# Patient Record
Sex: Female | Born: 1955 | Race: White | Hispanic: No | Smoking: Former smoker
Health system: Southern US, Community
[De-identification: ages and names within clinical notes are randomized; demographics above are authoritative.]

## PROBLEM LIST (undated history)

## (undated) DIAGNOSIS — I4891 Unspecified atrial fibrillation: Secondary | ICD-10-CM

## (undated) DIAGNOSIS — D649 Anemia, unspecified: Secondary | ICD-10-CM

## (undated) DIAGNOSIS — I499 Cardiac arrhythmia, unspecified: Secondary | ICD-10-CM

## (undated) DIAGNOSIS — J189 Pneumonia, unspecified organism: Secondary | ICD-10-CM

## (undated) DIAGNOSIS — I639 Cerebral infarction, unspecified: Secondary | ICD-10-CM

## (undated) DIAGNOSIS — K2980 Duodenitis without bleeding: Secondary | ICD-10-CM

## (undated) DIAGNOSIS — D509 Iron deficiency anemia, unspecified: Secondary | ICD-10-CM

## (undated) DIAGNOSIS — I2510 Atherosclerotic heart disease of native coronary artery without angina pectoris: Secondary | ICD-10-CM

## (undated) DIAGNOSIS — K2970 Gastritis, unspecified, without bleeding: Secondary | ICD-10-CM

## (undated) DIAGNOSIS — G4730 Sleep apnea, unspecified: Secondary | ICD-10-CM

## (undated) DIAGNOSIS — R221 Localized swelling, mass and lump, neck: Secondary | ICD-10-CM

## (undated) DIAGNOSIS — I700 Atherosclerosis of aorta: Secondary | ICD-10-CM

## (undated) DIAGNOSIS — I447 Left bundle-branch block, unspecified: Secondary | ICD-10-CM

## (undated) DIAGNOSIS — M1990 Unspecified osteoarthritis, unspecified site: Secondary | ICD-10-CM

## (undated) DIAGNOSIS — E785 Hyperlipidemia, unspecified: Secondary | ICD-10-CM

## (undated) DIAGNOSIS — I10 Essential (primary) hypertension: Secondary | ICD-10-CM

## (undated) HISTORY — DX: Duodenitis without bleeding: K29.80

## (undated) HISTORY — DX: Gastritis, unspecified, without bleeding: K29.70

## (undated) HISTORY — DX: Essential (primary) hypertension: I10

## (undated) HISTORY — PX: ABDOMINAL HYSTERECTOMY: SHX81

## (undated) HISTORY — PX: OTHER SURGICAL HISTORY: SHX169

## (undated) HISTORY — PX: CORONARY ANGIOPLASTY: SHX604

## (undated) HISTORY — PX: TOTAL ABDOMINAL HYSTERECTOMY W/ BILATERAL SALPINGOOPHORECTOMY: SHX83

## (undated) HISTORY — DX: Unspecified atrial fibrillation: I48.91

## (undated) HISTORY — DX: Cerebral infarction, unspecified: I63.9

## (undated) MED FILL — Iron Sucrose Inj 20 MG/ML (Fe Equiv): INTRAVENOUS | Qty: 10 | Status: AC

---

## 2006-09-27 HISTORY — PX: CARDIAC ELECTROPHYSIOLOGY STUDY AND ABLATION: SHX1294

## 2010-09-27 DIAGNOSIS — I639 Cerebral infarction, unspecified: Secondary | ICD-10-CM

## 2010-09-27 HISTORY — DX: Cerebral infarction, unspecified: I63.9

## 2012-11-20 DIAGNOSIS — E039 Hypothyroidism, unspecified: Secondary | ICD-10-CM | POA: Diagnosis not present

## 2013-05-23 DIAGNOSIS — R7989 Other specified abnormal findings of blood chemistry: Secondary | ICD-10-CM | POA: Diagnosis not present

## 2013-05-23 DIAGNOSIS — Z79899 Other long term (current) drug therapy: Secondary | ICD-10-CM | POA: Diagnosis not present

## 2013-05-23 DIAGNOSIS — I10 Essential (primary) hypertension: Secondary | ICD-10-CM | POA: Diagnosis not present

## 2013-05-23 DIAGNOSIS — E7800 Pure hypercholesterolemia, unspecified: Secondary | ICD-10-CM | POA: Diagnosis not present

## 2013-05-23 DIAGNOSIS — I4891 Unspecified atrial fibrillation: Secondary | ICD-10-CM | POA: Diagnosis not present

## 2013-06-05 DIAGNOSIS — M545 Low back pain, unspecified: Secondary | ICD-10-CM | POA: Diagnosis not present

## 2013-06-05 DIAGNOSIS — IMO0002 Reserved for concepts with insufficient information to code with codable children: Secondary | ICD-10-CM | POA: Diagnosis not present

## 2013-06-05 DIAGNOSIS — F17200 Nicotine dependence, unspecified, uncomplicated: Secondary | ICD-10-CM | POA: Diagnosis not present

## 2013-06-12 DIAGNOSIS — N390 Urinary tract infection, site not specified: Secondary | ICD-10-CM | POA: Diagnosis not present

## 2013-06-12 DIAGNOSIS — R350 Frequency of micturition: Secondary | ICD-10-CM | POA: Diagnosis not present

## 2013-07-03 DIAGNOSIS — M545 Low back pain, unspecified: Secondary | ICD-10-CM | POA: Diagnosis not present

## 2013-10-02 DIAGNOSIS — I10 Essential (primary) hypertension: Secondary | ICD-10-CM | POA: Diagnosis not present

## 2013-10-02 DIAGNOSIS — E785 Hyperlipidemia, unspecified: Secondary | ICD-10-CM | POA: Diagnosis not present

## 2013-10-02 DIAGNOSIS — I495 Sick sinus syndrome: Secondary | ICD-10-CM | POA: Diagnosis not present

## 2013-12-26 DIAGNOSIS — E785 Hyperlipidemia, unspecified: Secondary | ICD-10-CM | POA: Diagnosis not present

## 2014-03-28 DIAGNOSIS — I6350 Cerebral infarction due to unspecified occlusion or stenosis of unspecified cerebral artery: Secondary | ICD-10-CM | POA: Diagnosis not present

## 2014-03-28 DIAGNOSIS — I4891 Unspecified atrial fibrillation: Secondary | ICD-10-CM | POA: Diagnosis not present

## 2014-05-11 ENCOUNTER — Emergency Department: Payer: Self-pay | Admitting: Emergency Medicine

## 2014-05-11 DIAGNOSIS — R21 Rash and other nonspecific skin eruption: Secondary | ICD-10-CM | POA: Diagnosis not present

## 2014-05-11 DIAGNOSIS — B009 Herpesviral infection, unspecified: Secondary | ICD-10-CM | POA: Diagnosis not present

## 2014-05-11 DIAGNOSIS — B029 Zoster without complications: Secondary | ICD-10-CM | POA: Diagnosis not present

## 2014-05-11 DIAGNOSIS — I10 Essential (primary) hypertension: Secondary | ICD-10-CM | POA: Diagnosis not present

## 2014-07-11 DIAGNOSIS — I10 Essential (primary) hypertension: Secondary | ICD-10-CM | POA: Diagnosis not present

## 2014-07-11 DIAGNOSIS — I480 Paroxysmal atrial fibrillation: Secondary | ICD-10-CM | POA: Diagnosis not present

## 2014-07-11 DIAGNOSIS — I6930 Unspecified sequelae of cerebral infarction: Secondary | ICD-10-CM | POA: Insufficient documentation

## 2014-07-11 DIAGNOSIS — E785 Hyperlipidemia, unspecified: Secondary | ICD-10-CM | POA: Insufficient documentation

## 2014-07-11 DIAGNOSIS — I639 Cerebral infarction, unspecified: Secondary | ICD-10-CM | POA: Diagnosis not present

## 2014-07-11 DIAGNOSIS — I482 Chronic atrial fibrillation: Secondary | ICD-10-CM | POA: Diagnosis not present

## 2014-07-24 DIAGNOSIS — N390 Urinary tract infection, site not specified: Secondary | ICD-10-CM | POA: Diagnosis not present

## 2014-09-27 DIAGNOSIS — J189 Pneumonia, unspecified organism: Secondary | ICD-10-CM

## 2014-09-27 HISTORY — DX: Pneumonia, unspecified organism: J18.9

## 2014-11-29 DIAGNOSIS — R05 Cough: Secondary | ICD-10-CM | POA: Diagnosis not present

## 2014-11-29 DIAGNOSIS — J00 Acute nasopharyngitis [common cold]: Secondary | ICD-10-CM | POA: Diagnosis not present

## 2014-12-05 ENCOUNTER — Inpatient Hospital Stay: Payer: Self-pay | Admitting: Internal Medicine

## 2014-12-05 DIAGNOSIS — R0602 Shortness of breath: Secondary | ICD-10-CM | POA: Diagnosis not present

## 2014-12-05 DIAGNOSIS — B999 Unspecified infectious disease: Secondary | ICD-10-CM | POA: Diagnosis not present

## 2014-12-05 DIAGNOSIS — R509 Fever, unspecified: Secondary | ICD-10-CM | POA: Diagnosis not present

## 2014-12-05 DIAGNOSIS — Z8249 Family history of ischemic heart disease and other diseases of the circulatory system: Secondary | ICD-10-CM | POA: Diagnosis not present

## 2014-12-05 DIAGNOSIS — J441 Chronic obstructive pulmonary disease with (acute) exacerbation: Secondary | ICD-10-CM | POA: Diagnosis not present

## 2014-12-05 DIAGNOSIS — Z8673 Personal history of transient ischemic attack (TIA), and cerebral infarction without residual deficits: Secondary | ICD-10-CM | POA: Diagnosis not present

## 2014-12-05 DIAGNOSIS — J181 Lobar pneumonia, unspecified organism: Secondary | ICD-10-CM | POA: Diagnosis present

## 2014-12-05 DIAGNOSIS — E876 Hypokalemia: Secondary | ICD-10-CM | POA: Diagnosis not present

## 2014-12-05 DIAGNOSIS — J1008 Influenza due to other identified influenza virus with other specified pneumonia: Secondary | ICD-10-CM | POA: Diagnosis present

## 2014-12-05 DIAGNOSIS — I482 Chronic atrial fibrillation: Secondary | ICD-10-CM | POA: Diagnosis not present

## 2014-12-05 DIAGNOSIS — I4891 Unspecified atrial fibrillation: Secondary | ICD-10-CM | POA: Diagnosis not present

## 2014-12-05 DIAGNOSIS — J111 Influenza due to unidentified influenza virus with other respiratory manifestations: Secondary | ICD-10-CM | POA: Diagnosis not present

## 2014-12-05 DIAGNOSIS — J9601 Acute respiratory failure with hypoxia: Secondary | ICD-10-CM | POA: Diagnosis present

## 2014-12-05 DIAGNOSIS — E860 Dehydration: Secondary | ICD-10-CM | POA: Diagnosis present

## 2014-12-05 DIAGNOSIS — Z823 Family history of stroke: Secondary | ICD-10-CM | POA: Diagnosis not present

## 2014-12-05 DIAGNOSIS — E871 Hypo-osmolality and hyponatremia: Secondary | ICD-10-CM | POA: Diagnosis present

## 2014-12-05 DIAGNOSIS — R6510 Systemic inflammatory response syndrome (SIRS) of non-infectious origin without acute organ dysfunction: Secondary | ICD-10-CM | POA: Diagnosis not present

## 2014-12-05 DIAGNOSIS — Z7982 Long term (current) use of aspirin: Secondary | ICD-10-CM | POA: Diagnosis not present

## 2014-12-05 DIAGNOSIS — J189 Pneumonia, unspecified organism: Secondary | ICD-10-CM | POA: Diagnosis not present

## 2014-12-05 DIAGNOSIS — R0902 Hypoxemia: Secondary | ICD-10-CM | POA: Diagnosis not present

## 2014-12-05 DIAGNOSIS — I10 Essential (primary) hypertension: Secondary | ICD-10-CM | POA: Diagnosis present

## 2014-12-05 DIAGNOSIS — R05 Cough: Secondary | ICD-10-CM | POA: Diagnosis not present

## 2014-12-25 DIAGNOSIS — D696 Thrombocytopenia, unspecified: Secondary | ICD-10-CM | POA: Diagnosis not present

## 2014-12-25 DIAGNOSIS — J189 Pneumonia, unspecified organism: Secondary | ICD-10-CM | POA: Diagnosis not present

## 2014-12-25 DIAGNOSIS — Z8673 Personal history of transient ischemic attack (TIA), and cerebral infarction without residual deficits: Secondary | ICD-10-CM | POA: Diagnosis not present

## 2014-12-25 DIAGNOSIS — I482 Chronic atrial fibrillation: Secondary | ICD-10-CM | POA: Diagnosis not present

## 2014-12-25 DIAGNOSIS — I10 Essential (primary) hypertension: Secondary | ICD-10-CM | POA: Diagnosis not present

## 2014-12-26 DIAGNOSIS — J1100 Influenza due to unidentified influenza virus with unspecified type of pneumonia: Secondary | ICD-10-CM | POA: Diagnosis not present

## 2014-12-26 DIAGNOSIS — J189 Pneumonia, unspecified organism: Secondary | ICD-10-CM | POA: Diagnosis not present

## 2015-01-26 NOTE — H&P (Signed)
PATIENT NAME:  Kristina Henson, Kristina Henson MR#:  [id number] DATE OF BIRTH:  04/10/1956  DATE OF ADMISSION:  12/05/2014  PRIMARY CARE PHYSICIAN:  None.  PRIMARY CARDIOLOGIST: Javier Docker. Ubaldo Glassing, MD   The patient just moved into the area from [facility]. She is in the process of getting a primary care physician.   CHIEF COMPLAINT: Cough, fever, and not feeling well for the past 1 week. Kristina Henson is a 59 year old, Caucasian female with past medical history of atrial fibrillation, who has been off anticoagulation. She used to be on Xarelto. According to her, her cardiologist recommended her to be now on baby aspirin.   Along with history of TIA, shingles, comes to the Emergency Room with complaints of ongoing shortness of breath and cough with fever for the past 1 week. She was seen at urgent care this past Friday. Received Levaquin and prednisone taper. She reports completing the treatment, continued to have fever, came in with a fever of 101. Initially was tachycardic with heart rate in the 110s. She is being admitted with left-sided pneumonia. White count is 10.6. The patient received p.o. Levaquin 750 mg in the Emergency Room.   PAST MEDICAL HISTORY:  1. History of atrial fibrillation, now off Xarelto, on amiodarone and aspirin.  2. Transient ischemic attack.  3. Shingles.  4. Total hysterectomy.   ALLERGIES: No known drug allergies.   MEDICATIONS:  1. Just completed prednisone taper.  2. Lisinopril 20 mg daily.  3. Levaquin 750 mg p.o. daily. The patient has a couple of days left.  4. Hydralazine 50 mg b.i.d.  5. Tussionex 5 mL b.i.d. This was started last week.  6. Aspirin 81 mg daily.  7. Amiodarone 200 mg p.o. daily.   REVIEW OF SYSTEMS: CONSTITUTIONAL: Positive for fever, fatigue, weakness.  EYES: No blurred or double vision, glaucoma, or cataracts.  ENT: No tinnitus, ear pain, hearing loss.  RESPIRATORY: Positive for cough, shortness of breath. No hemoptysis or COPD.  CARDIOVASCULAR: No  chest pain, orthopnea or edema. Positive for atrial fibrillation.  GASTROINTESTINAL: No nausea, vomiting, diarrhea, abdominal pain. No GERD.  GENITOURINARY: No dysuria, hematuria, frequency.  ENDOCRINE: No polyuria, nocturia or thyroid problems.  HEMATOLOGY: No anemia or easy bruising.  SKIN: No acne, rash.  MUSCULOSKELETAL: No swelling, arthritis, cramps, or gout.  NEUROLOGIC: No CVA, TIA, dysarthria or dementia.  PSYCHIATRIC: No anxiety or depression.   All other systems reviewed and negative.   FAMILY HISTORY: Positive for hypertension and stroke.   SOCIAL HISTORY: He recently moved from [facility], lives with a friend. Nonsmoker, nonalcoholic. Denies any illicit drug use.   PHYSICAL EXAMINATION:  GENERAL: The patient is awake, alert, oriented x3, not in acute distress.  VITAL SIGNS: Temperature 101.7, pulse initially was 110, currently is 77. Blood pressure is 110/49, saturations are 96% on 2 liters.  HEENT: Atraumatic, normocephalic. PERLA. EOM intact. Oral mucosa is moist.  NECK: Supple. No JVD. No carotid bruit.  RESPIRATORY: Coarse breath sounds. No respiratory distress or labored breathing.  HEART: Both the heart sounds are normal. Rate, rhythm irregularly irregular. No murmur heard. PMI not lateralized.  ABDOMEN: Soft, benign, nontender. No organomegaly. Positive bowel sounds.  NEUROLOGIC: Grossly intact cranial nerves II through XII. No motor or sensory deficit.  PSYCHIATRIC: The patient is awake, alert, oriented x3.  SKIN: Warm and dry.   Chest x-ray shows patchy, reticular, nodular infiltrate in the left lung. Pneumonitis or pneumonia cannot be excluded.   White count is 10.6, hemoglobin and hematocrit 10.8  and 34.5. Troponin is 0.03. Sodium is 131, potassium is 3.3, chloride is 194, bicarbonate is 27, calcium is 8.5.   ASSESSMENT AND PLAN: Kristina Henson, 59 year old, with history of chronic atrial fibrillation and hypertension comes in with:  1.  Systemic inflammatory  response syndrome, secondary to pneumonia, left side. The patient was treated outpatient with Levaquin and p.o. steroid taper along with cough medicine; appears she failed outpatient treatment. She will be admitted to the medical floor. We will give her IV Rocephin and Zithromax. No indication for steroids at this time. She is not wheezing. We will give her scheduled Tussionex and p.r.n. Robitussin. DuoNebs as needed. We will send blood cultures and sputum cultures.  2.  Chronic atrial fibrillation. Rate appears controlled at this time. We will continue amiodarone and aspirin.  3.  History of hypertension; however, the patient currently has relative hypotension. Hold off on lisinopril and hydralazine.  4.  Hypokalemia, hyponatremia. Appears due to poor p.o. intake and mild dehydration. We will replete with IV fluids.  5.  Deep vein thrombosis prophylaxis. Subcutaneous heparin t.i.d.   The above was discussed with the patient. She is a Full Code. No family members present. Further workup according to the patient's clinical course.   TIME SPENT: 40 minutes    ____________________________ Gus Height A. Posey Pronto, MD sap:je D: 12/05/2014 13:47:41 ET T: 12/05/2014 14:35:13 ET JOB#: [id number]  cc: Isador Castille A. Posey Pronto, MD, <Dictator> Ilda Basset MD ELECTRONICALLY SIGNED 12/07/2014 14:31

## 2015-01-26 NOTE — Discharge Summary (Signed)
PATIENT NAME:  Kristina Henson, SCHOLLE MR#:  [id number] DATE OF BIRTH:  October 05, 1955  DATE OF ADMISSION:  12/05/2014 DATE OF DISCHARGE:  12/11/2014  PRESENTING COMPLAINT: Shortness of breath, fever.   DISCHARGE DIAGNOSES:  1. Hypoxia secondary to bilateral pneumonia.  2. Influenza.  3. History of chronic atrial fibrillation.  4. Saturations 81% on room air. 92 on 3 liters.  5. Home oxygen 3 liters nasal cannula has been set up.   CODE STATUS: Full code.   MEDICATIONS:  1. Amiodarone 200 mg p.o. daily.  2. Hydralazine 50 mg b.i.d.  3. Aspirin 81 mg daily.  4. Tussionex 5 mL b.i.d.  5. Lisinopril 20 mg p.o. daily.  6. Levofloxacin 500 mg 1 tablet p.o. daily.  7. Combivent inhaler 1 puff 4 times a day as needed.   FOLLOWUP:   1.  Follow up with Dr. Raul Del for hospital followup for pneumonia.  2.  Establish primary care with [facility].   DIAGNOSTIC STUDIES:   1.  Chest x-ray on discharge, radiographic regression of bilateral pneumonia and no new cardiopulmonary abnormality identified.  2.  Platelet count is 125,000.  3.  Glucose is 99, BUN is 18, creatinine is 0.71, sodium is 133.  4.  Sputum culture appears with many white blood cells.  5.  Influenza A plus B positive.  6.  Chest x-ray on admission shows bilateral pneumonia.  7.  White count on admission was 10.6.   HOSPITAL COURSE:  [name] is a 59 year old Caucasian female with chronic atrial fibrillation, hypertension, who presented to the hospital with shortness of breath, cough, and noted to be in acute respiratory failure. She was admitted with:   1.  Acute respiratory failure with hypoxia, likely due to flu complicated with superimposed bilateral pneumonia. The patient completed a course of Tamiflu while in the hospital. She was initially started on Rocephin and Zithromax, however her chest x-ray worsened with bilateral pneumonia and changed to Zosyn vancomycin. She will complete a course of Levaquin as an  outpatient for a total of 10 days.  2.  Antitussives were given. The patient received initially Solu-Medrol, however discontinued since there is no history of COPD or asthma.  3.  Incentive spirometer.   4.  Oxygen down to 3 liters per minute. Will need home oxygen.  5.  Flu.  Positive influenza A, completed Tamiflu.  6.  Pneumonia. Treatment as above.  7.  History of atrial fibrillation. Continued aspirin and amiodarone. 8.  Hypertension. Resume hydralazine and lisinopril.    Hospital stay otherwise remained stable. The patient remained a full code.   TIME SPENT: 40 minutes.    ____________________________ Hart Rochester Posey Pronto, MD sap:bu D: 12/12/2014 18:59:22 ET T: 12/12/2014 19:32:48 ET JOB#: [id number]  cc: Gifford Ballon A. Posey Pronto, MD, <Dictator> [facility] E. Raul Del, MD Ilda Basset MD ELECTRONICALLY SIGNED 12/30/2014 12:17

## 2015-01-27 DIAGNOSIS — S61200A Unspecified open wound of right index finger without damage to nail, initial encounter: Secondary | ICD-10-CM | POA: Diagnosis not present

## 2015-01-27 DIAGNOSIS — Z113 Encounter for screening for infections with a predominantly sexual mode of transmission: Secondary | ICD-10-CM | POA: Diagnosis not present

## 2015-01-27 DIAGNOSIS — Z23 Encounter for immunization: Secondary | ICD-10-CM | POA: Diagnosis not present

## 2015-01-27 DIAGNOSIS — E663 Overweight: Secondary | ICD-10-CM | POA: Diagnosis not present

## 2015-01-27 DIAGNOSIS — R350 Frequency of micturition: Secondary | ICD-10-CM | POA: Diagnosis not present

## 2015-01-27 DIAGNOSIS — Z0000 Encounter for general adult medical examination without abnormal findings: Secondary | ICD-10-CM | POA: Diagnosis not present

## 2015-01-27 DIAGNOSIS — I10 Essential (primary) hypertension: Secondary | ICD-10-CM | POA: Diagnosis not present

## 2015-01-29 ENCOUNTER — Other Ambulatory Visit: Payer: Self-pay | Admitting: Family Medicine

## 2015-01-29 DIAGNOSIS — N951 Menopausal and female climacteric states: Secondary | ICD-10-CM

## 2015-01-31 DIAGNOSIS — Z113 Encounter for screening for infections with a predominantly sexual mode of transmission: Secondary | ICD-10-CM | POA: Diagnosis not present

## 2015-01-31 DIAGNOSIS — R350 Frequency of micturition: Secondary | ICD-10-CM | POA: Diagnosis not present

## 2015-01-31 DIAGNOSIS — E663 Overweight: Secondary | ICD-10-CM | POA: Diagnosis not present

## 2015-01-31 DIAGNOSIS — Z0000 Encounter for general adult medical examination without abnormal findings: Secondary | ICD-10-CM | POA: Diagnosis not present

## 2015-01-31 DIAGNOSIS — I10 Essential (primary) hypertension: Secondary | ICD-10-CM | POA: Diagnosis not present

## 2015-01-31 LAB — LIPID PANEL
CHOLESTEROL: 211 mg/dL — AB (ref 0–200)
HDL: 65 mg/dL (ref 35–70)
LDL Cholesterol: 124 mg/dL
Triglycerides: 108 mg/dL (ref 40–160)

## 2015-01-31 LAB — HEPATIC FUNCTION PANEL
ALT: 18 U/L (ref 7–35)
AST: 19 U/L (ref 13–35)
Alkaline Phosphatase: 87 U/L (ref 25–125)
Bilirubin, Total: 0.4 mg/dL

## 2015-01-31 LAB — CBC AND DIFFERENTIAL
HEMATOCRIT: 33 % — AB (ref 36–46)
HEMOGLOBIN: 10.9 g/dL — AB (ref 12.0–16.0)
Platelets: 201 10*3/uL (ref 150–399)
WBC: 6.1 10*3/mL

## 2015-01-31 LAB — BASIC METABOLIC PANEL
BUN: 24 mg/dL — AB (ref 4–21)
Creatinine: 0.9 mg/dL (ref 0.5–1.1)
GLUCOSE: 95 mg/dL
POTASSIUM: 3.8 mmol/L (ref 3.4–5.3)
SODIUM: 140 mmol/L (ref 137–147)

## 2015-01-31 LAB — TSH: TSH: 1.39 u[IU]/mL (ref 0.41–5.90)

## 2015-02-03 DIAGNOSIS — Z79899 Other long term (current) drug therapy: Secondary | ICD-10-CM | POA: Diagnosis not present

## 2015-02-03 DIAGNOSIS — R938 Abnormal findings on diagnostic imaging of other specified body structures: Secondary | ICD-10-CM | POA: Diagnosis not present

## 2015-02-03 DIAGNOSIS — J189 Pneumonia, unspecified organism: Secondary | ICD-10-CM | POA: Diagnosis not present

## 2015-02-07 DIAGNOSIS — Z79899 Other long term (current) drug therapy: Secondary | ICD-10-CM | POA: Diagnosis not present

## 2015-02-07 DIAGNOSIS — D649 Anemia, unspecified: Secondary | ICD-10-CM | POA: Diagnosis not present

## 2015-02-07 DIAGNOSIS — R5383 Other fatigue: Secondary | ICD-10-CM | POA: Diagnosis not present

## 2015-02-18 DIAGNOSIS — I10 Essential (primary) hypertension: Secondary | ICD-10-CM | POA: Diagnosis not present

## 2015-02-18 DIAGNOSIS — I4891 Unspecified atrial fibrillation: Secondary | ICD-10-CM | POA: Diagnosis not present

## 2015-02-18 DIAGNOSIS — E782 Mixed hyperlipidemia: Secondary | ICD-10-CM | POA: Diagnosis not present

## 2015-02-21 LAB — FECAL OCCULT BLOOD, GUAIAC: Fecal Occult Blood: NEGATIVE

## 2015-04-18 ENCOUNTER — Telehealth: Payer: Self-pay | Admitting: Family Medicine

## 2015-04-18 NOTE — Telephone Encounter (Signed)
Notified patient that Dr.Johnson is out town. Told her that if she gets worse to go to Urgent Care. She wanted advice on what to take for these symptoms, I told her to speak with her pharmacist, that they would know what will work with her prescription medications.

## 2015-04-18 NOTE — Telephone Encounter (Signed)
Pt called stated she has developed a cold and does not want it to turn into pneumonia. Wants to know if MJ can call something in for her. Pt informed an appt would most likely be needed. Pharm is Paediatric nurse on [address] Thanks

## 2015-05-07 DIAGNOSIS — J129 Viral pneumonia, unspecified: Secondary | ICD-10-CM | POA: Diagnosis not present

## 2015-05-07 DIAGNOSIS — R05 Cough: Secondary | ICD-10-CM | POA: Diagnosis not present

## 2015-06-25 ENCOUNTER — Ambulatory Visit (INDEPENDENT_AMBULATORY_CARE_PROVIDER_SITE_OTHER): Payer: Medicare Other | Admitting: Family Medicine

## 2015-06-25 ENCOUNTER — Ambulatory Visit: Payer: Medicare Other

## 2015-06-25 ENCOUNTER — Ambulatory Visit
Admission: RE | Admit: 2015-06-25 | Discharge: 2015-06-25 | Disposition: A | Discharge status: Home / Self Care | Payer: Medicare Other | Source: Ambulatory Visit | Attending: Family Medicine | Admitting: Family Medicine

## 2015-06-25 ENCOUNTER — Other Ambulatory Visit: Payer: Self-pay | Admitting: Family Medicine

## 2015-06-25 ENCOUNTER — Encounter: Payer: Self-pay | Admitting: Family Medicine

## 2015-06-25 VITALS — BP 168/100 | HR 54 | Temp 97.5°F | Ht 61.25 in | Wt 167.4 lb

## 2015-06-25 DIAGNOSIS — I4891 Unspecified atrial fibrillation: Secondary | ICD-10-CM | POA: Insufficient documentation

## 2015-06-25 DIAGNOSIS — M25552 Pain in left hip: Secondary | ICD-10-CM

## 2015-06-25 DIAGNOSIS — N3000 Acute cystitis without hematuria: Secondary | ICD-10-CM

## 2015-06-25 DIAGNOSIS — Z23 Encounter for immunization: Secondary | ICD-10-CM | POA: Diagnosis not present

## 2015-06-25 DIAGNOSIS — I129 Hypertensive chronic kidney disease with stage 1 through stage 4 chronic kidney disease, or unspecified chronic kidney disease: Secondary | ICD-10-CM | POA: Insufficient documentation

## 2015-06-25 DIAGNOSIS — I10 Essential (primary) hypertension: Secondary | ICD-10-CM | POA: Diagnosis not present

## 2015-06-25 DIAGNOSIS — Z1211 Encounter for screening for malignant neoplasm of colon: Secondary | ICD-10-CM | POA: Diagnosis not present

## 2015-06-25 DIAGNOSIS — Z1231 Encounter for screening mammogram for malignant neoplasm of breast: Secondary | ICD-10-CM

## 2015-06-25 DIAGNOSIS — R300 Dysuria: Secondary | ICD-10-CM | POA: Insufficient documentation

## 2015-06-25 LAB — MICROSCOPIC EXAMINATION: Renal Epithel, UA: NONE SEEN /hpf

## 2015-06-25 MED ORDER — CYCLOBENZAPRINE HCL 10 MG PO TABS: 10.0000 mg | ORAL_TABLET | Freq: Every day | ORAL | Status: DC

## 2015-06-25 MED ORDER — NITROFURANTOIN MONOHYD MACRO 100 MG PO CAPS: 100.0000 mg | ORAL_CAPSULE | Freq: Two times a day (BID) | ORAL | Status: DC

## 2015-06-25 MED ORDER — MELOXICAM 15 MG PO TABS: 15.0000 mg | ORAL_TABLET | Freq: Every day | ORAL | Status: DC

## 2015-06-25 NOTE — Assessment & Plan Note (Signed)
BP elevated again today. Only took 1 of her lisinopril today. Advised her to take her medicine as prescribed and work on [employer]. Continue to monitor. Recheck in 1 month.

## 2015-06-25 NOTE — Progress Notes (Signed)
BP 168/100 mmHg  Pulse 54  Temp(Src) 97.5 F (36.4 C)  Ht 5' 1.25" (1.556 m)  Wt 167 lb 6.4 oz (75.932 kg)  BMI 31.36 kg/m2  SpO2 96%  LMP  (Exact Date)   Subjective:    Patient ID: Kristina Henson, female    DOB: [birth year], 59 y.o.   MRN: [id number]  HPI: Kristina Henson is a 59 y.o. female  Chief Complaint  Patient presents with  . Leg Pain    Pt slipped in the floor at [employer] and landed on her left/hip. And pt thinks she has a possible bladder infection.   HYPERTENSION- saw cardiology, they did not change her blood pressure medicine, took her medicine today, seeing them again in November  Hypertension status: uncontrolled  Satisfied with current treatment? yes Duration of hypertension: chronic BP monitoring frequency:  not checking BP medication side effects:  no Medication compliance: good compliance Aspirin: no Recurrent headaches: no Visual changes: no Palpitations: no Dyspnea: no Chest pain: no Lower extremity edema: no Dizzy/lightheaded: no  URINARY SYMPTOMS Duration: several weeks Dysuria: no Urinary frequency: yes Urgency: yes Small volume voids: no Symptom severity: mild Urinary incontinence: no Foul odor: yes Hematuria: no Abdominal pain: no Back pain: no Suprapubic pain/pressure: no Flank pain: no Fever:  no Vomiting: no Treatments attempted: none   HIP PAIN- Sunday fell on water at [facility] and fell on her bottom. Got a couple of minutes to get up. Did not hurt right away. Now still hurting.  Duration: days Involved hip: left  Mechanism of injury: trauma Location: anterior Onset: sudden  Severity: 6/10  Quality: sharp Frequency: intermittent- with walking Radiation: no Aggravating factors: weight bearing, walking, running, stairs and movement   Alleviating factors: hasn't tried anything  Status: stable Treatments attempted: ibuprofen  2 pills 1x Relief with NSAIDs?: no Weakness with weight bearing: no Weakness with  walking: no Paresthesias / decreased sensation: no Swelling: no Redness:no Fevers: no  Relevant past medical, surgical, family and social history reviewed and updated as indicated. Interim medical history since our last visit reviewed. Allergies and medications reviewed and updated.  Review of Systems  Constitutional: Negative.   Respiratory: Negative.   Cardiovascular: Negative.   Musculoskeletal: Positive for myalgias, back pain, arthralgias and gait problem. Negative for joint swelling, neck pain and neck stiffness.  Psychiatric/Behavioral: Negative.    Per HPI unless specifically indicated above     Objective:    BP 168/100 mmHg  Pulse 54  Temp(Src) 97.5 F (36.4 C)  Ht 5' 1.25" (1.556 m)  Wt 167 lb 6.4 oz (75.932 kg)  BMI 31.36 kg/m2  SpO2 96%  LMP  (Exact Date)  Wt Readings from Last 3 Encounters:  06/25/15 167 lb 6.4 oz (75.932 kg)  01/27/15 156 lb (70.761 kg)    Physical Exam  Constitutional: She is oriented to person, place, and time. She appears well-developed and well-nourished. No distress.  HENT:  Head: Normocephalic and atraumatic.  Right Ear: Hearing normal.  Left Ear: Hearing normal.  Nose: Nose normal.  Eyes: Conjunctivae and lids are normal. Right eye exhibits no discharge. Left eye exhibits no discharge. No scleral icterus.  Cardiovascular: Normal rate, regular rhythm, normal heart sounds and intact distal pulses.  Exam reveals no gallop and no friction rub.   No murmur heard. Pulmonary/Chest: Effort normal and breath sounds normal. No respiratory distress. She has no wheezes. She has no rales. She exhibits no tenderness.  Musculoskeletal: She exhibits no edema.  Neurological: She is  alert and oriented to person, place, and time.  Skin: Skin is warm, dry and intact. No rash noted. No erythema. No pallor.  Psychiatric: She has a normal mood and affect. Her speech is normal and behavior is normal. Judgment and thought content normal. Cognition and  memory are normal.  Nursing note and vitals reviewed. Hip Exam: Left    Tenderness to palpation:      Greater trochanter: no     Anterior superior iliac spine: yes     Anterior hip: yes     Iliac crest: yes     Iliac tubercle: no     Pubic tubercle: no     SI joint: no     Range of Motion: Full ROM    Muscle Strength:  5/5 bilaterally    Special Tests:    Ober test: positive    FABER test:positive  Results for orders placed or performed in visit on 06/25/15  Microscopic Examination  Result Value Ref Range   WBC, UA 0-5 0 -  5 /hpf   RBC, UA 0-2 0 -  2 /hpf   Epithelial Cells (non renal) 0-10 0 - 10 /hpf   Renal Epithel, UA None seen None seen /hpf   Casts Present (A) None seen /lpf   Cast Type Hyaline casts N/A   Mucus, UA Present Not Estab.   Bacteria, UA Few None seen/Few  UA/M w/rflx Culture, Routine  Result Value Ref Range   Specific Gravity, UA 1.020 1.005 - 1.030   pH, UA 5.5 5.0 - 7.5   Color, UA Yellow Yellow   Appearance Ur Cloudy (A) Clear   Leukocytes, UA 1+ (A) Negative   Protein, UA Trace Negative/Trace   Glucose, UA Negative Negative   Ketones, UA Negative Negative   RBC, UA Negative Negative   Bilirubin, UA Negative Negative   Urobilinogen, Ur 0.2 0.2 - 1.0 mg/dL   Nitrite, UA Negative Negative   Microscopic Examination See below:    Urinalysis Reflex Comment       Assessment & Plan:   Problem List Items Addressed This Visit      Cardiovascular and Mediastinum   Hypertension    BP elevated again today. Only took 1 of her lisinopril today. Advised her to take her medicine as prescribed and work on [employer]. Continue to monitor. Recheck in 1 month.       Relevant Medications   aspirin EC 81 MG tablet     Other   Dysuria   Relevant Orders   UA/M w/rflx Culture, Routine (Completed)    Other Visit Diagnoses    Hip pain, left    -  Primary    S/p trauma with significant tenderness. Will check x-ray today. Tx with meloxicam and flexeril for  pain and inflammation. Monitor and recheck in max 1 month    Relevant Orders    DG HIP UNILAT WITH PELVIS 2-3 VIEWS LEFT    Need for influenza vaccination        Flu given today.     Relevant Orders    Flu Vaccine QUAD 36+ mos PF IM (Fluarix & Fluzone Quad PF) (Completed)    Screening for colon cancer        Ordered today.     Relevant Orders    Ambulatory referral to General Surgery    Acute cystitis without hematuria        Will treat with nitrofurantoin as she is on amiodarone. She will let us know  if not getting better or getting worse. Continue to monitor.         Follow up plan: Return in about 4 weeks (around 07/23/2015).

## 2015-06-25 NOTE — Patient Instructions (Signed)
Hip Pain Your hip is the joint between your upper legs and your lower pelvis. The bones, cartilage, tendons, and muscles of your hip joint perform a lot of work each day supporting your body weight and allowing you to move around. Hip pain can range from a minor ache to severe pain in one or both of your hips. Pain may be felt on the inside of the hip joint near the groin, or the outside near the buttocks and upper thigh. You may have swelling or stiffness as well.  HOME CARE INSTRUCTIONS   Take medicines only as directed by your health care provider.  Apply ice to the injured area:  Put ice in a plastic bag.  Place a towel between your skin and the bag.  Leave the ice on for 15-20 minutes at a time, 3-4 times a day.  Keep your leg raised (elevated) when possible to lessen swelling.  Avoid activities that cause pain.  Follow specific exercises as directed by your health care provider.  Sleep with a pillow between your legs on your most comfortable side.  Record how often you have hip pain, the location of the pain, and what it feels like. SEEK MEDICAL CARE IF:   You are unable to put weight on your leg.  Your hip is red or swollen or very tender to touch.  Your pain or swelling continues or worsens after 1 week.  You have increasing difficulty walking.  You have a fever. SEEK IMMEDIATE MEDICAL CARE IF:   You have fallen.  You have a sudden increase in pain and swelling in your hip. MAKE SURE YOU:   Understand these instructions.  Will watch your condition.  Will get help right away if you are not doing well or get worse. Document Released: 03/03/2010 Document Revised: 01/28/2014 Document Reviewed: 05/10/2013 [facility] Patient Information 2015 [place], [place]. This information is not intended to replace advice given to you by your health care provider. Make sure you discuss any questions you have with your health care provider. DASH Eating Plan DASH stands for "Dietary  Approaches to Stop Hypertension." The DASH eating plan is a healthy eating plan that has been shown to reduce high blood pressure (hypertension). Additional health benefits may include reducing the risk of type 2 diabetes mellitus, heart disease, and stroke. The DASH eating plan may also help with weight loss. WHAT DO I NEED TO KNOW ABOUT THE DASH EATING PLAN? For the DASH eating plan, you will follow these general guidelines:  Choose foods with a percent daily value for sodium of less than 5% (as listed on the food label).  Use salt-free seasonings or herbs instead of table salt or sea salt.  Check with your health care provider or pharmacist before using salt substitutes.  Eat lower-sodium products, often labeled as "lower sodium" or "no salt added."  Eat fresh foods.  Eat more vegetables, fruits, and low-fat dairy products.  Choose whole grains. Look for the word "whole" as the first word in the ingredient list.  Choose fish and skinless chicken or [place] more often than red meat. Limit fish, poultry, and meat to 6 oz (170 g) each day.  Limit sweets, desserts, sugars, and sugary drinks.  Choose heart-healthy fats.  Limit cheese to 1 oz (28 g) per day.  Eat more home-cooked food and less restaurant, buffet, and fast food.  Limit fried foods.  Cook foods using methods other than frying.  Limit canned vegetables. If you do use them, rinse them  well to decrease the sodium.  When eating at a restaurant, ask that your food be prepared with less salt, or no salt if possible. WHAT FOODS CAN I EAT? Seek help from a dietitian for individual calorie needs. Grains Whole grain or whole wheat bread. Brown rice. Whole grain or whole wheat pasta. Quinoa, bulgur, and whole grain cereals. Low-sodium cereals. Corn or whole wheat flour tortillas. Whole grain cornbread. Whole grain crackers. Low-sodium crackers. Vegetables Fresh or frozen vegetables (raw, steamed, roasted, or grilled).  Low-sodium or reduced-sodium tomato and vegetable juices. Low-sodium or reduced-sodium tomato sauce and paste. Low-sodium or reduced-sodium canned vegetables.  Fruits All fresh, canned (in natural juice), or frozen fruits. Meat and Other Protein Products Ground beef (85% or leaner), grass-fed beef, or beef trimmed of fat. Skinless chicken or [place]. Ground chicken or [place]. Pork trimmed of fat. All fish and seafood. Eggs. Dried beans, peas, or lentils. Unsalted nuts and seeds. Unsalted canned beans. Dairy Low-fat dairy products, such as skim or 1% milk, 2% or reduced-fat cheeses, low-fat ricotta or cottage cheese, or plain low-fat yogurt. Low-sodium or reduced-sodium cheeses. Fats and Oils Tub margarines without trans fats. Light or reduced-fat mayonnaise and salad dressings (reduced sodium). Avocado. Safflower, olive, or canola oils. Natural peanut or almond butter. Other Unsalted popcorn and pretzels. The items listed above may not be a complete list of recommended foods or beverages. Contact your dietitian for more options. WHAT FOODS ARE NOT RECOMMENDED? Grains White bread. White pasta. White rice. Refined cornbread. Bagels and croissants. Crackers that contain trans fat. Vegetables Creamed or fried vegetables. Vegetables in a cheese sauce. Regular canned vegetables. Regular canned tomato sauce and paste. Regular tomato and vegetable juices. Fruits Dried fruits. Canned fruit in light or heavy syrup. Fruit juice. Meat and Other Protein Products Fatty cuts of meat. Ribs, chicken wings, bacon, sausage, bologna, salami, chitterlings, fatback, hot dogs, bratwurst, and packaged luncheon meats. Salted nuts and seeds. Canned beans with salt. Dairy Whole or 2% milk, cream, half-and-half, and cream cheese. Whole-fat or sweetened yogurt. Full-fat cheeses or blue cheese. Nondairy creamers and whipped toppings. Processed cheese, cheese spreads, or cheese curds. Condiments Onion and garlic salt,  seasoned salt, table salt, and sea salt. Canned and packaged gravies. Worcestershire sauce. Tartar sauce. Barbecue sauce. Teriyaki sauce. Soy sauce, including reduced sodium. Steak sauce. Fish sauce. Oyster sauce. Cocktail sauce. Horseradish. Ketchup and mustard. Meat flavorings and tenderizers. Bouillon cubes. Hot sauce. Tabasco sauce. Marinades. Taco seasonings. Relishes. Fats and Oils Butter, stick margarine, lard, shortening, ghee, and bacon fat. Coconut, palm kernel, or palm oils. Regular salad dressings. Other Pickles and olives. Salted popcorn and pretzels. The items listed above may not be a complete list of foods and beverages to avoid. Contact your dietitian for more information. WHERE CAN I FIND MORE INFORMATION? National Heart, Lung, and Blood Institute: travelstabloid.com Document Released: 09/02/2011 Document Revised: 01/28/2014 Document Reviewed: 07/18/2013 [facility] Patient Information 2015 [place], [place]. This information is not intended to replace advice given to you by your health care provider. Make sure you discuss any questions you have with your health care provider.

## 2015-06-26 ENCOUNTER — Telehealth: Payer: Self-pay | Admitting: Family Medicine

## 2015-06-26 NOTE — Telephone Encounter (Signed)
Called patient and gave her results of normal x-ray. She is concerned about her BP, but notes that she has been eating salt. Will stop salt and we will recheck at follow up.

## 2015-06-27 LAB — UA/M W/RFLX CULTURE, ROUTINE: ORGANISM ID, BACTERIA: NO GROWTH

## 2015-07-02 ENCOUNTER — Ambulatory Visit
Admission: RE | Admit: 2015-07-02 | Discharge: 2015-07-02 | Disposition: A | Discharge status: Home / Self Care | Payer: Medicare Other | Source: Ambulatory Visit | Attending: Family Medicine | Admitting: Family Medicine

## 2015-07-02 ENCOUNTER — Other Ambulatory Visit: Payer: Self-pay | Admitting: Family Medicine

## 2015-07-02 ENCOUNTER — Encounter: Payer: Self-pay | Admitting: Family Medicine

## 2015-07-02 DIAGNOSIS — Z90710 Acquired absence of both cervix and uterus: Secondary | ICD-10-CM | POA: Diagnosis not present

## 2015-07-02 DIAGNOSIS — N951 Menopausal and female climacteric states: Secondary | ICD-10-CM | POA: Insufficient documentation

## 2015-07-02 DIAGNOSIS — R922 Inconclusive mammogram: Secondary | ICD-10-CM | POA: Diagnosis not present

## 2015-07-02 DIAGNOSIS — Z780 Asymptomatic menopausal state: Secondary | ICD-10-CM | POA: Insufficient documentation

## 2015-07-02 DIAGNOSIS — Z1231 Encounter for screening mammogram for malignant neoplasm of breast: Secondary | ICD-10-CM

## 2015-07-02 DIAGNOSIS — Z1213 Encounter for screening for malignant neoplasm of small intestine: Secondary | ICD-10-CM | POA: Diagnosis present

## 2015-07-03 ENCOUNTER — Telehealth: Payer: Self-pay

## 2015-07-03 ENCOUNTER — Other Ambulatory Visit: Payer: Self-pay

## 2015-07-03 NOTE — Telephone Encounter (Signed)
Gastroenterology Pre-Procedure Review  Request Date:  Requesting Physician: Park Liter, DO  PATIENT REVIEW QUESTIONS: The patient responded to the following health history questions as indicated:    1. Are you having any GI issues? no 2. Do you have a personal history of Polyps? no 3. Do you have a family history of Colon Cancer or Polyps? no 4. Diabetes Mellitus? no 5. Joint replacements in the past 12 months?no 6. Major health problems in the past 3 months?no 7. Any artificial heart valves, MVP, or defibrillator?no    MEDICATIONS & ALLERGIES:    Patient reports the following regarding taking any anticoagulation/antiplatelet therapy:   Plavix, Coumadin, Eliquis, Xarelto, Lovenox, Pradaxa, Brilinta, or Effient? no Aspirin? yes (ASA 81mg )  Patient confirms/reports the following medications:  Current Outpatient Prescriptions  Medication Sig Dispense Refill  . amiodarone (PACERONE) 200 MG tablet Take 200 mg by mouth daily.    Marland Kitchen aspirin EC 81 MG tablet Take by mouth.    . cyclobenzaprine (FLEXERIL) 10 MG tablet Take 1 tablet (10 mg total) by mouth at bedtime. 30 tablet 0  . ferrous sulfate 325 (65 FE) MG EC tablet Take by mouth.    . hydrALAZINE (APRESOLINE) 50 MG tablet Take 50 mg by mouth 2 (two) times daily.    Marland Kitchen lisinopril (PRINIVIL,ZESTRIL) 20 MG tablet Take 20 mg by mouth 2 (two) times daily.    . meloxicam (MOBIC) 15 MG tablet Take 1 tablet (15 mg total) by mouth daily. 30 tablet 0  . nitrofurantoin, macrocrystal-monohydrate, (MACROBID) 100 MG capsule Take 1 capsule (100 mg total) by mouth 2 (two) times daily. 14 capsule 0   No current facility-administered medications for this visit.    Patient confirms/reports the following allergies:  No Known Allergies  No orders of the defined types were placed in this encounter.    AUTHORIZATION INFORMATION Primary Insurance: 1D#: Group #:  Secondary Insurance: 1D#: Group #:  SCHEDULE INFORMATION: Date:  10/07/15 Time: Location: [place]

## 2015-07-04 ENCOUNTER — Other Ambulatory Visit: Payer: Self-pay | Admitting: Family Medicine

## 2015-07-04 DIAGNOSIS — R928 Other abnormal and inconclusive findings on diagnostic imaging of breast: Secondary | ICD-10-CM

## 2015-07-21 ENCOUNTER — Ambulatory Visit
Admission: RE | Admit: 2015-07-21 | Discharge: 2015-07-21 | Disposition: A | Discharge status: Home / Self Care | Payer: Medicare Other | Source: Ambulatory Visit | Attending: Family Medicine | Admitting: Family Medicine

## 2015-07-21 ENCOUNTER — Telehealth: Payer: Self-pay | Admitting: Family Medicine

## 2015-07-21 DIAGNOSIS — R928 Other abnormal and inconclusive findings on diagnostic imaging of breast: Secondary | ICD-10-CM

## 2015-07-21 DIAGNOSIS — R922 Inconclusive mammogram: Secondary | ICD-10-CM | POA: Insufficient documentation

## 2015-07-21 NOTE — Telephone Encounter (Signed)
Called patient to let her know that her mammogram came back normal. She was already aware.

## 2015-07-29 ENCOUNTER — Ambulatory Visit (INDEPENDENT_AMBULATORY_CARE_PROVIDER_SITE_OTHER): Payer: Medicare Other | Admitting: Family Medicine

## 2015-07-29 ENCOUNTER — Encounter: Payer: Self-pay | Admitting: Family Medicine

## 2015-07-29 VITALS — BP 156/80 | HR 72 | Temp 98.4°F | Ht 62.0 in | Wt 173.0 lb

## 2015-07-29 DIAGNOSIS — Z79899 Other long term (current) drug therapy: Secondary | ICD-10-CM

## 2015-07-29 DIAGNOSIS — I10 Essential (primary) hypertension: Secondary | ICD-10-CM

## 2015-07-29 DIAGNOSIS — Z23 Encounter for immunization: Secondary | ICD-10-CM | POA: Diagnosis not present

## 2015-07-29 MED ORDER — HYDROCHLOROTHIAZIDE 25 MG PO TABS: 25.0000 mg | ORAL_TABLET | Freq: Every day | ORAL | Status: DC

## 2015-07-29 NOTE — Assessment & Plan Note (Signed)
Worse on recheck. Possibly due to meloxicam. Will start HCTZ and recheck BP in 1 month. Continue to monitor. To see cardiology on 11/30.

## 2015-07-29 NOTE — Progress Notes (Signed)
BP 156/80 mmHg  Pulse 72  Temp(Src) 98.4 F (36.9 C)  Ht 5\' 2"  (1.575 m)  Wt 173 lb (78.472 kg)  BMI 31.63 kg/m2  SpO2 99%  LMP  (Exact Date)   Subjective:    Patient ID: Kristina Henson, female    DOB: 08/11/1956, 59 y.o.   MRN: [id number]  HPI: Kristina Henson is a 59 y.o. female  Chief Complaint  Patient presents with  . Hypertension    1 month fuv   HYPERTENSION- has been taking 2 of her lisinopril  Hypertension status: better  Satisfied with current treatment? yes Duration of hypertension: chronic BP monitoring frequency:  not checking BP medication side effects:  no Medication compliance: excellent compliance Aspirin: no Recurrent headaches: no Visual changes: no Palpitations: no Dyspnea: no Chest pain: no Lower extremity edema: no Dizzy/lightheaded: no  Relevant past medical, surgical, family and social history reviewed and updated as indicated. Interim medical history since our last visit reviewed. Allergies and medications reviewed and updated.  Review of Systems  Constitutional: Negative.   Respiratory: Negative.   Cardiovascular: Negative.   Psychiatric/Behavioral: Negative.     Per HPI unless specifically indicated above     Objective:    BP 156/80 mmHg  Pulse 72  Temp(Src) 98.4 F (36.9 C)  Ht 5\' 2"  (1.575 m)  Wt 173 lb (78.472 kg)  BMI 31.63 kg/m2  SpO2 99%  LMP  (Exact Date)  Wt Readings from Last 3 Encounters:  07/29/15 173 lb (78.472 kg)  06/25/15 167 lb 6.4 oz (75.932 kg)  01/27/15 156 lb (70.761 kg)    Physical Exam  Constitutional: She is oriented to person, place, and time. She appears well-developed and well-nourished. No distress.  HENT:  Head: Normocephalic and atraumatic.  Right Ear: Hearing normal.  Left Ear: Hearing normal.  Nose: Nose normal.  Eyes: Conjunctivae and lids are normal. Right eye exhibits no discharge. Left eye exhibits no discharge. No scleral icterus.  Cardiovascular: Normal rate, regular rhythm,  normal heart sounds and intact distal pulses.  Exam reveals no gallop and no friction rub.   No murmur heard. Pulmonary/Chest: Effort normal and breath sounds normal. No respiratory distress. She has no wheezes. She has no rales. She exhibits no tenderness.  Musculoskeletal: Normal range of motion.  Neurological: She is alert and oriented to person, place, and time.  Skin: Skin is warm, dry and intact. No rash noted. No erythema. No pallor.  Psychiatric: She has a normal mood and affect. Her speech is normal and behavior is normal. Judgment and thought content normal. Cognition and memory are normal.  Nursing note and vitals reviewed.   Results for orders placed or performed in visit on 06/25/15  Microscopic Examination  Result Value Ref Range   WBC, UA 0-5 0 -  5 /hpf   RBC, UA 0-2 0 -  2 /hpf   Epithelial Cells (non renal) 0-10 0 - 10 /hpf   Renal Epithel, UA None seen None seen /hpf   Casts Present (A) None seen /lpf   Cast Type Hyaline casts N/A   Mucus, UA Present Not Estab.   Bacteria, UA Few None seen/Few  UA/M w/rflx Culture, Routine  Result Value Ref Range   Urine Culture, Routine Final report    Urine Culture result 1 No growth       Assessment & Plan:   Problem List Items Addressed This Visit      Cardiovascular and Mediastinum   Hypertension - Primary  Worse on recheck. Possibly due to meloxicam. Will start HCTZ and recheck BP in 1 month. Continue to monitor. To see cardiology on 11/30.       Relevant Medications   hydrochlorothiazide (HYDRODIURIL) 25 MG tablet    Other Visit Diagnoses    Immunization due        Relevant Orders    Pneumococcal polysaccharide vaccine 23-valent greater than or equal to 2yo subcutaneous/IM        Follow up plan: No Follow-up on file.

## 2015-07-29 NOTE — Patient Instructions (Signed)
Exercising to Lose Weight Exercising can help you to lose weight. In order to lose weight through exercise, you need to do vigorous-intensity exercise. You can tell that you are exercising with vigorous intensity if you are breathing very hard and fast and cannot hold a conversation while exercising. Moderate-intensity exercise helps to maintain your current weight. You can tell that you are exercising at a moderate level if you have a higher heart rate and faster breathing, but you are still able to hold a conversation. HOW OFTEN SHOULD I EXERCISE? Choose an activity that you enjoy and set realistic goals. Your health care provider can help you to make an activity plan that works for you. Exercise regularly as directed by your health care provider. This may include:  Doing resistance training twice each week, such as:  Push-ups.  Sit-ups.  Lifting weights.  Using resistance bands.  Doing a given intensity of exercise for a given amount of time. Choose from these options:  150 minutes of moderate-intensity exercise every week.  75 minutes of vigorous-intensity exercise every week.  A mix of moderate-intensity and vigorous-intensity exercise every week. Children, pregnant women, people who are out of shape, people who are overweight, and older adults may need to consult a health care provider for individual recommendations. If you have any sort of medical condition, be sure to consult your health care provider before starting a new exercise program. WHAT ARE SOME ACTIVITIES THAT CAN HELP ME TO LOSE WEIGHT?   Walking at a rate of at least 4.5 miles an hour.  Jogging or running at a rate of 5 miles per hour.  Biking at a rate of at least 10 miles per hour.  Lap swimming.  Roller-skating or in-line skating.  Cross-country skiing.  Vigorous competitive sports, such as football, basketball, and soccer.  Jumping rope.  Aerobic dancing. HOW CAN I BE MORE ACTIVE IN MY DAY-TO-DAY  ACTIVITIES?  Use the stairs instead of the elevator.  Take a walk during your lunch break.  If you drive, park your car farther away from work or school.  If you take public transportation, get off one stop early and walk the rest of the way.  Make all of your phone calls while standing up and walking around.  Get up, stretch, and walk around every 30 minutes throughout the day. WHAT GUIDELINES SHOULD I FOLLOW WHILE EXERCISING?  Do not exercise so much that you hurt yourself, feel dizzy, or get very short of breath.  Consult your health care provider prior to starting a new exercise program.  Wear comfortable clothes and shoes with good support.  Drink plenty of water while you exercise to prevent dehydration or heat stroke. Body water is lost during exercise and must be replaced.  Work out until you breathe faster and your heart beats faster.   This information is not intended to replace advice given to you by your health care provider. Make sure you discuss any questions you have with your health care provider.   Document Released: 10/16/2010 Document Revised: 10/04/2014 Document Reviewed: 02/14/2014 Elsevier Interactive Patient Education 2016 Elsevier Inc. Hydrochlorothiazide, HCTZ capsules or tablets What is this medicine? HYDROCHLOROTHIAZIDE (hye droe klor oh THYE a zide) is a diuretic. It increases the amount of urine passed, which causes the body to lose salt and water. This medicine is used to treat high blood pressure. It is also reduces the swelling and water retention caused by various medical conditions, such as heart, liver, or kidney disease. This  medicine may be used for other purposes; ask your health care provider or pharmacist if you have questions. What should I tell my health care provider before I take this medicine? They need to know if you have any of these conditions: -diabetes -gout -immune system problems, like lupus -kidney disease or kidney  stones -liver disease -pancreatitis -small amount of urine or difficulty passing urine -an unusual or allergic reaction to hydrochlorothiazide, sulfa drugs, other medicines, foods, dyes, or preservatives -pregnant or trying to get pregnant -breast-feeding How should I use this medicine? Take this medicine by mouth with a glass of water. Follow the directions on the prescription label. Take your medicine at regular intervals. Remember that you will need to pass urine frequently after taking this medicine. Do not take your doses at a time of day that will cause you problems. Do not stop taking your medicine unless your doctor tells you to. Talk to your pediatrician regarding the use of this medicine in children. Special care may be needed. Overdosage: If you think you have taken too much of this medicine contact a poison control center or emergency room at once. NOTE: This medicine is only for you. Do not share this medicine with others. What if I miss a dose? If you miss a dose, take it as soon as you can. If it is almost time for your next dose, take only that dose. Do not take double or extra doses. What may interact with this medicine? -cholestyramine -colestipol -digoxin -dofetilide -lithium -medicines for blood pressure -medicines for diabetes -medicines that relax muscles for surgery -other diuretics -steroid medicines like prednisone or cortisone This list may not describe all possible interactions. Give your health care provider a list of all the medicines, herbs, non-prescription drugs, or dietary supplements you use. Also tell them if you smoke, drink alcohol, or use illegal drugs. Some items may interact with your medicine. What should I watch for while using this medicine? Visit your doctor or health care professional for regular checks on your progress. Check your blood pressure as directed. Ask your doctor or health care professional what your blood pressure should be and when  you should contact him or her. You may need to be on a special diet while taking this medicine. Ask your doctor. Check with your doctor or health care professional if you get an attack of severe diarrhea, nausea and vomiting, or if you sweat a lot. The loss of too much body fluid can make it dangerous for you to take this medicine. You may get drowsy or dizzy. Do not drive, use machinery, or do anything that needs mental alertness until you know how this medicine affects you. Do not stand or sit up quickly, especially if you are an older patient. This reduces the risk of dizzy or fainting spells. Alcohol may interfere with the effect of this medicine. Avoid alcoholic drinks. This medicine may affect your blood sugar level. If you have diabetes, check with your doctor or health care professional before changing the dose of your diabetic medicine. This medicine can make you more sensitive to the sun. Keep out of the sun. If you cannot avoid being in the sun, wear protective clothing and use sunscreen. Do not use sun lamps or tanning beds/booths. What side effects may I notice from receiving this medicine? Side effects that you should report to your doctor or health care professional as soon as possible: -allergic reactions such as skin rash or itching, hives, swelling of the lips,  mouth, tongue, or throat -changes in vision -chest pain -eye pain -fast or irregular heartbeat -feeling faint or lightheaded, falls -gout attack -muscle pain or cramps -pain or difficulty when passing urine -pain, tingling, numbness in the hands or feet -redness, blistering, peeling or loosening of the skin, including inside the mouth -unusually weak or tired Side effects that usually do not require medical attention (report to your doctor or health care professional if they continue or are bothersome): -change in sex drive or performance -dry mouth -headache -stomach upset This list may not describe all possible  side effects. Call your doctor for medical advice about side effects. You may report side effects to FDA at 1-800-FDA-1088. Where should I keep my medicine? Keep out of the reach of children. Store at room temperature between 15 and 30 degrees C (59 and 86 degrees F). Do not freeze. Protect from light and moisture. Keep container closed tightly. Throw away any unused medicine after the expiration date. NOTE: This sheet is a summary. It may not cover all possible information. If you have questions about this medicine, talk to your doctor, pharmacist, or health care provider.    2016, Elsevier/Gold Standard. (2010-05-08 12:57:37)

## 2015-09-02 ENCOUNTER — Encounter: Payer: Self-pay | Admitting: Family Medicine

## 2015-09-02 ENCOUNTER — Ambulatory Visit (INDEPENDENT_AMBULATORY_CARE_PROVIDER_SITE_OTHER): Payer: Medicare Other | Admitting: Family Medicine

## 2015-09-02 VITALS — BP 142/68 | HR 60 | Temp 98.2°F | Wt 172.0 lb

## 2015-09-02 DIAGNOSIS — I10 Essential (primary) hypertension: Secondary | ICD-10-CM | POA: Diagnosis not present

## 2015-09-02 DIAGNOSIS — Z79899 Other long term (current) drug therapy: Secondary | ICD-10-CM | POA: Diagnosis not present

## 2015-09-02 MED ORDER — LISINOPRIL-HYDROCHLOROTHIAZIDE 20-25 MG PO TABS: 2.0000 | ORAL_TABLET | Freq: Every day | ORAL | Status: DC

## 2015-09-02 NOTE — Progress Notes (Signed)
BP 142/68 mmHg  Pulse 60  Temp(Src) 98.2 F (36.8 C)  Wt 172 lb (78.019 kg)  SpO2 98%  LMP  (Exact Date)   Subjective:    Patient ID: Kristina Henson, female    DOB: 1955/10/16, 59 y.o.   MRN: [id number]  HPI: Kristina Henson is a 59 y.o. female  Chief Complaint  Patient presents with  . Hypertension    Patient is here to follow up on her BP, she states that she has been exercising and trying to eat better.   HYPERTENSION- hasn't seen Dr. Ubaldo Glassing yet. Going to see him in January Hypertension status: uncontrolled  Satisfied with current treatment? no Duration of hypertension: chronic BP monitoring frequency:  not checking BP medication side effects:  no Medication compliance: good compliance Aspirin: yes Recurrent headaches: no Visual changes: no Palpitations: no Dyspnea: no Chest pain: no Lower extremity edema: no Dizzy/lightheaded: no  Relevant past medical, surgical, family and social history reviewed and updated as indicated. Interim medical history since our last visit reviewed. Allergies and medications reviewed and updated.  Review of Systems  Constitutional: Negative.   Respiratory: Negative.   Cardiovascular: Negative.   Psychiatric/Behavioral: Negative.    Per HPI unless specifically indicated above     Objective:    BP 142/68 mmHg  Pulse 60  Temp(Src) 98.2 F (36.8 C)  Wt 172 lb (78.019 kg)  SpO2 98%  LMP  (Exact Date)  Wt Readings from Last 3 Encounters:  09/02/15 172 lb (78.019 kg)  07/29/15 173 lb (78.472 kg)  06/25/15 167 lb 6.4 oz (75.932 kg)    Physical Exam  Constitutional: She is oriented to person, place, and time. She appears well-developed and well-nourished. No distress.  HENT:  Head: Normocephalic and atraumatic.  Right Ear: Hearing normal.  Left Ear: Hearing normal.  Nose: Nose normal.  Eyes: Conjunctivae and lids are normal. Right eye exhibits no discharge. Left eye exhibits no discharge. No scleral icterus.  Cardiovascular:  Normal rate and intact distal pulses.  Exam reveals no gallop and no friction rub.   No murmur heard. Pulmonary/Chest: Effort normal. No respiratory distress. She has no wheezes. She has no rales. She exhibits no tenderness.  Musculoskeletal: Normal range of motion.  Neurological: She is alert and oriented to person, place, and time.  Skin: Skin is warm, dry and intact. No rash noted. No erythema. No pallor.  Psychiatric: She has a normal mood and affect. Her speech is normal and behavior is normal. Judgment and thought content normal. Cognition and memory are normal.  Nursing note and vitals reviewed.   Results for orders placed or performed in visit on 07/29/15  Lipid panel  Result Value Ref Range   Triglycerides 108 40 - 160 mg/dL   Cholesterol 211 (A) 0 - 200 mg/dL   HDL 65 35 - 70 mg/dL   LDL Cholesterol 124 mg/dL  Fecal Occult Blood, Guaiac  Result Value Ref Range   Fecal Occult Blood Negative   CBC and differential  Result Value Ref Range   Hemoglobin 10.9 (A) 12.0 - 16.0 g/dL   HCT 33 (A) 36 - 46 %   Platelets 201 150 - 399 K/L   WBC 6.1 123456  Basic metabolic panel  Result Value Ref Range   Glucose 95 mg/dL   BUN 24 (A) 4 - 21 mg/dL   Creatinine 0.9 0.5 - 1.1 mg/dL   Potassium 3.8 3.4 - 5.3 mmol/L   Sodium 140 137 - 147 mmol/L  Hepatic function panel  Result Value Ref Range   Alkaline Phosphatase 87 25 - 125 U/L   ALT 18 7 - 35 U/L   AST 19 13 - 35 U/L   Bilirubin, Total 0.4 mg/dL  TSH  Result Value Ref Range   TSH 1.39 0.41 - 5.90 uIU/mL      Assessment & Plan:   Problem List Items Addressed This Visit      Cardiovascular and Mediastinum   Hypertension - Primary    Better on recheck. We will increase her HCTZ and will check back when she gets back from out of town. BMP and EKG at that time due to amiodarone. Call with any concerns. Continue to monitor.       Relevant Medications   lisinopril-hydrochlorothiazide (PRINZIDE,ZESTORETIC) 20-25 MG tablet    Other Relevant Orders   Basic metabolic panel   EKG XX123456    Other Visit Diagnoses    Long-term use of high-risk medication        Relevant Orders    Basic metabolic panel    EKG [id number]        Follow up plan: Return 1st Henson in Jan, for BP follow up.

## 2015-09-02 NOTE — Assessment & Plan Note (Signed)
Better on recheck. We will increase her HCTZ and will check back when she gets back from out of town. BMP and EKG at that time due to amiodarone. Call with any concerns. Continue to monitor.

## 2015-09-02 NOTE — Patient Instructions (Signed)
Take the water pill with the lisinopril 2x a day until you finish those bottles.   Then take the new pill (combination of water pill and lisinopril) 2x a day (new rx at pharmacy)  Come back when you get back from [facility]. We'll check your blood then

## 2015-10-07 ENCOUNTER — Telehealth: Payer: Self-pay | Admitting: Gastroenterology

## 2015-10-07 ENCOUNTER — Encounter: Admission: RE | Payer: Self-pay | Source: Ambulatory Visit

## 2015-10-07 ENCOUNTER — Other Ambulatory Visit: Payer: Self-pay

## 2015-10-07 ENCOUNTER — Ambulatory Visit: Payer: Medicare Other | Admitting: Family Medicine

## 2015-10-07 ENCOUNTER — Ambulatory Visit: Admission: RE | Admit: 2015-10-07 | Payer: Medicare Other | Source: Ambulatory Visit | Admitting: Gastroenterology

## 2015-10-07 SURGERY — COLONOSCOPY WITH PROPOFOL
Anesthesia: General

## 2015-10-07 NOTE — Telephone Encounter (Signed)
Orders sent to [facility] for 11/18/15.

## 2015-10-07 NOTE — Telephone Encounter (Signed)
Patient called this morning to say she wasn't coming for her colonoscopy due to the weather. She did not take her prep.  She would like to reschedule. I didn't see her on the schedule so I didn't call the hospital.

## 2015-10-07 NOTE — Telephone Encounter (Signed)
Patient called and wants to R/S to 2/21 [facility] ,Orders need to be put in

## 2015-10-14 DIAGNOSIS — E782 Mixed hyperlipidemia: Secondary | ICD-10-CM | POA: Diagnosis not present

## 2015-10-14 DIAGNOSIS — I639 Cerebral infarction, unspecified: Secondary | ICD-10-CM | POA: Diagnosis not present

## 2015-10-14 DIAGNOSIS — I4891 Unspecified atrial fibrillation: Secondary | ICD-10-CM | POA: Diagnosis not present

## 2015-10-14 DIAGNOSIS — I10 Essential (primary) hypertension: Secondary | ICD-10-CM | POA: Diagnosis not present

## 2015-10-14 DIAGNOSIS — Z79899 Other long term (current) drug therapy: Secondary | ICD-10-CM | POA: Diagnosis not present

## 2015-10-21 ENCOUNTER — Encounter: Payer: Self-pay | Admitting: Family Medicine

## 2015-10-21 ENCOUNTER — Ambulatory Visit (INDEPENDENT_AMBULATORY_CARE_PROVIDER_SITE_OTHER): Payer: Medicare Other | Admitting: Family Medicine

## 2015-10-21 DIAGNOSIS — I10 Essential (primary) hypertension: Secondary | ICD-10-CM

## 2015-10-21 DIAGNOSIS — Z79899 Other long term (current) drug therapy: Secondary | ICD-10-CM | POA: Diagnosis not present

## 2015-10-21 NOTE — Assessment & Plan Note (Signed)
Still elevated. Will work on [employer] and check back in 2 months. If BP still elevated, will increase her medication.

## 2015-10-21 NOTE — Progress Notes (Signed)
BP 166/80 mmHg  Pulse 53  Temp(Src) 97.4 F (36.3 C)  Ht 5' 1.6" (1.565 m)  Wt 175 lb (79.379 kg)  BMI 32.41 kg/m2  SpO2 99%  LMP  (Exact Date)   Subjective:    Patient ID: Kristina Henson, female    DOB: [birth year], 58 y.o.   MRN: [id number]  HPI: Kristina Henson is a 60 y.o. female  Chief Complaint  Patient presents with  . Hypertension   Has been following with Dr. Ubaldo Glassing for her heart. S/P SVT ablation with A fib. Was in sinus rhythm on amiodarone. Anticoagulated with aspirin as she couldn't afford novel agents and was not easily controllable with warfarin. BP elevated at cardiologists, working on [employer]. Following up with him in 6 months.   HYPERTENSION Hypertension status: better  Satisfied with current treatment? yes Duration of hypertension: chronic BP monitoring frequency:  not checking BP medication side effects:  no Medication compliance: excellent compliance Aspirin: yes Recurrent headaches: no Visual changes: no Palpitations: no Dyspnea: no Chest pain: no Lower extremity edema: no Dizzy/lightheaded: no   Relevant past medical, surgical, family and social history reviewed and updated as indicated. Interim medical history since our last visit reviewed. Allergies and medications reviewed and updated.  Review of Systems  Constitutional: Negative.   Respiratory: Negative.   Cardiovascular: Negative.   Psychiatric/Behavioral: Negative.     Per HPI unless specifically indicated above     Objective:    BP 166/80 mmHg  Pulse 53  Temp(Src) 97.4 F (36.3 C)  Ht 5' 1.6" (1.565 m)  Wt 175 lb (79.379 kg)  BMI 32.41 kg/m2  SpO2 99%  LMP  (Exact Date)  Wt Readings from Last 3 Encounters:  10/21/15 175 lb (79.379 kg)  09/02/15 172 lb (78.019 kg)  07/29/15 173 lb (78.472 kg)    Physical Exam  Results for orders placed or performed in visit on 07/29/15  Lipid panel  Result Value Ref Range   Triglycerides 108 40 - 160 mg/dL   Cholesterol 211 (A) 0 -  200 mg/dL   HDL 65 35 - 70 mg/dL   LDL Cholesterol 124 mg/dL  Fecal Occult Blood, Guaiac  Result Value Ref Range   Fecal Occult Blood Negative   CBC and differential  Result Value Ref Range   Hemoglobin 10.9 (A) 12.0 - 16.0 g/dL   HCT 33 (A) 36 - 46 %   Platelets 201 150 - 399 K/L   WBC 6.1 123456  Basic metabolic panel  Result Value Ref Range   Glucose 95 mg/dL   BUN 24 (A) 4 - 21 mg/dL   Creatinine 0.9 0.5 - 1.1 mg/dL   Potassium 3.8 3.4 - 5.3 mmol/L   Sodium 140 137 - 147 mmol/L  Hepatic function panel  Result Value Ref Range   Alkaline Phosphatase 87 25 - 125 U/L   ALT 18 7 - 35 U/L   AST 19 13 - 35 U/L   Bilirubin, Total 0.4 mg/dL  TSH  Result Value Ref Range   TSH 1.39 0.41 - 5.90 uIU/mL      Assessment & Plan:   Problem List Items Addressed This Visit      Cardiovascular and Mediastinum   Hypertension    Still elevated. Will work on [employer] and check back in 2 months. If BP still elevated, will increase her medication.       Relevant Medications   lisinopril-hydrochlorothiazide (PRINZIDE,ZESTORETIC) 20-25 MG tablet    Other Visit Diagnoses  Encounter for long-term (current) use of medications        CMP checked today. EKG shows no changed from previous at cardiology.    Long-term use of high-risk medication        CMP checked today. EKG shows no changed from previous at cardiology.        Follow up plan: Return in about 2 months (around 12/19/2015).

## 2015-10-21 NOTE — Patient Instructions (Signed)
DASH Eating Plan  DASH stands for "Dietary Approaches to Stop Hypertension." The DASH eating plan is a healthy eating plan that has been shown to reduce high blood pressure (hypertension). Additional health benefits may include reducing the risk of type 2 diabetes mellitus, heart disease, and stroke. The DASH eating plan may also help with weight loss.  WHAT DO I NEED TO KNOW ABOUT THE DASH EATING PLAN?  For the DASH eating plan, you will follow these general guidelines:  · Choose foods with a percent daily value for sodium of less than 5% (as listed on the food label).  · Use salt-free seasonings or herbs instead of table salt or sea salt.  · Check with your health care provider or pharmacist before using salt substitutes.  · Eat lower-sodium products, often labeled as "lower sodium" or "no salt added."  · Eat fresh foods.  · Eat more vegetables, fruits, and low-fat dairy products.  · Choose whole grains. Look for the word "whole" as the first word in the ingredient list.  · Choose fish and skinless chicken or turkey more often than red meat. Limit fish, poultry, and meat to 6 oz (170 g) each day.  · Limit sweets, desserts, sugars, and sugary drinks.  · Choose heart-healthy fats.  · Limit cheese to 1 oz (28 g) per day.  · Eat more home-cooked food and less restaurant, buffet, and fast food.  · Limit fried foods.  · Cook foods using methods other than frying.  · Limit canned vegetables. If you do use them, rinse them well to decrease the sodium.  · When eating at a restaurant, ask that your food be prepared with less salt, or no salt if possible.  WHAT FOODS CAN I EAT?  Seek help from a dietitian for individual calorie needs.  Grains  Whole grain or whole wheat bread. Brown rice. Whole grain or whole wheat pasta. Quinoa, bulgur, and whole grain cereals. Low-sodium cereals. Corn or whole wheat flour tortillas. Whole grain cornbread. Whole grain crackers. Low-sodium crackers.  Vegetables  Fresh or frozen vegetables  (raw, steamed, roasted, or grilled). Low-sodium or reduced-sodium tomato and vegetable juices. Low-sodium or reduced-sodium tomato sauce and paste. Low-sodium or reduced-sodium canned vegetables.   Fruits  All fresh, canned (in natural juice), or frozen fruits.  Meat and Other Protein Products  Ground beef (85% or leaner), grass-fed beef, or beef trimmed of fat. Skinless chicken or turkey. Ground chicken or turkey. Pork trimmed of fat. All fish and seafood. Eggs. Dried beans, peas, or lentils. Unsalted nuts and seeds. Unsalted canned beans.  Dairy  Low-fat dairy products, such as skim or 1% milk, 2% or reduced-fat cheeses, low-fat ricotta or cottage cheese, or plain low-fat yogurt. Low-sodium or reduced-sodium cheeses.  Fats and Oils  Tub margarines without trans fats. Light or reduced-fat mayonnaise and salad dressings (reduced sodium). Avocado. Safflower, olive, or canola oils. Natural peanut or almond butter.  Other  Unsalted popcorn and pretzels.  The items listed above may not be a complete list of recommended foods or beverages. Contact your dietitian for more options.  WHAT FOODS ARE NOT RECOMMENDED?  Grains  White bread. White pasta. White rice. Refined cornbread. Bagels and croissants. Crackers that contain trans fat.  Vegetables  Creamed or fried vegetables. Vegetables in a cheese sauce. Regular canned vegetables. Regular canned tomato sauce and paste. Regular tomato and vegetable juices.  Fruits  Dried fruits. Canned fruit in light or heavy syrup. Fruit juice.  Meat and Other Protein   Products  Fatty cuts of meat. Ribs, chicken wings, bacon, sausage, bologna, salami, chitterlings, fatback, hot dogs, bratwurst, and packaged luncheon meats. Salted nuts and seeds. Canned beans with salt.  Dairy  Whole or 2% milk, cream, half-and-half, and cream cheese. Whole-fat or sweetened yogurt. Full-fat cheeses or blue cheese. Nondairy creamers and whipped toppings. Processed cheese, cheese spreads, or cheese  curds.  Condiments  Onion and garlic salt, seasoned salt, table salt, and sea salt. Canned and packaged gravies. Worcestershire sauce. Tartar sauce. Barbecue sauce. Teriyaki sauce. Soy sauce, including reduced sodium. Steak sauce. Fish sauce. Oyster sauce. Cocktail sauce. Horseradish. Ketchup and mustard. Meat flavorings and tenderizers. Bouillon cubes. Hot sauce. Tabasco sauce. Marinades. Taco seasonings. Relishes.  Fats and Oils  Butter, stick margarine, lard, shortening, ghee, and bacon fat. Coconut, palm kernel, or palm oils. Regular salad dressings.  Other  Pickles and olives. Salted popcorn and pretzels.  The items listed above may not be a complete list of foods and beverages to avoid. Contact your dietitian for more information.  WHERE CAN I FIND MORE INFORMATION?  National Heart, Lung, and Blood Institute: www.nhlbi.nih.gov/health/health-topics/topics/dash/     This information is not intended to replace advice given to you by your health care provider. Make sure you discuss any questions you have with your health care provider.     Document Released: 09/02/2011 Document Revised: 10/04/2014 Document Reviewed: 07/18/2013  Elsevier Interactive Patient Education ©2016 [employer]

## 2015-10-22 ENCOUNTER — Encounter: Payer: Self-pay | Admitting: Family Medicine

## 2015-10-22 LAB — COMPREHENSIVE METABOLIC PANEL
A/G RATIO: 1.5 (ref 1.1–2.5)
ALK PHOS: 86 IU/L (ref 39–117)
ALT: 14 IU/L (ref 0–32)
AST: 15 IU/L (ref 0–40)
Albumin: 4 g/dL (ref 3.5–5.5)
BILIRUBIN TOTAL: 0.3 mg/dL (ref 0.0–1.2)
BUN/Creatinine Ratio: 31 — ABNORMAL HIGH (ref 9–23)
BUN: 23 mg/dL (ref 6–24)
CHLORIDE: 101 mmol/L (ref 96–106)
CO2: 25 mmol/L (ref 18–29)
Calcium: 9.5 mg/dL (ref 8.7–10.2)
Creatinine, Ser: 0.75 mg/dL (ref 0.57–1.00)
GFR calc Af Amer: 102 mL/min/{1.73_m2} (ref >59)
GFR calc non Af Amer: 89 mL/min/{1.73_m2} (ref >59)
Globulin, Total: 2.6 g/dL (ref 1.5–4.5)
Glucose: 96 mg/dL (ref 65–99)
POTASSIUM: 3.8 mmol/L (ref 3.5–5.2)
SODIUM: 141 mmol/L (ref 134–144)
Total Protein: 6.6 g/dL (ref 6.0–8.5)

## 2015-11-18 ENCOUNTER — Encounter
Admission: RE | Disposition: A | Discharge status: Home / Self Care | Payer: Self-pay | Source: Ambulatory Visit | Attending: Gastroenterology

## 2015-11-18 ENCOUNTER — Ambulatory Visit: Payer: Medicare Other | Admitting: Anesthesiology

## 2015-11-18 ENCOUNTER — Encounter: Payer: Self-pay | Admitting: *Deleted

## 2015-11-18 ENCOUNTER — Ambulatory Visit
Admission: RE | Admit: 2015-11-18 | Discharge: 2015-11-18 | Disposition: A | Discharge status: Home / Self Care | Payer: Medicare Other | Source: Ambulatory Visit | Attending: Gastroenterology | Admitting: Gastroenterology

## 2015-11-18 DIAGNOSIS — K641 Second degree hemorrhoids: Secondary | ICD-10-CM | POA: Insufficient documentation

## 2015-11-18 DIAGNOSIS — I10 Essential (primary) hypertension: Secondary | ICD-10-CM | POA: Diagnosis not present

## 2015-11-18 DIAGNOSIS — D125 Benign neoplasm of sigmoid colon: Secondary | ICD-10-CM | POA: Insufficient documentation

## 2015-11-18 DIAGNOSIS — Z79899 Other long term (current) drug therapy: Secondary | ICD-10-CM | POA: Diagnosis not present

## 2015-11-18 DIAGNOSIS — Z8673 Personal history of transient ischemic attack (TIA), and cerebral infarction without residual deficits: Secondary | ICD-10-CM | POA: Insufficient documentation

## 2015-11-18 DIAGNOSIS — Z1211 Encounter for screening for malignant neoplasm of colon: Secondary | ICD-10-CM | POA: Diagnosis not present

## 2015-11-18 DIAGNOSIS — I4891 Unspecified atrial fibrillation: Secondary | ICD-10-CM | POA: Diagnosis not present

## 2015-11-18 DIAGNOSIS — Z7982 Long term (current) use of aspirin: Secondary | ICD-10-CM | POA: Diagnosis not present

## 2015-11-18 DIAGNOSIS — K635 Polyp of colon: Secondary | ICD-10-CM | POA: Diagnosis not present

## 2015-11-18 HISTORY — PX: COLONOSCOPY WITH PROPOFOL: SHX5780

## 2015-11-18 HISTORY — DX: Cardiac arrhythmia, unspecified: I49.9

## 2015-11-18 SURGERY — COLONOSCOPY WITH PROPOFOL
Anesthesia: General

## 2015-11-18 MED ORDER — SODIUM CHLORIDE 0.9 % IV SOLN
INTRAVENOUS | Status: DC
Start: 2015-11-18 — End: 2015-11-18
  Administered 2015-11-18: 09:00:00 via INTRAVENOUS

## 2015-11-18 MED ORDER — SODIUM CHLORIDE 0.9 % IV SOLN: INTRAVENOUS | Status: DC

## 2015-11-18 MED ORDER — PROPOFOL 500 MG/50ML IV EMUL
INTRAVENOUS | Status: DC | PRN
  Administered 2015-11-18: 100 ug/kg/min via INTRAVENOUS

## 2015-11-18 MED ORDER — SODIUM CHLORIDE 0.9 % IV SOLN
INTRAVENOUS | Status: DC
Start: 2015-11-18 — End: 2015-11-18

## 2015-11-18 NOTE — Transfer of Care (Signed)
Immediate Anesthesia Transfer of Care Note  Patient: Kristina Henson  Procedure(s) Performed: Procedure(s): COLONOSCOPY WITH PROPOFOL (N/A)  Patient Location: PACU  Anesthesia Type:General  Level of Consciousness: awake, alert  and oriented  Airway & Oxygen Therapy: Patient Spontanous Breathing and Patient connected to nasal cannula oxygen  Post-op Assessment: Report given to RN and Post -op Vital signs reviewed and stable  Post vital signs: Reviewed and stable  Last Vitals:  Filed Vitals:   11/18/15 1003 11/18/15 1004  BP:  101/42  Pulse:  47  Temp: 35.7 C   Resp:  20    Complications: No apparent anesthesia complications

## 2015-11-18 NOTE — H&P (Signed)
  [facility]  [address] [place], [place] [address] Phone: [phone] Fax : [phone]  Primary Care Physician:  Park Liter, DO Primary Gastroenterologist:  Dr. Allen Norris  Pre-Procedure History & Physical: HPI:  Kristina Henson is a 60 y.o. female is here for a screening colonoscopy.   Past Medical History  Diagnosis Date  . Stroke ([place])   . Atrial fibrillation ([place])   . Hypertension   . Dysrhythmia     Past Surgical History  Procedure Laterality Date  . Abdominal hysterectomy      Total  . Cardiac catherization      Prior to Admission medications   Medication Sig Start Date End Date Taking? Authorizing Provider  amiodarone (PACERONE) 200 MG tablet Take 200 mg by mouth daily.   Yes Historical Provider, MD  aspirin EC 81 MG tablet Take by mouth.   Yes Historical Provider, MD  ferrous sulfate 325 (65 FE) MG EC tablet Take by mouth.   Yes Historical Provider, MD  hydrALAZINE (APRESOLINE) 50 MG tablet Take 50 mg by mouth 2 (two) times daily.   Yes Historical Provider, MD  lisinopril-hydrochlorothiazide (PRINZIDE,ZESTORETIC) 20-25 MG tablet Take 20-25 tablets by mouth daily. 10/14/15  Yes Historical Provider, MD    Allergies as of 10/07/2015  . (No Known Allergies)    Family History  Problem Relation Age of Onset  . Arthritis Mother   . Cancer Mother   . Hypertension Sister   . Asthma Brother   . Breast cancer Neg Hx     Social History   Social History  . Marital Status: Divorced    Spouse Name: N/A  . Number of Children: N/A  . Years of Education: N/A   Occupational History  . Not on file.   Social History Main Topics  . Smoking status: Never Smoker   . Smokeless tobacco: Never Used  . Alcohol Use: No  . Drug Use: No  . Sexual Activity: Yes   Other Topics Concern  . Not on file   Social History Narrative    Review of Systems: See HPI, otherwise negative ROS  Physical Exam: BP 170/75 mmHg  Pulse 64  Temp(Src) 97.5 F  (36.4 C) (Tympanic)  Resp 17  Ht 5\' 2"  (1.575 m)  Wt 160 lb (72.576 kg)  BMI 29.26 kg/m2  SpO2 98%  LMP  (Exact Date) General:   Alert,  pleasant and cooperative in NAD Head:  Normocephalic and atraumatic. Neck:  Supple; no masses or thyromegaly. Lungs:  Clear throughout to auscultation.    Heart:  Regular rate and rhythm. Abdomen:  Soft, nontender and nondistended. Normal bowel sounds, without guarding, and without rebound.   Neurologic:  Alert and  oriented x4;  grossly normal neurologically.  Impression/Plan: Kristina Henson is now here to undergo a screening colonoscopy.  Risks, benefits, and alternatives regarding colonoscopy have been reviewed with the patient.  Questions have been answered.  All parties agreeable.

## 2015-11-18 NOTE — Anesthesia Postprocedure Evaluation (Signed)
Anesthesia Post Note  Patient: Kristina Henson  Procedure(s) Performed: Procedure(s) (LRB): COLONOSCOPY WITH PROPOFOL (N/A)  Patient location during evaluation: Endoscopy Anesthesia Type: General Level of consciousness: awake and alert Pain management: pain level controlled Vital Signs Assessment: post-procedure vital signs reviewed and stable Respiratory status: spontaneous breathing, nonlabored ventilation, respiratory function stable and patient connected to nasal cannula oxygen Cardiovascular status: blood pressure returned to baseline and stable Postop Assessment: no signs of nausea or vomiting Anesthetic complications: no    Last Vitals:  Filed Vitals:   11/18/15 1004 11/18/15 1025  BP: 101/42 147/65  Pulse: 47 45  Temp:    Resp: 20 12    Last Pain: There were no vitals filed for this visit.               [name]

## 2015-11-18 NOTE — Op Note (Signed)
[facility] Patient Name: Kristina Henson Procedure Date: 11/18/2015 9:26 AM MRN: [id number] Account #: [id number] Date of Birth: [birth year] Admit Type: Outpatient Age: 60 Room: [facility] ENDO ROOM 4 Gender: Female Note Status: Finalized Procedure:            Colonoscopy Indications:          Screening for colorectal malignant neoplasm Providers:            Lucilla Lame, MD Referring MD:         Guadalupe Maple, MD (Referring MD) Medicines:            Propofol per Anesthesia Complications:        No immediate complications. Procedure:            Pre-Anesthesia Assessment:                       - Prior to the procedure, a History and Physical was                        performed, and patient medications and allergies were                        reviewed. The patient's tolerance of previous                        anesthesia was also reviewed. The risks and benefits of                        the procedure and the sedation options and risks were                        discussed with the patient. All questions were                        answered, and informed consent was obtained. Prior                        Anticoagulants: The patient has taken no previous                        anticoagulant or antiplatelet agents. ASA Grade                        Assessment: II - A patient with mild systemic disease.                        After reviewing the risks and benefits, the patient was                        deemed in satisfactory condition to undergo the                        procedure.                       After obtaining informed consent, the colonoscope was                        passed under direct vision. Throughout the procedure,  the patient's blood pressure, pulse, and oxygen                        saturations were monitored continuously. The                        Colonoscope was introduced through the anus and         advanced to the the cecum, identified by appendiceal                        orifice and ileocecal valve. The colonoscopy was                        performed without difficulty. The patient tolerated the                        procedure well. The quality of the bowel preparation                        was excellent. Findings:      The perianal and digital rectal examinations were normal.      A 8 mm polyp was found in the sigmoid colon. The polyp was pedunculated.       The polyp was removed with a hot snare. Resection and retrieval were       complete.      Non-bleeding internal hemorrhoids were found during retroflexion. The       hemorrhoids were Grade II (internal hemorrhoids that prolapse but reduce       spontaneously). Impression:           - One 8 mm polyp in the sigmoid colon, removed with a                        hot snare. Resected and retrieved.                       - Non-bleeding internal hemorrhoids. Recommendation:       - Await pathology results.                       - Repeat colonoscopy in 5 years if polyp adenoma and 10                        years if hyperplastic Procedure Code(s):    --- Professional ---                       [phone], Colonoscopy, flexible; with removal of tumor(s),                        polyp(s), or other lesion(s) by snare technique Diagnosis Code(s):    --- Professional ---                       Z12.11, Encounter for screening for malignant neoplasm                        of colon                       D12.5, Benign neoplasm of sigmoid colon CPT copyright 2016 American Medical  Association. All rights reserved. The codes documented in this report are preliminary and upon coder review may  be revised to meet current compliance requirements. Lucilla Lame, MD 11/18/2015 10:02:17 AM This report has been signed electronically. Number of Addenda: 0 Note Initiated On: 11/18/2015 9:26 AM Scope Withdrawal Time: 0 hours 9 minutes 51 seconds  Total  Procedure Duration: 0 hours 14 minutes 14 seconds       [facility]

## 2015-11-18 NOTE — Anesthesia Preprocedure Evaluation (Signed)
Anesthesia Evaluation  Patient identified by MRN, date of birth, ID band Patient awake    Reviewed: Allergy & Precautions, H&P , NPO status , Patient's Chart, lab work & pertinent test results  History of Anesthesia Complications Negative for: history of anesthetic complications  Airway Mallampati: III  TM Distance: >3 FB Neck ROM: limited    Dental  (+) Poor Dentition, Chipped   Pulmonary neg pulmonary ROS, neg shortness of breath,    Pulmonary exam normal breath sounds clear to auscultation       Cardiovascular Exercise Tolerance: Good hypertension, (-) angina(-) Past MI and (-) DOE Normal cardiovascular exam+ dysrhythmias Atrial Fibrillation  Rhythm:regular Rate:Normal     Neuro/Psych CVA, Residual Symptoms negative psych ROS   GI/Hepatic negative GI ROS, Neg liver ROS,   Endo/Other  negative endocrine ROS  Renal/GU negative Renal ROS  negative genitourinary   Musculoskeletal   Abdominal   Peds  Hematology negative hematology ROS (+)   Anesthesia Other Findings Past Medical History:   Stroke ([place])                                                 Atrial fibrillation ([place])                                    Hypertension                                                 Dysrhythmia                                                 Past Surgical History:   ABDOMINAL HYSTERECTOMY                                          Comment:Total   cardiac catherization                                        BMI    Body Mass Index   29.25 kg/m 2    Signs and symptoms suggestive of sleep apnea    Reproductive/Obstetrics negative OB ROS                             Anesthesia Physical Anesthesia Plan  ASA: III  Anesthesia Plan: General   Post-op Pain Management:    Induction:   Airway Management Planned:   Additional Equipment:   Intra-op Plan:   Post-operative Plan:   Informed  Consent: I have reviewed the patients History and Physical, chart, labs and discussed the procedure including the risks, benefits and alternatives for the proposed anesthesia with the patient or authorized representative who has indicated his/her understanding and acceptance.   Dental Advisory Given  Plan Discussed with: Anesthesiologist, CRNA and  Surgeon  Anesthesia Plan Comments:         Anesthesia Quick Evaluation

## 2015-11-19 ENCOUNTER — Encounter: Payer: Self-pay | Admitting: Gastroenterology

## 2015-11-19 LAB — SURGICAL PATHOLOGY

## 2015-11-20 ENCOUNTER — Encounter: Payer: Self-pay | Admitting: Gastroenterology

## 2015-11-25 NOTE — Telephone Encounter (Signed)
This encounter was created in error - please disregard.

## 2015-12-23 ENCOUNTER — Encounter: Payer: Self-pay | Admitting: Family Medicine

## 2015-12-23 ENCOUNTER — Ambulatory Visit (INDEPENDENT_AMBULATORY_CARE_PROVIDER_SITE_OTHER): Payer: Medicare Other | Admitting: Family Medicine

## 2015-12-23 VITALS — BP 164/84 | HR 57 | Temp 98.7°F | Ht 61.2 in | Wt 177.0 lb

## 2015-12-23 DIAGNOSIS — R300 Dysuria: Secondary | ICD-10-CM | POA: Diagnosis not present

## 2015-12-23 DIAGNOSIS — N3001 Acute cystitis with hematuria: Secondary | ICD-10-CM | POA: Diagnosis not present

## 2015-12-23 DIAGNOSIS — I10 Essential (primary) hypertension: Secondary | ICD-10-CM | POA: Diagnosis not present

## 2015-12-23 MED ORDER — LISINOPRIL-HYDROCHLOROTHIAZIDE 20-25 MG PO TABS: 2.0000 | ORAL_TABLET | Freq: Every day | ORAL | Status: DC

## 2015-12-23 MED ORDER — NITROFURANTOIN MONOHYD MACRO 100 MG PO CAPS: 100.0000 mg | ORAL_CAPSULE | Freq: Two times a day (BID) | ORAL | Status: DC

## 2015-12-23 MED ORDER — CIPROFLOXACIN HCL 500 MG PO TABS: 500.0000 mg | ORAL_TABLET | Freq: Two times a day (BID) | ORAL | Status: DC

## 2015-12-23 NOTE — Assessment & Plan Note (Signed)
UA

## 2015-12-23 NOTE — Progress Notes (Signed)
BP 164/84 mmHg  Pulse 57  Temp(Src) 98.7 F (37.1 C)  Ht 5' 1.2" (1.554 m)  Wt 177 lb (80.287 kg)  BMI 33.25 kg/m2  SpO2 99%  LMP  (Exact Date)   Subjective:    Patient ID: Kristina Henson, female    DOB: 1956/09/27, 60 y.o.   MRN: [id number]  HPI: [name] is a 60 y.o. female  Chief Complaint  Patient presents with  . Hypertension  . Urinary Tract Infection   HYPERTENSION Hypertension status: uncontrolled  Satisfied with current treatment? no Duration of hypertension: chronic BP monitoring frequency:  not checking BP medication side effects:  no Medication compliance: excellent compliance Aspirin: no Recurrent headaches: no Visual changes: no Palpitations: no Dyspnea: no Chest pain: no Lower extremity edema: no Dizzy/lightheaded: no  URINARY SYMPTOMS Duration: 2 weeks ago Dysuria: no Urinary frequency: yes Urgency: yes Small volume voids: yes Symptom severity: moderate Urinary incontinence: no Foul odor: yes Hematuria: no Abdominal pain: no Back pain: no Suprapubic pain/pressure: no Flank pain: no Fever:  no Vomiting: no  Relevant past medical, surgical, family and social history reviewed and updated as indicated. Interim medical history since our last visit reviewed. Allergies and medications reviewed and updated.  Review of Systems  Constitutional: Negative.   Respiratory: Negative.   Cardiovascular: Negative.   Gastrointestinal: Negative.   Genitourinary: Positive for urgency and frequency. Negative for dysuria, hematuria, flank pain, decreased urine volume, vaginal bleeding, vaginal discharge, enuresis, difficulty urinating, genital sores, vaginal pain, menstrual problem, pelvic pain and dyspareunia.  Psychiatric/Behavioral: Negative.     Per HPI unless specifically indicated above     Objective:    BP 164/84 mmHg  Pulse 57  Temp(Src) 98.7 F (37.1 C)  Ht 5' 1.2" (1.554 m)  Wt 177 lb (80.287 kg)  BMI 33.25 kg/m2  SpO2 99%  LMP   (Exact Date)  Wt Readings from Last 3 Encounters:  12/23/15 177 lb (80.287 kg)  11/18/15 160 lb (72.576 kg)  10/21/15 175 lb (79.379 kg)    Physical Exam  Constitutional: She is oriented to person, place, and time. She appears well-developed and well-nourished. No distress.  HENT:  Head: Normocephalic and atraumatic.  Right Ear: Hearing normal.  Left Ear: Hearing normal.  Nose: Nose normal.  Eyes: Conjunctivae and lids are normal. Right eye exhibits no discharge. Left eye exhibits no discharge. No scleral icterus.  Cardiovascular: Normal rate, regular rhythm, normal heart sounds and intact distal pulses.  Exam reveals no gallop and no friction rub.   No murmur heard. Pulmonary/Chest: Effort normal and breath sounds normal. No respiratory distress. She has no wheezes. She has no rales. She exhibits no tenderness.  Abdominal: Soft. Bowel sounds are normal. She exhibits no distension and no mass. There is no tenderness. There is no rebound, no guarding and no CVA tenderness.  Musculoskeletal: Normal range of motion.  Neurological: She is alert and oriented to person, place, and time.  Skin: Skin is warm, dry and intact. No rash noted. No erythema. No pallor.  Psychiatric: She has a normal mood and affect. Her speech is normal and behavior is normal. Judgment and thought content normal. Cognition and memory are normal.  Nursing note and vitals reviewed.      Assessment & Plan:   Problem List Items Addressed This Visit      Cardiovascular and Mediastinum   Hypertension - Primary    Not under good control. Will increase lisinopril-hctz to 20/25 and check back in 1 month.  Continue to monitor.       Relevant Medications   lisinopril-hydrochlorothiazide (PRINZIDE,ZESTORETIC) 20-25 MG tablet     Other   Dysuria    + UA      Relevant Orders   UA/M w/rflx Culture, Routine    Other Visit Diagnoses    Acute cystitis with hematuria        Will treat with nitrofurantoin for +UA. Call  with any concerns.         Follow up plan: Return in about 4 weeks (around 01/20/2016) for BP check.

## 2015-12-23 NOTE — Assessment & Plan Note (Signed)
Not under good control. Will increase lisinopril-hctz to 20/25 and check back in 1 month. Continue to monitor.

## 2015-12-28 LAB — URINE CULTURE, REFLEX

## 2015-12-28 LAB — UA/M W/RFLX CULTURE, ROUTINE
Bilirubin, UA: NEGATIVE
Glucose, UA: NEGATIVE
Ketones, UA: NEGATIVE
NITRITE UA: NEGATIVE
PH UA: 5.5 (ref 5.0–7.5)
Protein, UA: NEGATIVE
Specific Gravity, UA: <1.005 — ABNORMAL LOW (ref 1.005–1.030)
Urobilinogen, Ur: 0.2 mg/dL (ref 0.2–1.0)

## 2015-12-28 LAB — MICROSCOPIC EXAMINATION

## 2016-01-20 ENCOUNTER — Encounter: Payer: Self-pay | Admitting: Family Medicine

## 2016-01-20 ENCOUNTER — Ambulatory Visit (INDEPENDENT_AMBULATORY_CARE_PROVIDER_SITE_OTHER): Payer: Medicare Other | Admitting: Family Medicine

## 2016-01-20 VITALS — BP 121/72 | HR 58 | Temp 98.4°F | Ht 61.2 in | Wt 172.0 lb

## 2016-01-20 DIAGNOSIS — L821 Other seborrheic keratosis: Secondary | ICD-10-CM | POA: Diagnosis not present

## 2016-01-20 DIAGNOSIS — H6121 Impacted cerumen, right ear: Secondary | ICD-10-CM | POA: Diagnosis not present

## 2016-01-20 DIAGNOSIS — I10 Essential (primary) hypertension: Secondary | ICD-10-CM | POA: Diagnosis not present

## 2016-01-20 MED ORDER — LISINOPRIL-HYDROCHLOROTHIAZIDE 20-25 MG PO TABS: 2.0000 | ORAL_TABLET | Freq: Every day | ORAL | Status: DC

## 2016-01-20 MED ORDER — CARBAMIDE PEROXIDE 6.5 % OT SOLN: 5.0000 [drp] | Freq: Two times a day (BID) | OTIC | Status: DC

## 2016-01-20 NOTE — Assessment & Plan Note (Signed)
Under good control. Continue current regimen. Continue to monitor.  

## 2016-01-20 NOTE — Progress Notes (Signed)
BP 121/72 mmHg  Pulse 58  Temp(Src) 98.4 F (36.9 C)  Ht 5' 1.2" (1.554 m)  Wt 172 lb (78.019 kg)  BMI 32.31 kg/m2  SpO2 98%  LMP  (Exact Date)   Subjective:    Patient ID: Kristina Henson, female    DOB: 27-Oct-1955, 60 y.o.   MRN: [id number]  HPI: Kristina Henson is a 60 y.o. female  Chief Complaint  Patient presents with  . Hypertension  . Ear Pain    right ear pain   HYPERTENSION Hypertension status: controlled  Satisfied with current treatment? yes Duration of hypertension: chronic BP monitoring frequency:  not checking BP medication side effects:  no Medication compliance: excellent compliance Aspirin: yes Recurrent headaches: no Visual changes: no Palpitations: no Dyspnea: no Chest pain: no Lower extremity edema: no Dizzy/lightheaded: no   EAR PAIN- went to the beach for easter Duration: 2 weeks Involved ear(s): right Severity:  severe  Quality:  aching Fever: no Otorrhea: no Upper respiratory infection symptoms: no Pruritus: no Hearing loss: no Water immersion no Using Q-tips: no Recurrent otitis media: no Status: stable Treatments attempted: none  SKIN LESION- is very concerned about it, as is her daughter, would like to see dermatology Duration: chronic Location: L cheek Painful: no Itching: no Onset: gradual Context: not changing Associated signs and symptoms: none History of skin cancer: no History of precancerous skin lesions: no Family history of skin cancer: no  Relevant past medical, surgical, family and social history reviewed and updated as indicated. Interim medical history since our last visit reviewed. Allergies and medications reviewed and updated.  Review of Systems  Constitutional: Negative.   HENT: Positive for ear pain. Negative for congestion, dental problem, drooling, ear discharge, facial swelling, hearing loss, mouth sores, nosebleeds, postnasal drip, rhinorrhea, sinus pressure, sneezing, sore throat, tinnitus, trouble  swallowing and voice change.   Respiratory: Negative.   Cardiovascular: Negative.   Psychiatric/Behavioral: Negative.     Per HPI unless specifically indicated above     Objective:    BP 121/72 mmHg  Pulse 58  Temp(Src) 98.4 F (36.9 C)  Ht 5' 1.2" (1.554 m)  Wt 172 lb (78.019 kg)  BMI 32.31 kg/m2  SpO2 98%  LMP  (Exact Date)  Wt Readings from Last 3 Encounters:  01/20/16 172 lb (78.019 kg)  12/23/15 177 lb (80.287 kg)  11/18/15 160 lb (72.576 kg)    Physical Exam  Constitutional: She is oriented to person, place, and time. She appears well-developed and well-nourished. No distress.  HENT:  Head: Normocephalic and atraumatic.  Right Ear: Hearing normal.  Left Ear: Hearing and external ear normal.  Nose: Nose normal.  Mouth/Throat: Oropharynx is clear and moist. No oropharyngeal exudate.  Dry impacted cerumen R EAC  Eyes: Conjunctivae, EOM and lids are normal. Pupils are equal, round, and reactive to light. Right eye exhibits no discharge. Left eye exhibits no discharge. No scleral icterus.  Neck: Normal range of motion. Neck supple. No JVD present. No tracheal deviation present. No thyromegaly present.  Cardiovascular: Normal rate, regular rhythm, normal heart sounds and intact distal pulses.  Exam reveals no gallop and no friction rub.   No murmur heard. Pulmonary/Chest: Effort normal and breath sounds normal. No stridor. No respiratory distress. She has no wheezes. She has no rales. She exhibits no tenderness.  Musculoskeletal: Normal range of motion.  Lymphadenopathy:    She has no cervical adenopathy.  Neurological: She is alert and oriented to person, place, and time.  Skin:  Skin is warm, dry and intact. No rash noted. She is not diaphoretic. No erythema. No pallor.  Stuck on hyperpigmented lesion on L cheek    Psychiatric: She has a normal mood and affect. Her speech is normal and behavior is normal. Judgment and thought content normal. Cognition and memory are  normal.  Nursing note and vitals reviewed.   Results for orders placed or performed in visit on 12/23/15  Microscopic Examination  Result Value Ref Range   WBC, UA 11-30 (A) 0 -  5 /hpf   RBC, UA 0-2 0 -  2 /hpf   Epithelial Cells (non renal) 0-10 0 - 10 /hpf   Bacteria, UA Few None seen/Few  UA/M w/rflx Culture, Routine  Result Value Ref Range   Specific Gravity, UA <1.005 (L) 1.005 - 1.030   pH, UA 5.5 5.0 - 7.5   Color, UA Yellow Yellow   Appearance Ur Cloudy (A) Clear   Leukocytes, UA 2+ (A) Negative   Protein, UA Negative Negative/Trace   Glucose, UA Negative Negative   Ketones, UA Negative Negative   RBC, UA Trace (A) Negative   Bilirubin, UA Negative Negative   Urobilinogen, Ur 0.2 0.2 - 1.0 mg/dL   Nitrite, UA Negative Negative   Microscopic Examination See below:    Urinalysis Reflex Comment   Urine Culture, Routine  Result Value Ref Range   Urine Culture, Routine Final report (A)    Urine Culture result 1 Escherichia coli (A)    ANTIMICROBIAL SUSCEPTIBILITY Comment       Assessment & Plan:   Problem List Items Addressed This Visit      Cardiovascular and Mediastinum   Hypertension    Under good control. Continue current regimen. Continue to monitor.       Relevant Medications   lisinopril-hydrochlorothiazide (PRINZIDE,ZESTORETIC) 20-25 MG tablet    Other Visit Diagnoses    Seborrheic keratoses    -  Primary    Would like to see derm. Referral made today.    Relevant Orders    Ambulatory referral to Dermatology    Cerumen impaction, right        Will treat with debrox for 3 days then will return to have it flushed.        Follow up plan: Return 3 days ear flushing.

## 2016-01-20 NOTE — Patient Instructions (Signed)
Seborrheic Keratosis Seborrheic keratosis is a common, noncancerous (benign) skin growth. This condition causes waxy, rough, tan, brown, or black spots to appear on the skin. These skin growths can be flat or raised. CAUSES The cause of this condition is not known. RISK FACTORS This condition is more likely to develop in:  People who have a family history of seborrheic keratosis.  People who are 50 or older.  People who are pregnant.  People who have had estrogen replacement therapy. SYMPTOMS This condition often occurs on the face, chest, shoulders, back, or other areas. These growths:  Are usually painless, but may become irritated and itchy.  Can be yellow, brown, black, or other colors.  Are slightly raised or have a flat surface.  Are sometimes rough or wart-like in texture.  Are often waxy on the surface.  Are round or oval-shaped.  Sometimes look like they are "stuck on."  Often occur in groups, but may occur as a single growth. DIAGNOSIS This condition is diagnosed with a medical history and physical exam. A sample of the growth may be tested (skin biopsy). You may need to see a skin specialist (dermatologist). TREATMENT Treatment is not usually needed for this condition, unless the growths are irritated or are often bleeding. You may also choose to have the growths removed if you do not like their appearance. Most commonly, these growths are treated with a procedure in which liquid nitrogen is applied to "freeze" off the growth (cryosurgery). They may also be burned off with electricity or cut off. HOME CARE INSTRUCTIONS  Watch your growth for any changes.  Keep all follow-up visits as told by your health care provider. This is important.  Do not scratch or pick at the growth or growths. This can cause them to become irritated or infected. SEEK MEDICAL CARE IF:  You suddenly have many new growths.  Your growth bleeds, itches, or hurts.  Your growth suddenly  becomes larger or changes color.   This information is not intended to replace advice given to you by your health care provider. Make sure you discuss any questions you have with your health care provider.   Document Released: 10/16/2010 Document Revised: 06/04/2015 Document Reviewed: 01/29/2015 Elsevier Interactive Patient Education 2016 [employer]  

## 2016-01-22 ENCOUNTER — Ambulatory Visit (INDEPENDENT_AMBULATORY_CARE_PROVIDER_SITE_OTHER): Payer: Medicare Other | Admitting: Family Medicine

## 2016-01-22 ENCOUNTER — Encounter: Payer: Self-pay | Admitting: Family Medicine

## 2016-01-22 VITALS — BP 140/68 | HR 53 | Temp 98.8°F | Ht 61.2 in | Wt 174.0 lb

## 2016-01-22 DIAGNOSIS — H6121 Impacted cerumen, right ear: Secondary | ICD-10-CM | POA: Diagnosis not present

## 2016-01-22 MED ORDER — AMOXICILLIN 500 MG PO CAPS: 500.0000 mg | ORAL_CAPSULE | Freq: Two times a day (BID) | ORAL | Status: DC

## 2016-01-22 NOTE — Progress Notes (Signed)
BP 140/68 mmHg  Pulse 53  Temp(Src) 98.8 F (37.1 C)  Ht 5' 1.2" (1.554 m)  Wt 174 lb (78.926 kg)  BMI 32.68 kg/m2  SpO2 96%  LMP  (Exact Date)   Subjective:    Patient ID: Kristina Henson, female    DOB: 06-13-1956, 60 y.o.   MRN: [id number]  HPI: Kristina Henson is a 60 y.o. female  Chief Complaint  Patient presents with  . Ear Fullness   EAR PAIN EAR PAIN- went to the beach for easter Duration: 2 weeks Involved ear(s): right Severity: severe  Quality: aching Fever: no Otorrhea: no Upper respiratory infection symptoms: no Pruritus: no Hearing loss: no Water immersion no Using Q-tips: no Recurrent otitis media: no Status: stable Treatments attempted: debrox  Relevant past medical, surgical, family and social history reviewed and updated as indicated. Interim medical history since our last visit reviewed. Allergies and medications reviewed and updated.  Review of Systems  Constitutional: Negative.   HENT: Negative.   Respiratory: Negative.   Cardiovascular: Negative.   Psychiatric/Behavioral: Negative.     Per HPI unless specifically indicated above     Objective:    BP 140/68 mmHg  Pulse 53  Temp(Src) 98.8 F (37.1 C)  Ht 5' 1.2" (1.554 m)  Wt 174 lb (78.926 kg)  BMI 32.68 kg/m2  SpO2 96%  LMP  (Exact Date)  Wt Readings from Last 3 Encounters:  01/22/16 174 lb (78.926 kg)  01/20/16 172 lb (78.019 kg)  12/23/15 177 lb (80.287 kg)    Physical Exam  Constitutional: She is oriented to person, place, and time. She appears well-developed and well-nourished. No distress.  HENT:  Head: Normocephalic and atraumatic.  Right Ear: Hearing normal.  Left Ear: Hearing and external ear normal.  Nose: Nose normal.  Mouth/Throat: Oropharynx is clear and moist. No oropharyngeal exudate.  Dry cerumen impaction in R ear  Eyes: Conjunctivae, EOM and lids are normal. Pupils are equal, round, and reactive to light. Right eye exhibits no discharge. Left eye  exhibits no discharge. No scleral icterus.  Pulmonary/Chest: Effort normal. No respiratory distress.  Musculoskeletal: Normal range of motion.  Neurological: She is alert and oriented to person, place, and time.  Skin: Skin is warm, dry and intact. No rash noted. She is not diaphoretic. No erythema. No pallor.  Psychiatric: She has a normal mood and affect. Her speech is normal and behavior is normal. Judgment and thought content normal. Cognition and memory are normal.    Results for orders placed or performed in visit on 12/23/15  Microscopic Examination  Result Value Ref Range   WBC, UA 11-30 (A) 0 -  5 /hpf   RBC, UA 0-2 0 -  2 /hpf   Epithelial Cells (non renal) 0-10 0 - 10 /hpf   Bacteria, UA Few None seen/Few  UA/M w/rflx Culture, Routine  Result Value Ref Range   Specific Gravity, UA <1.005 (L) 1.005 - 1.030   pH, UA 5.5 5.0 - 7.5   Color, UA Yellow Yellow   Appearance Ur Cloudy (A) Clear   Leukocytes, UA 2+ (A) Negative   Protein, UA Negative Negative/Trace   Glucose, UA Negative Negative   Ketones, UA Negative Negative   RBC, UA Trace (A) Negative   Bilirubin, UA Negative Negative   Urobilinogen, Ur 0.2 0.2 - 1.0 mg/dL   Nitrite, UA Negative Negative   Microscopic Examination See below:    Urinalysis Reflex Comment   Urine Culture, Routine  Result Value  Ref Range   Urine Culture, Routine Final report (A)    Urine Culture result 1 Escherichia coli (A)    ANTIMICROBIAL SUSCEPTIBILITY Comment       Assessment & Plan:   Problem List Items Addressed This Visit    None    Visit Diagnoses    Cerumen impaction, right    -  Primary    Unable to get any out at this time due to pain. Will continue debrox for another week and will try again. Will treat with amoxicillin in case AOM behind wax.         Follow up plan: Return Next week, for Ear flush.

## 2016-01-30 ENCOUNTER — Ambulatory Visit (INDEPENDENT_AMBULATORY_CARE_PROVIDER_SITE_OTHER): Payer: Medicare Other | Admitting: Family Medicine

## 2016-01-30 ENCOUNTER — Encounter: Payer: Self-pay | Admitting: Family Medicine

## 2016-01-30 VITALS — BP 127/74 | HR 61 | Temp 98.2°F | Wt 174.0 lb

## 2016-01-30 DIAGNOSIS — H6121 Impacted cerumen, right ear: Secondary | ICD-10-CM

## 2016-01-30 NOTE — Progress Notes (Signed)
BP 127/74 mmHg  Pulse 61  Temp(Src) 98.2 F (36.8 C)  Wt 174 lb (78.926 kg)  SpO2 96%  LMP  (Exact Date)   Subjective:    Patient ID: Kristina Henson, female    DOB: 08-13-1956, 60 y.o.   MRN: [id number]  HPI: Kristina Henson is a 60 y.o. female  Chief Complaint  Patient presents with  . Cerumen Impaction   EAG CLOGGED Duration: 1.5 weeks Involved ear(s):  "right Sensation of feeling clogged/plugged: yes Decreased/muffled hearing:yes Ear pain: yes - better than it was, no more severe pain Fever: no Otorrhea: no Hearing loss: no Upper respiratory infection symptoms: no Using Q-Tips: no Status: stable History of cerumenosis: no Treatments attempted: debrox, irrigation  Relevant past medical, surgical, family and social history reviewed and updated as indicated. Interim medical history since our last visit reviewed. Allergies and medications reviewed and updated.  Review of Systems  Constitutional: Negative.   HENT: Positive for ear pain. Negative for dental problem, drooling, ear discharge, facial swelling, hearing loss, mouth sores, nosebleeds, postnasal drip, rhinorrhea, sinus pressure, sneezing, sore throat, tinnitus, trouble swallowing and voice change.   Psychiatric/Behavioral: Negative.     Per HPI unless specifically indicated above     Objective:    BP 127/74 mmHg  Pulse 61  Temp(Src) 98.2 F (36.8 C)  Wt 174 lb (78.926 kg)  SpO2 96%  LMP  (Exact Date)  Wt Readings from Last 3 Encounters:  01/30/16 174 lb (78.926 kg)  01/22/16 174 lb (78.926 kg)  01/20/16 172 lb (78.019 kg)    Physical Exam  Constitutional: She is oriented to person, place, and time. She appears well-developed and well-nourished. No distress.  HENT:  Head: Normocephalic and atraumatic.  Right Ear: Hearing and external ear normal.  Left Ear: Hearing, tympanic membrane, external ear and ear canal normal.  Nose: Nose normal.  Mouth/Throat: Oropharynx is clear and moist. No  oropharyngeal exudate.  Cerumen impaction with hard cerumen. Patient unable to tolerate disimpaction today due to discomfort.  Eyes: Conjunctivae, EOM and lids are normal. Pupils are equal, round, and reactive to light. Right eye exhibits no discharge. Left eye exhibits no discharge. No scleral icterus.  Neck: Normal range of motion. Neck supple. No JVD present. No tracheal deviation present. No thyromegaly present.  Pulmonary/Chest: Effort normal. No stridor. No respiratory distress.  Musculoskeletal: Normal range of motion.  Lymphadenopathy:    She has no cervical adenopathy.  Neurological: She is alert and oriented to person, place, and time.  Skin: Skin is intact. No rash noted.  Psychiatric: She has a normal mood and affect. Her speech is normal and behavior is normal. Judgment and thought content normal. Cognition and memory are normal.  Nursing note and vitals reviewed.   Results for orders placed or performed in visit on 12/23/15  Microscopic Examination  Result Value Ref Range   WBC, UA 11-30 (A) 0 -  5 /hpf   RBC, UA 0-2 0 -  2 /hpf   Epithelial Cells (non renal) 0-10 0 - 10 /hpf   Bacteria, UA Few None seen/Few  UA/M w/rflx Culture, Routine  Result Value Ref Range   Specific Gravity, UA <1.005 (L) 1.005 - 1.030   pH, UA 5.5 5.0 - 7.5   Color, UA Yellow Yellow   Appearance Ur Cloudy (A) Clear   Leukocytes, UA 2+ (A) Negative   Protein, UA Negative Negative/Trace   Glucose, UA Negative Negative   Ketones, UA Negative Negative   RBC,  UA Trace (A) Negative   Bilirubin, UA Negative Negative   Urobilinogen, Ur 0.2 0.2 - 1.0 mg/dL   Nitrite, UA Negative Negative   Microscopic Examination See below:    Urinalysis Reflex Comment   Urine Culture, Routine  Result Value Ref Range   Urine Culture, Routine Final report (A)    Urine Culture result 1 Escherichia coli (A)    ANTIMICROBIAL SUSCEPTIBILITY Comment       Assessment & Plan:   Problem List Items Addressed This Visit     None    Visit Diagnoses    Cerumen impaction, right    -  Primary    Patient unable to tolerate cerumen removal in the office. Referral to ENT made. Call with concerns.     Relevant Orders    Ambulatory referral to ENT        Follow up plan: Return in about 6 months (around 08/01/2016) for Physical.

## 2016-02-13 ENCOUNTER — Other Ambulatory Visit: Payer: Self-pay | Admitting: Otolaryngology

## 2016-02-13 DIAGNOSIS — R221 Localized swelling, mass and lump, neck: Secondary | ICD-10-CM | POA: Diagnosis not present

## 2016-02-13 DIAGNOSIS — H6123 Impacted cerumen, bilateral: Secondary | ICD-10-CM | POA: Diagnosis not present

## 2016-02-17 ENCOUNTER — Ambulatory Visit
Admission: RE | Admit: 2016-02-17 | Discharge: 2016-02-17 | Disposition: A | Discharge status: Home / Self Care | Payer: Medicare Other | Source: Ambulatory Visit | Attending: Otolaryngology | Admitting: Otolaryngology

## 2016-02-17 DIAGNOSIS — R221 Localized swelling, mass and lump, neck: Secondary | ICD-10-CM

## 2016-02-17 DIAGNOSIS — E041 Nontoxic single thyroid nodule: Secondary | ICD-10-CM | POA: Diagnosis not present

## 2016-02-18 ENCOUNTER — Other Ambulatory Visit: Payer: Self-pay | Admitting: Otolaryngology

## 2016-02-18 DIAGNOSIS — R221 Localized swelling, mass and lump, neck: Secondary | ICD-10-CM

## 2016-02-26 ENCOUNTER — Ambulatory Visit
Admission: RE | Admit: 2016-02-26 | Discharge: 2016-02-26 | Disposition: A | Discharge status: Home / Self Care | Payer: Medicare Other | Source: Ambulatory Visit | Attending: Otolaryngology | Admitting: Otolaryngology

## 2016-02-26 DIAGNOSIS — R221 Localized swelling, mass and lump, neck: Secondary | ICD-10-CM | POA: Diagnosis not present

## 2016-02-26 MED ORDER — IOPAMIDOL (ISOVUE-300) INJECTION 61%
75.0000 mL | Freq: Once | INTRAVENOUS | Status: AC | PRN
  Administered 2016-02-26: 75 mL via INTRAVENOUS

## 2016-02-27 DIAGNOSIS — L821 Other seborrheic keratosis: Secondary | ICD-10-CM | POA: Diagnosis not present

## 2016-02-27 DIAGNOSIS — D1801 Hemangioma of skin and subcutaneous tissue: Secondary | ICD-10-CM | POA: Diagnosis not present

## 2016-03-10 ENCOUNTER — Encounter: Payer: Self-pay | Admitting: Family Medicine

## 2016-03-10 ENCOUNTER — Ambulatory Visit (INDEPENDENT_AMBULATORY_CARE_PROVIDER_SITE_OTHER): Payer: Medicare Other | Admitting: Family Medicine

## 2016-03-10 VITALS — BP 138/85 | HR 91 | Temp 98.5°F | Wt 174.0 lb

## 2016-03-10 DIAGNOSIS — M2550 Pain in unspecified joint: Secondary | ICD-10-CM

## 2016-03-10 MED ORDER — MELOXICAM 15 MG PO TABS: 15.0000 mg | ORAL_TABLET | Freq: Every day | ORAL | Status: DC

## 2016-03-10 NOTE — Progress Notes (Signed)
BP 138/85 mmHg  Pulse 91  Temp(Src) 98.5 F (36.9 C)  Wt 174 lb (78.926 kg)  SpO2 98%  LMP  (Exact Date)   Subjective:    Patient ID: Kristina Henson, female    DOB: Mar 20, 1956, 60 y.o.   MRN: [id number]  HPI: Kristina Henson is a 60 y.o. female  Chief Complaint  Patient presents with  . Joint Pain    Patient states that it started in the middle of May, it is in her wrists and elbows   ARTHRALGIAS / JOINT ACHES- elbows, wrists, migratory Duration: couple of weeks Pain: yes Symmetric: no  10/10 Quality: aching Frequency: intermittent- happens at night mainly, occasionally at night Context:  worse Decreased function/range of motion: yes, grip worse, trouble getting out of bed because of elbow Erythema: no Swelling:  no Heat or warmth: no Morning stiffness:  yes Aggravating factors: none   Alleviating factors: muscle cream Relief with NSAIDs?: no Treatments attempted:  muscle cream, rest, heat and ibuprofen  Involved Joints:     Hands: yes bilateral    Wrists: yes bilateral     Elbows: yes bilateral    Shoulders: no     Back: yes     Hips: no     Knees: no     Ankles: no     Feet: no    Relevant past medical, surgical, family and social history reviewed and updated as indicated. Interim medical history since our last visit reviewed. Allergies and medications reviewed and updated.  Review of Systems  Constitutional: Negative.   Respiratory: Negative.   Cardiovascular: Negative.   Musculoskeletal: Positive for myalgias and arthralgias. Negative for back pain, joint swelling, gait problem, neck pain and neck stiffness.  Psychiatric/Behavioral: Negative.     Per HPI unless specifically indicated above     Objective:    BP 138/85 mmHg  Pulse 91  Temp(Src) 98.5 F (36.9 C)  Wt 174 lb (78.926 kg)  SpO2 98%  LMP  (Exact Date)  Wt Readings from Last 3 Encounters:  03/10/16 174 lb (78.926 kg)  01/30/16 174 lb (78.926 kg)  01/22/16 174 lb (78.926 kg)     Physical Exam  Constitutional: She is oriented to person, place, and time. She appears well-developed and well-nourished. No distress.  HENT:  Head: Normocephalic and atraumatic.  Right Ear: Hearing normal.  Left Ear: Hearing normal.  Nose: Nose normal.  Eyes: Conjunctivae and lids are normal. Right eye exhibits no discharge. Left eye exhibits no discharge. No scleral icterus.  Pulmonary/Chest: Effort normal. No respiratory distress.  Musculoskeletal: Normal range of motion.  No swelling, no redness, tenderness to light palpation of elbows and L shoulder. No deformity  Neurological: She is alert and oriented to person, place, and time.  Skin: Skin is warm, dry and intact. No rash noted. No erythema. No pallor.  Psychiatric: She has a normal mood and affect. Her speech is normal and behavior is normal. Judgment and thought content normal. Cognition and memory are normal.  Nursing note and vitals reviewed.       Assessment & Plan:   Problem List Items Addressed This Visit    None    Visit Diagnoses    Joint pain    -  Primary    Likely OA. Will check for tick diseases and PMR. Await results. Meloxicam for comfort. Follow up in 3 weeks. Call with concerns.     Relevant Orders    Lyme Ab/Western Blot Reflex  Rocky mtn spotted fvr abs pnl(IgG+IgM)    Ehrlichia Antibody Panel    Babesia microti Antibody Panel    Sed Rate (ESR)    Antinuclear Antib (ANA)    Rheumatoid Factor    Comprehensive metabolic panel        Follow up plan: Return in about 3 weeks (around 03/31/2016) for follow up joint pain.

## 2016-03-12 ENCOUNTER — Telehealth: Payer: Self-pay | Admitting: Family Medicine

## 2016-03-12 DIAGNOSIS — D37032 Neoplasm of uncertain behavior of the submandibular salivary glands: Secondary | ICD-10-CM | POA: Diagnosis not present

## 2016-03-12 LAB — COMPREHENSIVE METABOLIC PANEL
A/G RATIO: 1.2 (ref 1.2–2.2)
ALK PHOS: 77 IU/L (ref 39–117)
ALT: 21 IU/L (ref 0–32)
AST: 23 IU/L (ref 0–40)
Albumin: 3.7 g/dL (ref 3.5–5.5)
BUN/Creatinine Ratio: 25 — ABNORMAL HIGH (ref 9–23)
BUN: 22 mg/dL (ref 6–24)
Bilirubin Total: 0.3 mg/dL (ref 0.0–1.2)
CO2: 23 mmol/L (ref 18–29)
Calcium: 9.3 mg/dL (ref 8.7–10.2)
Chloride: 98 mmol/L (ref 96–106)
Creatinine, Ser: 0.89 mg/dL (ref 0.57–1.00)
GFR calc Af Amer: 83 mL/min/{1.73_m2} (ref >59)
GFR calc non Af Amer: 72 mL/min/{1.73_m2} (ref >59)
GLOBULIN, TOTAL: 3.1 g/dL (ref 1.5–4.5)
Glucose: 106 mg/dL — ABNORMAL HIGH (ref 65–99)
POTASSIUM: 3.8 mmol/L (ref 3.5–5.2)
SODIUM: 139 mmol/L (ref 134–144)
Total Protein: 6.8 g/dL (ref 6.0–8.5)

## 2016-03-12 LAB — ANA: ANA: NEGATIVE

## 2016-03-12 LAB — RHEUMATOID FACTOR

## 2016-03-12 LAB — EHRLICHIA ANTIBODY PANEL
E. Chaffeensis (HME) IgM Titer: NEGATIVE
E.Chaffeensis (HME) IgG: NEGATIVE
HGE IGM TITER: NEGATIVE
HGE IgG Titer: NEGATIVE

## 2016-03-12 LAB — LYME AB/WESTERN BLOT REFLEX

## 2016-03-12 LAB — BABESIA MICROTI ANTIBODY PANEL: Babesia microti IgG: <1:10 {titer}

## 2016-03-12 LAB — ROCKY MTN SPOTTED FVR ABS PNL(IGG+IGM)
RMSF IGM: 1.46 {index} — AB (ref 0.00–0.89)
RMSF IgG: NEGATIVE

## 2016-03-12 LAB — SEDIMENTATION RATE: SED RATE: 51 mm/h — AB (ref 0–40)

## 2016-03-12 MED ORDER — DOXYCYCLINE HYCLATE 100 MG PO TABS: 100.0000 mg | ORAL_TABLET | Freq: Two times a day (BID) | ORAL | Status: DC

## 2016-03-12 NOTE — Telephone Encounter (Signed)
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 I will send her through doxycycline.

## 2016-03-12 NOTE — Telephone Encounter (Signed)
Patient notified

## 2016-04-01 ENCOUNTER — Encounter: Payer: Self-pay | Admitting: Family Medicine

## 2016-04-01 ENCOUNTER — Ambulatory Visit (INDEPENDENT_AMBULATORY_CARE_PROVIDER_SITE_OTHER): Payer: Medicare Other | Admitting: Family Medicine

## 2016-04-01 VITALS — BP 112/65 | HR 69 | Temp 97.5°F | Wt 171.0 lb

## 2016-04-01 DIAGNOSIS — Z01818 Encounter for other preprocedural examination: Secondary | ICD-10-CM | POA: Diagnosis not present

## 2016-04-01 DIAGNOSIS — A770 Spotted fever due to Rickettsia rickettsii: Secondary | ICD-10-CM

## 2016-04-01 NOTE — Progress Notes (Signed)
BP 112/65 mmHg  Pulse 69  Temp(Src) 97.5 F (36.4 C)  Wt 171 lb (77.565 kg)  SpO2 99%  LMP  (Exact Date)   Subjective:    Patient ID: Kristina Henson, female    DOB: 1956-06-02, 60 y.o.   MRN: [id number]  HPI: Kristina Henson is a 60 y.o. female  Chief Complaint  Patient presents with  . 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 She notes that she is feeling much better. Still having some pain in her wrist. Doing better with it.   Duration: weeks Pain: yes Symmetric: no Severity:  moderate Quality: dull and aching Frequency: constant Context:  better Decreased function/range of motion: No Erythema: No Swelling: No Heat or warmth: No Morning stiffness: No Aggravating factors: Nothing Alleviating factors: medicine Relief with NSAIDs?: moderate Involved Joints:     Hands: yes right    Wrists: yes right     Elbows: no     Shoulders: no     Back: no     Hips: no     Knees: no     Ankles: no     Feet: yes   To have a submandibular mass removed at the end of July. She has never had problems with surgery before. Does get very anxious about surgery and is afraid she is not going to wake up again. She has generally been feeling well with no chest pains or SOB. Generally feeling well.   Relevant past medical, surgical, family and social history reviewed and updated as indicated. Interim medical history since our last visit reviewed. Allergies and medications reviewed and updated.  Review of Systems  Constitutional: Negative.   Respiratory: Negative.   Cardiovascular: Negative.   Musculoskeletal: Positive for arthralgias. Negative for myalgias, back pain, joint swelling, gait problem, neck pain and neck stiffness.  Psychiatric/Behavioral: Negative.    Per HPI unless specifically indicated above     Objective:    BP 112/65 mmHg  Pulse 69  Temp(Src) 97.5 F (36.4 C)  Wt 171 lb (77.565 kg)  SpO2 99%   LMP  (Exact Date)  Wt Readings from Last 3 Encounters:  04/01/16 171 lb (77.565 kg)  03/10/16 174 lb (78.926 kg)  01/30/16 174 lb (78.926 kg)    Physical Exam  Constitutional: She is oriented to person, place, and time. She appears well-developed and well-nourished. No distress.  HENT:  Head: Normocephalic and atraumatic.  Right Ear: Hearing normal.  Left Ear: Hearing normal.  Nose: Nose normal.  Eyes: Conjunctivae and lids are normal. Right eye exhibits no discharge. Left eye exhibits no discharge. No scleral icterus.  Cardiovascular: Normal rate, regular rhythm, normal heart sounds and intact distal pulses.  Exam reveals no gallop and no friction rub.   No murmur heard. Pulmonary/Chest: Effort normal and breath sounds normal. No respiratory distress. She has no wheezes. She has no rales. She exhibits no tenderness.  Musculoskeletal: Normal range of motion.  Neurological: She is alert and oriented to person, place, and time.  Skin: Skin is warm, dry and intact. No rash noted. She is not diaphoretic. No erythema. No pallor.  Psychiatric: She has a normal mood and affect. Her speech is normal and behavior is normal. Judgment and thought content normal. Cognition and memory are normal.  Nursing note and vitals reviewed.   Results for orders placed or performed in visit on 03/10/16  Lyme Ab/Western Blot Reflex  Result  Value Ref Range   Lyme IgG/IgM Ab <0.91 0.00 - 0.90 ISR   LYME DISEASE AB, QUANT, IGM <0.80 0.00 - 0.79 index  Rocky mtn spotted fvr abs pnl(IgG+IgM)  Result Value Ref Range   RMSF IgG Negative Negative   RMSF IgM 1.46 (H) 0.00 - 3.84 index  Ehrlichia Antibody Panel  Result Value Ref Range   E.Chaffeensis (HME) IgG Negative Neg:<1:64   E. Chaffeensis (HME) IgM Titer Negative Neg:<1:20   HGE IgG Titer Negative Neg:<1:64   HGE IgM Titer Negative Neg:<1:20  Babesia microti Antibody Panel  Result Value Ref Range   Babesia microti IgM <1:10 Neg:<1:10   Babesia microti  IgG <1:10 Neg:<1:10  Sed Rate (ESR)  Result Value Ref Range   Sed Rate 51 (H) 0 - 40 mm/hr  Antinuclear Antib (ANA)  Result Value Ref Range   Anit Nuclear Antibody(ANA) Negative Negative  Rheumatoid Factor  Result Value Ref Range   Rhuematoid fact SerPl-aCnc <10.0 0.0 - 13.9 IU/mL  Comprehensive metabolic panel  Result Value Ref Range   Glucose 106 (H) 65 - 99 mg/dL   BUN 22 6 - 24 mg/dL   Creatinine, Ser 0.89 0.57 - 1.00 mg/dL   GFR calc non Af Amer 72 >59 mL/min/1.73   GFR calc Af Amer 83 >59 mL/min/1.73   BUN/Creatinine Ratio 25 (H) 9 - 23   Sodium 139 134 - 144 mmol/L   Potassium 3.8 3.5 - 5.2 mmol/L   Chloride 98 96 - 106 mmol/L   CO2 23 18 - 29 mmol/L   Calcium 9.3 8.7 - 10.2 mg/dL   Total Protein 6.8 6.0 - 8.5 g/dL   Albumin 3.7 3.5 - 5.5 g/dL   Globulin, Total 3.1 1.5 - 4.5 g/dL   Albumin/Globulin Ratio 1.2 1.2 - 2.2   Bilirubin Total 0.3 0.0 - 1.2 mg/dL   Alkaline Phosphatase 77 39 - 117 IU/L   AST 23 0 - 40 IU/L   ALT 21 0 - 32 IU/L      Assessment & Plan:   Problem List Items Addressed This Visit    None    Visit Diagnoses    RMSF [facility] spotted fever)    -  Primary    Improving, still on her medication. Continue meloxicam. Call with any concerns.     Preop examination        Will need to see cardiology for cardiac clearance, but pending CBC, should be cleared from a non-cardiac standpoint for surgery.    Relevant Orders    CBC with Differential/Platelet        Follow up plan: Return End of September, for Wellness exam.

## 2016-04-02 ENCOUNTER — Telehealth: Payer: Self-pay | Admitting: Family Medicine

## 2016-04-02 DIAGNOSIS — D649 Anemia, unspecified: Secondary | ICD-10-CM

## 2016-04-02 LAB — CBC WITH DIFFERENTIAL/PLATELET
Basophils Absolute: 0 10*3/uL (ref 0.0–0.2)
Basos: 0 %
EOS (ABSOLUTE): 0 10*3/uL (ref 0.0–0.4)
EOS: 0 %
Hematocrit: 32.1 % — ABNORMAL LOW (ref 34.0–46.6)
Hemoglobin: 9.8 g/dL — ABNORMAL LOW (ref 11.1–15.9)
IMMATURE GRANS (ABS): 0 10*3/uL (ref 0.0–0.1)
IMMATURE GRANULOCYTES: 0 %
LYMPHS: 20 %
Lymphocytes Absolute: 0.9 10*3/uL (ref 0.7–3.1)
MCH: 25.3 pg — ABNORMAL LOW (ref 26.6–33.0)
MCHC: 30.5 g/dL — ABNORMAL LOW (ref 31.5–35.7)
MCV: 83 fL (ref 79–97)
MONOS ABS: 0.3 10*3/uL (ref 0.1–0.9)
Monocytes: 6 %
NEUTROS PCT: 74 %
Neutrophils Absolute: 3.4 10*3/uL (ref 1.4–7.0)
PLATELETS: 179 10*3/uL (ref 150–379)
RBC: 3.87 x10E6/uL (ref 3.77–5.28)
RDW: 16.4 % — AB (ref 12.3–15.4)
WBC: 4.7 10*3/uL (ref 3.4–10.8)

## 2016-04-02 MED ORDER — FERROUS SULFATE 325 (65 FE) MG PO TBEC: 325.0000 mg | DELAYED_RELEASE_TABLET | Freq: Three times a day (TID) | ORAL | Status: DC

## 2016-04-02 NOTE — Telephone Encounter (Signed)
Please let Kristina Henson know that she is pretty anemic! I think it's probably from low iron, but I can't clear her for surgery until her numbers come up a little bit. I'd like her to come in for some iron tests (orders in) and start an iron pill (Rx sent to her pharmacy) and we'll see if it's come up in the next week or so so she should be OK to have the surgery at the end of July. Thank you!

## 2016-04-02 NOTE — Telephone Encounter (Signed)
Patient notified

## 2016-04-07 ENCOUNTER — Other Ambulatory Visit: Payer: Medicare Other

## 2016-04-07 ENCOUNTER — Telehealth: Payer: Self-pay | Admitting: Family Medicine

## 2016-04-07 DIAGNOSIS — I10 Essential (primary) hypertension: Secondary | ICD-10-CM | POA: Diagnosis not present

## 2016-04-07 DIAGNOSIS — Z79899 Other long term (current) drug therapy: Secondary | ICD-10-CM

## 2016-04-07 DIAGNOSIS — D649 Anemia, unspecified: Secondary | ICD-10-CM

## 2016-04-07 DIAGNOSIS — R350 Frequency of micturition: Secondary | ICD-10-CM

## 2016-04-07 MED ORDER — SULFAMETHOXAZOLE-TRIMETHOPRIM 800-160 MG PO TABS: 1.0000 | ORAL_TABLET | Freq: Two times a day (BID) | ORAL | Status: DC

## 2016-04-07 NOTE — Telephone Encounter (Signed)
Spoke with patient.

## 2016-04-07 NOTE — Telephone Encounter (Signed)
Please let her know that she has a UTI and I sent her through some medicine to her pharmacy.

## 2016-04-08 ENCOUNTER — Other Ambulatory Visit: Payer: Self-pay | Admitting: Family Medicine

## 2016-04-08 ENCOUNTER — Encounter: Payer: Self-pay | Admitting: Family Medicine

## 2016-04-08 DIAGNOSIS — D509 Iron deficiency anemia, unspecified: Secondary | ICD-10-CM

## 2016-04-08 LAB — BASIC METABOLIC PANEL
BUN / CREAT RATIO: 34 — AB (ref 9–23)
BUN: 27 mg/dL — AB (ref 6–24)
CALCIUM: 9 mg/dL (ref 8.7–10.2)
CHLORIDE: 99 mmol/L (ref 96–106)
CO2: 24 mmol/L (ref 18–29)
CREATININE: 0.79 mg/dL (ref 0.57–1.00)
GFR, EST AFRICAN AMERICAN: 95 mL/min/{1.73_m2} (ref >59)
GFR, EST NON AFRICAN AMERICAN: 83 mL/min/{1.73_m2} (ref >59)
Glucose: 95 mg/dL (ref 65–99)
Potassium: 3.8 mmol/L (ref 3.5–5.2)
Sodium: 139 mmol/L (ref 134–144)

## 2016-04-08 LAB — IRON AND TIBC
IRON SATURATION: 11 % — AB (ref 15–55)
IRON: 22 ug/dL — AB (ref 27–159)
Total Iron Binding Capacity: 204 ug/dL — ABNORMAL LOW (ref 250–450)
UIBC: 182 ug/dL (ref 131–425)

## 2016-04-08 LAB — FERRITIN: FERRITIN: 161 ng/mL — AB (ref 15–150)

## 2016-04-10 LAB — UA/M W/RFLX CULTURE, ROUTINE
Bilirubin, UA: NEGATIVE
Glucose, UA: NEGATIVE
Ketones, UA: NEGATIVE
NITRITE UA: POSITIVE — AB
PH UA: 7 (ref 5.0–7.5)
PROTEIN UA: NEGATIVE
RBC, UA: NEGATIVE
SPEC GRAV UA: 1.01 (ref 1.005–1.030)
Urobilinogen, Ur: 0.2 mg/dL (ref 0.2–1.0)

## 2016-04-10 LAB — MICROSCOPIC EXAMINATION

## 2016-04-10 LAB — URINE CULTURE, REFLEX

## 2016-04-13 DIAGNOSIS — I10 Essential (primary) hypertension: Secondary | ICD-10-CM | POA: Diagnosis not present

## 2016-04-13 DIAGNOSIS — E782 Mixed hyperlipidemia: Secondary | ICD-10-CM | POA: Diagnosis not present

## 2016-04-13 DIAGNOSIS — R0789 Other chest pain: Secondary | ICD-10-CM | POA: Diagnosis not present

## 2016-04-13 DIAGNOSIS — I480 Paroxysmal atrial fibrillation: Secondary | ICD-10-CM | POA: Diagnosis not present

## 2016-04-14 ENCOUNTER — Other Ambulatory Visit: Payer: Medicare Other

## 2016-04-14 ENCOUNTER — Encounter: Payer: Self-pay | Admitting: *Deleted

## 2016-04-14 NOTE — Patient Instructions (Signed)
  Your procedure is scheduled on: 04-22-16  Report to Same Day Surgery 2nd floor medical mall To find out your arrival time please call [phone] between 1PM - 3PM on 04-21-16  Remember: Instructions that are not followed completely may result in serious medical risk, up to and including death, or upon the discretion of your surgeon and anesthesiologist your surgery may need to be rescheduled.    _x___ 1. Do not eat food or drink liquids after midnight. No gum chewing or hard candies.     __x__ 2. No Alcohol for 24 hours before or after surgery.   __x__3. No Smoking for 24 prior to surgery.   ____  4. Bring all medications with you on the day of surgery if instructed.    __x__ 5. Notify your doctor if there is any change in your medical condition     (cold, fever, infections).     Do not wear jewelry, make-up, hairpins, clips or nail polish.  Do not wear lotions, powders, or perfumes. You may wear deodorant.  Do not shave 48 hours prior to surgery. Men may shave face and neck.  Do not bring valuables to the hospital.    [facility] is not responsible for any belongings or valuables.               Contacts, dentures or bridgework may not be worn into surgery.  Leave your suitcase in the car. After surgery it may be brought to your room.  For patients admitted to the hospital, discharge time is determined by your treatment team.   Patients discharged the day of surgery will not be allowed to drive home.    Please read over the following fact sheets that you were given:   [facility] Preparing for Surgery and or MRSA Information   _x___ Take these medicines the morning of surgery with A SIP OF WATER:    1. AMIODARONE  2. HYDRALAZINE  3.  4.  5.  6.  ____ Fleet Enema (as directed)   ____ Use CHG Soap or sage wipes as directed on instruction sheet   ____ Use inhalers on the day of surgery and bring to hospital day of surgery  ____ Stop metformin 2 days prior to  surgery    ____ Take 1/2 of usual insulin dose the night before surgery and none on the morning of surgery.   _X___ Stop aspirin or coumadin, or plavix-PT STATES DR FATH TOLD HER IT WAS OK TO STOP ASA-PT STOPPED ON 04-11-16  _x__ Stop Anti-inflammatories such as Advil, Aleve, Ibuprofen, Motrin, Naproxen,          Naprosyn, Goodies powders or aspirin products. Ok to take Tylenol.   ____ Stop supplements until after surgery.    ____ Bring C-Pap to the hospital.

## 2016-04-16 ENCOUNTER — Other Ambulatory Visit: Payer: Medicare Other

## 2016-04-16 DIAGNOSIS — D509 Iron deficiency anemia, unspecified: Secondary | ICD-10-CM

## 2016-04-17 LAB — CBC WITH DIFFERENTIAL/PLATELET
BASOS ABS: 0 10*3/uL (ref 0.0–0.2)
Basos: 0 %
EOS (ABSOLUTE): 0 10*3/uL (ref 0.0–0.4)
Eos: 0 %
HEMOGLOBIN: 9.4 g/dL — AB (ref 11.1–15.9)
Hematocrit: 29.7 % — ABNORMAL LOW (ref 34.0–46.6)
Immature Grans (Abs): 0 10*3/uL (ref 0.0–0.1)
Immature Granulocytes: 1 %
LYMPHS ABS: 0.8 10*3/uL (ref 0.7–3.1)
Lymphs: 18 %
MCH: 26.4 pg — AB (ref 26.6–33.0)
MCHC: 31.6 g/dL (ref 31.5–35.7)
MCV: 83 fL (ref 79–97)
MONOCYTES: 3 %
Monocytes Absolute: 0.1 10*3/uL (ref 0.1–0.9)
NEUTROS ABS: 3.4 10*3/uL (ref 1.4–7.0)
Neutrophils: 78 %
Platelets: 182 10*3/uL (ref 150–379)
RBC: 3.56 x10E6/uL — ABNORMAL LOW (ref 3.77–5.28)
RDW: 16.9 % — ABNORMAL HIGH (ref 12.3–15.4)
WBC: 4.4 10*3/uL (ref 3.4–10.8)

## 2016-04-19 ENCOUNTER — Telehealth: Payer: Self-pay | Admitting: Family Medicine

## 2016-04-19 DIAGNOSIS — R0789 Other chest pain: Secondary | ICD-10-CM | POA: Diagnosis not present

## 2016-04-19 DIAGNOSIS — D649 Anemia, unspecified: Secondary | ICD-10-CM | POA: Insufficient documentation

## 2016-04-19 NOTE — Telephone Encounter (Signed)
Please let Kristina Henson know that her iron got worse rather than better. She will need to see the blood doctor to get iron, but we also will need to see the stomach doctor to see where she is bleeding from. We will have to hold on her surgery unless her surgeon can transfuse her until her Hgb is above 10. Any questions, let me know. Thanks!

## 2016-04-20 ENCOUNTER — Telehealth: Payer: Self-pay

## 2016-04-20 NOTE — Telephone Encounter (Signed)
Forward to Keri  

## 2016-04-20 NOTE — Telephone Encounter (Signed)
Kerrie from [facility] called with an urgent referral. Patient has history of Gastric Cancer and has anemia at this time. Hgb was 9.8 - 2 weeks ago and is currently 9.4.  Please call Christie Beckers with an appointment for patient. Her number is [phone].

## 2016-04-20 NOTE — Telephone Encounter (Signed)
Dr. Wynetta Emery, could you enter in an urgent referral. I'll direct it to them and then call. This will get her in faster. And see if theres an ICD10 for transfusion.

## 2016-04-20 NOTE — Telephone Encounter (Signed)
Pt scheduled for an urgent appt with Dr. Allen Norris on 04/21/16 at 8:30am per Dr. Allen Norris.

## 2016-04-20 NOTE — Telephone Encounter (Signed)
I think from the cancer center

## 2016-04-20 NOTE — Telephone Encounter (Signed)
Patient notified.   Keri: Please call patient and get her scheduled for a transfusion Patient is scheduled for surgery on Thursday, so she would like to try and have this done as soon as possible.

## 2016-04-20 NOTE — Telephone Encounter (Signed)
She does not likely qualify for a transfusion. I do not want her to see them for a transfusion. She may need an iron infusion, but that would be due to iron deficiency anemia unresponsive to PO iron. Urgent referrals put in.

## 2016-04-20 NOTE — Telephone Encounter (Signed)
After speaking with the [place] and Dr. Donalda Ewings reviewing her notes he will see her August 4th.  They explained that they don't classify this as urgent unless their Hgb is under 7.  This is the soonest they could get her in.

## 2016-04-20 NOTE — Telephone Encounter (Signed)
Will she get this transfusion from the cancer center or gastro?

## 2016-04-20 NOTE — Telephone Encounter (Signed)
Unsure if she will need a transfusion. She will likely need an iron infusion. That will be through the cancer center at their discretion.

## 2016-04-21 ENCOUNTER — Ambulatory Visit (INDEPENDENT_AMBULATORY_CARE_PROVIDER_SITE_OTHER): Payer: Medicare Other | Admitting: Gastroenterology

## 2016-04-21 ENCOUNTER — Encounter: Payer: Self-pay | Admitting: Gastroenterology

## 2016-04-21 ENCOUNTER — Encounter: Payer: Self-pay | Admitting: *Deleted

## 2016-04-21 ENCOUNTER — Other Ambulatory Visit: Payer: Self-pay | Admitting: Otolaryngology

## 2016-04-21 ENCOUNTER — Other Ambulatory Visit: Payer: Self-pay

## 2016-04-21 VITALS — BP 150/67 | HR 56 | Temp 97.7°F | Ht 62.0 in | Wt 170.5 lb

## 2016-04-21 DIAGNOSIS — R109 Unspecified abdominal pain: Secondary | ICD-10-CM | POA: Diagnosis not present

## 2016-04-21 DIAGNOSIS — D509 Iron deficiency anemia, unspecified: Secondary | ICD-10-CM | POA: Diagnosis not present

## 2016-04-21 NOTE — Progress Notes (Signed)
Primary Care Physician: Park Liter, DO  Primary Gastroenterologist:  Dr. Lucilla Lame  Chief Complaint  Patient presents with  . Anemia    HPI: Kristina Henson is a 60 y.o. female here with a report of having anemia. The patient has been anemic since last year with a hemoglobin of 10.9 but has come down recently. The patient states she has been on iron without response. The patient's iron saturation is low as well as her iron level. The patient denies any sign of any bleeding. She also had a colonoscopy in February with a polyp removed at that time. His report that she had been bitten by a tic last month and was ill from that but otherwise had been doing well. She has had intermittent left-sided abdominal pain.  Current Outpatient Prescriptions  Medication Sig Dispense Refill  . amiodarone (PACERONE) 200 MG tablet Take 200 mg by mouth every morning.     Marland Kitchen aspirin EC 81 MG tablet Take 81 mg by mouth daily.     Marland Kitchen CRANBERRY PO Take 1 tablet by mouth daily.    . ferrous sulfate 325 (65 FE) MG EC tablet Take 1 tablet (325 mg total) by mouth 3 (three) times daily with meals. 90 tablet 1  . hydrALAZINE (APRESOLINE) 50 MG tablet Take 50 mg by mouth 2 (two) times daily.    Marland Kitchen lisinopril-hydrochlorothiazide (PRINZIDE,ZESTORETIC) 20-25 MG tablet Take 2 tablets by mouth daily. (Patient taking differently: Take 2 tablets by mouth every morning. ) 180 tablet 1  . Multiple Vitamin (MULTIVITAMIN) tablet Take 1 tablet by mouth daily. Reported on 04/01/2016     No current facility-administered medications for this visit.     Allergies as of 04/21/2016 - Review Complete 04/21/2016  Allergen Reaction Noted  . Amoxicillin Other (See Comments) 01/30/2016    ROS:  General: Negative for anorexia, weight loss, fever, chills, fatigue, weakness. ENT: Negative for hoarseness, difficulty swallowing , nasal congestion. CV: Negative for chest pain, angina, palpitations, dyspnea on exertion, peripheral edema.    Respiratory: Negative for dyspnea at rest, dyspnea on exertion, cough, sputum, wheezing.  GI: See history of present illness. GU:  Negative for dysuria, hematuria, urinary incontinence, urinary frequency, nocturnal urination.  Endo: Negative for unusual weight change.    Physical Examination:   BP (!) 150/67   Pulse (!) 56   Temp 97.7 F (36.5 C) (Oral)   Ht 5\' 2"  (1.575 m)   Wt 170 lb 8 oz (77.3 kg)   LMP  (Exact Date)   BMI 31.18 kg/m   General: Well-nourished, well-developed in no acute distress.  Eyes: No icterus. Conjunctivae pink. Mouth: Oropharyngeal mucosa moist and pink , no lesions erythema or exudate. Lungs: Clear to auscultation bilaterally. Non-labored. Heart: Regular rate and rhythm, no murmurs rubs or gallops.  Abdomen: Bowel sounds are normal, nontender, nondistended, no hepatosplenomegaly or masses, no abdominal bruits or hernia , no rebound or guarding.   Extremities: No lower extremity edema. No clubbing or deformities. Neuro: Alert and oriented x 3.  Grossly intact. Skin: Warm and dry, no jaundice.   Psych: Alert and cooperative, normal mood and affect.  Labs:    Imaging Studies: No results found.  Assessment and Plan:   Kristina Henson is a 60 y.o. y/o female who comes in today with a history of anemia. The patient's iron studies were also low. The patient had a colonoscopy with a polyp removed. The patient will be set up for a upper endoscopy. She will  also have her blood sent off for a celiac sprue panel. The patient has been explained the plan and agrees with it.   Note: This dictation was prepared with Dragon dictation along with smaller phrase technology. Any transcriptional errors that result from this process are unintentional.

## 2016-04-22 ENCOUNTER — Encounter: Admission: RE | Payer: Self-pay | Source: Ambulatory Visit

## 2016-04-22 ENCOUNTER — Ambulatory Visit: Admission: RE | Admit: 2016-04-22 | Payer: Medicare Other | Source: Ambulatory Visit | Admitting: Otolaryngology

## 2016-04-22 HISTORY — DX: Anemia, unspecified: D64.9

## 2016-04-22 HISTORY — DX: Pneumonia, unspecified organism: J18.9

## 2016-04-22 HISTORY — DX: Unspecified osteoarthritis, unspecified site: M19.90

## 2016-04-22 SURGERY — EXCISION, SUBMANDIBULAR GLAND
Anesthesia: General | Laterality: Left

## 2016-04-22 NOTE — Discharge Instructions (Signed)

## 2016-04-23 ENCOUNTER — Other Ambulatory Visit: Payer: Self-pay | Admitting: Otolaryngology

## 2016-04-23 ENCOUNTER — Ambulatory Visit: Payer: Medicare Other | Admitting: Anesthesiology

## 2016-04-23 ENCOUNTER — Encounter
Admission: RE | Disposition: A | Discharge status: Home / Self Care | Payer: Self-pay | Source: Ambulatory Visit | Attending: Gastroenterology

## 2016-04-23 ENCOUNTER — Ambulatory Visit
Admission: RE | Admit: 2016-04-23 | Discharge: 2016-04-23 | Disposition: A | Discharge status: Home / Self Care | Payer: Medicare Other | Source: Ambulatory Visit | Attending: Gastroenterology | Admitting: Gastroenterology

## 2016-04-23 DIAGNOSIS — I4891 Unspecified atrial fibrillation: Secondary | ICD-10-CM | POA: Insufficient documentation

## 2016-04-23 DIAGNOSIS — Z8673 Personal history of transient ischemic attack (TIA), and cerebral infarction without residual deficits: Secondary | ICD-10-CM | POA: Insufficient documentation

## 2016-04-23 DIAGNOSIS — K2970 Gastritis, unspecified, without bleeding: Secondary | ICD-10-CM

## 2016-04-23 DIAGNOSIS — Z90710 Acquired absence of both cervix and uterus: Secondary | ICD-10-CM | POA: Insufficient documentation

## 2016-04-23 DIAGNOSIS — D649 Anemia, unspecified: Secondary | ICD-10-CM | POA: Insufficient documentation

## 2016-04-23 DIAGNOSIS — I517 Cardiomegaly: Secondary | ICD-10-CM | POA: Insufficient documentation

## 2016-04-23 DIAGNOSIS — K2930 Chronic superficial gastritis without bleeding: Secondary | ICD-10-CM | POA: Diagnosis not present

## 2016-04-23 DIAGNOSIS — D509 Iron deficiency anemia, unspecified: Secondary | ICD-10-CM

## 2016-04-23 DIAGNOSIS — I10 Essential (primary) hypertension: Secondary | ICD-10-CM | POA: Insufficient documentation

## 2016-04-23 DIAGNOSIS — Z8261 Family history of arthritis: Secondary | ICD-10-CM | POA: Insufficient documentation

## 2016-04-23 DIAGNOSIS — Z8249 Family history of ischemic heart disease and other diseases of the circulatory system: Secondary | ICD-10-CM | POA: Diagnosis not present

## 2016-04-23 DIAGNOSIS — Z881 Allergy status to other antibiotic agents status: Secondary | ICD-10-CM | POA: Insufficient documentation

## 2016-04-23 DIAGNOSIS — Z809 Family history of malignant neoplasm, unspecified: Secondary | ICD-10-CM | POA: Diagnosis not present

## 2016-04-23 DIAGNOSIS — K2980 Duodenitis without bleeding: Secondary | ICD-10-CM | POA: Diagnosis not present

## 2016-04-23 DIAGNOSIS — M1990 Unspecified osteoarthritis, unspecified site: Secondary | ICD-10-CM | POA: Insufficient documentation

## 2016-04-23 DIAGNOSIS — R220 Localized swelling, mass and lump, head: Secondary | ICD-10-CM

## 2016-04-23 DIAGNOSIS — Z7982 Long term (current) use of aspirin: Secondary | ICD-10-CM | POA: Insufficient documentation

## 2016-04-23 DIAGNOSIS — D500 Iron deficiency anemia secondary to blood loss (chronic): Secondary | ICD-10-CM

## 2016-04-23 DIAGNOSIS — Z825 Family history of asthma and other chronic lower respiratory diseases: Secondary | ICD-10-CM | POA: Diagnosis not present

## 2016-04-23 HISTORY — PX: ESOPHAGOGASTRODUODENOSCOPY (EGD) WITH PROPOFOL: SHX5813

## 2016-04-23 SURGERY — ESOPHAGOGASTRODUODENOSCOPY (EGD) WITH PROPOFOL
Anesthesia: Monitor Anesthesia Care | Site: Throat | Wound class: Clean Contaminated

## 2016-04-23 MED ORDER — OXYCODONE HCL 5 MG/5ML PO SOLN: 5.0000 mg | Freq: Once | ORAL | Status: DC | PRN

## 2016-04-23 MED ORDER — PROPOFOL 10 MG/ML IV BOLUS
INTRAVENOUS | Status: DC | PRN
  Administered 2016-04-23 (×2): 20 mg via INTRAVENOUS
  Administered 2016-04-23: 50 mg via INTRAVENOUS
  Administered 2016-04-23 (×2): 20 mg via INTRAVENOUS
  Administered 2016-04-23: 50 mg via INTRAVENOUS
  Administered 2016-04-23: 20 mg via INTRAVENOUS

## 2016-04-23 MED ORDER — OXYCODONE HCL 5 MG PO TABS: 5.0000 mg | ORAL_TABLET | Freq: Once | ORAL | Status: DC | PRN

## 2016-04-23 MED ORDER — GLYCOPYRROLATE 0.2 MG/ML IJ SOLN
INTRAMUSCULAR | Status: DC | PRN
  Administered 2016-04-23: 0.2 mg via INTRAVENOUS

## 2016-04-23 MED ORDER — LACTATED RINGERS IV SOLN
INTRAVENOUS | Status: DC
  Administered 2016-04-23: 07:00:00 via INTRAVENOUS

## 2016-04-23 MED ORDER — LIDOCAINE HCL (CARDIAC) 20 MG/ML IV SOLN
INTRAVENOUS | Status: DC | PRN
  Administered 2016-04-23: 30 mg via INTRAVENOUS

## 2016-04-23 SURGICAL SUPPLY — 32 items
BALLN DILATOR 10-12 8 (BALLOONS)
BALLN DILATOR 12-15 8 (BALLOONS)
BALLN DILATOR 15-18 8 (BALLOONS)
BALLN DILATOR CRE 0-12 8 (BALLOONS)
BALLN DILATOR ESOPH 8 10 CRE (MISCELLANEOUS) IMPLANT
BALLOON DILATOR 12-15 8 (BALLOONS) IMPLANT
BALLOON DILATOR 15-18 8 (BALLOONS) IMPLANT
BALLOON DILATOR CRE 0-12 8 (BALLOONS) IMPLANT
BLOCK BITE 60FR ADLT L/F GRN (MISCELLANEOUS) ×2 IMPLANT
CANISTER SUCT 1200ML W/VALVE (MISCELLANEOUS) ×2 IMPLANT
CLIP HMST 235XBRD CATH ROT (MISCELLANEOUS) IMPLANT
CLIP RESOLUTION 360 11X235 (MISCELLANEOUS)
FCP ESCP3.2XJMB 240X2.8X (MISCELLANEOUS) ×1
FORCEPS BIOP RAD 4 LRG CAP 4 (CUTTING FORCEPS) IMPLANT
FORCEPS BIOP RJ4 240 W/NDL (MISCELLANEOUS) ×1
FORCEPS ESCP3.2XJMB 240X2.8X (MISCELLANEOUS) ×1 IMPLANT
GOWN CVR UNV OPN BCK APRN NK (MISCELLANEOUS) ×2 IMPLANT
GOWN ISOL THUMB LOOP REG UNIV (MISCELLANEOUS) ×2
INJECTOR VARIJECT VIN23 (MISCELLANEOUS) IMPLANT
KIT DEFENDO VALVE AND CONN (KITS) IMPLANT
KIT ENDO PROCEDURE OLY (KITS) ×2 IMPLANT
MARKER SPOT ENDO TATTOO 5ML (MISCELLANEOUS) IMPLANT
PAD GROUND ADULT SPLIT (MISCELLANEOUS) IMPLANT
RETRIEVER NET PLAT FOOD (MISCELLANEOUS) IMPLANT
SNARE SHORT THROW 13M SML OVAL (MISCELLANEOUS) IMPLANT
SNARE SHORT THROW 30M LRG OVAL (MISCELLANEOUS) IMPLANT
SPOT EX ENDOSCOPIC TATTOO (MISCELLANEOUS)
SYR INFLATION 60ML (SYRINGE) IMPLANT
TRAP ETRAP POLY (MISCELLANEOUS) IMPLANT
VARIJECT INJECTOR VIN23 (MISCELLANEOUS)
WATER STERILE IRR 250ML POUR (IV SOLUTION) ×2 IMPLANT
WIRE CRE 18-20MM 8CM F G (MISCELLANEOUS) IMPLANT

## 2016-04-23 NOTE — Anesthesia Preprocedure Evaluation (Addendum)
Anesthesia Evaluation  Patient identified by MRN, date of birth, ID band Patient awake    Reviewed: Allergy & Precautions, H&P , NPO status , Patient's Chart, lab work & pertinent test results  History of Anesthesia Complications Negative for: history of anesthetic complications  Airway Mallampati: II  TM Distance: >3 FB Neck ROM: full    Dental  (+) Poor Dentition, Chipped,    Pulmonary neg pulmonary ROS, neg shortness of breath,    Pulmonary exam normal breath sounds clear to auscultation       Cardiovascular Exercise Tolerance: Good hypertension, (-) angina(-) DOE Normal cardiovascular exam+ dysrhythmias (S/P ablation of atrial fibrillation ) Atrial Fibrillation  Rhythm:regular Rate:Normal     Neuro/Psych CVA (2012), No Residual Symptoms negative psych ROS   GI/Hepatic negative GI ROS, Neg liver ROS,   Endo/Other  negative endocrine ROS  Renal/GU negative Renal ROS  negative genitourinary   Musculoskeletal  (+) Arthritis ,   Abdominal   Peds  Hematology  (+) anemia ,   Anesthesia Other Findings ekg: 7/18: Sinus rhythm with marked sinus arrhythmia Left axis deviation Left ventricular hypertrophy with QRS widening;  stress: 03/2016: Negative ETT.  LV function normal.  No reversible ischemia.;  cards cleared: 7/18: dr. Ubaldo Glassing;  Reproductive/Obstetrics negative OB ROS                           Anesthesia Physical Anesthesia Plan  ASA: II  Anesthesia Plan: MAC   Post-op Pain Management:    Induction:   Airway Management Planned:   Additional Equipment:   Intra-op Plan:   Post-operative Plan:   Informed Consent:   Plan Discussed with:   Anesthesia Plan Comments:         Anesthesia Quick Evaluation

## 2016-04-23 NOTE — Op Note (Signed)
[facility] Gastroenterology Patient Name: Kristina Henson Procedure Date: 04/23/2016 8:08 AM MRN: [id number] Account #: [id number] Date of Birth: 10-May-1956 Admit Type: Outpatient Age: 60 Room: [facility] OR ROOM 01 Gender: Female Note Status: Finalized Procedure:            Upper GI endoscopy Indications:          Iron deficiency anemia Providers:            Lucilla Lame MD, MD Referring MD:         Valerie Roys (Referring MD) Medicines:            Propofol per Anesthesia Complications:        No immediate complications. Procedure:            Pre-Anesthesia Assessment:                       - Prior to the procedure, a History and Physical was                        performed, and patient medications and allergies were                        reviewed. The patient's tolerance of previous                        anesthesia was also reviewed. The risks and benefits of                        the procedure and the sedation options and risks were                        discussed with the patient. All questions were                        answered, and informed consent was obtained. Prior                        Anticoagulants: The patient has taken no previous                        anticoagulant or antiplatelet agents. ASA Grade                        Assessment: II - A patient with mild systemic disease.                        After reviewing the risks and benefits, the patient was                        deemed in satisfactory condition to undergo the                        procedure.                       After obtaining informed consent, the endoscope was                        passed under direct vision. Throughout the procedure,  the patient's blood pressure, pulse, and oxygen                        saturations were monitored continuously. The Olympus                        GIF H180J endoscope (S#: [id number]) was introduced   through the mouth, and advanced to the second part of                        duodenum. The upper GI endoscopy was accomplished                        without difficulty. The patient tolerated the procedure                        well. Findings:      The examined esophagus was normal.      Localized mild inflammation characterized by erythema was found in the       gastric antrum. Biopsies were taken with a cold forceps for histology.      Localized mild inflammation characterized by erythema was found in the       duodenal bulb. Biopsies were taken with a cold forceps for histology. Impression:           - Normal esophagus.                       - Gastritis. Biopsied.                       - Duodenitis. Biopsied. Recommendation:       - Await pathology results. Procedure Code(s):    --- Professional ---                       [phone], Esophagogastroduodenoscopy, flexible, transoral;                        with biopsy, single or multiple Diagnosis Code(s):    --- Professional ---                       D50.9, Iron deficiency anemia, unspecified                       K29.70, Gastritis, unspecified, without bleeding                       K29.80, Duodenitis without bleeding CPT copyright 2016 American Medical Association. All rights reserved. The codes documented in this report are preliminary and upon coder review may  be revised to meet current compliance requirements. Lucilla Lame MD, MD 04/23/2016 8:17:15 AM This report has been signed electronically. Number of Addenda: 0 Note Initiated On: 04/23/2016 8:08 AM      [facility]

## 2016-04-23 NOTE — Anesthesia Postprocedure Evaluation (Signed)
Anesthesia Post Note  Patient: Kristina Henson  Procedure(s) Performed: Procedure(s) (LRB): ESOPHAGOGASTRODUODENOSCOPY (EGD) WITH PROPOFOL (N/A)  Patient location during evaluation: PACU Anesthesia Type: MAC Level of consciousness: awake and alert Pain management: pain level controlled Vital Signs Assessment: post-procedure vital signs reviewed and stable Respiratory status: spontaneous breathing, nonlabored ventilation, respiratory function stable and patient connected to nasal cannula oxygen Cardiovascular status: stable and blood pressure returned to baseline Anesthetic complications: no    Aniya Jolicoeur

## 2016-04-23 NOTE — Transfer of Care (Signed)
Immediate Anesthesia Transfer of Care Note  Patient: Kristina Henson  Procedure(s) Performed: Procedure(s) with comments: ESOPHAGOGASTRODUODENOSCOPY (EGD) WITH PROPOFOL (N/A) - small bowel bx gastric antrum bx  Patient Location: PACU  Anesthesia Type: MAC  Level of Consciousness: awake, alert  and patient cooperative  Airway and Oxygen Therapy: Patient Spontanous Breathing and Patient connected to supplemental oxygen  Post-op Assessment: Post-op Vital signs reviewed, Patient's Cardiovascular Status Stable, Respiratory Function Stable, Patent Airway and No signs of Nausea or vomiting  Post-op Vital Signs: Reviewed and stable  Complications: No apparent anesthesia complications

## 2016-04-23 NOTE — H&P (Signed)
Lucilla Lame, MD [facility] [address] [place], [place] [address] Phone: [phone] Fax : [phone]  Primary Care Physician:  Park Liter, DO Primary Gastroenterologist:  Dr. Allen Norris  Pre-Procedure History & Physical: HPI:  Kristina Henson is a 60 y.o. female is here for an endoscopy.   Past Medical History:  Diagnosis Date  . Anemia   . Arthritis    BACK-LUMBAR  . Atrial fibrillation ([place])   . Dysrhythmia   . Hypertension   . Pneumonia 2016  . Stroke [facility]) 2012    Past Surgical History:  Procedure Laterality Date  . ABDOMINAL HYSTERECTOMY     Total  . cardiac catherization    . COLONOSCOPY WITH PROPOFOL N/A 11/18/2015   Procedure: COLONOSCOPY WITH PROPOFOL;  Surgeon: Lucilla Lame, MD;  Location: [facility] ENDOSCOPY;  Service: Endoscopy;  Laterality: N/A;  . CORONARY ANGIOPLASTY      Prior to Admission medications   Medication Sig Start Date End Date Taking? Authorizing Provider  amiodarone (PACERONE) 200 MG tablet Take 200 mg by mouth every morning.    Yes Historical Provider, MD  aspirin EC 81 MG tablet Take 81 mg by mouth daily.    Yes Historical Provider, MD  CRANBERRY PO Take 1 tablet by mouth daily.   Yes Historical Provider, MD  ferrous sulfate 325 (65 FE) MG EC tablet Take 1 tablet (325 mg total) by mouth 3 (three) times daily with meals. 04/02/16  Yes Megan P Johnson, DO  hydrALAZINE (APRESOLINE) 50 MG tablet Take 50 mg by mouth 2 (two) times daily.   Yes Historical Provider, MD  lisinopril-hydrochlorothiazide (PRINZIDE,ZESTORETIC) 20-25 MG tablet Take 2 tablets by mouth daily. Patient taking differently: Take 2 tablets by mouth every morning.  01/20/16  Yes Megan P Johnson, DO  Multiple Vitamin (MULTIVITAMIN) tablet Take 1 tablet by mouth daily. Reported on 04/01/2016   Yes Historical Provider, MD    Allergies as of 04/21/2016 - Review Complete 04/21/2016  Allergen Reaction Noted  . Amoxicillin Other (See Comments) 01/30/2016    Family History  Problem  Relation Age of Onset  . Arthritis Mother   . Cancer Mother   . Hypertension Sister   . Asthma Brother   . Breast cancer Neg Hx     Social History   Social History  . Marital status: Divorced    Spouse name: N/A  . Number of children: N/A  . Years of education: N/A   Occupational History  . Not on file.   Social History Main Topics  . Smoking status: Never Smoker  . Smokeless tobacco: Never Used  . Alcohol use No  . Drug use: No  . Sexual activity: Yes   Other Topics Concern  . Not on file   Social History Narrative  . No narrative on file    Review of Systems: See HPI, otherwise negative ROS  Physical Exam: BP (!) 163/79   Pulse 64   Temp 98.4 F (36.9 C) (Temporal)   Ht 5\' 2"  (1.575 m)   Wt 171 lb (77.6 kg)   LMP  (Exact Date)   SpO2 99%   BMI 31.28 kg/m  General:   Alert,  pleasant and cooperative in NAD Head:  Normocephalic and atraumatic. Neck:  Supple; no masses or thyromegaly. Lungs:  Clear throughout to auscultation.    Heart:  Regular rate and rhythm. Abdomen:  Soft, nontender and nondistended. Normal bowel sounds, without guarding, and without rebound.   Neurologic:  Alert and  oriented x4;  grossly normal neurologically.  Impression/Plan: Kristina Henson is here for an endoscopy to be performed for anemia  Risks, benefits, limitations, and alternatives regarding  endoscopy have been reviewed with the patient.  Questions have been answered.  All parties agreeable.   Lucilla Lame, MD  04/23/2016, 7:34 AM

## 2016-04-23 NOTE — Anesthesia Procedure Notes (Signed)
Procedure Name: MAC Performed by: Zora Glendenning Pre-anesthesia Checklist: Patient identified, Emergency Drugs available, Suction available, Timeout performed and Patient being monitored Patient Re-evaluated:Patient Re-evaluated prior to inductionOxygen Delivery Method: Nasal cannula Placement Confirmation: positive ETCO2     

## 2016-04-25 LAB — GLIA (IGA/G) + TTG IGA
ANTIGLIADIN ABS, IGA: 2 U (ref 0–19)
Gliadin IgG: 2 units (ref 0–19)
Transglutaminase IgA: <2 U/mL (ref 0–3)

## 2016-04-26 ENCOUNTER — Encounter: Payer: Self-pay | Admitting: Gastroenterology

## 2016-04-27 ENCOUNTER — Other Ambulatory Visit: Payer: Self-pay | Admitting: General Surgery

## 2016-04-27 ENCOUNTER — Encounter: Payer: Self-pay | Admitting: Gastroenterology

## 2016-04-28 ENCOUNTER — Other Ambulatory Visit: Payer: Self-pay | Admitting: Radiology

## 2016-04-28 ENCOUNTER — Encounter: Payer: Self-pay | Admitting: Gastroenterology

## 2016-04-29 ENCOUNTER — Other Ambulatory Visit: Payer: Self-pay | Admitting: Diagnostic Radiology

## 2016-04-29 ENCOUNTER — Ambulatory Visit
Admission: RE | Admit: 2016-04-29 | Discharge: 2016-04-29 | Disposition: A | Discharge status: Home / Self Care | Payer: Medicare Other | Source: Ambulatory Visit | Attending: Otolaryngology | Admitting: Otolaryngology

## 2016-04-29 DIAGNOSIS — Z880 Allergy status to penicillin: Secondary | ICD-10-CM | POA: Insufficient documentation

## 2016-04-29 DIAGNOSIS — D37032 Neoplasm of uncertain behavior of the submandibular salivary glands: Secondary | ICD-10-CM | POA: Diagnosis present

## 2016-04-29 DIAGNOSIS — K118 Other diseases of salivary glands: Secondary | ICD-10-CM | POA: Diagnosis not present

## 2016-04-29 DIAGNOSIS — D649 Anemia, unspecified: Secondary | ICD-10-CM | POA: Insufficient documentation

## 2016-04-29 DIAGNOSIS — Z8673 Personal history of transient ischemic attack (TIA), and cerebral infarction without residual deficits: Secondary | ICD-10-CM | POA: Diagnosis not present

## 2016-04-29 DIAGNOSIS — Z79899 Other long term (current) drug therapy: Secondary | ICD-10-CM | POA: Diagnosis not present

## 2016-04-29 DIAGNOSIS — R220 Localized swelling, mass and lump, head: Secondary | ICD-10-CM

## 2016-04-29 DIAGNOSIS — I4891 Unspecified atrial fibrillation: Secondary | ICD-10-CM | POA: Insufficient documentation

## 2016-04-29 DIAGNOSIS — Z7982 Long term (current) use of aspirin: Secondary | ICD-10-CM | POA: Insufficient documentation

## 2016-04-29 DIAGNOSIS — K2980 Duodenitis without bleeding: Secondary | ICD-10-CM | POA: Insufficient documentation

## 2016-04-29 DIAGNOSIS — Z9889 Other specified postprocedural states: Secondary | ICD-10-CM | POA: Insufficient documentation

## 2016-04-29 DIAGNOSIS — D117 Benign neoplasm of other major salivary glands: Secondary | ICD-10-CM | POA: Diagnosis not present

## 2016-04-29 DIAGNOSIS — M1990 Unspecified osteoarthritis, unspecified site: Secondary | ICD-10-CM | POA: Diagnosis not present

## 2016-04-29 DIAGNOSIS — I10 Essential (primary) hypertension: Secondary | ICD-10-CM | POA: Diagnosis not present

## 2016-04-29 HISTORY — DX: Localized swelling, mass and lump, head: R22.0

## 2016-04-29 HISTORY — PX: BIOPSY SALIVARY GLAND: PRO30

## 2016-04-29 HISTORY — DX: Localized swelling, mass and lump, neck: R22.1

## 2016-04-29 MED ORDER — MIDAZOLAM HCL 5 MG/5ML IJ SOLN
INTRAMUSCULAR | Status: AC
  Filled 2016-04-29: qty 5

## 2016-04-29 MED ORDER — FENTANYL CITRATE (PF) 100 MCG/2ML IJ SOLN
INTRAMUSCULAR | Status: AC
  Filled 2016-04-29: qty 2

## 2016-04-29 MED ORDER — SODIUM CHLORIDE 0.9 % IV SOLN
INTRAVENOUS | Status: DC
  Administered 2016-04-29: 11:00:00 via INTRAVENOUS

## 2016-04-29 MED ORDER — FENTANYL CITRATE (PF) 100 MCG/2ML IJ SOLN
INTRAMUSCULAR | Status: AC | PRN
  Administered 2016-04-29: 25 ug via INTRAVENOUS

## 2016-04-29 NOTE — Procedures (Signed)
US guided FNA of left submandibular gland lesion.  4 FNAs obtained.  No immediate complication.

## 2016-04-29 NOTE — Consult Note (Signed)
Chief Complaint: Patient was seen in consultation today for left submandibular lesion biopsy at the request of [place]  Referring Physician(s): Vaught,Creighton  Patient Status: Outpatient  History of Present Illness: Kristina Henson is a 60 y.o. female with a palpable lesion in left submandibular region.  [place] and CT have confirmed a lesion in the left submandibular gland and patient needs a tissue diagnosis.  Patient has no complaints today.  History of stroke and atrial fibrillation.   Recent endoscopy for anemia demonstrated gastritis and duodenitis.   Past Medical History:  Diagnosis Date  . Anemia   . Arthritis    BACK-LUMBAR  . Atrial fibrillation ([place])   . Dysrhythmia   . Hypertension   . Mass in neck    lt side  . Pneumonia 2016  . Stroke [facility]) 2012    Past Surgical History:  Procedure Laterality Date  . ABDOMINAL HYSTERECTOMY     Total  . cardiac catherization    . COLONOSCOPY WITH PROPOFOL N/A 11/18/2015   Procedure: COLONOSCOPY WITH PROPOFOL;  Surgeon: Lucilla Lame, MD;  Location: [facility] ENDOSCOPY;  Service: Endoscopy;  Laterality: N/A;  . CORONARY ANGIOPLASTY    . ESOPHAGOGASTRODUODENOSCOPY (EGD) WITH PROPOFOL N/A 04/23/2016   Procedure: ESOPHAGOGASTRODUODENOSCOPY (EGD) WITH PROPOFOL;  Surgeon: Lucilla Lame, MD;  Location: [place];  Service: Endoscopy;  Laterality: N/A;  small bowel bx gastric antrum bx    Allergies: Amoxicillin  Medications: Prior to Admission medications   Medication Sig Start Date End Date Taking? Authorizing Provider  amiodarone (PACERONE) 200 MG tablet Take 200 mg by mouth every morning.    Yes Historical Provider, MD  aspirin EC 81 MG tablet Take 81 mg by mouth daily.    Yes Historical Provider, MD  CRANBERRY PO Take 1 tablet by mouth daily.   Yes Historical Provider, MD  ferrous sulfate 325 (65 FE) MG EC tablet Take 1 tablet (325 mg total) by mouth 3 (three) times daily with meals. 04/02/16  Yes Megan P Johnson,  DO  hydrALAZINE (APRESOLINE) 50 MG tablet Take 50 mg by mouth 2 (two) times daily.   Yes Historical Provider, MD  lisinopril-hydrochlorothiazide (PRINZIDE,ZESTORETIC) 20-25 MG tablet Take 2 tablets by mouth daily. Patient taking differently: Take 2 tablets by mouth every morning.  01/20/16  Yes Megan P Johnson, DO  Multiple Vitamin (MULTIVITAMIN) tablet Take 1 tablet by mouth daily. Reported on 04/01/2016   Yes Historical Provider, MD     Family History  Problem Relation Age of Onset  . Arthritis Mother   . Cancer Mother   . Hypertension Sister   . Asthma Brother   . Breast cancer Neg Hx     Social History   Social History  . Marital status: Divorced    Spouse name: N/A  . Number of children: N/A  . Years of education: N/A   Social History Main Topics  . Smoking status: Never Smoker  . Smokeless tobacco: Never Used  . Alcohol use No  . Drug use: No  . Sexual activity: Yes   Other Topics Concern  . None   Social History Narrative  . None     Review of Systems  Constitutional: Negative for chills and fatigue.  Respiratory: Negative.   Cardiovascular: Negative.   Gastrointestinal: Negative.   Genitourinary: Negative.     Vital Signs: BP (!) 149/59   Pulse (!) 56   Temp 98.3 F (36.8 C)   Resp 18   Ht 5\' 2"  (1.575 m)  Wt 170 lb (77.1 kg)   LMP  (Exact Date)   SpO2 100%   BMI 31.09 kg/m   Physical Exam  Cardiovascular: Normal rate, regular rhythm and normal heart sounds.   Pulmonary/Chest: Effort normal and breath sounds normal.  Abdominal: Soft. Bowel sounds are normal. She exhibits no distension. There is no tenderness.  Visible and palpable lesion in left submandibular region.  Mallampati Score: 2     Imaging: No results found.  Labs:  CBC:  Recent Labs  04/01/16 1107 04/16/16 1050  WBC 4.7 4.4  HCT 32.1* 29.7*  PLT 179 182    COAGS: No results for input(s): INR, APTT in the last 8760 hours.  BMP:  Recent Labs  10/21/15 1056  03/10/16 0932 04/07/16 1103  NA 141 139 139  K 3.8 3.8 3.8  CL 101 98 99  CO2 25 23 24   GLUCOSE 96 106* 95  BUN 23 22 27*  CALCIUM 9.5 9.3 9.0  CREATININE 0.75 0.89 0.79  GFRNONAA 89 72 83  GFRAA 102 83 95    LIVER FUNCTION TESTS:  Recent Labs  10/21/15 1056 03/10/16 0932  BILITOT 0.3 0.3  AST 15 23  ALT 14 21  ALKPHOS 86 77  PROT 6.6 6.8  ALBUMIN 4.0 3.7    TUMOR MARKERS: No results for input(s): AFPTM, CEA, CA199, CHROMGRNA in the last 8760 hours.  Assessment and Plan:  60 yo with a left submandibular lesion and needs biopsy.  Discussed US guided biopsy with patient and informed consent was obtained.  Will use moderate sedation if necessary.    Thank you for this interesting consult.  I greatly enjoyed meeting TRIMEKA GANDER and look forward to participating in their care.  A copy of this report was sent to the requesting provider on this date.  Electronically Signed: Carylon Perches 04/29/2016, 11:06 AM   I spent a total of  15 Minutes   in face to face in clinical consultation, greater than 50% of which was counseling/coordinating care for left neck biopsy.

## 2016-04-30 ENCOUNTER — Encounter: Payer: Self-pay | Admitting: Internal Medicine

## 2016-04-30 ENCOUNTER — Inpatient Hospital Stay: Payer: Medicare Other

## 2016-04-30 ENCOUNTER — Inpatient Hospital Stay: Payer: Medicare Other | Attending: Oncology | Admitting: Internal Medicine

## 2016-04-30 VITALS — BP 116/72 | HR 56 | Temp 99.8°F | Resp 18 | Wt 170.1 lb

## 2016-04-30 DIAGNOSIS — Z8701 Personal history of pneumonia (recurrent): Secondary | ICD-10-CM | POA: Diagnosis not present

## 2016-04-30 DIAGNOSIS — D638 Anemia in other chronic diseases classified elsewhere: Secondary | ICD-10-CM | POA: Insufficient documentation

## 2016-04-30 DIAGNOSIS — R0781 Pleurodynia: Secondary | ICD-10-CM | POA: Diagnosis not present

## 2016-04-30 DIAGNOSIS — Z7982 Long term (current) use of aspirin: Secondary | ICD-10-CM | POA: Insufficient documentation

## 2016-04-30 DIAGNOSIS — I4891 Unspecified atrial fibrillation: Secondary | ICD-10-CM

## 2016-04-30 DIAGNOSIS — D649 Anemia, unspecified: Secondary | ICD-10-CM | POA: Diagnosis not present

## 2016-04-30 DIAGNOSIS — R079 Chest pain, unspecified: Secondary | ICD-10-CM | POA: Insufficient documentation

## 2016-04-30 DIAGNOSIS — Z8673 Personal history of transient ischemic attack (TIA), and cerebral infarction without residual deficits: Secondary | ICD-10-CM

## 2016-04-30 DIAGNOSIS — R0602 Shortness of breath: Secondary | ICD-10-CM | POA: Insufficient documentation

## 2016-04-30 DIAGNOSIS — M79601 Pain in right arm: Secondary | ICD-10-CM | POA: Diagnosis not present

## 2016-04-30 DIAGNOSIS — I10 Essential (primary) hypertension: Secondary | ICD-10-CM

## 2016-04-30 DIAGNOSIS — R109 Unspecified abdominal pain: Secondary | ICD-10-CM | POA: Insufficient documentation

## 2016-04-30 DIAGNOSIS — K111 Hypertrophy of salivary gland: Secondary | ICD-10-CM | POA: Insufficient documentation

## 2016-04-30 DIAGNOSIS — Z79899 Other long term (current) drug therapy: Secondary | ICD-10-CM | POA: Insufficient documentation

## 2016-04-30 DIAGNOSIS — N200 Calculus of kidney: Secondary | ICD-10-CM | POA: Insufficient documentation

## 2016-04-30 DIAGNOSIS — R5383 Other fatigue: Secondary | ICD-10-CM | POA: Diagnosis not present

## 2016-04-30 DIAGNOSIS — M129 Arthropathy, unspecified: Secondary | ICD-10-CM | POA: Insufficient documentation

## 2016-04-30 LAB — COMPREHENSIVE METABOLIC PANEL
ALBUMIN: 3.4 g/dL — AB (ref 3.5–5.0)
ALK PHOS: 56 U/L (ref 38–126)
ALT: 16 U/L (ref 14–54)
ANION GAP: 8 (ref 5–15)
AST: 22 U/L (ref 15–41)
BUN: 36 mg/dL — ABNORMAL HIGH (ref 6–20)
CALCIUM: 9.3 mg/dL (ref 8.9–10.3)
CHLORIDE: 102 mmol/L (ref 101–111)
CO2: 27 mmol/L (ref 22–32)
Creatinine, Ser: 1.12 mg/dL — ABNORMAL HIGH (ref 0.44–1.00)
GFR calc Af Amer: >60 mL/min (ref >60)
GFR calc non Af Amer: 53 mL/min — ABNORMAL LOW (ref >60)
GLUCOSE: 107 mg/dL — AB (ref 65–99)
Potassium: 3.7 mmol/L (ref 3.5–5.1)
SODIUM: 137 mmol/L (ref 135–145)
Total Bilirubin: 0.8 mg/dL (ref 0.3–1.2)
Total Protein: 7.2 g/dL (ref 6.5–8.1)

## 2016-04-30 LAB — CBC WITH DIFFERENTIAL/PLATELET
Basophils Absolute: 0 10*3/uL (ref 0–0.1)
Basophils Relative: 1 %
Eosinophils Absolute: 0 10*3/uL (ref 0–0.7)
Eosinophils Relative: 0 %
HCT: 29.4 % — ABNORMAL LOW (ref 35.0–47.0)
Hemoglobin: 9.8 g/dL — ABNORMAL LOW (ref 12.0–16.0)
Lymphocytes Relative: 19 %
Lymphs Abs: 0.6 10*3/uL — ABNORMAL LOW (ref 1.0–3.6)
MCH: 27.2 pg (ref 26.0–34.0)
MCHC: 33.5 g/dL (ref 32.0–36.0)
MCV: 81.3 fL (ref 80.0–100.0)
Monocytes Absolute: 0.1 10*3/uL — ABNORMAL LOW (ref 0.2–0.9)
Monocytes Relative: 4 %
Neutro Abs: 2.2 10*3/uL (ref 1.4–6.5)
Neutrophils Relative %: 76 %
Platelets: 166 10*3/uL (ref 150–440)
RBC: 3.62 MIL/uL — ABNORMAL LOW (ref 3.80–5.20)
RDW: 17.2 % — ABNORMAL HIGH (ref 11.5–14.5)
WBC: 2.9 10*3/uL — ABNORMAL LOW (ref 3.6–11.0)

## 2016-04-30 LAB — RETICULOCYTES
RBC.: 3.62 MIL/uL — ABNORMAL LOW (ref 3.80–5.20)
Retic Count, Absolute: 57.9 10*3/uL (ref 19.0–183.0)
Retic Ct Pct: 1.6 % (ref 0.4–3.1)

## 2016-04-30 LAB — VITAMIN B12: Vitamin B-12: 342 pg/mL (ref 180–914)

## 2016-04-30 LAB — IRON AND TIBC
Iron: 14 ug/dL — ABNORMAL LOW (ref 28–170)
Saturation Ratios: 6 % — ABNORMAL LOW (ref 10.4–31.8)
TIBC: 256 ug/dL (ref 250–450)
UIBC: 242 ug/dL

## 2016-04-30 LAB — FERRITIN: Ferritin: 127 ng/mL (ref 11–307)

## 2016-04-30 LAB — LACTATE DEHYDROGENASE: LDH: 219 U/L — ABNORMAL HIGH (ref 98–192)

## 2016-04-30 NOTE — Progress Notes (Signed)
Patient here today as new evaluation regarding anemia.  (Hgb. 9.4)  Referred by Dr. Park Liter.  Patient had a stroke in 2012.  Also has A-Fib.  Complains today of pain in her right flank area 10/10.  Hurts more when lying down.

## 2016-04-30 NOTE — Progress Notes (Signed)
[facility] NOTE  Patient Care Team: Valerie Roys, DO as PCP - General (Family Medicine) Teodoro Spray, MD as Consulting Physician (Cardiology) Carloyn Manner, MD as Referring Physician (Otolaryngology)  CHIEF COMPLAINTS/PURPOSE OF CONSULTATION:   # Left neck submandibular mass [~2cm]- biopsy pending  # Anemia- EGD/ [place] [Dr.Wohl; 2017]  No history exists.     HISTORY OF PRESENTING ILLNESS:  Kristina Henson 60 y.o.  female noted to have lump in the left neck for the last few months which was not improving. A CT scan showed approximately 2 cm salivary gland enlargement She has been evaluated by Dr. Pryor Ochoa- and had a biopsy yesterday- results are pending at this time.  Patient also noted to have anemia- hemoglobin around 9; has been referred by PCP for further recommendations.  Patient complains of fatigue. Shortness of breath on exertion. She also complains of right-sided posterior chest pain. Denies any headaches. Denies any blood in stools or black stools. Patient had EGD and colonoscopy February 2017.   ROS: A complete 10 point review of system is done which is negative except mentioned above in history of present illness  MEDICAL HISTORY:  Past Medical History:  Diagnosis Date  . Anemia   . Arthritis    BACK-LUMBAR  . Atrial fibrillation ([place])   . Dysrhythmia   . Hypertension   . Mass in neck    lt side  . Pneumonia 2016  . Stroke ([place]) 2012  . Stroke [facility]) 2012    SURGICAL HISTORY: Past Surgical History:  Procedure Laterality Date  . ABDOMINAL HYSTERECTOMY     Total  . cardiac catherization    . COLONOSCOPY WITH PROPOFOL N/A 11/18/2015   Procedure: COLONOSCOPY WITH PROPOFOL;  Surgeon: Lucilla Lame, MD;  Location: [facility] ENDOSCOPY;  Service: Endoscopy;  Laterality: N/A;  . CORONARY ANGIOPLASTY    . ESOPHAGOGASTRODUODENOSCOPY (EGD) WITH PROPOFOL N/A 04/23/2016   Procedure: ESOPHAGOGASTRODUODENOSCOPY (EGD) WITH PROPOFOL;  Surgeon: Lucilla Lame, MD;  Location: [place];  Service: Endoscopy;  Laterality: N/A;  small bowel bx gastric antrum bx    SOCIAL HISTORY: Social History   Social History  . Marital status: Divorced    Spouse name: N/A  . Number of children: N/A  . Years of education: N/A   Occupational History  . Not on file.   Social History Main Topics  . Smoking status: Never Smoker  . Smokeless tobacco: Never Used  . Alcohol use No  . Drug use: No  . Sexual activity: Yes   Other Topics Concern  . Not on file   Social History Narrative  . No narrative on file    FAMILY HISTORY: Family History  Problem Relation Age of Onset  . Arthritis Mother   . Cancer Mother   . Hypertension Sister   . Asthma Brother   . Breast cancer Neg Hx     ALLERGIES:  is allergic to amoxicillin.  MEDICATIONS:  Current Outpatient Prescriptions  Medication Sig Dispense Refill  . amiodarone (PACERONE) 200 MG tablet Take 200 mg by mouth every morning.     Marland Kitchen aspirin EC 81 MG tablet Take 81 mg by mouth daily.     Marland Kitchen CRANBERRY PO Take 1 tablet by mouth daily.    . ferrous sulfate 325 (65 FE) MG EC tablet Take 1 tablet (325 mg total) by mouth 3 (three) times daily with meals. 90 tablet 1  . hydrALAZINE (APRESOLINE) 50 MG tablet Take 50 mg by mouth 2 (  two) times daily.    Marland Kitchen lisinopril-hydrochlorothiazide (PRINZIDE,ZESTORETIC) 20-25 MG tablet Take 2 tablets by mouth daily. (Patient taking differently: Take 2 tablets by mouth every morning. ) 180 tablet 1  . Multiple Vitamin (MULTIVITAMIN) tablet Take 1 tablet by mouth daily. Reported on 04/01/2016     No current facility-administered medications for this visit.       Marland Kitchen  PHYSICAL EXAMINATION: ECOG PERFORMANCE STATUS: 1 - Symptomatic but completely ambulatory  Vitals:   04/30/16 1555  BP: 116/72  Pulse: (!) 56  Resp: 18  Temp: 99.8 F (37.7 C)   Filed Weights   04/30/16 1555  Weight: 170 lb 2 oz (77.2 kg)    GENERAL: Well-nourished well-developed;  Alert, no distress and comfortable.   Accompanied by his sister. EYES: no pallor or icterus OROPHARYNX: no thrush or ulceration; good dentition  NECK: supple.  LYMPH: Approximate 2 cm submandibular mass felt  no palpable lymphadenopathy in the axillary or inguinal regions LUNGS: Decreased breath sounds at the right lower base.  No wheeze or crackles HEART/CVS: regular rate & rhythm and no murmurs; No lower extremity edema ABDOMEN: abdomen soft, non-tender and normal bowel sounds Musculoskeletal:no cyanosis of digits and no clubbing  PSYCH: alert & oriented x 3 with fluent speech NEURO: no focal motor/sensory deficits SKIN:  no rashes or significant lesions  LABORATORY DATA:  I have reviewed the data as listed Lab Results  Component Value Date   WBC 2.9 (L) 04/30/2016   HGB 9.8 (L) 04/30/2016   HCT 29.4 (L) 04/30/2016   MCV 81.3 04/30/2016   PLT 166 04/30/2016    Recent Labs  10/21/15 1056 03/10/16 0932 04/07/16 1103 04/30/16 1644  NA 141 139 139 137  K 3.8 3.8 3.8 3.7  CL 101 98 99 102  CO2 25 23 24 27   GLUCOSE 96 106* 95 107*  BUN 23 22 27* 36*  CREATININE 0.75 0.89 0.79 1.12*  CALCIUM 9.5 9.3 9.0 9.3  GFRNONAA 89 72 83 53*  GFRAA 102 83 95 >60  PROT 6.6 6.8  --  7.2  ALBUMIN 4.0 3.7  --  3.4*  AST 15 23  --  22  ALT 14 21  --  16  ALKPHOS 86 77  --  56  BILITOT 0.3 0.3  --  0.8    RADIOGRAPHIC STUDIES: I have personally reviewed the radiological images as listed and agreed with the findings in the report. US Biopsy  Result Date: 04/29/2016 INDICATION: 60 year old with a left submandibular lesion. Tissue diagnosis is needed. EXAM: ULTRASOUND GUIDED FINE-NEEDLE ASPIRATION OF SALIVARY GLAND LESION MEDICATIONS: None. ANESTHESIA/SEDATION: Fentanyl 25 mcg The patient was continuously monitored during the procedure by the interventional radiology nurse under my direct supervision. FLUOROSCOPY TIME:  None COMPLICATIONS: None immediate. PROCEDURE: The procedure was  explained to the patient. The risks and benefits of the procedure were discussed and the patient's questions were addressed. Informed consent was obtained from the patient. Patient was placed supine on the ultrasound table. The left side of the neck was evaluated with ultrasound. Heterogeneous hypoechoic nodule within the left submandibular gland was identified. Skin was prepped with chlorhexidine. A sterile field was created. Skin was anesthetized with 1% lidocaine. Using ultrasound guidance, fine-needle aspirations were obtained of this lesion with 25 gauge needles. Total of 4 fine-needle aspirations were performed. Bandage placed over the puncture site. FINDINGS: 2.0 x 1.5 x 1.5 cm heterogeneous lesion in the salivary gland. Evidence for small adjacent nodules or lymph nodes. Needle position confirmed  within the lesion. IMPRESSION: Successful ultrasound-guided fine-needle aspiration of the left submandibular lesion. Electronically Signed   By: Markus Daft M.D.   On: 04/29/2016 17:58    ASSESSMENT & PLAN:   Anemia, unspecified Anemia unclear etiology- repeat CBC CMP and LDH on studies. EGD colonoscopy February 2017 negative.  # Right-sided pleuritic chest pain- unclear etiology. Check CT of the chest with contrast. Clinically less likely PE-not tachycardic.  # Left-sided neck mass status post biopsy- awaiting pathology  # Patient will follow-up with me in approximately 7-10 days to review the results of the above workup; or sooner based upon the results.   Thank you Dr. Wynetta Emery for allowing me to participate in the care of your pleasant patient. Please do not hesitate to contact me with questions or concerns in the interim.    All questions were answered. The patient knows to call the clinic with any problems, questions or concerns.     Cammie Sickle, MD 05/02/2016 12:29 PM

## 2016-05-01 LAB — HAPTOGLOBIN: HAPTOGLOBIN: 257 mg/dL — AB (ref 34–200)

## 2016-05-02 ENCOUNTER — Emergency Department: Payer: Medicare Other

## 2016-05-02 ENCOUNTER — Encounter: Payer: Self-pay | Admitting: Emergency Medicine

## 2016-05-02 ENCOUNTER — Emergency Department
Admission: EM | Admit: 2016-05-02 | Discharge: 2016-05-02 | Disposition: A | Discharge status: Home / Self Care | Payer: Medicare Other | Attending: Emergency Medicine | Admitting: Emergency Medicine

## 2016-05-02 DIAGNOSIS — M546 Pain in thoracic spine: Secondary | ICD-10-CM | POA: Insufficient documentation

## 2016-05-02 DIAGNOSIS — R918 Other nonspecific abnormal finding of lung field: Secondary | ICD-10-CM | POA: Diagnosis not present

## 2016-05-02 DIAGNOSIS — Z7982 Long term (current) use of aspirin: Secondary | ICD-10-CM | POA: Diagnosis not present

## 2016-05-02 DIAGNOSIS — M545 Low back pain: Secondary | ICD-10-CM | POA: Diagnosis not present

## 2016-05-02 DIAGNOSIS — M549 Dorsalgia, unspecified: Secondary | ICD-10-CM | POA: Diagnosis not present

## 2016-05-02 DIAGNOSIS — R109 Unspecified abdominal pain: Secondary | ICD-10-CM

## 2016-05-02 DIAGNOSIS — R079 Chest pain, unspecified: Secondary | ICD-10-CM | POA: Diagnosis not present

## 2016-05-02 DIAGNOSIS — Z79899 Other long term (current) drug therapy: Secondary | ICD-10-CM | POA: Insufficient documentation

## 2016-05-02 DIAGNOSIS — R1011 Right upper quadrant pain: Secondary | ICD-10-CM | POA: Diagnosis not present

## 2016-05-02 DIAGNOSIS — I10 Essential (primary) hypertension: Secondary | ICD-10-CM | POA: Insufficient documentation

## 2016-05-02 DIAGNOSIS — I4891 Unspecified atrial fibrillation: Secondary | ICD-10-CM | POA: Insufficient documentation

## 2016-05-02 LAB — URINALYSIS COMPLETE WITH MICROSCOPIC (ARMC ONLY)
Bilirubin Urine: NEGATIVE
Glucose, UA: NEGATIVE mg/dL
HGB URINE DIPSTICK: NEGATIVE
Ketones, ur: NEGATIVE mg/dL
NITRITE: NEGATIVE
PROTEIN: NEGATIVE mg/dL
SPECIFIC GRAVITY, URINE: 1.013 (ref 1.005–1.030)
pH: 7 (ref 5.0–8.0)

## 2016-05-02 LAB — CBC
HEMATOCRIT: 30.4 % — AB (ref 35.0–47.0)
HEMOGLOBIN: 10.3 g/dL — AB (ref 12.0–16.0)
MCH: 27.2 pg (ref 26.0–34.0)
MCHC: 33.9 g/dL (ref 32.0–36.0)
MCV: 80.1 fL (ref 80.0–100.0)
Platelets: 182 10*3/uL (ref 150–440)
RBC: 3.79 MIL/uL — AB (ref 3.80–5.20)
RDW: 16.9 % — ABNORMAL HIGH (ref 11.5–14.5)
WBC: 4.4 10*3/uL (ref 3.6–11.0)

## 2016-05-02 LAB — COMPREHENSIVE METABOLIC PANEL
ALBUMIN: 3.4 g/dL — AB (ref 3.5–5.0)
ALT: 19 U/L (ref 14–54)
ANION GAP: 10 (ref 5–15)
AST: 22 U/L (ref 15–41)
Alkaline Phosphatase: 60 U/L (ref 38–126)
BILIRUBIN TOTAL: 0.5 mg/dL (ref 0.3–1.2)
BUN: 26 mg/dL — ABNORMAL HIGH (ref 6–20)
CO2: 25 mmol/L (ref 22–32)
Calcium: 9.8 mg/dL (ref 8.9–10.3)
Chloride: 101 mmol/L (ref 101–111)
Creatinine, Ser: 0.88 mg/dL (ref 0.44–1.00)
GFR calc Af Amer: >60 mL/min (ref >60)
GLUCOSE: 120 mg/dL — AB (ref 65–99)
POTASSIUM: 4.1 mmol/L (ref 3.5–5.1)
Sodium: 136 mmol/L (ref 135–145)
TOTAL PROTEIN: 7.1 g/dL (ref 6.5–8.1)

## 2016-05-02 LAB — LIPASE, BLOOD: Lipase: 19 U/L (ref 11–51)

## 2016-05-02 MED ORDER — ACETAMINOPHEN 325 MG PO TABS
650.0000 mg | ORAL_TABLET | Freq: Once | ORAL | Status: AC
  Administered 2016-05-02: 650 mg via ORAL

## 2016-05-02 MED ORDER — ONDANSETRON HCL 4 MG/2ML IJ SOLN
4.0000 mg | Freq: Once | INTRAMUSCULAR | Status: AC
  Administered 2016-05-02: 4 mg via INTRAVENOUS
  Filled 2016-05-02: qty 2

## 2016-05-02 MED ORDER — ACETAMINOPHEN 325 MG PO TABS
ORAL_TABLET | ORAL | Status: AC
  Administered 2016-05-02: 650 mg via ORAL
  Filled 2016-05-02: qty 2

## 2016-05-02 MED ORDER — MORPHINE SULFATE (PF) 4 MG/ML IV SOLN
4.0000 mg | Freq: Once | INTRAVENOUS | Status: AC
Start: 2016-05-02 — End: 2016-05-02
  Administered 2016-05-02: 4 mg via INTRAVENOUS
  Filled 2016-05-02: qty 1

## 2016-05-02 MED ORDER — KETOROLAC TROMETHAMINE 30 MG/ML IJ SOLN
30.0000 mg | Freq: Once | INTRAMUSCULAR | Status: AC
  Administered 2016-05-02: 30 mg via INTRAVENOUS
  Filled 2016-05-02: qty 1

## 2016-05-02 MED ORDER — SODIUM CHLORIDE 0.9 % IV BOLUS (SEPSIS)
1000.0000 mL | Freq: Once | INTRAVENOUS | Status: AC
  Administered 2016-05-02: 1000 mL via INTRAVENOUS

## 2016-05-02 MED ORDER — IOPAMIDOL (ISOVUE-300) INJECTION 61%
75.0000 mL | Freq: Once | INTRAVENOUS | Status: AC | PRN
  Administered 2016-05-02: 75 mL via INTRAVENOUS
  Filled 2016-05-02: qty 75

## 2016-05-02 NOTE — ED Provider Notes (Signed)
[facility] Emergency Department Provider Note ____________________________________________  Time seen: I have reviewed the triage vital signs and the triage nursing note.  HISTORY  Chief Complaint No chief complaint on file.   Historian Patient  HPI CHOSEN WADA is a 60 y.o. female who is here with complaint of back pain which is severe and states it is currently 10 out of 10. Location is mid thoracic to the upper lumbar area. She states that occasionally she has pain wrapping around to her right abdomen, although she denies having abdominal pain, vomiting, black or bloody stools. She states that she is recently out of the cancer center for being found to be anemic. She states that she is to have a chest CT this coming week. Upon review of the note from Dr. Rogue Bussing, the patient is scheduled for a CT of the chest with contrast.  For me she is not reporting any shortness of breath or chest pain. She occasionally has the pain that wraps around to the right upper quadrant or possibly low right chest. Skin rashes. No fevers.  She had a biopsy of a nodule on her left neck recently, results not back per the patient.    Past Medical History:  Diagnosis Date  . Anemia   . Arthritis    BACK-LUMBAR  . Atrial fibrillation ([place])   . Dysrhythmia   . Hypertension   . Mass in neck    lt side  . Pneumonia 2016  . Stroke ([place]) 2012  . Stroke [facility]) 2012    Patient Active Problem List   Diagnosis Date Noted  . Chest pain 04/30/2016  . Chest pain, pleuritic 04/30/2016  . Anemia, unspecified 04/30/2016  . Iron deficiency anemia, unspecified   . Gastritis   . Duodenitis   . Anemia 04/19/2016  . Special screening for malignant neoplasms, colon   . Benign neoplasm of sigmoid colon   . Dysuria 06/25/2015  . Hypertension   . Atrial fibrillation ([place])   . HLD (hyperlipidemia) 07/11/2014  . Cerebral vascular accident [facility]) 07/11/2014    Past Surgical History:   Procedure Laterality Date  . ABDOMINAL HYSTERECTOMY     Total  . cardiac catherization    . COLONOSCOPY WITH PROPOFOL N/A 11/18/2015   Procedure: COLONOSCOPY WITH PROPOFOL;  Surgeon: Lucilla Lame, MD;  Location: [facility] ENDOSCOPY;  Service: Endoscopy;  Laterality: N/A;  . CORONARY ANGIOPLASTY    . ESOPHAGOGASTRODUODENOSCOPY (EGD) WITH PROPOFOL N/A 04/23/2016   Procedure: ESOPHAGOGASTRODUODENOSCOPY (EGD) WITH PROPOFOL;  Surgeon: Lucilla Lame, MD;  Location: [place];  Service: Endoscopy;  Laterality: N/A;  small bowel bx gastric antrum bx    Prior to Admission medications   Medication Sig Start Date End Date Taking? Authorizing Provider  amiodarone (PACERONE) 200 MG tablet Take 200 mg by mouth every morning.     Historical Provider, MD  aspirin EC 81 MG tablet Take 81 mg by mouth daily.     Historical Provider, MD  CRANBERRY PO Take 1 tablet by mouth daily.    Historical Provider, MD  ferrous sulfate 325 (65 FE) MG EC tablet Take 1 tablet (325 mg total) by mouth 3 (three) times daily with meals. 04/02/16   Megan P Johnson, DO  hydrALAZINE (APRESOLINE) 50 MG tablet Take 50 mg by mouth 2 (two) times daily.    Historical Provider, MD  lisinopril-hydrochlorothiazide (PRINZIDE,ZESTORETIC) 20-25 MG tablet Take 2 tablets by mouth daily. Patient taking differently: Take 2 tablets by mouth every morning.  01/20/16  Megan Annia Friendly, DO  Multiple Vitamin (MULTIVITAMIN) tablet Take 1 tablet by mouth daily. Reported on 04/01/2016    Historical Provider, MD    Allergies  Allergen Reactions  . Amoxicillin Other (See Comments)    Joint pains    Family History  Problem Relation Age of Onset  . Arthritis Mother   . Cancer Mother   . Hypertension Sister   . Asthma Brother   . Breast cancer Neg Hx     Social History Social History  Substance Use Topics  . Smoking status: Never Smoker  . Smokeless tobacco: Never Used  . Alcohol use No    Review of Systems  Constitutional: Negative for  fever. Eyes: Negative for visual changes. ENT: Negative for sore throat. Cardiovascular: At times some pleuritic back pain Respiratory: Negative for shortness of breath. Gastrointestinal: Negative for  vomiting and diarrhea. Genitourinary: Negative for dysuria. Musculoskeletal: Negative for back pain. Skin: Negative for rash. Neurological: Negative for headache. 10 point Review of Systems otherwise negative ____________________________________________   PHYSICAL EXAM:  VITAL SIGNS: ED Triage Vitals [05/02/16 1523]  Enc Vitals Group     BP      Pulse      Resp      Temp      Temp src      SpO2      Weight      Height      Head Circumference      Peak Flow      Pain Score 10     Pain Loc      Pain Edu?      Excl. in [place]?      Constitutional: Alert and oriented. Well appearing and in no distress. HEENT   Head: Normocephalic and atraumatic.      Eyes: Conjunctivae are normal. PERRL. Normal extraocular movements.      Ears:         Nose: No congestion/rhinnorhea.   Mouth/Throat: Mucous membranes are moist.   Neck: No stridor. Cardiovascular/Chest: Normal rate, regular rhythm.  No murmurs, rubs, or gallops.  Nontender chest wall to AP or lateral compression. Respiratory: Normal respiratory effort without tachypnea nor retractions. Breath sounds are clear and equal bilaterally. No wheezes/rales/rhonchi. Gastrointestinal: Soft. No distention, no guarding, no rebound. Very mild discomfort in the right upper quadrant.  Genitourinary/rectal:Deferred Musculoskeletal: Some back pain of the midline T-spine and upper L-spine without step-off. No skin rashes. Nontender with normal range of motion in all extremities. No joint effusions.  No lower extremity tenderness.  No edema. Neurologic:  Normal speech and language. No gross or focal neurologic deficits are appreciated. Skin:  Skin is warm, dry and intact. No rash noted. Psychiatric: Mood and affect are normal. Speech and  behavior are normal. Patient exhibits appropriate insight and judgment.  ____________________________________________   EKG I, Lisa Roca, MD, the attending physician have personally viewed and interpreted all ECGs.  None ____________________________________________  LABS (pertinent positives/negatives)  Labs Reviewed  COMPREHENSIVE METABOLIC PANEL - Abnormal; Notable for the following:       Result Value   Glucose, Bld 120 (*)    BUN 26 (*)    Albumin 3.4 (*)    All other components within normal limits  CBC - Abnormal; Notable for the following:    RBC 3.79 (*)    Hemoglobin 10.3 (*)    HCT 30.4 (*)    RDW 16.9 (*)    All other components within normal limits  URINALYSIS COMPLETEWITH MICROSCOPIC [facility]  ONLY) - Abnormal; Notable for the following:    Color, Urine YELLOW (*)    APPearance HAZY (*)    Leukocytes, UA 2+ (*)    Bacteria, UA RARE (*)    Squamous Epithelial / LPF 6-30 (*)    All other components within normal limits  URINE CULTURE  LIPASE, BLOOD    ____________________________________________  RADIOLOGY All Xrays were viewed by me. Imaging interpreted by Radiologist.  Lumbar and thoracic spine x-rays: Negative/no acute abnormalities  Chest CT with contrast:IMPRESSION: Patchy infiltrates in the bases bilaterally.  Left thyroid nodule stable from previous exam.  Hypodensity within the liver of uncertain etiology. Further workup is recommended. This can be done on a nonemergent basis.  Ultrasound right upper quadrant:IMPRESSION: Lesion in the right hepatic lobe is consistent with a hemangioma. The liver is otherwise unremarkable.  Very small amount of gallbladder sludge. Negative for cholecystitis.  0.8 cm nonobstructing stone right kidney. __________________________________________  PROCEDURES  Procedure(s) performed: None  Critical Care performed: None  ____________________________________________   ED COURSE / ASSESSMENT AND  PLAN  Pertinent labs & imaging results that were available during my care of the patient were reviewed by me and considered in my medical decision making (see chart for details).   This patient is reporting 10 out of 10 pain in her back, uncertain etiology based simply on the history.  I'm going to start with x-rays of the back.  Reviewing her note from the cancer center, she is in for a CT scan of the chest to the patient's description of pleuritic chest pain by that note. When I ask her she's not really having chest pain or shortness of breath, but she does report some pleuritic discomfort on the right posterior back when she takes a deep breath.    The x-rays are negative for fractures.  Ultimately, I did discuss with the patient obtaining the CT scan right now given her continued/persistent symptoms. This CT scan of the chest did not show any acute abnormalities of the chest, but there is an undetermined finding in the liver. Given her pain is somewhat along the chest abdomen border in the right flank or right back, I did order a right upper quadrant ultrasound for further evaluation.  On the ultrasound the abnormality would've shown to be consistent with a hemangioma, and no other acute abnormalities or source for her pain.   Laboratory studies are reassuring, I do not have a suspicion for acute medical/surgical intra-abdominal emergency. Her urinalysis looks to me like it's likely contamination, and I will send a culture.  At this point I suspect that her pain is musculoskeletal in nature. She will follow-up with her primary care physician.  CONSULTATIONS:   None   Patient / Family / Caregiver informed of clinical course, medical decision-making process, and agree with plan.   I discussed return precautions, follow-up instructions, and discharged instructions with patient and/or family.   ___________________________________________   FINAL CLINICAL IMPRESSION(S) / ED  DIAGNOSES   Final diagnoses:  Back pain  Chest pain  Right flank pain              Note: This dictation was prepared with Dragon dictation. Any transcriptional errors that result from this process are unintentional    Lisa Roca, MD 05/02/16 2205

## 2016-05-02 NOTE — Discharge Instructions (Signed)
You were evaluated for right back pain and flank pain, and although no certain cause was found, and your exam and evaluation are reassuring in the emergency department today.  Return to the emergency department for any fever, worsening pain, skin rash, vomiting blood, black or bloody stools, dizziness or passing out, trouble breathing or chest pain, or any other symptoms concerning to you.

## 2016-05-02 NOTE — Assessment & Plan Note (Signed)
Anemia unclear etiology- repeat CBC CMP and LDH on studies. EGD colonoscopy February 2017 negative.  # Right-sided pleuritic chest pain- unclear etiology. Check CT of the chest with contrast. Clinically less likely PE-not tachycardic.  # Left-sided neck mass status post biopsy- awaiting pathology  # Patient will follow-up with me in approximately 7-10 days to review the results of the above workup; or sooner based upon the results.   Thank you Dr. Wynetta Emery for allowing me to participate in the care of your pleasant patient. Please do not hesitate to contact me with questions or concerns in the interim.

## 2016-05-02 NOTE — ED Notes (Signed)
MD at bedside. 

## 2016-05-02 NOTE — ED Triage Notes (Addendum)
C/O low back and mid back pain x 1 week.  Also c/o right abdominal pain.  Denies Dysuria.  Last BM 05/02/16.  Patient states she has a appointment at the [facility] scheduled for Thursday for a CT scan of back to "see what is going on"

## 2016-05-03 ENCOUNTER — Emergency Department
Admission: EM | Admit: 2016-05-03 | Discharge: 2016-05-03 | Disposition: A | Discharge status: Home / Self Care | Payer: Medicare Other | Attending: Emergency Medicine | Admitting: Emergency Medicine

## 2016-05-03 ENCOUNTER — Encounter: Payer: Self-pay | Admitting: Emergency Medicine

## 2016-05-03 ENCOUNTER — Emergency Department: Payer: Medicare Other

## 2016-05-03 DIAGNOSIS — I10 Essential (primary) hypertension: Secondary | ICD-10-CM | POA: Diagnosis not present

## 2016-05-03 DIAGNOSIS — Z7982 Long term (current) use of aspirin: Secondary | ICD-10-CM | POA: Diagnosis not present

## 2016-05-03 DIAGNOSIS — M79631 Pain in right forearm: Secondary | ICD-10-CM | POA: Diagnosis not present

## 2016-05-03 DIAGNOSIS — M79603 Pain in arm, unspecified: Secondary | ICD-10-CM

## 2016-05-03 DIAGNOSIS — I889 Nonspecific lymphadenitis, unspecified: Secondary | ICD-10-CM | POA: Diagnosis not present

## 2016-05-03 DIAGNOSIS — Z79899 Other long term (current) drug therapy: Secondary | ICD-10-CM | POA: Diagnosis not present

## 2016-05-03 DIAGNOSIS — M79601 Pain in right arm: Secondary | ICD-10-CM | POA: Diagnosis not present

## 2016-05-03 DIAGNOSIS — I4891 Unspecified atrial fibrillation: Secondary | ICD-10-CM | POA: Insufficient documentation

## 2016-05-03 LAB — MULTIPLE MYELOMA PANEL, SERUM
ALBUMIN/GLOB SERPL: 1 (ref 0.7–1.7)
Albumin SerPl Elph-Mcnc: 3.2 g/dL (ref 2.9–4.4)
Alpha 1: 0.4 g/dL (ref 0.0–0.4)
Alpha2 Glob SerPl Elph-Mcnc: 0.9 g/dL (ref 0.4–1.0)
B-GLOBULIN SERPL ELPH-MCNC: 0.9 g/dL (ref 0.7–1.3)
GAMMA GLOB SERPL ELPH-MCNC: 1.4 g/dL (ref 0.4–1.8)
GLOBULIN, TOTAL: 3.5 g/dL (ref 2.2–3.9)
IgA: 120 mg/dL (ref 87–352)
IgG (Immunoglobin G), Serum: 1193 mg/dL (ref 700–1600)
IgM, Serum: 521 mg/dL — ABNORMAL HIGH (ref 26–217)
Total Protein ELP: 6.7 g/dL (ref 6.0–8.5)

## 2016-05-03 LAB — CYTOLOGY - NON PAP

## 2016-05-03 LAB — KAPPA/LAMBDA LIGHT CHAINS
KAPPA, LAMDA LIGHT CHAIN RATIO: 1.06 (ref 0.26–1.65)
Kappa free light chain: 60.1 mg/L — ABNORMAL HIGH (ref 3.3–19.4)
Lambda free light chains: 56.5 mg/L — ABNORMAL HIGH (ref 5.7–26.3)

## 2016-05-03 MED ORDER — TRAMADOL HCL 50 MG PO TABS
50.0000 mg | ORAL_TABLET | Freq: Once | ORAL | Status: AC
  Administered 2016-05-03: 50 mg via ORAL
  Filled 2016-05-03: qty 1

## 2016-05-03 MED ORDER — KETOROLAC TROMETHAMINE 60 MG/2ML IM SOLN
60.0000 mg | Freq: Once | INTRAMUSCULAR | Status: AC
  Administered 2016-05-03: 60 mg via INTRAMUSCULAR
  Filled 2016-05-03: qty 2

## 2016-05-03 MED ORDER — LIDOCAINE 5 % EX PTCH
1.0000 | MEDICATED_PATCH | CUTANEOUS | Status: DC
  Administered 2016-05-03: 1 via TRANSDERMAL
  Filled 2016-05-03: qty 1

## 2016-05-03 MED ORDER — LIDOCAINE 5 % EX PTCH: 1.0000 | MEDICATED_PATCH | Freq: Two times a day (BID) | CUTANEOUS | 0 refills | Status: DC

## 2016-05-03 MED ORDER — ETODOLAC 200 MG PO CAPS: 200.0000 mg | ORAL_CAPSULE | Freq: Three times a day (TID) | ORAL | 0 refills | Status: DC

## 2016-05-03 MED ORDER — TRAMADOL HCL 50 MG PO TABS: 50.0000 mg | ORAL_TABLET | Freq: Four times a day (QID) | ORAL | 0 refills | Status: DC | PRN

## 2016-05-03 NOTE — ED Notes (Signed)
At discharge pt still c/o 10/10 right arm pain. See vs. Pt able to pick up purse and gallon zip lock bag full of belonging with right arm without any noticeable discomfort.

## 2016-05-03 NOTE — ED Provider Notes (Signed)
[facility] Emergency Department Provider Note   ____________________________________________   First MD Initiated Contact with Patient 05/03/16 [phone]     (approximate)  I have reviewed the triage vital signs and the nursing notes.   HISTORY  Chief Complaint Arm Pain    HPI Kristina Henson is a 60 y.o. female comes into the hospital today with some right forearm pain. The patient was seen here earlier with back pain and [facility] home. When she got home she said her right forearm started to hurt. She reports that while she was here she had x-rays, CTs and an ultrasound of her belly and her chest. Everything looked okay. She reports that she cannot stand the pain. She had had some Tylenol while in the hospital and took some Motrin at home but nothing helped. She reports it hurts really bad so she decided to come back to the hospital. She did not have her IV on the right side today but has a bruise from an IV site at another time.   Past Medical History:  Diagnosis Date  . Anemia   . Arthritis    BACK-LUMBAR  . Atrial fibrillation ([place])   . Dysrhythmia   . Hypertension   . Mass in neck    lt side  . Pneumonia 2016  . Stroke ([place]) 2012  . Stroke [facility]) 2012    Patient Active Problem List   Diagnosis Date Noted  . Chest pain 04/30/2016  . Chest pain, pleuritic 04/30/2016  . Anemia, unspecified 04/30/2016  . Iron deficiency anemia, unspecified   . Gastritis   . Duodenitis   . Anemia 04/19/2016  . Special screening for malignant neoplasms, colon   . Benign neoplasm of sigmoid colon   . Dysuria 06/25/2015  . Hypertension   . Atrial fibrillation ([place])   . HLD (hyperlipidemia) 07/11/2014  . Cerebral vascular accident [facility]) 07/11/2014    Past Surgical History:  Procedure Laterality Date  . ABDOMINAL HYSTERECTOMY     Total  . cardiac catherization    . COLONOSCOPY WITH PROPOFOL N/A 11/18/2015   Procedure: COLONOSCOPY WITH PROPOFOL;  Surgeon: Lucilla Lame, MD;  Location: [facility] ENDOSCOPY;  Service: Endoscopy;  Laterality: N/A;  . CORONARY ANGIOPLASTY    . ESOPHAGOGASTRODUODENOSCOPY (EGD) WITH PROPOFOL N/A 04/23/2016   Procedure: ESOPHAGOGASTRODUODENOSCOPY (EGD) WITH PROPOFOL;  Surgeon: Lucilla Lame, MD;  Location: [place];  Service: Endoscopy;  Laterality: N/A;  small bowel bx gastric antrum bx    Prior to Admission medications   Medication Sig Start Date End Date Taking? Authorizing Provider  amiodarone (PACERONE) 200 MG tablet Take 200 mg by mouth every morning.     Historical Provider, MD  aspirin EC 81 MG tablet Take 81 mg by mouth daily.     Historical Provider, MD  CRANBERRY PO Take 1 tablet by mouth daily.    Historical Provider, MD  etodolac (LODINE) 200 MG capsule Take 1 capsule (200 mg total) by mouth every 8 (eight) hours. 05/03/16   Loney Hering, MD  ferrous sulfate 325 (65 FE) MG EC tablet Take 1 tablet (325 mg total) by mouth 3 (three) times daily with meals. 04/02/16   Megan P Johnson, DO  hydrALAZINE (APRESOLINE) 50 MG tablet Take 50 mg by mouth 2 (two) times daily.    Historical Provider, MD  lidocaine (LIDODERM) 5 % Place 1 patch onto the skin every 12 (twelve) hours. Remove & Discard patch within 12 hours or as directed by MD 05/03/16  05/03/17  Loney Hering, MD  lisinopril-hydrochlorothiazide (PRINZIDE,ZESTORETIC) 20-25 MG tablet Take 2 tablets by mouth daily. Patient taking differently: Take 2 tablets by mouth every morning.  01/20/16   Megan P Johnson, DO  Multiple Vitamin (MULTIVITAMIN) tablet Take 1 tablet by mouth daily. Reported on 04/01/2016    Historical Provider, MD  traMADol (ULTRAM) 50 MG tablet Take 1 tablet (50 mg total) by mouth every 6 (six) hours as needed. 05/03/16   Loney Hering, MD    Allergies Amoxicillin  Family History  Problem Relation Age of Onset  . Arthritis Mother   . Cancer Mother   . Hypertension Sister   . Asthma Brother   . Breast cancer Neg Hx     Social  History Social History  Substance Use Topics  . Smoking status: Never Smoker  . Smokeless tobacco: Never Used  . Alcohol use No    Review of Systems Constitutional: No fever/chills Eyes: No visual changes. ENT: No sore throat. Cardiovascular: Denies chest pain. Respiratory: Denies shortness of breath. Gastrointestinal: No abdominal pain.  No nausea, no vomiting.  No diarrhea.  No constipation. Genitourinary: Negative for dysuria. Musculoskeletal: left arm pain Skin: Negative for rash. Neurological: Negative for headaches, focal weakness or numbness.  10-point ROS otherwise negative.  ____________________________________________   PHYSICAL EXAM:  VITAL SIGNS: ED Triage Vitals  Enc Vitals Group     BP 05/03/16 0311 (!) 128/58     Pulse Rate 05/03/16 0311 71     Resp 05/03/16 0311 17     Temp 05/03/16 0311 97.4 F (36.3 C)     Temp Source 05/03/16 0311 Oral     SpO2 05/03/16 0311 99 %     Weight 05/03/16 0311 170 lb (77.1 kg)     Height 05/03/16 0311 5\' 2"  (1.575 m)     Head Circumference --      Peak Flow --      Pain Score 05/03/16 0312 10     Pain Loc --      Pain Edu? --      Excl. in [place]? --     Constitutional: Alert and oriented. Well appearing and in no acute distress. Eyes: Conjunctivae are normal. PERRL. EOMI. Head: Atraumatic. Nose: No congestion/rhinnorhea. Mouth/Throat: Mucous membranes are moist.  Oropharynx non-erythematous. Cardiovascular: Normal rate, regular rhythm. Grossly normal heart sounds.  Good peripheral circulation. Respiratory: Normal respiratory effort.  No retractions. Lungs CTAB. Gastrointestinal: Soft and nontender. No distention. Positive bowel sounds Musculoskeletal: No lower extremity tenderness nor edema.  No TTP of right forearm Neurologic:  Normal speech and language.  Skin:  Skin is warm, dry and intact.  Psychiatric: Mood and affect are normal.   ____________________________________________   LABS (all labs ordered are  listed, but only abnormal results are displayed)  Labs Reviewed - No data to display ____________________________________________  EKG  none ____________________________________________  RADIOLOGY  [place] RUE ____________________________________________   PROCEDURES  Procedure(s) performed: None  Procedures  Critical Care performed: No  ____________________________________________   INITIAL IMPRESSION / ASSESSMENT AND PLAN / ED COURSE  Pertinent labs & imaging results that were available during my care of the patient were reviewed by me and considered in my medical decision making (see chart for details).  This is a 60 year old female who comes into the hospital today with some right upper quadrant pain. I will give the patient a dose of Toradol and tramadol and I will send her for an ultrasound of her forearm.  Clinical Course  [place] RUE: No evidence of deep venous thrombosis, 8 mm lymph node along the right forearm, just below the elbow, at the patient's site of symptoms. Would correlate for any evidence of infection.  The patient then received Lidoderm patch to her right upper extremity. She'll be discharged home to follow-up with her primary care physician. ____________________________________________   FINAL CLINICAL IMPRESSION(S) / ED DIAGNOSES  Final diagnoses:  Arm pain  Right arm pain  Lymphadenitis      NEW MEDICATIONS STARTED DURING THIS VISIT:  New Prescriptions   ETODOLAC (LODINE) 200 MG CAPSULE    Take 1 capsule (200 mg total) by mouth every 8 (eight) hours.   LIDOCAINE (LIDODERM) 5 %    Place 1 patch onto the skin every 12 (twelve) hours. Remove & Discard patch within 12 hours or as directed by MD   TRAMADOL (ULTRAM) 50 MG TABLET    Take 1 tablet (50 mg total) by mouth every 6 (six) hours as needed.     Note:  This document was prepared using Dragon voice recognition software and may include unintentional dictation errors.    Loney Hering,  MD 05/03/16 [phone]

## 2016-05-03 NOTE — ED Notes (Signed)
Pt returned from ultrasound. Still c/o 10/10 pain in arm. Laying on stretcher with even, unlabored respirations. Asked for head of stretcher to be raised, but is laying low on bed. Laid head of bed down and asked for patient to move herself up on bed so that she would not be bent in an uncomfortable place on her back. Patient pulled herself up by both arms, including the painful right arm and then scooted herself up in the bed, with the assistance of pushing herself up by her bilateral arms. Pt did not make any noises of discomfort or have any grimace on her face while doing so. MD notified of continued pain. Order received.

## 2016-05-03 NOTE — ED Triage Notes (Addendum)
Pt arrived via EMS from home. Pt was discharged from this ER at 2230 last night after being seen for back pain. Pt reports that as soon as she got home her RIGHT forearm started to have an aching pain. Pt able to move RIGHT arm with no difficulty. No obvious bruising or deformity. Pt states, "I read the papers that were sent home with me and they said to come back if I had any other pain."

## 2016-05-04 LAB — URINE CULTURE: Culture: >=100000 — AB

## 2016-05-06 DIAGNOSIS — D117 Benign neoplasm of other major salivary glands: Secondary | ICD-10-CM | POA: Diagnosis not present

## 2016-05-07 ENCOUNTER — Ambulatory Visit: Payer: Medicare Other

## 2016-05-11 ENCOUNTER — Ambulatory Visit (INDEPENDENT_AMBULATORY_CARE_PROVIDER_SITE_OTHER): Payer: Medicare Other | Admitting: Family Medicine

## 2016-05-11 ENCOUNTER — Encounter: Payer: Self-pay | Admitting: Family Medicine

## 2016-05-11 VITALS — BP 144/88 | HR 61 | Temp 98.2°F | Wt 167.0 lb

## 2016-05-11 DIAGNOSIS — N3000 Acute cystitis without hematuria: Secondary | ICD-10-CM

## 2016-05-11 DIAGNOSIS — M545 Low back pain, unspecified: Secondary | ICD-10-CM

## 2016-05-11 DIAGNOSIS — D509 Iron deficiency anemia, unspecified: Secondary | ICD-10-CM

## 2016-05-11 MED ORDER — CEFUROXIME AXETIL 250 MG PO TABS: 250.0000 mg | ORAL_TABLET | Freq: Two times a day (BID) | ORAL | 0 refills | Status: DC

## 2016-05-11 NOTE — Assessment & Plan Note (Signed)
Improved on last labs. To see hematology again tomorrow. Continue to monitor.

## 2016-05-11 NOTE — Progress Notes (Signed)
BP (!) 144/88 (BP Location: Left Arm, Patient Position: Sitting, Cuff Size: Normal)   Pulse 61   Temp 98.2 F (36.8 C)   Wt 167 lb (75.8 kg)   LMP  (Exact Date)   SpO2 99%   BMI 30.54 kg/m    Subjective:    Patient ID: Kristina Henson, female    DOB: Oct 03, 1955, 60 y.o.   MRN: [id number]  HPI: Kristina Henson is a 60 y.o. female  Chief Complaint  Patient presents with  . ER follow Up   ER FOLLOW UP Time since discharge: 8 and 9 days Hospital/facility: [facility] Diagnosis: R arm pain, midthoracic back pain (musculoskeletal) Procedures/tests: [place] (neg DVT arm), Labs, EKG (normal), CT chest (patchy infiltrates and liver hypodenisty), RUQ [place] (hypodensity= hemangioma, 0.8 cm non-obstructing kidney stone), UA + GBS Consultants: none New medications: lidoderm  Discharge instructions:  Follow up here Status: better   Feeling better since she has gotten out of the hospital  BACK PAIN Duration: about a week ago, better now Mechanism of injury: unknown Location: mid thoracic pain Onset: sudden Severity: 10/10 Quality: aching pain Frequency: intermittent Radiation: into her RUQ  Aggravating factors: nonthing Alleviating factors: medicine in the ER Status: better Treatments attempted: lodine and tramadol and heat  Relief with NSAIDs?: significant Nighttime pain:  no Paresthesias / decreased sensation:  no Bowel / bladder incontinence:  no Fevers:  no Dysuria / urinary frequency:  no  Relevant past medical, surgical, family and social history reviewed and updated as indicated. Interim medical history since our last visit reviewed. Allergies and medications reviewed and updated.  Review of Systems  Constitutional: Negative.   Respiratory: Negative.   Cardiovascular: Negative.   Psychiatric/Behavioral: Negative.     Per HPI unless specifically indicated above     Objective:    BP (!) 144/88 (BP Location: Left Arm, Patient Position: Sitting, Cuff Size: Normal)   Pulse  61   Temp 98.2 F (36.8 C)   Wt 167 lb (75.8 kg)   LMP  (Exact Date)   SpO2 99%   BMI 30.54 kg/m   Wt Readings from Last 3 Encounters:  05/11/16 167 lb (75.8 kg)  05/03/16 170 lb (77.1 kg)  04/30/16 170 lb 2 oz (77.2 kg)    Physical Exam  Constitutional: She is oriented to person, place, and time. She appears well-developed and well-nourished. No distress.  HENT:  Head: Normocephalic and atraumatic.  Right Ear: Hearing normal.  Left Ear: Hearing normal.  Nose: Nose normal.  Eyes: Conjunctivae and lids are normal. Right eye exhibits no discharge. Left eye exhibits no discharge. No scleral icterus.  Cardiovascular: Normal rate, regular rhythm, normal heart sounds and intact distal pulses.  Exam reveals no gallop and no friction rub.   No murmur heard. Pulmonary/Chest: Effort normal and breath sounds normal. No respiratory distress. She has no wheezes. She has no rales. She exhibits no tenderness.  Musculoskeletal: Normal range of motion.  Neurological: She is alert and oriented to person, place, and time.  Skin: Skin is warm, dry and intact. No rash noted. No erythema. No pallor.  Psychiatric: She has a normal mood and affect. Her speech is normal and behavior is normal. Judgment and thought content normal. Cognition and memory are normal.  Nursing note and vitals reviewed.  Back Exam:    Inspection:  Normal spinal curvature.  No deformity, ecchymosis, erythema, or lesions     Palpation:     Midline spinal tenderness: no  Paralumbar tenderness: no      Parathoracic tenderness: no      Buttocks tenderness: no     Range of Motion:      Flexion: Fingers to Knees     Extension:Decreased     Lateral bending:Decreased    Rotation:Decreased    Neuro Exam:Lower extremity DTRs normal & symmetric.  Strength and sensation intact.    Special Tests:      Straight leg raise:negative  Results for orders placed or performed during the hospital encounter of 05/02/16  Urine culture    Result Value Ref Range   Specimen Description URINE, RANDOM    Special Requests NONE    Culture (A)     >=100,000 COLONIES/mL GROUP B STREP(S.AGALACTIAE)ISOLATED There is no known Penicillin Resistant Beta Streptococcus in the [place] For patients that are Penicillin-allergic, Erythromycin is 85-94% susceptible, and Clindamycin is 80% susceptible.  Contact Microbiology within 7 days if sensitivity testing is  required.   Virtually 100% of S. agalactiae (Group B) strains are susceptible to Penicillin.  For Penicillin-allergic patients, Erythromycin (85-95% sensitive) and Clindamycin (80% sensitive) are drugs of choice. Contact microbiology lab to request sensitivities if  needed within 7 days. Performed at [facility]    Report Status 05/04/2016 FINAL   Lipase, blood  Result Value Ref Range   Lipase 19 11 - 51 U/L  Comprehensive metabolic panel  Result Value Ref Range   Sodium 136 135 - 145 mmol/L   Potassium 4.1 3.5 - 5.1 mmol/L   Chloride 101 101 - 111 mmol/L   CO2 25 22 - 32 mmol/L   Glucose, Bld 120 (H) 65 - 99 mg/dL   BUN 26 (H) 6 - 20 mg/dL   Creatinine, Ser 0.88 0.44 - 1.00 mg/dL   Calcium 9.8 8.9 - 10.3 mg/dL   Total Protein 7.1 6.5 - 8.1 g/dL   Albumin 3.4 (L) 3.5 - 5.0 g/dL   AST 22 15 - 41 U/L   ALT 19 14 - 54 U/L   Alkaline Phosphatase 60 38 - 126 U/L   Total Bilirubin 0.5 0.3 - 1.2 mg/dL   GFR calc non Af Amer >60 >60 mL/min   GFR calc Af Amer >60 >60 mL/min   Anion gap 10 5 - 15  CBC  Result Value Ref Range   WBC 4.4 3.6 - 11.0 K/uL   RBC 3.79 (L) 3.80 - 5.20 MIL/uL   Hemoglobin 10.3 (L) 12.0 - 16.0 g/dL   HCT 30.4 (L) 35.0 - 47.0 %   MCV 80.1 80.0 - 100.0 fL   MCH 27.2 26.0 - 34.0 pg   MCHC 33.9 32.0 - 36.0 g/dL   RDW 16.9 (H) 11.5 - 14.5 %   Platelets 182 150 - 440 K/uL  Urinalysis complete, with microscopic  Result Value Ref Range   Color, Urine YELLOW (A) YELLOW   APPearance HAZY (A) CLEAR   Glucose, UA NEGATIVE NEGATIVE mg/dL   Bilirubin  Urine NEGATIVE NEGATIVE   Ketones, ur NEGATIVE NEGATIVE mg/dL   Specific Gravity, Urine 1.013 1.005 - 1.030   Hgb urine dipstick NEGATIVE NEGATIVE   pH 7.0 5.0 - 8.0   Protein, ur NEGATIVE NEGATIVE mg/dL   Nitrite NEGATIVE NEGATIVE   Leukocytes, UA 2+ (A) NEGATIVE   RBC / HPF 0-5 0 - 5 RBC/hpf   WBC, UA 6-30 0 - 5 WBC/hpf   Bacteria, UA RARE (A) NONE SEEN   Squamous Epithelial / LPF 6-30 (A) NONE SEEN   Mucous PRESENT  Assessment & Plan:   Problem List Items Addressed This Visit      Other   Iron deficiency anemia, unspecified    Improved on last labs. To see hematology again tomorrow. Continue to monitor.        Other Visit Diagnoses    Acute cystitis without hematuria    -  Primary   Will treat with ceftin. Rx sent to her pharmacy.   Right-sided low back pain without sciatica       Will get her started with exercises. Continue medication. If not better in 2 weeks, get into PT.        Follow up plan: Return in about 2 weeks (around 05/25/2016) for follow up only back.

## 2016-05-11 NOTE — Patient Instructions (Addendum)

## 2016-05-12 ENCOUNTER — Ambulatory Visit: Payer: Medicare Other | Admitting: Internal Medicine

## 2016-05-12 ENCOUNTER — Encounter (INDEPENDENT_AMBULATORY_CARE_PROVIDER_SITE_OTHER): Payer: Self-pay

## 2016-05-12 ENCOUNTER — Inpatient Hospital Stay (HOSPITAL_BASED_OUTPATIENT_CLINIC_OR_DEPARTMENT_OTHER): Payer: Medicare Other | Admitting: Internal Medicine

## 2016-05-12 VITALS — BP 137/77 | HR 54 | Temp 99.1°F | Resp 18 | Wt 167.0 lb

## 2016-05-12 DIAGNOSIS — Z8673 Personal history of transient ischemic attack (TIA), and cerebral infarction without residual deficits: Secondary | ICD-10-CM

## 2016-05-12 DIAGNOSIS — K111 Hypertrophy of salivary gland: Secondary | ICD-10-CM | POA: Diagnosis not present

## 2016-05-12 DIAGNOSIS — M79601 Pain in right arm: Secondary | ICD-10-CM | POA: Diagnosis not present

## 2016-05-12 DIAGNOSIS — N200 Calculus of kidney: Secondary | ICD-10-CM | POA: Diagnosis not present

## 2016-05-12 DIAGNOSIS — Z79899 Other long term (current) drug therapy: Secondary | ICD-10-CM

## 2016-05-12 DIAGNOSIS — D649 Anemia, unspecified: Secondary | ICD-10-CM | POA: Diagnosis not present

## 2016-05-12 DIAGNOSIS — R5383 Other fatigue: Secondary | ICD-10-CM

## 2016-05-12 DIAGNOSIS — R1084 Generalized abdominal pain: Secondary | ICD-10-CM

## 2016-05-12 DIAGNOSIS — Z8701 Personal history of pneumonia (recurrent): Secondary | ICD-10-CM

## 2016-05-12 DIAGNOSIS — R109 Unspecified abdominal pain: Secondary | ICD-10-CM | POA: Diagnosis not present

## 2016-05-12 DIAGNOSIS — I10 Essential (primary) hypertension: Secondary | ICD-10-CM

## 2016-05-12 DIAGNOSIS — I4891 Unspecified atrial fibrillation: Secondary | ICD-10-CM

## 2016-05-12 DIAGNOSIS — Z7982 Long term (current) use of aspirin: Secondary | ICD-10-CM

## 2016-05-12 DIAGNOSIS — R0602 Shortness of breath: Secondary | ICD-10-CM

## 2016-05-12 DIAGNOSIS — M129 Arthropathy, unspecified: Secondary | ICD-10-CM

## 2016-05-12 DIAGNOSIS — R0781 Pleurodynia: Secondary | ICD-10-CM | POA: Diagnosis not present

## 2016-05-12 NOTE — Progress Notes (Signed)
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   # Anemia- EGD/ [place]o [Dr.Wohl; 2017] ? Anemia of chronic disease  No history exists.     HISTORY OF PRESENTING ILLNESS:  Kristina Henson 60 y.o.  female noted Noted to have anemia of unclear etiology-is here to review the workup.  She was seen in the emergency room had a CT of the chest-4 chest pain/shortness of breath-showed no acute process.    Patient complains of fatigue. Shortness of breath on exertion. Complains of intermittent abdominal pain. She does recent ultrasound of the abdomen; showed question hepatic cysts.   ROS: A complete 10 point review of system is done which is negative except mentioned above in history of present illness  MEDICAL HISTORY:  Past Medical History:  Diagnosis Date  . Anemia   . Arthritis    BACK-LUMBAR  . Atrial fibrillation ([place])   . Dysrhythmia   . Hypertension   . Mass in neck    lt side  . Pneumonia 2016  . Stroke ([place]) 2012  . Stroke [facility]) 2012    SURGICAL HISTORY: Past Surgical History:  Procedure Laterality Date  . ABDOMINAL HYSTERECTOMY     Total  . cardiac catherization    . COLONOSCOPY WITH PROPOFOL N/A 11/18/2015   Procedure: COLONOSCOPY WITH PROPOFOL;  Surgeon: Lucilla Lame, MD;  Location: [facility] ENDOSCOPY;  Service: Endoscopy;  Laterality: N/A;  . CORONARY ANGIOPLASTY    . ESOPHAGOGASTRODUODENOSCOPY (EGD) WITH PROPOFOL N/A 04/23/2016   Procedure: ESOPHAGOGASTRODUODENOSCOPY (EGD) WITH PROPOFOL;  Surgeon: Lucilla Lame, MD;  Location: [facility];  Service: Endoscopy;  Laterality: N/A;  small bowel bx gastric antrum bx    SOCIAL HISTORY: Social History    Social History  . Marital status: Divorced    Spouse name: N/A  . Number of children: N/A  . Years of education: N/A   Occupational History  . Not on file.   Social History Main Topics  . Smoking status: Never Smoker  . Smokeless tobacco: Never Used  . Alcohol use No  . Drug use: No  . Sexual activity: Yes   Other Topics Concern  . Not on file   Social History Narrative  . No narrative on file    FAMILY HISTORY: Family History  Problem Relation Age of Onset  . Arthritis Mother   . Cancer Mother   . Hypertension Sister   . Asthma Brother   . Breast cancer Neg Hx     ALLERGIES:  is allergic to amoxicillin.  MEDICATIONS:  Current Outpatient Prescriptions  Medication Sig Dispense Refill  . amiodarone (PACERONE) 200 MG tablet Take 200 mg by mouth every morning.     Marland Kitchen aspirin EC 81 MG tablet Take 81 mg by mouth daily.     . cefUROXime (CEFTIN) 250 MG tablet Take 1 tablet (250 mg total) by mouth 2 (two) times daily with a meal. 14 tablet 0  . CRANBERRY PO Take 1 tablet by mouth daily.    Marland Kitchen etodolac (LODINE) 200 MG capsule Take 1 capsule (200 mg total) by mouth every 8 (eight) hours. 12 capsule 0  . ferrous sulfate 325 (65 FE) MG EC tablet Take 1 tablet (325 mg total) by mouth 3 (three) times  daily with meals. 90 tablet 1  . hydrALAZINE (APRESOLINE) 50 MG tablet Take 50 mg by mouth 2 (two) times daily.    Marland Kitchen lisinopril-hydrochlorothiazide (PRINZIDE,ZESTORETIC) 20-25 MG tablet Take 2 tablets by mouth daily. (Patient taking differently: Take 2 tablets by mouth every morning. ) 180 tablet 1  . Multiple Vitamin (MULTIVITAMIN) tablet Take 1 tablet by mouth daily. Reported on 04/01/2016    . traMADol (ULTRAM) 50 MG tablet Take 1 tablet (50 mg total) by mouth every 6 (six) hours as needed. 12 tablet 0   No current facility-administered medications for this visit.       Marland Kitchen  PHYSICAL EXAMINATION: ECOG PERFORMANCE STATUS: 1 - Symptomatic but completely ambulatory  Vitals:    05/12/16 1522  BP: 137/77  Pulse: (!) 54  Resp: 18  Temp: 99.1 F (37.3 C)   Filed Weights   05/12/16 1522  Weight: 167 lb (75.8 kg)    GENERAL: Well-nourished well-developed; Alert, no distress and comfortable.   Accompanied by Her mother. EYES: no pallor or icterus OROPHARYNX: no thrush or ulceration; good dentition  NECK: supple.  LYMPH: Approximate 2 cm submandibular mass felt  no palpable lymphadenopathy in the axillary or inguinal regions LUNGS: Decreased breath sounds at the right lower base.  No wheeze or crackles HEART/CVS: regular rate & rhythm and no murmurs; No lower extremity edema ABDOMEN: abdomen soft, non-tender and normal bowel sounds Musculoskeletal:no cyanosis of digits and no clubbing  PSYCH: alert & oriented x 3 with fluent speech NEURO: no focal motor/sensory deficits SKIN:  no rashes or significant lesions  LABORATORY DATA:  I have reviewed the data as listed Lab Results  Component Value Date   WBC 4.4 05/02/2016   HGB 10.3 (L) 05/02/2016   HCT 30.4 (L) 05/02/2016   MCV 80.1 05/02/2016   PLT 182 05/02/2016    Recent Labs  03/10/16 0932 04/07/16 1103 04/30/16 1644 05/02/16 1527  NA 139 139 137 136  K 3.8 3.8 3.7 4.1  CL 98 99 102 101  CO2 23 24 27 25   GLUCOSE 106* 95 107* 120*  BUN 22 27* 36* 26*  CREATININE 0.89 0.79 1.12* 0.88  CALCIUM 9.3 9.0 9.3 9.8  GFRNONAA 72 83 53* >60  GFRAA 83 95 >60 >60  PROT 6.8  --  7.2 7.1  ALBUMIN 3.7  --  3.4* 3.4*  AST 23  --  22 22  ALT 21  --  16 19  ALKPHOS 77  --  56 60  BILITOT 0.3  --  0.8 0.5    RADIOGRAPHIC STUDIES: I have personally reviewed the radiological images as listed and agreed with the findings in the report. Dg Thoracic Spine 2 View  Result Date: 05/02/2016 CLINICAL DATA:  Mid and low back pain for 1 week. No known injury. Initial encounter. EXAM: THORACIC SPINE 2 VIEWS COMPARISON:  PA and lateral chest 12/05/2014. FINDINGS: There is no evidence of thoracic spine fracture.  Alignment is normal. Anterior endplate spurring is unchanged. No other significant bone abnormalities are identified. IMPRESSION: No acute abnormality.  Stable compared to prior exam. Electronically Signed   By: Inge Rise M.D.   On: 05/02/2016 16:48   Dg Lumbar Spine 2-3 Views  Result Date: 05/02/2016 CLINICAL DATA:  Mid and low back pain for 1 week. No known injury. Initial encounter. EXAM: LUMBAR SPINE - 2-3 VIEW COMPARISON:  None. FINDINGS: There is no evidence of lumbar spine fracture. Alignment is normal. Intervertebral disc spaces are maintained. IMPRESSION: Negative exam.  Electronically Signed   By: Inge Rise M.D.   On: 05/02/2016 16:48   Ct Chest W Contrast  Result Date: 05/02/2016 CLINICAL DATA:  Increasing upper back pain EXAM: CT CHEST WITH CONTRAST TECHNIQUE: Multidetector CT imaging of the chest was performed during intravenous contrast administration. CONTRAST:  98m ISOVUE-300 IOPAMIDOL (ISOVUE-300) INJECTION 61% COMPARISON:  None. FINDINGS: Cardiovascular: Thoracic aorta is within normal limits. The pulmonary artery shows a normal branching pattern. No definitive pulmonary emboli are noted although the timing of the contrast bolus was not optimal. Mediastinum/Nodes: Small hilar lymph nodes are seen the thoracic inlet shows some hypodensities within the left lobe of the thyroid. The largest of these measures 14 mm in greatest dimension. This is stable from 02/26/2016 Lungs/Pleura: Lungs are well aerated bilaterally. Minimal infiltrative changes are noted in the right lower lobe with small associated pleural effusion. A few scattered calcified granulomas are noted. Minimal atelectatic changes are noted in the left base as well. Upper Abdomen: There is a hypodense lesion within the liver best seen on image number 108 of series 2. Although this may represent a hemangioma possibility of metastatic disease cannot be totally excluded given the known salivary gland mass. Remainder of the  upper abdomen is within normal limits. Musculoskeletal: No acute bony abnormality noted. IMPRESSION: Patchy infiltrates in the bases bilaterally. Left thyroid nodule stable from previous exam. Hypodensity within the liver of uncertain etiology. Further workup is recommended. This can be done on a nonemergent basis. Electronically Signed   By: MInez CatalinaM.D.   On: 05/02/2016 17:59   [place]  Result Date: 04/29/2016 INDICATION: 60year old with a left submandibular lesion. Tissue diagnosis is needed. EXAM: ULTRASOUND GUIDED FINE-NEEDLE ASPIRATION OF SALIVARY GLAND LESION MEDICATIONS: None. ANESTHESIA/SEDATION: Fentanyl 25 mcg The patient was continuously monitored during the procedure by the interventional radiology nurse under my direct supervision. FLUOROSCOPY TIME:  None COMPLICATIONS: None immediate. PROCEDURE: The procedure was explained to the patient. The risks and benefits of the procedure were discussed and the patient's questions were addressed. Informed consent was obtained from the patient. Patient was placed supine on the ultrasound table. The left side of the neck was evaluated with ultrasound. Heterogeneous hypoechoic nodule within the left submandibular gland was identified. Skin was prepped with chlorhexidine. A sterile field was created. Skin was anesthetized with 1% lidocaine. Using ultrasound guidance, fine-needle aspirations were obtained of this lesion with 25 gauge needles. Total of 4 fine-needle aspirations were performed. Bandage placed over the puncture site. FINDINGS: 2.0 x 1.5 x 1.5 cm heterogeneous lesion in the salivary gland. Evidence for small adjacent nodules or lymph nodes. Needle position confirmed within the lesion. IMPRESSION: Successful ultrasound-guided fine-needle aspiration of the left submandibular lesion. Electronically Signed   By: AMarkus DaftM.D.   On: 04/29/2016 17:58   [place]ous Img Upper Uni Right  Result Date: 05/03/2016 CLINICAL DATA:  Acute onset of right  arm pain.  Initial encounter. EXAM: RIGHT UPPER EXTREMITY VENOUS DOPPLER ULTRASOUND TECHNIQUE: Gray-scale sonography with graded compression, as well as color Doppler and duplex ultrasound were performed to evaluate the upper extremity deep venous system from the level of the subclavian vein and including the jugular, axillary, basilic, radial, ulnar and upper cephalic vein. Spectral Doppler was utilized to evaluate flow at rest and with distal augmentation maneuvers. COMPARISON:  None. FINDINGS: Contralateral Subclavian Vein: Respiratory phasicity is normal and symmetric with the symptomatic side. No evidence of thrombus. Normal compressibility. Internal Jugular Vein: No evidence of thrombus. Normal compressibility, respiratory  phasicity and response to augmentation. Subclavian Vein: No evidence of thrombus. Normal compressibility, respiratory phasicity and response to augmentation. Axillary Vein: No evidence of thrombus. Normal compressibility, respiratory phasicity and response to augmentation. Cephalic Vein: No evidence of thrombus. Normal compressibility, respiratory phasicity and response to augmentation. Basilic Vein: No evidence of thrombus. Normal compressibility, respiratory phasicity and response to augmentation. Brachial Veins: No evidence of thrombus. Normal compressibility, respiratory phasicity and response to augmentation. Radial Veins: No evidence of thrombus. Normal compressibility, respiratory phasicity and response to augmentation. Ulnar Veins: No evidence of thrombus. Normal compressibility, respiratory phasicity and response to augmentation. Venous Reflux:  None visualized. Other Findings: A 9 x 8 x 4 mm lymph node is noted along the right forearm, just below the elbow, at the patient's site of symptoms. IMPRESSION: 1. No evidence of deep venous thrombosis. 2. 8 mm lymph node along the right forearm, just below the elbow, at the patient's site of symptoms. Would correlate for any evidence of  infection. Electronically Signed   By: Garald Balding M.D.   On: 05/03/2016 05:15   US Abdomen Limited Ruq  Result Date: 05/02/2016 CLINICAL DATA:  Right flank pain for 1 week. Hypoattenuating lesion identified in the liver on chest CT today. EXAM: US ABDOMEN LIMITED - RIGHT UPPER QUADRANT COMPARISON:  Chest CT this same day. FINDINGS: Gallbladder: No stones are identified. Tiny amount of gallbladder sludge is seen. No wall thickening or pericholecystic fluid. Sonographer reports negative Murphy's sign. Common bile duct: Diameter: 0.6 cm. Liver: Hyperechoic lesion the right hepatic lobe measures 1.9 x 1.0 x 1.1 cm consistent with a hemangioma. The liver is otherwise unremarkable. A nonobstructing stone is seen in the right kidney measuring 0.8 cm. IMPRESSION: Lesion in the right hepatic lobe is consistent with a hemangioma. The liver is otherwise unremarkable. Very small amount of gallbladder sludge. Negative for cholecystitis. 0.8 cm nonobstructing stone right kidney. Electronically Signed   By: Inge Rise M.D.   On: 05/02/2016 20:39    ASSESSMENT & PLAN:   Anemia, unspecified Anemia unclear etiology- ironstudies suggestive of low saturation/ elevated ferritin-  Suggestive of anemiaof chroni diseae. The etiology is uncler.  #  Given the intermittentabdinal pain recommend CT of the abdomen pelvis.  #  Discussed the patient and her mother- that if above workup is unrevealing; a bone marrow biopsy would be indicated for further evaluation.  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  Discused with Dr. Pryor Ochoa. This does not seem to correlate with patient's anemia.  # [id number] cell. We'll contact patient after the CT scan- regarding the need for bone marrow biopsy. Discussed the procedure and potential complications.  # Follow up in 2-3 weeks/CBC.    All questions were answered. The  patient knows to call the clinic with any problems, questions or concerns.     Cammie Sickle, MD 05/12/2016 6:49 PM

## 2016-05-12 NOTE — Assessment & Plan Note (Addendum)
Anemia unclear etiology- ironstudies suggestive of low saturation/ elevated ferritin-  Suggestive of anemiaof chroni diseae. The etiology is uncler.  #  Given the intermittentabdinal pain recommend CT of the abdomen pelvis.  #  Discussed the patient and her mother- that if above workup is unrevealing; a bone marrow biopsy would be indicated for further evaluation.  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  Discused with Dr. Pryor Ochoa. This does not seem to correlate with patient's anemia.  # [id number] cell. We'll contact patient after the CT scan- regarding the need for bone marrow biopsy. Discussed the procedure and potential complications.  # Follow up in 2-3 weeks/CBC.

## 2016-05-13 ENCOUNTER — Other Ambulatory Visit: Payer: Self-pay

## 2016-05-13 DIAGNOSIS — D509 Iron deficiency anemia, unspecified: Secondary | ICD-10-CM

## 2016-05-14 ENCOUNTER — Ambulatory Visit: Admission: RE | Admit: 2016-05-14 | Payer: Medicare Other | Source: Ambulatory Visit

## 2016-05-19 ENCOUNTER — Encounter (INDEPENDENT_AMBULATORY_CARE_PROVIDER_SITE_OTHER): Payer: Self-pay

## 2016-05-20 ENCOUNTER — Ambulatory Visit
Admission: RE | Admit: 2016-05-20 | Discharge: 2016-05-20 | Disposition: A | Discharge status: Home / Self Care | Payer: Medicare Other | Source: Ambulatory Visit | Attending: Internal Medicine | Admitting: Internal Medicine

## 2016-05-20 DIAGNOSIS — I700 Atherosclerosis of aorta: Secondary | ICD-10-CM | POA: Diagnosis not present

## 2016-05-20 DIAGNOSIS — D649 Anemia, unspecified: Secondary | ICD-10-CM | POA: Diagnosis not present

## 2016-05-20 DIAGNOSIS — K449 Diaphragmatic hernia without obstruction or gangrene: Secondary | ICD-10-CM | POA: Diagnosis not present

## 2016-05-20 DIAGNOSIS — K7689 Other specified diseases of liver: Secondary | ICD-10-CM | POA: Diagnosis not present

## 2016-05-20 DIAGNOSIS — I517 Cardiomegaly: Secondary | ICD-10-CM | POA: Insufficient documentation

## 2016-05-20 DIAGNOSIS — R1084 Generalized abdominal pain: Secondary | ICD-10-CM | POA: Diagnosis not present

## 2016-05-20 DIAGNOSIS — D1803 Hemangioma of intra-abdominal structures: Secondary | ICD-10-CM | POA: Insufficient documentation

## 2016-05-20 MED ORDER — IOPAMIDOL (ISOVUE-300) INJECTION 61%
100.0000 mL | Freq: Once | INTRAVENOUS | Status: AC | PRN
  Administered 2016-05-20: 100 mL via INTRAVENOUS

## 2016-05-21 ENCOUNTER — Telehealth: Payer: Self-pay | Admitting: Internal Medicine

## 2016-05-21 NOTE — Telephone Encounter (Signed)
Spoke to pt re: CT a/p- neg for any acute findings. Recommend bone marrow bx 1-2 weeks; re: back pain- start advil/ ice pack. Etc.   Heather- please set up bmbx.

## 2016-05-24 ENCOUNTER — Other Ambulatory Visit: Payer: Self-pay | Admitting: *Deleted

## 2016-05-24 DIAGNOSIS — D649 Anemia, unspecified: Secondary | ICD-10-CM

## 2016-05-25 ENCOUNTER — Other Ambulatory Visit: Payer: Self-pay | Admitting: *Deleted

## 2016-05-25 ENCOUNTER — Ambulatory Visit (INDEPENDENT_AMBULATORY_CARE_PROVIDER_SITE_OTHER): Payer: Medicare Other | Admitting: Family Medicine

## 2016-05-25 ENCOUNTER — Encounter: Payer: Self-pay | Admitting: Family Medicine

## 2016-05-25 VITALS — BP 115/71 | HR 65 | Temp 98.3°F | Wt 168.0 lb

## 2016-05-25 DIAGNOSIS — M545 Low back pain, unspecified: Secondary | ICD-10-CM

## 2016-05-25 DIAGNOSIS — D649 Anemia, unspecified: Secondary | ICD-10-CM

## 2016-05-25 MED ORDER — NAPROXEN 500 MG PO TABS: 500.0000 mg | ORAL_TABLET | Freq: Two times a day (BID) | ORAL | 1 refills | Status: DC

## 2016-05-25 MED ORDER — CYCLOBENZAPRINE HCL 10 MG PO TABS: 10.0000 mg | ORAL_TABLET | Freq: Every day | ORAL | 1 refills | Status: DC

## 2016-05-25 NOTE — Progress Notes (Signed)
BP 115/71 (BP Location: Left Arm, Patient Position: Sitting, Cuff Size: Large)   Pulse 65   Temp 98.3 F (36.8 C)   Wt 168 lb (76.2 kg)   LMP  (Exact Date)   SpO2 98%   BMI 30.73 kg/m    Subjective:    Patient ID: Kristina Henson, female    DOB: 1956-06-17, 60 y.o.   MRN: [id number]  HPI: Kristina Henson is a 60 y.o. female  Chief Complaint  Patient presents with  . Back Pain   Notes that her back is hurting still. Having muscle aches in her neck and in her ribs as well. Having muscle spasms in the same places. Has only done exercises a few time. Has not been taking her medicine for quite a while since she ran out. She notes that her pain is worse with movement and better with rest and icy hot. Still radiating down her R leg. She is otherwise doing OK with no other concerns or complaints at this time.   Relevant past medical, surgical, family and social history reviewed and updated as indicated. Interim medical history since our last visit reviewed. Allergies and medications reviewed and updated.  Review of Systems  Constitutional: Negative.   Respiratory: Negative.   Cardiovascular: Negative.   Musculoskeletal: Positive for back pain, myalgias, neck pain and neck stiffness. Negative for arthralgias, gait problem and joint swelling.  Psychiatric/Behavioral: Negative.     Per HPI unless specifically indicated above     Objective:    BP 115/71 (BP Location: Left Arm, Patient Position: Sitting, Cuff Size: Large)   Pulse 65   Temp 98.3 F (36.8 C)   Wt 168 lb (76.2 kg)   LMP  (Exact Date)   SpO2 98%   BMI 30.73 kg/m   Wt Readings from Last 3 Encounters:  05/25/16 168 lb (76.2 kg)  05/12/16 167 lb (75.8 kg)  05/11/16 167 lb (75.8 kg)    Physical Exam  Constitutional: She is oriented to person, place, and time. She appears well-developed and well-nourished. No distress.  HENT:  Head: Normocephalic and atraumatic.  Right Ear: Hearing normal.  Left Ear: Hearing  normal.  Nose: Nose normal.  Eyes: Conjunctivae and lids are normal. Right eye exhibits no discharge. Left eye exhibits no discharge. No scleral icterus.  Cardiovascular: Normal rate, regular rhythm, normal heart sounds and intact distal pulses.  Exam reveals no gallop and no friction rub.   No murmur heard. Pulmonary/Chest: Effort normal and breath sounds normal. No respiratory distress. She has no wheezes. She has no rales. She exhibits no tenderness.  Musculoskeletal:  Muscle hypertonicity in low back R>L, Normal neurologic exam, negative straight leg raise.   Neurological: She is alert and oriented to person, place, and time. She has normal reflexes. She displays normal reflexes. She exhibits normal muscle tone. Coordination normal.  Skin: Skin is warm, dry and intact. No rash noted. No erythema. No pallor.  Psychiatric: She has a normal mood and affect. Her speech is normal and behavior is normal. Judgment and thought content normal. Cognition and memory are normal.  Nursing note and vitals reviewed.   Results for orders placed or performed during the hospital encounter of 05/02/16  Urine culture  Result Value Ref Range   Specimen Description URINE, RANDOM    Special Requests NONE    Culture (A)     >=100,000 COLONIES/mL GROUP B STREP(S.AGALACTIAE)ISOLATED There is no known Penicillin Resistant Beta Streptococcus in the [place] For patients that  are Penicillin-allergic, Erythromycin is 85-94% susceptible, and Clindamycin is 80% susceptible.  Contact Microbiology within 7 days if sensitivity testing is  required.   Virtually 100% of S. agalactiae (Group B) strains are susceptible to Penicillin.  For Penicillin-allergic patients, Erythromycin (85-95% sensitive) and Clindamycin (80% sensitive) are drugs of choice. Contact microbiology lab to request sensitivities if  needed within 7 days. Performed at [facility]    Report Status 05/04/2016 FINAL   Lipase, blood  Result Value Ref  Range   Lipase 19 11 - 51 U/L  Comprehensive metabolic panel  Result Value Ref Range   Sodium 136 135 - 145 mmol/L   Potassium 4.1 3.5 - 5.1 mmol/L   Chloride 101 101 - 111 mmol/L   CO2 25 22 - 32 mmol/L   Glucose, Bld 120 (H) 65 - 99 mg/dL   BUN 26 (H) 6 - 20 mg/dL   Creatinine, Ser 0.88 0.44 - 1.00 mg/dL   Calcium 9.8 8.9 - 10.3 mg/dL   Total Protein 7.1 6.5 - 8.1 g/dL   Albumin 3.4 (L) 3.5 - 5.0 g/dL   AST 22 15 - 41 U/L   ALT 19 14 - 54 U/L   Alkaline Phosphatase 60 38 - 126 U/L   Total Bilirubin 0.5 0.3 - 1.2 mg/dL   GFR calc non Af Amer >60 >60 mL/min   GFR calc Af Amer >60 >60 mL/min   Anion gap 10 5 - 15  CBC  Result Value Ref Range   WBC 4.4 3.6 - 11.0 K/uL   RBC 3.79 (L) 3.80 - 5.20 MIL/uL   Hemoglobin 10.3 (L) 12.0 - 16.0 g/dL   HCT 30.4 (L) 35.0 - 47.0 %   MCV 80.1 80.0 - 100.0 fL   MCH 27.2 26.0 - 34.0 pg   MCHC 33.9 32.0 - 36.0 g/dL   RDW 16.9 (H) 11.5 - 14.5 %   Platelets 182 150 - 440 K/uL  Urinalysis complete, with microscopic  Result Value Ref Range   Color, Urine YELLOW (A) YELLOW   APPearance HAZY (A) CLEAR   Glucose, UA NEGATIVE NEGATIVE mg/dL   Bilirubin Urine NEGATIVE NEGATIVE   Ketones, ur NEGATIVE NEGATIVE mg/dL   Specific Gravity, Urine 1.013 1.005 - 1.030   Hgb urine dipstick NEGATIVE NEGATIVE   pH 7.0 5.0 - 8.0   Protein, ur NEGATIVE NEGATIVE mg/dL   Nitrite NEGATIVE NEGATIVE   Leukocytes, UA 2+ (A) NEGATIVE   RBC / HPF 0-5 0 - 5 RBC/hpf   WBC, UA 6-30 0 - 5 WBC/hpf   Bacteria, UA RARE (A) NONE SEEN   Squamous Epithelial / LPF 6-30 (A) NONE SEEN   Mucous PRESENT       Assessment & Plan:   Problem List Items Addressed This Visit    None    Visit Diagnoses    Right-sided low back pain without sciatica    -  Primary   Will get her on naproxen and flexeril. Referral to PT put in. Recheck 1 month. Call if not doing better or doing worse.    Relevant Medications   cyclobenzaprine (FLEXERIL) 10 MG tablet   naproxen (NAPROSYN) 500 MG  tablet       Follow up plan: No Follow-up on file.

## 2016-06-01 ENCOUNTER — Encounter: Payer: Self-pay | Admitting: *Deleted

## 2016-06-01 ENCOUNTER — Other Ambulatory Visit: Payer: Self-pay | Admitting: Radiology

## 2016-06-02 ENCOUNTER — Ambulatory Visit: Payer: Medicare Other | Admitting: Internal Medicine

## 2016-06-02 ENCOUNTER — Encounter: Payer: Self-pay | Admitting: Internal Medicine

## 2016-06-02 ENCOUNTER — Ambulatory Visit
Admission: RE | Admit: 2016-06-02 | Discharge: 2016-06-02 | Disposition: A | Discharge status: Home / Self Care | Payer: Medicare Other | Source: Ambulatory Visit | Attending: Internal Medicine | Admitting: Internal Medicine

## 2016-06-02 ENCOUNTER — Other Ambulatory Visit: Payer: Medicare Other

## 2016-06-02 DIAGNOSIS — D704 Cyclic neutropenia: Secondary | ICD-10-CM | POA: Insufficient documentation

## 2016-06-02 DIAGNOSIS — D649 Anemia, unspecified: Secondary | ICD-10-CM | POA: Insufficient documentation

## 2016-06-02 DIAGNOSIS — I4891 Unspecified atrial fibrillation: Secondary | ICD-10-CM | POA: Insufficient documentation

## 2016-06-02 DIAGNOSIS — I10 Essential (primary) hypertension: Secondary | ICD-10-CM | POA: Diagnosis not present

## 2016-06-02 DIAGNOSIS — Z8673 Personal history of transient ischemic attack (TIA), and cerebral infarction without residual deficits: Secondary | ICD-10-CM | POA: Insufficient documentation

## 2016-06-02 DIAGNOSIS — M47896 Other spondylosis, lumbar region: Secondary | ICD-10-CM | POA: Diagnosis not present

## 2016-06-02 DIAGNOSIS — D72819 Decreased white blood cell count, unspecified: Secondary | ICD-10-CM | POA: Insufficient documentation

## 2016-06-02 HISTORY — DX: Atherosclerotic heart disease of native coronary artery without angina pectoris: I25.10

## 2016-06-02 LAB — DIFFERENTIAL
BASOS PCT: 0 %
BLASTS: 0 %
Band Neutrophils: 0 %
Basophils Absolute: 0 10*3/uL (ref 0–0.1)
EOS PCT: 0 %
Eosinophils Absolute: 0 10*3/uL (ref 0–0.7)
LYMPHS PCT: 19 %
Lymphs Abs: 0.5 10*3/uL — ABNORMAL LOW (ref 1.0–3.6)
MONOS PCT: 7 %
Metamyelocytes Relative: 1 %
Monocytes Absolute: 0.2 10*3/uL (ref 0.2–0.9)
Myelocytes: 0 %
NRBC: 0 /100{WBCs}
Neutro Abs: 2 10*3/uL (ref 1.4–6.5)
Neutrophils Relative %: 73 %
Other: 0 %
Promyelocytes Absolute: 0 %

## 2016-06-02 LAB — CBC
HCT: 29.6 % — ABNORMAL LOW (ref 35.0–47.0)
Hemoglobin: 10 g/dL — ABNORMAL LOW (ref 12.0–16.0)
MCH: 26.8 pg (ref 26.0–34.0)
MCHC: 33.8 g/dL (ref 32.0–36.0)
MCV: 79.1 fL — ABNORMAL LOW (ref 80.0–100.0)
PLATELETS: 188 10*3/uL (ref 150–440)
RBC: 3.74 MIL/uL — ABNORMAL LOW (ref 3.80–5.20)
RDW: 17.7 % — AB (ref 11.5–14.5)
WBC: 2.7 10*3/uL — AB (ref 3.6–11.0)

## 2016-06-02 LAB — PROTIME-INR
INR: 1.18
Prothrombin Time: 15.1 seconds (ref 11.4–15.2)

## 2016-06-02 LAB — APTT: aPTT: 33 seconds (ref 24–36)

## 2016-06-02 MED ORDER — HYDROCODONE-ACETAMINOPHEN 5-325 MG PO TABS
1.0000 | ORAL_TABLET | ORAL | Status: DC | PRN
  Filled 2016-06-02: qty 2

## 2016-06-02 MED ORDER — FENTANYL CITRATE (PF) 100 MCG/2ML IJ SOLN
INTRAMUSCULAR | Status: AC | PRN
  Administered 2016-06-02 (×2): 50 ug via INTRAVENOUS

## 2016-06-02 MED ORDER — SODIUM CHLORIDE 0.9 % IV SOLN
INTRAVENOUS | Status: DC
  Administered 2016-06-02: 09:00:00 via INTRAVENOUS

## 2016-06-02 MED ORDER — MIDAZOLAM HCL 2 MG/2ML IJ SOLN
INTRAMUSCULAR | Status: AC | PRN
  Administered 2016-06-02 (×3): 1 mg via INTRAVENOUS

## 2016-06-02 NOTE — Procedures (Signed)
Under CT guidance, bone marrow aspiration and biopsy was performed. No immediate complication.

## 2016-06-02 NOTE — Discharge Instructions (Signed)
Bone Marrow Aspiration and Bone Marrow Biopsy, Care After Refer to this sheet in the next few weeks. These instructions provide you with information about caring for yourself after your procedure. Your health care provider may also give you more specific instructions. Your treatment has been planned according to current medical practices, but problems sometimes occur. Call your health care provider if you have any problems or questions after your procedure. WHAT TO EXPECT AFTER THE PROCEDURE After your procedure, it is common to have:  Soreness or tenderness around the puncture site.  Bruising. HOME CARE INSTRUCTIONS  Take medicines only as directed by your health care provider.  Follow your health care provider's instructions about:  Puncture site care.  Bandage (dressing) changes and removal.  Bathe and shower as directed by your health care provider.  Check your puncture site every day for signs of infection. Watch for:  Redness, swelling, or pain.  Fluid, blood, or pus.  Return to your normal activities as directed by your health care provider.  Keep all follow-up visits as directed by your health care provider. This is important. SEEK MEDICAL CARE IF:  You have a fever.  You have uncontrollable bleeding.  You have redness, swelling, or pain at the site of your puncture.  You have fluid, blood, or pus coming from your puncture site.   This information is not intended to replace advice given to you by your health care provider. Make sure you discuss any questions you have with your health care provider.

## 2016-06-02 NOTE — Procedures (Signed)
Procedure and risks discussed with patient. Informed consent obtained. Will perform CT-guided bone marrow biopsy. 

## 2016-06-02 NOTE — Sedation Documentation (Signed)
Local anesthetic given by Dr Nyoka Cowden

## 2016-06-03 DIAGNOSIS — M545 Low back pain: Secondary | ICD-10-CM | POA: Diagnosis not present

## 2016-06-07 DIAGNOSIS — M545 Low back pain: Secondary | ICD-10-CM | POA: Diagnosis not present

## 2016-06-09 DIAGNOSIS — M545 Low back pain: Secondary | ICD-10-CM | POA: Diagnosis not present

## 2016-06-10 ENCOUNTER — Telehealth: Payer: Self-pay | Admitting: Family Medicine

## 2016-06-10 NOTE — Telephone Encounter (Signed)
Verbal from Dr. Wynetta Emery that it is ok for patient to take them together, just watch out for excessive bleeding or bruising.

## 2016-06-10 NOTE — Telephone Encounter (Signed)
Pt would like to find out if she can still take her aspirin while she is taking naproxen.

## 2016-06-14 ENCOUNTER — Inpatient Hospital Stay: Payer: Medicare Other | Attending: Internal Medicine | Admitting: Internal Medicine

## 2016-06-14 ENCOUNTER — Inpatient Hospital Stay: Payer: Medicare Other

## 2016-06-14 VITALS — BP 117/73 | HR 74 | Temp 98.1°F | Wt 168.6 lb

## 2016-06-14 DIAGNOSIS — K769 Liver disease, unspecified: Secondary | ICD-10-CM | POA: Diagnosis not present

## 2016-06-14 DIAGNOSIS — Z809 Family history of malignant neoplasm, unspecified: Secondary | ICD-10-CM | POA: Insufficient documentation

## 2016-06-14 DIAGNOSIS — I4891 Unspecified atrial fibrillation: Secondary | ICD-10-CM | POA: Diagnosis not present

## 2016-06-14 DIAGNOSIS — Z8701 Personal history of pneumonia (recurrent): Secondary | ICD-10-CM | POA: Insufficient documentation

## 2016-06-14 DIAGNOSIS — G8929 Other chronic pain: Secondary | ICD-10-CM | POA: Insufficient documentation

## 2016-06-14 DIAGNOSIS — D649 Anemia, unspecified: Secondary | ICD-10-CM | POA: Insufficient documentation

## 2016-06-14 DIAGNOSIS — D1803 Hemangioma of intra-abdominal structures: Secondary | ICD-10-CM | POA: Diagnosis not present

## 2016-06-14 DIAGNOSIS — D72819 Decreased white blood cell count, unspecified: Secondary | ICD-10-CM

## 2016-06-14 DIAGNOSIS — Z803 Family history of malignant neoplasm of breast: Secondary | ICD-10-CM

## 2016-06-14 DIAGNOSIS — I517 Cardiomegaly: Secondary | ICD-10-CM | POA: Insufficient documentation

## 2016-06-14 DIAGNOSIS — D72810 Lymphocytopenia: Secondary | ICD-10-CM | POA: Diagnosis not present

## 2016-06-14 DIAGNOSIS — M129 Arthropathy, unspecified: Secondary | ICD-10-CM | POA: Insufficient documentation

## 2016-06-14 DIAGNOSIS — I10 Essential (primary) hypertension: Secondary | ICD-10-CM | POA: Diagnosis not present

## 2016-06-14 DIAGNOSIS — I2510 Atherosclerotic heart disease of native coronary artery without angina pectoris: Secondary | ICD-10-CM

## 2016-06-14 DIAGNOSIS — I700 Atherosclerosis of aorta: Secondary | ICD-10-CM | POA: Diagnosis not present

## 2016-06-14 DIAGNOSIS — Z8673 Personal history of transient ischemic attack (TIA), and cerebral infarction without residual deficits: Secondary | ICD-10-CM | POA: Insufficient documentation

## 2016-06-14 DIAGNOSIS — M109 Gout, unspecified: Secondary | ICD-10-CM | POA: Diagnosis not present

## 2016-06-14 DIAGNOSIS — Z7982 Long term (current) use of aspirin: Secondary | ICD-10-CM | POA: Insufficient documentation

## 2016-06-14 DIAGNOSIS — M549 Dorsalgia, unspecified: Secondary | ICD-10-CM | POA: Diagnosis not present

## 2016-06-14 DIAGNOSIS — R0602 Shortness of breath: Secondary | ICD-10-CM | POA: Insufficient documentation

## 2016-06-14 DIAGNOSIS — Z79899 Other long term (current) drug therapy: Secondary | ICD-10-CM | POA: Insufficient documentation

## 2016-06-14 DIAGNOSIS — D509 Iron deficiency anemia, unspecified: Secondary | ICD-10-CM

## 2016-06-14 DIAGNOSIS — M545 Low back pain: Secondary | ICD-10-CM | POA: Diagnosis not present

## 2016-06-14 LAB — CBC WITH DIFFERENTIAL/PLATELET
Basophils Absolute: 0 10*3/uL (ref 0–0.1)
Basophils Relative: 1 %
EOS ABS: 0 10*3/uL (ref 0–0.7)
Eosinophils Relative: 1 %
HEMATOCRIT: 28 % — AB (ref 35.0–47.0)
HEMOGLOBIN: 9.1 g/dL — AB (ref 12.0–16.0)
LYMPHS ABS: 0.7 10*3/uL — AB (ref 1.0–3.6)
LYMPHS PCT: 28 %
MCH: 25.4 pg — AB (ref 26.0–34.0)
MCHC: 32.7 g/dL (ref 32.0–36.0)
MCV: 77.7 fL — AB (ref 80.0–100.0)
MONOS PCT: 6 %
Monocytes Absolute: 0.2 10*3/uL (ref 0.2–0.9)
NEUTROS PCT: 64 %
Neutro Abs: 1.6 10*3/uL (ref 1.4–6.5)
Platelets: 187 10*3/uL (ref 150–440)
RBC: 3.6 MIL/uL — ABNORMAL LOW (ref 3.80–5.20)
RDW: 18 % — ABNORMAL HIGH (ref 11.5–14.5)
WBC: 2.5 10*3/uL — ABNORMAL LOW (ref 3.6–11.0)

## 2016-06-14 NOTE — Progress Notes (Signed)
[place] NOTE  Patient Care Team: Valerie Roys, DO as PCP - General (Family Medicine) Teodoro Spray, MD as Consulting Physician (Cardiology) Carloyn Manner, MD as Referring Physician (Otolaryngology)  CHIEF COMPLAINTS/PURPOSE OF CONSULTATION:   # Anemia of chronic disease- EGD/ [place] [Dr.Wohl; 2017] chronic back pain [? 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    No history exists.     HISTORY OF PRESENTING ILLNESS:  Kristina Henson 60 y.o.  female noted Noted to have anemia of unclear etiology-is here to review the workup.  Patient continues to complain of back pain.; She is currently getting physical therapy. Patient complains of fatigue. Shortness of breath on exertion. Complains of intermittent abdominal pain. She does recent ultrasound of the abdomen; showed question hepatic cysts.   ROS: A complete 10 point review of system is done which is negative except mentioned above in history of present illness  MEDICAL HISTORY:  Past Medical History:  Diagnosis Date  . Anemia   . Arthritis    BACK-LUMBAR  . Atrial fibrillation ([place])   . Coronary artery disease   . Dysrhythmia   . Hypertension   . Mass in neck    lt side  . Pneumonia 2016  . Stroke ([place]) 2012  . Stroke [facility]) 2012    SURGICAL HISTORY: Past Surgical History:  Procedure Laterality Date  . ABDOMINAL HYSTERECTOMY     Total  . cardiac catherization    . COLONOSCOPY WITH PROPOFOL N/A 11/18/2015   Procedure: COLONOSCOPY WITH PROPOFOL;  Surgeon: Lucilla Lame, MD;  Location: [facility] ENDOSCOPY;  Service: Endoscopy;  Laterality: N/A;  . CORONARY ANGIOPLASTY    . ESOPHAGOGASTRODUODENOSCOPY (EGD) WITH PROPOFOL N/A 04/23/2016   Procedure: ESOPHAGOGASTRODUODENOSCOPY (EGD) WITH PROPOFOL;  Surgeon:  Lucilla Lame, MD;  Location: [facility];  Service: Endoscopy;  Laterality: N/A;  small bowel bx gastric antrum bx  . throat biopsy      SOCIAL HISTORY: Social History   Social History  . Marital status: Divorced    Spouse name: N/A  . Number of children: N/A  . Years of education: N/A   Occupational History  . Not on file.   Social History Main Topics  . Smoking status: Never Smoker  . Smokeless tobacco: Never Used  . Alcohol use No  . Drug use: No  . Sexual activity: Yes   Other Topics Concern  . Not on file   Social History Narrative  . No narrative on file    FAMILY HISTORY: Family History  Problem Relation Age of Onset  . Arthritis Mother   . Cancer Mother   . Hypertension Sister   . Asthma Brother   . Breast cancer Neg Hx     ALLERGIES:  is allergic to amoxicillin.  MEDICATIONS:  Current Outpatient Prescriptions  Medication Sig Dispense Refill  . amiodarone (PACERONE) 200 MG tablet Take 200 mg by mouth every morning.     Marland Kitchen aspirin EC 81 MG tablet Take 81 mg by mouth daily.     Marland Kitchen CRANBERRY PO Take 1 tablet by mouth daily.    . cyclobenzaprine (FLEXERIL) 10 MG tablet Take 1 tablet (10 mg total) by mouth at bedtime. 30 tablet 1  . ferrous sulfate 325 (65 FE) MG EC tablet Take 1 tablet (325 mg total) by mouth 3 (three) times daily with  meals. 90 tablet 1  . hydrALAZINE (APRESOLINE) 50 MG tablet Take 50 mg by mouth 2 (two) times daily.    Marland Kitchen lisinopril-hydrochlorothiazide (PRINZIDE,ZESTORETIC) 20-25 MG tablet Take 2 tablets by mouth daily. (Patient taking differently: Take 2 tablets by mouth every morning. ) 180 tablet 1  . Multiple Vitamin (MULTIVITAMIN) tablet Take 1 tablet by mouth daily. Reported on 04/01/2016    . naproxen (NAPROSYN) 500 MG tablet Take 1 tablet (500 mg total) by mouth 2 (two) times daily with a meal. 60 tablet 1   No current facility-administered medications for this visit.       Marland Kitchen  PHYSICAL EXAMINATION: ECOG PERFORMANCE  STATUS: 1 - Symptomatic but completely ambulatory  Vitals:   06/14/16 1511  BP: 117/73  Pulse: 74  Temp: 98.1 F (36.7 C)   Filed Weights   06/14/16 1511  Weight: 168 lb 9.6 oz (76.5 kg)    GENERAL: Well-nourished well-developed; Alert, no distress and comfortable.   Accompanied by Her mother. EYES: no pallor or icterus OROPHARYNX: no thrush or ulceration; good dentition  NECK: supple.  LYMPH: Approximate 2 cm submandibular mass felt  no palpable lymphadenopathy in the axillary or inguinal regions LUNGS: Decreased breath sounds at the right lower base.  No wheeze or crackles HEART/CVS: regular rate & rhythm and no murmurs; No lower extremity edema ABDOMEN: abdomen soft, non-tender and normal bowel sounds Musculoskeletal:no cyanosis of digits and no clubbing  PSYCH: alert & oriented x 3 with fluent speech NEURO: no focal motor/sensory deficits SKIN:  no rashes or significant lesions  LABORATORY DATA:  I have reviewed the data as listed Lab Results  Component Value Date   WBC 2.5 (L) 06/14/2016   HGB 9.1 (L) 06/14/2016   HCT 28.0 (L) 06/14/2016   MCV 77.7 (L) 06/14/2016   PLT 187 06/14/2016    Recent Labs  03/10/16 0932 04/07/16 1103 04/30/16 1644 05/02/16 1527  NA 139 139 137 136  K 3.8 3.8 3.7 4.1  CL 98 99 102 101  CO2 23 24 27 25   GLUCOSE 106* 95 107* 120*  BUN 22 27* 36* 26*  CREATININE 0.89 0.79 1.12* 0.88  CALCIUM 9.3 9.0 9.3 9.8  GFRNONAA 72 83 53* >60  GFRAA 83 95 >60 >60  PROT 6.8  --  7.2 7.1  ALBUMIN 3.7  --  3.4* 3.4*  AST 23  --  22 22  ALT 21  --  16 19  ALKPHOS 77  --  56 60  BILITOT 0.3  --  0.8 0.5    RADIOGRAPHIC STUDIES: I have personally reviewed the radiological images as listed and agreed with the findings in the report. Ct Abdomen Pelvis W Contrast  Result Date: 05/20/2016 CLINICAL DATA:  Generalized abdominal and bilateral flank pain for 1 month. Anemia. EXAM: CT ABDOMEN AND PELVIS WITH CONTRAST TECHNIQUE: Multidetector CT  imaging of the abdomen and pelvis was performed using the standard protocol following bolus administration of intravenous contrast. CONTRAST:  170m ISOVUE-300 IOPAMIDOL (ISOVUE-300) INJECTION 61% COMPARISON:  Right upper quadrant ultrasound on 05/02/2016 FINDINGS: Lower chest:  No acute findings.  Moderate cardiomegaly. Hepatobiliary: 1.6 cm low-attenuation lesion in the posterior right hepatic lobe. This has nonspecific characteristics by CT but is hyperechoic on recent ultrasound most consistent with a benign hemangioma. No other liver lesions identified. Gallbladder is unremarkable. Pancreas: No mass, inflammatory changes, or other significant abnormality. Spleen: No evidence of splenomegaly. A 1.6 cm low-attenuation lesion is seen in the dome of the spleen which has  nonspecific characteristics, but is most likely benign in patient without history of cancer. No other splenic lesions identified. Adrenals/Urinary Tract: No masses identified. No evidence of ureteral calculi or hydronephrosis. Unopacified urinary bladder is unremarkable in appearance. Stomach/Bowel: Tiny hiatal hernia. No evidence of obstruction, inflammatory process, or abnormal fluid collections. Normal appendix visualized. Vascular/Lymphatic: No pathologically enlarged lymph nodes. No evidence of abdominal aortic aneurysm. Aortic atherosclerosis. Reproductive: Prior hysterectomy noted. Adnexal regions are unremarkable in appearance. Other: None. Musculoskeletal:  No suspicious bone lesions identified. IMPRESSION: No acute findings identified within the abdomen or pelvis. 1.6 cm low-attenuation lesion in posterior right hepatic lobe is nonspecific by CT, but has features of a benign hemangioma on recent ultrasound. Small low-attenuation lesion in the dome of the spleen is also nonspecific, but likely benign. Recommend followup of these lesions with abdomen MRI without and with contrast in 6 months. This recommendation follows ACR consensus  guidelines: Managing Incidental Findings on Abdominal CT: White Paper of the ACR Incidental Findings Committee. J Am Coll Radiol 2010;7:754-773, and White Paper of the ACR Incidental Findings Committee II on Splenic and Nodal Findings. [place] [phone]. Incidental findings including tiny hiatal hernia, moderate cardiomegaly and aortic atherosclerosis. Electronically Signed   By: Earle Gell M.D.   On: 05/20/2016 13:21   Ct Biopsy  Result Date: 06/02/2016 INDICATION: Anemia. EXAM: CT BIOPSY MEDICATIONS: None. ANESTHESIA/SEDATION: Moderate (conscious) sedation was employed during this procedure. A total of Versed 3.0 mg and Fentanyl 100 mcg was administered intravenously. Moderate Sedation Time: 40 minutes. The patient's level of consciousness and vital signs were monitored continuously by radiology nursing throughout the procedure under my direct supervision. COMPLICATIONS: None immediate. PROCEDURE: Informed written consent was obtained from the patient after a thorough discussion of the procedural risks, benefits and alternatives. All questions were addressed. Sterile Barrier Technique was utilized including mask, sterile gloves, sterile drape, hand hygiene and skin antiseptic. A timeout was performed prior to the initiation of the procedure. Patient was placed prone on CT scanner. Posterior pelvic region was prepped and draped using sterile technique. Local anesthetic was applied. Under CT guidance, 22 gauge spinal needle was directed toward posterior margin of right iliac bone. Local anesthetic was administered subperiosteally. The needle was then removed. Then, also under CT guidance, trocar was directed through posterior margin of right iliac bone. Heparinized and unheparinized bone marrow aspirations were obtained. Then, core biopsy was obtained as well. Needle was removed and appropriate dressing was applied. IMPRESSION: Under CT guidance, successful bone marrow aspiration and core biopsy  was performed of right iliac bone. Electronically Signed   By: Marijo Conception, M.D.   On: 06/02/2016 11:26    ASSESSMENT & PLAN:   Anemia, unspecified # Anemia- likely secondary to anemia of chronic disease [unclear etiology]- bone marrow biopsy negative for any primary bone marrow problems like MDS; cytogenetics normal.  # Anemia of chronic disease- question etiology; ? Rheumatologic issues/especially with chronic back pain. Recommend evaluation with rheumatology  # Mild leukopenia white count 2.5/lymphopenia. Asymptomatic.  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 I will inform Dr.Vaught.   # ? Hemangioma of liver/spleen- recommend follow up MRI.will check with radiology re: imaging type.   # Follow-up in 6 months with labs and scans prior.  All questions were answered. The patient knows to call the clinic with any problems, questions or concerns.  Cammie Sickle, MD 06/14/2016 6:16 PM

## 2016-06-14 NOTE — Progress Notes (Signed)
Patient ambulates without assistance, brought to  exam room 19.  Patient denies pain or discomfort at this time.  Vitals stable and documented

## 2016-06-14 NOTE — Assessment & Plan Note (Addendum)
#  Anemia- likely secondary to anemia of chronic disease [unclear etiology]- bone marrow biopsy negative for any primary bone marrow problems like MDS; cytogenetics normal.  # Anemia of chronic disease- question etiology; ? Rheumatologic issues/especially with chronic back pain. Recommend evaluation with rheumatology  # Mild leukopenia white count 2.5/lymphopenia. Asymptomatic.  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 I will inform Dr.Vaught.   # ? Hemangioma of liver/spleen- recommend follow up MRI.will check with radiology re: imaging type.   # Follow-up in 6 months with labs and scans prior.

## 2016-06-17 DIAGNOSIS — M545 Low back pain: Secondary | ICD-10-CM | POA: Diagnosis not present

## 2016-06-18 ENCOUNTER — Other Ambulatory Visit: Payer: Self-pay | Admitting: *Deleted

## 2016-06-18 DIAGNOSIS — D1803 Hemangioma of intra-abdominal structures: Secondary | ICD-10-CM

## 2016-06-21 DIAGNOSIS — M545 Low back pain: Secondary | ICD-10-CM | POA: Diagnosis not present

## 2016-06-22 ENCOUNTER — Encounter: Payer: Medicare Other | Admitting: Family Medicine

## 2016-06-22 ENCOUNTER — Encounter (INDEPENDENT_AMBULATORY_CARE_PROVIDER_SITE_OTHER): Payer: Self-pay

## 2016-06-24 DIAGNOSIS — M545 Low back pain: Secondary | ICD-10-CM | POA: Diagnosis not present

## 2016-06-28 DIAGNOSIS — M545 Low back pain: Secondary | ICD-10-CM | POA: Diagnosis not present

## 2016-06-29 DIAGNOSIS — Z23 Encounter for immunization: Secondary | ICD-10-CM | POA: Diagnosis not present

## 2016-07-06 ENCOUNTER — Other Ambulatory Visit: Payer: Self-pay | Admitting: Family Medicine

## 2016-07-06 DIAGNOSIS — D117 Benign neoplasm of other major salivary glands: Secondary | ICD-10-CM | POA: Diagnosis not present

## 2016-07-08 ENCOUNTER — Encounter: Payer: Medicare Other | Admitting: Family Medicine

## 2016-07-09 ENCOUNTER — Encounter: Payer: Medicare Other | Admitting: Family Medicine

## 2016-07-12 ENCOUNTER — Telehealth: Payer: Self-pay | Admitting: Family Medicine

## 2016-07-12 NOTE — Telephone Encounter (Signed)
Pt would like a call back to discuss going to [facility].

## 2016-07-13 NOTE — Telephone Encounter (Signed)
Spoke with patient, will come in for an appointment.

## 2016-07-19 ENCOUNTER — Ambulatory Visit (INDEPENDENT_AMBULATORY_CARE_PROVIDER_SITE_OTHER): Payer: Medicare Other | Admitting: Family Medicine

## 2016-07-19 ENCOUNTER — Encounter: Payer: Self-pay | Admitting: Family Medicine

## 2016-07-19 VITALS — BP 128/64 | HR 65 | Temp 98.0°F | Wt 164.6 lb

## 2016-07-19 DIAGNOSIS — J069 Acute upper respiratory infection, unspecified: Secondary | ICD-10-CM

## 2016-07-19 DIAGNOSIS — B9789 Other viral agents as the cause of diseases classified elsewhere: Secondary | ICD-10-CM

## 2016-07-19 DIAGNOSIS — M545 Low back pain, unspecified: Secondary | ICD-10-CM

## 2016-07-19 MED ORDER — BENZONATATE 200 MG PO CAPS: 200.0000 mg | ORAL_CAPSULE | Freq: Two times a day (BID) | ORAL | 0 refills | Status: DC | PRN

## 2016-07-19 MED ORDER — TIZANIDINE HCL 4 MG PO TABS: 4.0000 mg | ORAL_TABLET | Freq: Three times a day (TID) | ORAL | 0 refills | Status: DC | PRN

## 2016-07-19 NOTE — Progress Notes (Signed)
BP 128/64 (Patient Position: Standing)   Pulse 65   Temp 98 F (36.7 C)   Wt 164 lb 9.6 oz (74.7 kg)   LMP  (Exact Date)   SpO2 98%   BMI 30.11 kg/m    Subjective:    Patient ID: Kristina Henson, female    DOB: 29-Dec-1955, 60 y.o.   MRN: [id number]  HPI: Kristina Henson is a 61 y.o. female  Chief Complaint  Patient presents with  . Back Pain   UPPER RESPIRATORY TRACT INFECTION Duration: 2-3 days Worst symptom: cough Fever: no Cough: yes Shortness of breath: no Wheezing: no Chest pain: no Chest tightness: no Chest congestion: no Nasal congestion: yes Runny nose: yes Post nasal drip: yes Sneezing: yes Sore throat: no Swollen glands: no Sinus pressure: no Headache: yes Face pain: no Toothache: no Ear pain: no  Ear pressure: no  Eyes red/itching:no Eye drainage/crusting: no  Vomiting: no Rash: no Fatigue: yes Sick contacts: yes Strep contacts: no  Context: better Recurrent sinusitis: no Relief with OTC cold/cough medications: no  Treatments attempted: pseudoephedrine   BACK PAIN- has been going to PT. Still having muscle spasms in her back. Notes that her flexeril doesn't seem to be helping much PT is helping. Would like to see neurology/neurosurgery, however neuro exam is normal.   Relevant past medical, surgical, family and social history reviewed and updated as indicated. Interim medical history since our last visit reviewed. Allergies and medications reviewed and updated.  Review of Systems  Constitutional: Negative.   Respiratory: Negative.   Cardiovascular: Negative.   Musculoskeletal: Positive for arthralgias, back pain and myalgias. Negative for gait problem, joint swelling, neck pain and neck stiffness.  Neurological: Negative.   Psychiatric/Behavioral: Negative.     Per HPI unless specifically indicated above     Objective:    BP 128/64 (Patient Position: Standing)   Pulse 65   Temp 98 F (36.7 C)   Wt 164 lb 9.6 oz (74.7 kg)    LMP  (Exact Date)   SpO2 98%   BMI 30.11 kg/m   Wt Readings from Last 3 Encounters:  07/19/16 164 lb 9.6 oz (74.7 kg)  06/14/16 168 lb 9.6 oz (76.5 kg)  05/25/16 168 lb (76.2 kg)    Physical Exam  Constitutional: She is oriented to person, place, and time. She appears well-developed and well-nourished. No distress.  HENT:  Head: Normocephalic and atraumatic.  Right Ear: Hearing and external ear normal.  Left Ear: Hearing and external ear normal.  Nose: Nose normal.  Mouth/Throat: Oropharynx is clear and moist. No oropharyngeal exudate.  Eyes: Conjunctivae, EOM and lids are normal. Pupils are equal, round, and reactive to light. Right eye exhibits no discharge. Left eye exhibits no discharge. No scleral icterus.  Neck: Normal range of motion. Neck supple. No JVD present. No tracheal deviation present. No thyromegaly present.  Cardiovascular: Normal rate, regular rhythm, normal heart sounds and intact distal pulses.  Exam reveals no gallop and no friction rub.   No murmur heard. Pulmonary/Chest: Effort normal and breath sounds normal. No stridor. No respiratory distress. She has no wheezes. She has no rales. She exhibits no tenderness.  Musculoskeletal: Normal range of motion.  Lymphadenopathy:    She has no cervical adenopathy.  Neurological: She is alert and oriented to person, place, and time.  Skin: Skin is warm, dry and intact. No rash noted. She is not diaphoretic. No erythema. No pallor.  Psychiatric: She has a normal mood and affect.  Her speech is normal and behavior is normal. Judgment and thought content normal. Cognition and memory are normal.  Nursing note and vitals reviewed. Back Exam:    Inspection:  Normal spinal curvature.  No deformity, ecchymosis, erythema, or lesions     Palpation:     Midline spinal tenderness: no      Paralumbar tenderness: yes R>L     Parathoracic tenderness: no      Buttocks tenderness: yesR>L     Range of Motion:      Flexion: Normal       Extension:Normal     Lateral bending:Normal    Rotation:Normal    Neuro Exam:Lower extremity DTRs normal & symmetric.  Strength and sensation intact.    Special Tests:      Straight leg raise:negative   Results for orders placed or performed in visit on 06/14/16  CBC with Differential  Result Value Ref Range   WBC 2.5 (L) 3.6 - 11.0 K/uL   RBC 3.60 (L) 3.80 - 5.20 MIL/uL   Hemoglobin 9.1 (L) 12.0 - 16.0 g/dL   HCT 28.0 (L) 35.0 - 47.0 %   MCV 77.7 (L) 80.0 - 100.0 fL   MCH 25.4 (L) 26.0 - 34.0 pg   MCHC 32.7 32.0 - 36.0 g/dL   RDW 18.0 (H) 11.5 - 14.5 %   Platelets 187 150 - 440 K/uL   Neutrophils Relative % 64 %   Neutro Abs 1.6 1.4 - 6.5 K/uL   Lymphocytes Relative 28 %   Lymphs Abs 0.7 (L) 1.0 - 3.6 K/uL   Monocytes Relative 6 %   Monocytes Absolute 0.2 0.2 - 0.9 K/uL   Eosinophils Relative 1 %   Eosinophils Absolute 0.0 0 - 0.7 K/uL   Basophils Relative 1 %   Basophils Absolute 0.0 0 - 0.1 K/uL      Assessment & Plan:   Problem List Items Addressed This Visit    None    Visit Diagnoses    Acute right-sided low back pain without sciatica    -  Primary   No need to see neurology/neurosurgery at this time given normal neuro exam. Will change muscle relaxer. Continue PT. Recheck at follow up in 2-3 weeks.    Relevant Medications   tiZANidine (ZANAFLEX) 4 MG tablet   Viral upper respiratory tract infection       Likely viral. No sign of bacterial infection. Symptomatic treatment. Tessalon perles. Call with any concerns.        Follow up plan: Return As scheduled.

## 2016-07-23 ENCOUNTER — Other Ambulatory Visit: Payer: Self-pay | Admitting: Family Medicine

## 2016-07-23 DIAGNOSIS — Z1231 Encounter for screening mammogram for malignant neoplasm of breast: Secondary | ICD-10-CM

## 2016-08-11 ENCOUNTER — Ambulatory Visit (INDEPENDENT_AMBULATORY_CARE_PROVIDER_SITE_OTHER): Payer: Medicare Other | Admitting: Family Medicine

## 2016-08-11 ENCOUNTER — Encounter: Payer: Self-pay | Admitting: Family Medicine

## 2016-08-11 VITALS — BP 151/69 | HR 61 | Temp 98.8°F | Ht 61.2 in | Wt 163.6 lb

## 2016-08-11 DIAGNOSIS — N390 Urinary tract infection, site not specified: Secondary | ICD-10-CM | POA: Diagnosis not present

## 2016-08-11 DIAGNOSIS — I482 Chronic atrial fibrillation, unspecified: Secondary | ICD-10-CM

## 2016-08-11 DIAGNOSIS — I10 Essential (primary) hypertension: Secondary | ICD-10-CM

## 2016-08-11 DIAGNOSIS — I129 Hypertensive chronic kidney disease with stage 1 through stage 4 chronic kidney disease, or unspecified chronic kidney disease: Secondary | ICD-10-CM

## 2016-08-11 DIAGNOSIS — D509 Iron deficiency anemia, unspecified: Secondary | ICD-10-CM | POA: Diagnosis not present

## 2016-08-11 DIAGNOSIS — Z0000 Encounter for general adult medical examination without abnormal findings: Secondary | ICD-10-CM

## 2016-08-11 DIAGNOSIS — E782 Mixed hyperlipidemia: Secondary | ICD-10-CM | POA: Diagnosis not present

## 2016-08-11 DIAGNOSIS — D1803 Hemangioma of intra-abdominal structures: Secondary | ICD-10-CM | POA: Diagnosis not present

## 2016-08-11 DIAGNOSIS — N3000 Acute cystitis without hematuria: Secondary | ICD-10-CM | POA: Diagnosis not present

## 2016-08-11 DIAGNOSIS — I6930 Unspecified sequelae of cerebral infarction: Secondary | ICD-10-CM | POA: Diagnosis not present

## 2016-08-11 MED ORDER — LISINOPRIL-HYDROCHLOROTHIAZIDE 20-25 MG PO TABS: 2.0000 | ORAL_TABLET | Freq: Every day | ORAL | 1 refills | Status: DC

## 2016-08-11 MED ORDER — NITROFURANTOIN MONOHYD MACRO 100 MG PO CAPS: 100.0000 mg | ORAL_CAPSULE | Freq: Two times a day (BID) | ORAL | 0 refills | Status: DC

## 2016-08-11 NOTE — Assessment & Plan Note (Signed)
Stable. Continue BP and cholesterol control. Medication management. Continue to monitor.

## 2016-08-11 NOTE — Assessment & Plan Note (Signed)
Rechecking labs today. Await results.  

## 2016-08-11 NOTE — Assessment & Plan Note (Signed)
Rechecking levels today. Await results. Treat as needed. Call with any concerns.  

## 2016-08-11 NOTE — Assessment & Plan Note (Signed)
Continue to follow with hematology. Rechecking levels today. Call with any concerns.

## 2016-08-11 NOTE — Assessment & Plan Note (Signed)
Stable. Continue current regimen. Continue to follow with cardiology. Call with any concerns.

## 2016-08-11 NOTE — Patient Instructions (Addendum)
Preventative Services:  Health Risk Assessment and Personalized Prevention Plan: Done today Bone Mass Measurements: N/A Breast Cancer Screening: up to date CVD Screening: done today Cervical Cancer Screening: N/A Colon Cancer Screening: up to date Depression Screening: done today Diabetes Screening: done today Glaucoma Screening: see your eye doctor Hepatitis B vaccine: N/A Hepatitis C screening: up to date HIV Screening: up to date Flu Vaccine: up to date Lung cancer Screening: N/A Obesity Screening: done today Pneumonia Vaccines (2): up to date STI Screening: N/A  Health Maintenance, Female Introduction Adopting a healthy lifestyle and getting preventive care can go a long way to promote health and wellness. Talk with your health care provider about what schedule of regular examinations is right for you. This is a good chance for you to check in with your provider about disease prevention and staying healthy. In between checkups, there are plenty of things you can do on your own. Experts have done a lot of research about which lifestyle changes and preventive measures are most likely to keep you healthy. Ask your health care provider for more information. Weight and diet Eat a healthy diet  Be sure to include plenty of vegetables, fruits, low-fat dairy products, and lean protein.  Do not eat a lot of foods high in solid fats, added sugars, or salt.  Get regular exercise. This is one of the most important things you can do for your health.  Most adults should exercise for at least 150 minutes each week. The exercise should increase your heart rate and make you sweat (moderate-intensity exercise).  Most adults should also do strengthening exercises at least twice a week. This is in addition to the moderate-intensity exercise. Maintain a healthy weight  Body mass index (BMI) is a measurement that can be used to identify possible weight problems. It estimates body fat based on  height and weight. Your health care provider can help determine your BMI and help you achieve or maintain a healthy weight.  For females 58 years of age and older:  A BMI below 18.5 is considered underweight.  A BMI of 18.5 to 24.9 is normal.  A BMI of 25 to 29.9 is considered overweight.  A BMI of 30 and above is considered obese. Watch levels of cholesterol and blood lipids  You should start having your blood tested for lipids and cholesterol at 58 years of age, then have this test every 5 years.  You may need to have your cholesterol levels checked more often if:  Your lipid or cholesterol levels are high.  You are older than 58 years of age.  You are at high risk for heart disease. Cancer screening Lung Cancer  Lung cancer screening is recommended for adults 50-62 years old who are at high risk for lung cancer because of a history of smoking.  A yearly low-dose CT scan of the lungs is recommended for people who:  Currently smoke.  Have quit within the past 15 years.  Have at least a 30-pack-year history of smoking. A pack year is smoking an average of one pack of cigarettes a day for 1 year.  Yearly screening should continue until it has been 15 years since you quit.  Yearly screening should stop if you develop a health problem that would prevent you from having lung cancer treatment. Breast Cancer  Practice breast self-awareness. This means understanding how your breasts normally appear and feel.  It also means doing regular breast self-exams. Let your health care provider know about  any changes, no matter how small.  If you are in your 20s or 30s, you should have a clinical breast exam (CBE) by a health care provider every 1-3 years as part of a regular health exam.  If you are 37 or older, have a CBE every year. Also consider having a breast X-ray (mammogram) every year.  If you have a family history of breast cancer, talk to your health care provider about  genetic screening.  If you are at high risk for breast cancer, talk to your health care provider about having an MRI and a mammogram every year.  Breast cancer gene (BRCA) assessment is recommended for women who have family members with BRCA-related cancers. BRCA-related cancers include:  Breast.  Ovarian.  Tubal.  Peritoneal cancers.  Results of the assessment will determine the need for genetic counseling and BRCA1 and BRCA2 testing. Cervical Cancer  Your health care provider may recommend that you be screened regularly for cancer of the pelvic organs (ovaries, uterus, and vagina). This screening involves a pelvic examination, including checking for microscopic changes to the surface of your cervix (Pap test). You may be encouraged to have this screening done every 3 years, beginning at age 26.  For women ages 49-65, health care providers may recommend pelvic exams and Pap testing every 3 years, or they may recommend the Pap and pelvic exam, combined with testing for human papilloma virus (HPV), every 5 years. Some types of HPV increase your risk of cervical cancer. Testing for HPV may also be done on women of any age with unclear Pap test results.  Other health care providers may not recommend any screening for nonpregnant women who are considered low risk for pelvic cancer and who do not have symptoms. Ask your health care provider if a screening pelvic exam is right for you.  If you have had past treatment for cervical cancer or a condition that could lead to cancer, you need Pap tests and screening for cancer for at least 20 years after your treatment. If Pap tests have been discontinued, your risk factors (such as having a new sexual partner) need to be reassessed to determine if screening should resume. Some women have medical problems that increase the chance of getting cervical cancer. In these cases, your health care provider may recommend more frequent screening and Pap  tests. Colorectal Cancer  This type of cancer can be detected and often prevented.  Routine colorectal cancer screening usually begins at 58 years of age and continues through 58 years of age.  Your health care provider may recommend screening at an earlier age if you have risk factors for colon cancer.  Your health care provider may also recommend using home test kits to check for hidden blood in the stool.  A small camera at the end of a tube can be used to examine your colon directly (sigmoidoscopy or colonoscopy). This is done to check for the earliest forms of colorectal cancer.  Routine screening usually begins at age 61.  Direct examination of the colon should be repeated every 5-10 years through 58 years of age. However, you may need to be screened more often if early forms of precancerous polyps or small growths are found. Skin Cancer  Check your skin from head to toe regularly.  Tell your health care provider about any new moles or changes in moles, especially if there is a change in a mole's shape or color.  Also tell your health care provider if you  have a mole that is larger than the size of a pencil eraser.  Always use sunscreen. Apply sunscreen liberally and repeatedly throughout the day.  Protect yourself by wearing long sleeves, pants, a wide-brimmed hat, and sunglasses whenever you are outside. Heart disease, diabetes, and high blood pressure  High blood pressure causes heart disease and increases the risk of stroke. High blood pressure is more likely to develop in:  People who have blood pressure in the high end of the normal range (130-139/85-89 mm Hg).  People who are overweight or obese.  People who are African American.  If you are 34-74 years of age, have your blood pressure checked every 3-5 years. If you are 49 years of age or older, have your blood pressure checked every year. You should have your blood pressure measured twice-once when you are at a  hospital or clinic, and once when you are not at a hospital or clinic. Record the average of the two measurements. To check your blood pressure when you are not at a hospital or clinic, you can use:  An automated blood pressure machine at a pharmacy.  A home blood pressure monitor.  If you are between 12 years and 48 years old, ask your health care provider if you should take aspirin to prevent strokes.  Have regular diabetes screenings. This involves taking a blood sample to check your fasting blood sugar level.  If you are at a normal weight and have a low risk for diabetes, have this test once every three years after 58 years of age.  If you are overweight and have a high risk for diabetes, consider being tested at a younger age or more often. Preventing infection Hepatitis B  If you have a higher risk for hepatitis B, you should be screened for this virus. You are considered at high risk for hepatitis B if:  You were born in a country where hepatitis B is common. Ask your health care provider which countries are considered high risk.  Your parents were born in a high-risk country, and you have not been immunized against hepatitis B (hepatitis B vaccine).  You have HIV or AIDS.  You use needles to inject street drugs.  You live with someone who has hepatitis B.  You have had sex with someone who has hepatitis B.  You get hemodialysis treatment.  You take certain medicines for conditions, including cancer, organ transplantation, and autoimmune conditions. Hepatitis C  Blood testing is recommended for:  Everyone born from [birth year] through 1963.  Anyone with known risk factors for hepatitis C. Sexually transmitted infections (STIs)  You should be screened for sexually transmitted infections (STIs) including gonorrhea and chlamydia if:  You are sexually active and are younger than 58 years of age.  You are older than 58 years of age and your health care provider tells you  that you are at risk for this type of infection.  Your sexual activity has changed since you were last screened and you are at an increased risk for chlamydia or gonorrhea. Ask your health care provider if you are at risk.  If you do not have HIV, but are at risk, it may be recommended that you take a prescription medicine daily to prevent HIV infection. This is called pre-exposure prophylaxis (PrEP). You are considered at risk if:  You are sexually active and do not regularly use condoms or know the HIV status of your partner(s).  You take drugs by injection.  You are  sexually active with a partner who has HIV. Talk with your health care provider about whether you are at high risk of being infected with HIV. If you choose to begin PrEP, you should first be tested for HIV. You should then be tested every 3 months for as long as you are taking PrEP. Pregnancy  If you are premenopausal and you may become pregnant, ask your health care provider about preconception counseling.  If you may become pregnant, take 400 to 800 micrograms (mcg) of folic acid every day.  If you want to prevent pregnancy, talk to your health care provider about birth control (contraception). Osteoporosis and menopause  Osteoporosis is a disease in which the bones lose minerals and strength with aging. This can result in serious bone fractures. Your risk for osteoporosis can be identified using a bone density scan.  If you are 98 years of age or older, or if you are at risk for osteoporosis and fractures, ask your health care provider if you should be screened.  Ask your health care provider whether you should take a calcium or vitamin D supplement to lower your risk for osteoporosis.  Menopause may have certain physical symptoms and risks.  Hormone replacement therapy may reduce some of these symptoms and risks. Talk to your health care provider about whether hormone replacement therapy is right for you. Follow  these instructions at home:  Schedule regular health, dental, and eye exams.  Stay current with your immunizations.  Do not use any tobacco products including cigarettes, chewing tobacco, or electronic cigarettes.  If you are pregnant, do not drink alcohol.  If you are breastfeeding, limit how much and how often you drink alcohol.  Limit alcohol intake to no more than 1 drink per day for nonpregnant women. One drink equals 12 ounces of beer, 5 ounces of wine, or 1 ounces of hard liquor.  Do not use street drugs.  Do not share needles.  Ask your health care provider for help if you need support or information about quitting drugs.  Tell your health care provider if you often feel depressed.  Tell your health care provider if you have ever been abused or do not feel safe at home. This information is not intended to replace advice given to you by your health care provider. Make sure you discuss any questions you have with your health care provider. Document Released: 03/29/2011 Document Revised: 02/19/2016 Document Reviewed: 06/17/2015  2017 [employer]  Health Maintenance for Postmenopausal Women Introduction Menopause is a normal process in which your reproductive ability comes to an end. This process happens gradually over a span of months to years, usually between the ages of 49 and 24. Menopause is complete when you have missed 12 consecutive menstrual periods. It is important to talk with your health care provider about some of the most common conditions that affect postmenopausal women, such as heart disease, cancer, and bone loss (osteoporosis). Adopting a healthy lifestyle and getting preventive care can help to promote your health and wellness. Those actions can also lower your chances of developing some of these common conditions. What should I know about menopause? During menopause, you may experience a number of symptoms, such as:  Moderate-to-severe hot flashes.  Night  sweats.  Decrease in sex drive.  Mood swings.  Headaches.  Tiredness.  Irritability.  Memory problems.  Insomnia. Choosing to treat or not to treat menopausal changes is an individual decision that you make with your health care provider. What should I  know about hormone replacement therapy and supplements? Hormone therapy products are effective for treating symptoms that are associated with menopause, such as hot flashes and night sweats. Hormone replacement carries certain risks, especially as you become older. If you are thinking about using estrogen or estrogen with progestin treatments, discuss the benefits and risks with your health care provider. What should I know about heart disease and stroke? Heart disease, heart attack, and stroke become more likely as you age. This may be due, in part, to the hormonal changes that your body experiences during menopause. These can affect how your body processes dietary fats, triglycerides, and cholesterol. Heart attack and stroke are both medical emergencies. There are many things that you can do to help prevent heart disease and stroke:  Have your blood pressure checked at least every 1-2 years. High blood pressure causes heart disease and increases the risk of stroke.  If you are 22-4 years old, ask your health care provider if you should take aspirin to prevent a heart attack or a stroke.  Do not use any tobacco products, including cigarettes, chewing tobacco, or electronic cigarettes. If you need help quitting, ask your health care provider.  It is important to eat a healthy diet and maintain a healthy weight.  Be sure to include plenty of vegetables, fruits, low-fat dairy products, and lean protein.  Avoid eating foods that are high in solid fats, added sugars, or salt (sodium).  Get regular exercise. This is one of the most important things that you can do for your health.  Try to exercise for at least 150 minutes each week. The  type of exercise that you do should increase your heart rate and make you sweat. This is known as moderate-intensity exercise.  Try to do strengthening exercises at least twice each week. Do these in addition to the moderate-intensity exercise.  Know your numbers.Ask your health care provider to check your cholesterol and your blood glucose. Continue to have your blood tested as directed by your health care provider. What should I know about cancer screening? There are several types of cancer. Take the following steps to reduce your risk and to catch any cancer development as early as possible. Breast Cancer  Practice breast self-awareness.  This means understanding how your breasts normally appear and feel.  It also means doing regular breast self-exams. Let your health care provider know about any changes, no matter how small.  If you are 62 or older, have a clinician do a breast exam (clinical breast exam or CBE) every year. Depending on your age, family history, and medical history, it may be recommended that you also have a yearly breast X-ray (mammogram).  If you have a family history of breast cancer, talk with your health care provider about genetic screening.  If you are at high risk for breast cancer, talk with your health care provider about having an MRI and a mammogram every year.  Breast cancer (BRCA) gene test is recommended for women who have family members with BRCA-related cancers. Results of the assessment will determine the need for genetic counseling and BRCA1 and for BRCA2 testing. BRCA-related cancers include these types:  Breast. This occurs in males or females.  Ovarian.  Tubal. This may also be called fallopian tube cancer.  Cancer of the abdominal or pelvic lining (peritoneal cancer).  Prostate.  Pancreatic. Cervical, Uterine, and Ovarian Cancer  Your health care provider may recommend that you be screened regularly for cancer of the pelvic organs.  These  include your ovaries, uterus, and vagina. This screening involves a pelvic exam, which includes checking for microscopic changes to the surface of your cervix (Pap test).  For women ages 21-65, health care providers may recommend a pelvic exam and a Pap test every three years. For women ages 37-65, they may recommend the Pap test and pelvic exam, combined with testing for human papilloma virus (HPV), every five years. Some types of HPV increase your risk of cervical cancer. Testing for HPV may also be done on women of any age who have unclear Pap test results.  Other health care providers may not recommend any screening for nonpregnant women who are considered low risk for pelvic cancer and have no symptoms. Ask your health care provider if a screening pelvic exam is right for you.  If you have had past treatment for cervical cancer or a condition that could lead to cancer, you need Pap tests and screening for cancer for at least 20 years after your treatment. If Pap tests have been discontinued for you, your risk factors (such as having a new sexual partner) need to be reassessed to determine if you should start having screenings again. Some women have medical problems that increase the chance of getting cervical cancer. In these cases, your health care provider may recommend that you have screening and Pap tests more often.  If you have a family history of uterine cancer or ovarian cancer, talk with your health care provider about genetic screening.  If you have vaginal bleeding after reaching menopause, tell your health care provider.  There are currently no reliable tests available to screen for ovarian cancer. Lung Cancer  Lung cancer screening is recommended for adults 75-76 years old who are at high risk for lung cancer because of a history of smoking. A yearly low-dose CT scan of the lungs is recommended if you:  Currently smoke.  Have a history of at least 30 pack-years of smoking and you  currently smoke or have quit within the past 15 years. A pack-year is smoking an average of one pack of cigarettes per day for one year. Yearly screening should:  Continue until it has been 15 years since you quit.  Stop if you develop a health problem that would prevent you from having lung cancer treatment. Colorectal Cancer  This type of cancer can be detected and can often be prevented.  Routine colorectal cancer screening usually begins at age 42 and continues through age 67.  If you have risk factors for colon cancer, your health care provider may recommend that you be screened at an earlier age.  If you have a family history of colorectal cancer, talk with your health care provider about genetic screening.  Your health care provider may also recommend using home test kits to check for hidden blood in your stool.  A small camera at the end of a tube can be used to examine your colon directly (sigmoidoscopy or colonoscopy). This is done to check for the earliest forms of colorectal cancer.  Direct examination of the colon should be repeated every 5-10 years until age 36. However, if early forms of precancerous polyps or small growths are found or if you have a family history or genetic risk for colorectal cancer, you may need to be screened more often. Skin Cancer  Check your skin from head to toe regularly.  Monitor any moles. Be sure to tell your health care provider:  About any new moles or changes  in moles, especially if there is a change in a mole's shape or color.  If you have a mole that is larger than the size of a pencil eraser.  If any of your family members has a history of skin cancer, especially at a young age, talk with your health care provider about genetic screening.  Always use sunscreen. Apply sunscreen liberally and repeatedly throughout the day.  Whenever you are outside, protect yourself by wearing long sleeves, pants, a wide-brimmed hat, and  sunglasses. What should I know about osteoporosis? Osteoporosis is a condition in which bone destruction happens more quickly than new bone creation. After menopause, you may be at an increased risk for osteoporosis. To help prevent osteoporosis or the bone fractures that can happen because of osteoporosis, the following is recommended:  If you are 90-38 years old, get at least 1,000 mg of calcium and at least 600 mg of vitamin D per day.  If you are older than age 18 but younger than age 36, get at least 1,200 mg of calcium and at least 600 mg of vitamin D per day.  If you are older than age 54, get at least 1,200 mg of calcium and at least 800 mg of vitamin D per day. Smoking and excessive alcohol intake increase the risk of osteoporosis. Eat foods that are rich in calcium and vitamin D, and do weight-bearing exercises several times each week as directed by your health care provider. What should I know about how menopause affects my mental health? Depression may occur at any age, but it is more common as you become older. Common symptoms of depression include:  Low or sad mood.  Changes in sleep patterns.  Changes in appetite or eating patterns.  Feeling an overall lack of motivation or enjoyment of activities that you previously enjoyed.  Frequent crying spells. Talk with your health care provider if you think that you are experiencing depression. What should I know about immunizations? It is important that you get and maintain your immunizations. These include:  Tetanus, diphtheria, and pertussis (Tdap) booster vaccine.  Influenza every year before the flu season begins.  Pneumonia vaccine.  Shingles vaccine. Your health care provider may also recommend other immunizations. This information is not intended to replace advice given to you by your health care provider. Make sure you discuss any questions you have with your health care provider. Document Released: 11/05/2005  Document Revised: 04/02/2016 Document Reviewed: 06/17/2015  2017 [employer] Advance Directive Advance directives are the legal documents that allow you to make choices about your health care and medical treatment if you cannot speak for yourself. Advance directives are a way for you to communicate your wishes to family, friends, and health care providers. The specified people can then convey your decisions about end-of-life care to avoid confusion if you should become unable to communicate. Ideally, the process of discussing and writing advance directives should happen over time rather than making decisions all at once. Advance directives can be modified as your situation changes, and you can change your mind at any time, even after you have signed the advance directives. Each state has its own laws regarding advance directives. You may want to check with your health care provider, attorney, or state representative about the law in your state. Below are some examples of advance directives. HEALTH CARE PROXY AND DURABLE POWER OF ATTORNEY FOR HEALTH CARE A health care proxy is a person (agent) appointed to make medical decisions for you if you  cannot. Generally, people choose someone they know well and trust to represent their preferences when they can no longer do so. You should be sure to ask this person for agreement to act as your agent. An agent may have to exercise judgment in the event of a medical decision for which your wishes are not known. A durable power of attorney for health care is a legal document that names your health care proxy. Depending on the laws in your state, after the document is written, it may also need to be:  Signed.  Notarized.  Dated.  Copied.  Witnessed.  Incorporated into your medical record. You may also want to appoint someone to manage your financial affairs if you cannot. This is called a durable power of attorney for finances. It is a separate legal document  from the durable power of attorney for health care. You may choose the same person or someone different from your health care proxy to act as your agent in financial matters. LIVING WILL A living will is a set of instructions documenting your wishes about medical care when you cannot care for yourself. It is used if you become:  Terminally ill.  Incapacitated.  Unable to communicate.  Unable to make decisions. Items to consider in your living will include:  The use or non-use of life-sustaining equipment, such as dialysis machines and breathing machines (ventilators).  A do not resuscitate (DNR) order, which is the instruction not to use cardiopulmonary resuscitation (CPR) if breathing or heartbeat stops.  Tube feeding.  Withholding of food and fluids.  Comfort (palliative) care when the goal becomes comfort rather than a cure.  Organ and tissue donation. A living will does not give instructions about distribution of your money and property if you should pass away. It is advisable to seek the expert advice of a lawyer in drawing up a will regarding your possessions. Decisions about taxes, beneficiaries, and asset distribution will be legally binding. This process can relieve your family and friends of any burdens surrounding disputes or questions that may come up about the allocation of your assets. DO NOT RESUSCITATE (DNR) A do not resuscitate (DNR) order is a request to not have CPR in the event that your heart stops beating or you stop breathing. Unless given other instructions, a health care provider will try to help any patient whose heart has stopped or who has stopped breathing.  This information is not intended to replace advice given to you by your health care provider. Make sure you discuss any questions you have with your health care provider. Document Released: 12/21/2007 Document Revised: 01/05/2016 Document Reviewed: 01/31/2013 Elsevier Interactive Patient Education  2017  [employer].

## 2016-08-11 NOTE — Assessment & Plan Note (Signed)
Elevated today. Will work on [employer] and monitor at home. Call with any concerns.

## 2016-08-11 NOTE — Progress Notes (Signed)
BP (!) 151/69 (BP Location: Left Arm, Patient Position: Sitting, Cuff Size: Normal)   Pulse 61   Temp 98.8 F (37.1 C)   Ht 5' 1.2" (1.554 m)   Wt 163 lb 9.6 oz (74.2 kg)   LMP  (Exact Date)   SpO2 95%   BMI 30.71 kg/m    Subjective:    Patient ID: Kristina Henson, female    DOB: 06/13/1956, 60 y.o.   MRN: [id number]  HPI: Kristina Henson is a 60 y.o. female presenting on 08/11/2016 for comprehensive medical examination. Current medical complaints include:  HYPERTENSION / HYPERLIPIDEMIA Satisfied with current treatment? yes Duration of hypertension: chronic BP monitoring frequency: not checking BP medication side effects: no Duration of hyperlipidemia: chronic Cholesterol medication side effects: Not on anything Cholesterol supplements: none Medication compliance: excellent compliance Aspirin: yes Recent stressors: no Recurrent headaches: no Visual changes: no Palpitations: no Dyspnea: no Chest pain: no Lower extremity edema: no Dizzy/lightheaded: no  URINARY SYMPTOMS Duration: 3-4 days Dysuria: no Urinary frequency: yes Urgency: yes Small volume voids: no Symptom severity: moderate Urinary incontinence: no Foul odor: yes Hematuria: no Abdominal pain: no Back pain: no Suprapubic pain/pressure: no Flank pain: no Fever:  no Vomiting: no Relief with cranberry juice: no Relief with pyridium: no Status: stable Previous urinary tract infection: yes Recurrent urinary tract infection: yes History of sexually transmitted disease: no Vaginal discharge: no Treatments attempted: increasing fluids   She currently lives with: alone Menopausal Symptoms: no  Functional Status Survey: Is the patient deaf or have difficulty hearing?: No Does the patient have difficulty seeing, even when wearing glasses/contacts?: No Does the patient have difficulty concentrating, remembering, or making decisions?: No Does the patient have difficulty walking or climbing stairs?:  No Does the patient have difficulty dressing or bathing?: No Does the patient have difficulty doing errands alone such as visiting a doctor's office or shopping?: No  Fall Risk  08/11/2016  Falls in the past year? No    Depression Screen Depression screen PHQ 2/9 08/11/2016  Decreased Interest 0  Down, Depressed, Hopeless 0  PHQ - 2 Score 0   Advanced Directives Does patient have a HCPOA?    no Does patient have a living will or MOST form?  no  Past Medical History:  Past Medical History:  Diagnosis Date  . Anemia   . Arthritis    BACK-LUMBAR  . Atrial fibrillation ([place])   . Coronary artery disease   . Dysrhythmia   . Hypertension   . Mass in neck    lt side  . Pneumonia 2016  . Stroke ([place]) 2012  . Stroke [facility]) 2012    Surgical History:  Past Surgical History:  Procedure Laterality Date  . ABDOMINAL HYSTERECTOMY     Total  . cardiac catherization    . COLONOSCOPY WITH PROPOFOL N/A 11/18/2015   Procedure: COLONOSCOPY WITH PROPOFOL;  Surgeon: Lucilla Lame, MD;  Location: [facility] ENDOSCOPY;  Service: Endoscopy;  Laterality: N/A;  . CORONARY ANGIOPLASTY    . ESOPHAGOGASTRODUODENOSCOPY (EGD) WITH PROPOFOL N/A 04/23/2016   Procedure: ESOPHAGOGASTRODUODENOSCOPY (EGD) WITH PROPOFOL;  Surgeon: Lucilla Lame, MD;  Location: [facility];  Service: Endoscopy;  Laterality: N/A;  small bowel bx gastric antrum bx  . throat biopsy      Medications:  Current Outpatient Prescriptions on File Prior to Visit  Medication Sig  . amiodarone (PACERONE) 200 MG tablet Take 200 mg by mouth every morning.   Marland Kitchen aspirin EC 81 MG tablet Take 81  mg by mouth daily.   Marland Kitchen CRANBERRY PO Take 1 tablet by mouth daily.  . ferrous sulfate 325 (65 FE) MG EC tablet Take 1 tablet (325 mg total) by mouth 3 (three) times daily with meals.  . hydrALAZINE (APRESOLINE) 50 MG tablet Take 50 mg by mouth 2 (two) times daily.  . Multiple Vitamin (MULTIVITAMIN) tablet Take 1 tablet by mouth daily. Reported on  04/01/2016  . naproxen (NAPROSYN) 500 MG tablet Take 1 tablet (500 mg total) by mouth 2 (two) times daily with a meal.  . tiZANidine (ZANAFLEX) 4 MG tablet Take 1 tablet (4 mg total) by mouth every 8 (eight) hours as needed for muscle spasms. This medicine might make you sleepy   No current facility-administered medications on file prior to visit.     Allergies:  Allergies  Allergen Reactions  . Amoxicillin Other (See Comments)    Joint pains    Social History:  Social History   Social History  . Marital status: Divorced    Spouse name: N/A  . Number of children: N/A  . Years of education: N/A   Occupational History  . Not on file.   Social History Main Topics  . Smoking status: Never Smoker  . Smokeless tobacco: Never Used  . Alcohol use No  . Drug use: No  . Sexual activity: Yes   Other Topics Concern  . Not on file   Social History Narrative  . No narrative on file   History  Smoking Status  . Never Smoker  Smokeless Tobacco  . Never Used   History  Alcohol Use No    Family History:  Family History  Problem Relation Age of Onset  . Arthritis Mother   . Cancer Mother   . Hypertension Sister   . Asthma Brother   . Breast cancer Neg Hx     Past medical history, surgical history, medications, allergies, family history and social history reviewed with patient today and changes made to appropriate areas of the chart.   Review of Systems  Constitutional: Negative.   HENT: Negative.   Eyes: Negative.   Respiratory: Negative.   Cardiovascular: Negative.   Gastrointestinal: Negative.   Genitourinary: Positive for frequency and urgency. Negative for dysuria, flank pain and hematuria.  Musculoskeletal: Positive for back pain. Negative for falls, joint pain, myalgias and neck pain.  Skin: Negative.   Neurological: Negative.   Endo/Heme/Allergies: Positive for polydipsia. Negative for environmental allergies. Does not bruise/bleed easily.    Psychiatric/Behavioral: Negative.     All other ROS negative except what is listed above and in the HPI.      Objective:    BP (!) 151/69 (BP Location: Left Arm, Patient Position: Sitting, Cuff Size: Normal)   Pulse 61   Temp 98.8 F (37.1 C)   Ht 5' 1.2" (1.554 m)   Wt 163 lb 9.6 oz (74.2 kg)   LMP  (Exact Date)   SpO2 95%   BMI 30.71 kg/m   Wt Readings from Last 3 Encounters:  08/11/16 163 lb 9.6 oz (74.2 kg)  07/19/16 164 lb 9.6 oz (74.7 kg)  06/14/16 168 lb 9.6 oz (76.5 kg)     Hearing Screening   125Hz  250Hz  500Hz  1000Hz  2000Hz  3000Hz  4000Hz  6000Hz  8000Hz   Right ear:   20 20 20  20     Left ear:   20 20 20  20       Visual Acuity Screening   Right eye Left eye Both eyes  Without  correction: 20/30 20/30 20/30   With correction:       Physical Exam  Constitutional: She is oriented to person, place, and time. She appears well-developed and well-nourished. No distress.  HENT:  Head: Normocephalic and atraumatic.  Right Ear: Hearing, tympanic membrane, external ear and ear canal normal.  Left Ear: Hearing, tympanic membrane, external ear and ear canal normal.  Nose: Nose normal.  Mouth/Throat: Oropharynx is clear and moist. No oropharyngeal exudate.  Eyes: Conjunctivae, EOM and lids are normal. Pupils are equal, round, and reactive to light. Right eye exhibits no discharge. Left eye exhibits no discharge. No scleral icterus.  Neck: Normal range of motion. Neck supple. No JVD present. No tracheal deviation present. No thyromegaly present.  Cardiovascular: Normal rate, normal heart sounds and intact distal pulses.  An irregular rhythm present. Exam reveals no gallop and no friction rub.   No murmur heard. Pulmonary/Chest: Effort normal and breath sounds normal. No stridor. No respiratory distress. She has no wheezes. She has no rales. She exhibits no tenderness.  Abdominal: Soft. Bowel sounds are normal. She exhibits no distension and no mass. There is no tenderness. There  is no rebound and no guarding.  Genitourinary:  Genitourinary Comments: Breast and GYN exams deferred with shared decision making.   Musculoskeletal: Normal range of motion. She exhibits no edema, tenderness or deformity.  Lymphadenopathy:    She has no cervical adenopathy.  Neurological: She is alert and oriented to person, place, and time. She has normal reflexes. She displays normal reflexes. No cranial nerve deficit. She exhibits normal muscle tone. Coordination normal.  Skin: Skin is warm, dry and intact. No rash noted. She is not diaphoretic. No erythema. No pallor.  Psychiatric: She has a normal mood and affect. Her speech is normal and behavior is normal. Judgment and thought content normal. Cognition and memory are normal.  Nursing note and vitals reviewed.   6CIT Screen 08/11/2016  What Year? 0 points  What month? 0 points  What time? 0 points  Count back from 20 2 points  Months in reverse 0 points  Repeat phrase 2 points  Total Score 4    Results for orders placed or performed in visit on 08/11/16  Microscopic Examination  Result Value Ref Range   WBC, UA 11-30 (A) 0 - 5 /hpf   RBC, UA None seen 0 - 2 /hpf   Epithelial Cells (non renal) 0-10 0 - 10 /hpf   Bacteria, UA Many (A) None seen/Few  Microalbumin, Urine Waived  Result Value Ref Range   Microalb, Ur Waived 30 (H) 0 - 19 mg/L   Creatinine, Urine Waived 200 10 - 300 mg/dL   Microalb/Creat Ratio <30 <30 mg/g  UA/M w/rflx Culture, Routine  Result Value Ref Range   Specific Gravity, UA 1.015 1.005 - 1.030   pH, UA 5.5 5.0 - 7.5   Color, UA Yellow Yellow   Appearance Ur Cloudy (A) Clear   Leukocytes, UA 1+ (A) Negative   Protein, UA Negative Negative/Trace   Glucose, UA Negative Negative   Ketones, UA Negative Negative   RBC, UA Negative Negative   Bilirubin, UA Negative Negative   Urobilinogen, Ur 0.2 0.2 - 1.0 mg/dL   Nitrite, UA Positive (A) Negative   Microscopic Examination See below:    Urinalysis  Reflex Comment   Urine Culture, Routine  Result Value Ref Range   Urine Culture, Routine WILL FOLLOW       Assessment & Plan:   Problem List Items  Addressed This Visit      Cardiovascular and Mediastinum   Atrial fibrillation ([place])    Stable. Continue current regimen. Continue to follow with cardiology. Call with any concerns.       Relevant Medications   lisinopril-hydrochlorothiazide (PRINZIDE,ZESTORETIC) 20-25 MG tablet   Liver hemangioma    Rechecking labs today. Await results.       Relevant Medications   lisinopril-hydrochlorothiazide (PRINZIDE,ZESTORETIC) 20-25 MG tablet   Other Relevant Orders   CBC with Differential/Platelet   Comprehensive metabolic panel     Genitourinary   Benign hypertensive renal disease    Elevated today. Will work on [employer] and monitor at home. Call with any concerns.         Other   HLD (hyperlipidemia)    Rechecking levels today. Await results. Treat as needed. Call with any concerns.       Relevant Medications   lisinopril-hydrochlorothiazide (PRINZIDE,ZESTORETIC) 20-25 MG tablet   Other Relevant Orders   Comprehensive metabolic panel   Lipid Panel w/o Chol/HDL Ratio   History of CVA with residual deficit    Stable. Continue BP and cholesterol control. Medication management. Continue to monitor.       Iron deficiency anemia    Continue to follow with hematology. Rechecking levels today. Call with any concerns.       Relevant Orders   CBC with Differential/Platelet   Comprehensive metabolic panel    Other Visit Diagnoses    Medicare annual wellness visit, subsequent    -  Primary   Preventative care discussed. See below. Continue diet and exercise. Continue to monitor.    Acute cystitis without hematuria       Will treat with nitrofurantoin. Return for recheck on urine in about 10 days to confirm resolution.   Relevant Orders   UA/M w/rflx Culture, Routine       Preventative Services:  Health Risk Assessment and  Personalized Prevention Plan: Done today Bone Mass Measurements: N/A Breast Cancer Screening: up to date CVD Screening: done today Cervical Cancer Screening: N/A Colon Cancer Screening: up to date Depression Screening: done today Diabetes Screening: done today Glaucoma Screening: see your eye doctor Hepatitis B vaccine: N/A Hepatitis C screening: up to date HIV Screening: up to date Flu Vaccine: up to date Lung cancer Screening: N/A Obesity Screening: done today Pneumonia Vaccines (2): up to date STI Screening: N/A  Follow up plan: Return in about 6 months (around 02/08/2017) for HTN/Chol follow up.   LABORATORY TESTING:  - Pap smear: not applicable  IMMUNIZATIONS:   - Tdap: Tetanus vaccination status reviewed: last tetanus booster within 10 years. - Influenza: Up to date - Pneumovax: Up to date - Prevnar: Not applicable - Zostavax vaccine: Not applicable  SCREENING: -Mammogram: Up to date  - Colonoscopy: Up to date  - Bone Density: Not applicable   PATIENT COUNSELING:   Advised to take 1 mg of folate supplement per day if capable of pregnancy.   Sexuality: Discussed sexually transmitted diseases, partner selection, use of condoms, avoidance of unintended pregnancy  and contraceptive alternatives.   Advised to avoid cigarette smoking.  I discussed with the patient that most people either abstain from alcohol or drink within safe limits (<=14/week and <=4 drinks/occasion for males, <=7/weeks and <= 3 drinks/occasion for females) and that the risk for alcohol disorders and other health effects rises proportionally with the number of drinks per week and how often a drinker exceeds daily limits.  Discussed cessation/primary prevention of drug use  and availability of treatment for abuse.   Diet: Encouraged to adjust caloric intake to maintain  or achieve ideal body weight, to reduce intake of dietary saturated fat and total fat, to limit sodium intake by avoiding high sodium  foods and not adding table salt, and to maintain adequate dietary potassium and calcium preferably from fresh fruits, vegetables, and low-fat dairy products.    stressed the importance of regular exercise  Injury prevention: Discussed safety belts, safety helmets, smoke detector, smoking near bedding or upholstery.   Dental health: Discussed importance of regular tooth brushing, flossing, and dental visits.    NEXT PREVENTATIVE PHYSICAL DUE IN 1 YEAR. Return in about 6 months (around 02/08/2017) for HTN/Chol follow up.

## 2016-08-12 ENCOUNTER — Encounter: Payer: Self-pay | Admitting: Family Medicine

## 2016-08-12 LAB — CBC WITH DIFFERENTIAL/PLATELET
BASOS ABS: 0 10*3/uL (ref 0.0–0.2)
BASOS: 0 %
EOS (ABSOLUTE): 0 10*3/uL (ref 0.0–0.4)
Eos: 0 %
Hematocrit: 30.3 % — ABNORMAL LOW (ref 34.0–46.6)
Hemoglobin: 9.4 g/dL — ABNORMAL LOW (ref 11.1–15.9)
Immature Grans (Abs): 0 10*3/uL (ref 0.0–0.1)
Immature Granulocytes: 1 %
LYMPHS ABS: 0.7 10*3/uL (ref 0.7–3.1)
Lymphs: 23 %
MCH: 24.4 pg — AB (ref 26.6–33.0)
MCHC: 31 g/dL — AB (ref 31.5–35.7)
MCV: 79 fL (ref 79–97)
MONOS ABS: 0.2 10*3/uL (ref 0.1–0.9)
Monocytes: 7 %
NEUTROS ABS: 2.1 10*3/uL (ref 1.4–7.0)
Neutrophils: 69 %
PLATELETS: 197 10*3/uL (ref 150–379)
RBC: 3.86 x10E6/uL (ref 3.77–5.28)
RDW: 19.4 % — AB (ref 12.3–15.4)
WBC: 3.1 10*3/uL — ABNORMAL LOW (ref 3.4–10.8)

## 2016-08-12 LAB — COMPREHENSIVE METABOLIC PANEL
A/G RATIO: 1.3 (ref 1.2–2.2)
ALK PHOS: 83 IU/L (ref 39–117)
ALT: 15 IU/L (ref 0–32)
AST: 16 IU/L (ref 0–40)
Albumin: 3.9 g/dL (ref 3.5–5.5)
BILIRUBIN TOTAL: 0.4 mg/dL (ref 0.0–1.2)
BUN/Creatinine Ratio: 23 (ref 9–23)
BUN: 21 mg/dL (ref 6–24)
CHLORIDE: 100 mmol/L (ref 96–106)
CO2: 27 mmol/L (ref 18–29)
Calcium: 9.3 mg/dL (ref 8.7–10.2)
Creatinine, Ser: 0.91 mg/dL (ref 0.57–1.00)
GFR calc non Af Amer: 70 mL/min/{1.73_m2} (ref >59)
GFR, EST AFRICAN AMERICAN: 80 mL/min/{1.73_m2} (ref >59)
GLUCOSE: 115 mg/dL — AB (ref 65–99)
Globulin, Total: 3 g/dL (ref 1.5–4.5)
POTASSIUM: 4 mmol/L (ref 3.5–5.2)
Sodium: 141 mmol/L (ref 134–144)
TOTAL PROTEIN: 6.9 g/dL (ref 6.0–8.5)

## 2016-08-12 LAB — TSH: TSH: 1.78 u[IU]/mL (ref 0.450–4.500)

## 2016-08-12 LAB — LIPID PANEL W/O CHOL/HDL RATIO
CHOLESTEROL TOTAL: 167 mg/dL (ref 100–199)
HDL: 41 mg/dL (ref >39)
LDL Calculated: 100 mg/dL — ABNORMAL HIGH (ref 0–99)
Triglycerides: 128 mg/dL (ref 0–149)
VLDL Cholesterol Cal: 26 mg/dL (ref 5–40)

## 2016-08-16 LAB — MICROSCOPIC EXAMINATION: RBC MICROSCOPIC, UA: NONE SEEN /HPF (ref 0–?)

## 2016-08-16 LAB — UA/M W/RFLX CULTURE, ROUTINE
Bilirubin, UA: NEGATIVE
GLUCOSE, UA: NEGATIVE
Ketones, UA: NEGATIVE
NITRITE UA: POSITIVE — AB
PROTEIN UA: NEGATIVE
RBC UA: NEGATIVE
SPEC GRAV UA: 1.015 (ref 1.005–1.030)
UUROB: 0.2 mg/dL (ref 0.2–1.0)
pH, UA: 5.5 (ref 5.0–7.5)

## 2016-08-16 LAB — URINE CULTURE, REFLEX

## 2016-08-16 LAB — MICROALBUMIN, URINE WAIVED
Creatinine, Urine Waived: 200 mg/dL (ref 10–300)
Microalb, Ur Waived: 30 mg/L — ABNORMAL HIGH (ref 0–19)

## 2016-08-23 ENCOUNTER — Ambulatory Visit (INDEPENDENT_AMBULATORY_CARE_PROVIDER_SITE_OTHER): Payer: Medicare Other | Admitting: Family Medicine

## 2016-08-23 ENCOUNTER — Encounter: Payer: Self-pay | Admitting: Family Medicine

## 2016-08-23 ENCOUNTER — Other Ambulatory Visit: Payer: Self-pay | Admitting: Family Medicine

## 2016-08-23 DIAGNOSIS — N3000 Acute cystitis without hematuria: Secondary | ICD-10-CM

## 2016-08-23 NOTE — Progress Notes (Signed)
BP (!) 117/59 (BP Location: Left Arm, Patient Position: Sitting, Cuff Size: Normal)   Pulse (!) 49   Temp 98.4 F (36.9 C)   Wt 163 lb 4.8 oz (74.1 kg)   LMP  (Exact Date)   SpO2 98%   BMI 30.65 kg/m    Subjective:    Patient ID: Kristina Henson, female    DOB: 11-20-1955, 60 y.o.   MRN: [id number]  HPI: Kristina Henson is a 60 y.o. female  Chief Complaint  Patient presents with  . Urinary Tract Infection    Patient is here to have her urine rechecked   URINARY SYMPTOMS- finished antibiotic. Feeling well. No concerns.  Dysuria: no Urinary frequency: no Urgency: no Small volume voids: no Symptom severity: mild Urinary incontinence: no Foul odor: no Hematuria: no Abdominal pain: no Back pain: no Suprapubic pain/pressure: no Flank pain: no Fever:  no Vomiting: no Treatments attempted: antibiotics   Relevant past medical, surgical, family and social history reviewed and updated as indicated. Interim medical history since our last visit reviewed. Allergies and medications reviewed and updated.  Review of Systems  Constitutional: Negative.   Respiratory: Negative.   Cardiovascular: Negative.   Gastrointestinal: Negative.   Genitourinary: Negative.   Psychiatric/Behavioral: Negative.     Per HPI unless specifically indicated above     Objective:    BP (!) 117/59 (BP Location: Left Arm, Patient Position: Sitting, Cuff Size: Normal)   Pulse (!) 49   Temp 98.4 F (36.9 C)   Wt 163 lb 4.8 oz (74.1 kg)   LMP  (Exact Date)   SpO2 98%   BMI 30.65 kg/m   Wt Readings from Last 3 Encounters:  08/23/16 163 lb 4.8 oz (74.1 kg)  08/11/16 163 lb 9.6 oz (74.2 kg)  07/19/16 164 lb 9.6 oz (74.7 kg)    Physical Exam  Constitutional: She is oriented to person, place, and time. She appears well-developed and well-nourished. No distress.  HENT:  Head: Normocephalic and atraumatic.  Right Ear: Hearing normal.  Left Ear: Hearing normal.  Nose: Nose normal.  Eyes:  Conjunctivae and lids are normal. Right eye exhibits no discharge. Left eye exhibits no discharge. No scleral icterus.  Cardiovascular: Normal rate, regular rhythm, normal heart sounds and intact distal pulses.  Exam reveals no gallop and no friction rub.   No murmur heard. Pulmonary/Chest: Effort normal and breath sounds normal. No respiratory distress. She has no wheezes. She has no rales. She exhibits no tenderness.  Abdominal: Soft. Bowel sounds are normal. She exhibits no distension and no mass. There is no tenderness. There is no rebound and no guarding.  Musculoskeletal: Normal range of motion.  Neurological: She is alert and oriented to person, place, and time.  Skin: Skin is warm, dry and intact. No rash noted. She is not diaphoretic. No erythema. No pallor.  Psychiatric: She has a normal mood and affect. Her speech is normal and behavior is normal. Judgment and thought content normal. Cognition and memory are normal.  Nursing note and vitals reviewed.   Results for orders placed or performed in visit on 08/11/16  Microscopic Examination  Result Value Ref Range   WBC, UA 11-30 (A) 0 - 5 /hpf   RBC, UA None seen 0 - 2 /hpf   Epithelial Cells (non renal) 0-10 0 - 10 /hpf   Bacteria, UA Many (A) None seen/Few  CBC with Differential/Platelet  Result Value Ref Range   WBC 3.1 (L) 3.4 - 10.8 x10E3/uL  RBC 3.86 3.77 - 5.28 x10E6/uL   Hemoglobin 9.4 (L) 11.1 - 15.9 g/dL   Hematocrit 30.3 (L) 34.0 - 46.6 %   MCV 79 79 - 97 fL   MCH 24.4 (L) 26.6 - 33.0 pg   MCHC 31.0 (L) 31.5 - 35.7 g/dL   RDW 19.4 (H) 12.3 - 15.4 %   Platelets 197 150 - 379 x10E3/uL   Neutrophils 69 Not Estab. %   Lymphs 23 Not Estab. %   Monocytes 7 Not Estab. %   Eos 0 Not Estab. %   Basos 0 Not Estab. %   Neutrophils Absolute 2.1 1.4 - 7.0 x10E3/uL   Lymphocytes Absolute 0.7 0.7 - 3.1 x10E3/uL   Monocytes Absolute 0.2 0.1 - 0.9 x10E3/uL   EOS (ABSOLUTE) 0.0 0.0 - 0.4 x10E3/uL   Basophils Absolute 0.0 0.0 -  0.2 x10E3/uL   Immature Granulocytes 1 Not Estab. %   Immature Grans (Abs) 0.0 0.0 - 0.1 x10E3/uL  Comprehensive metabolic panel  Result Value Ref Range   Glucose 115 (H) 65 - 99 mg/dL   BUN 21 6 - 24 mg/dL   Creatinine, Ser 0.91 0.57 - 1.00 mg/dL   GFR calc non Af Amer 70 >59 mL/min/1.73   GFR calc Af Amer 80 >59 mL/min/1.73   BUN/Creatinine Ratio 23 9 - 23   Sodium 141 134 - 144 mmol/L   Potassium 4.0 3.5 - 5.2 mmol/L   Chloride 100 96 - 106 mmol/L   CO2 27 18 - 29 mmol/L   Calcium 9.3 8.7 - 10.2 mg/dL   Total Protein 6.9 6.0 - 8.5 g/dL   Albumin 3.9 3.5 - 5.5 g/dL   Globulin, Total 3.0 1.5 - 4.5 g/dL   Albumin/Globulin Ratio 1.3 1.2 - 2.2   Bilirubin Total 0.4 0.0 - 1.2 mg/dL   Alkaline Phosphatase 83 39 - 117 IU/L   AST 16 0 - 40 IU/L   ALT 15 0 - 32 IU/L  Lipid Panel w/o Chol/HDL Ratio  Result Value Ref Range   Cholesterol, Total 167 100 - 199 mg/dL   Triglycerides 128 0 - 149 mg/dL   HDL 41 >39 mg/dL   VLDL Cholesterol Cal 26 5 - 40 mg/dL   LDL Calculated 100 (H) 0 - 99 mg/dL  Microalbumin, Urine Waived  Result Value Ref Range   Microalb, Ur Waived 30 (H) 0 - 19 mg/L   Creatinine, Urine Waived 200 10 - 300 mg/dL   Microalb/Creat Ratio <30 <30 mg/g  UA/M w/rflx Culture, Routine  Result Value Ref Range   Specific Gravity, UA 1.015 1.005 - 1.030   pH, UA 5.5 5.0 - 7.5   Color, UA Yellow Yellow   Appearance Ur Cloudy (A) Clear   Leukocytes, UA 1+ (A) Negative   Protein, UA Negative Negative/Trace   Glucose, UA Negative Negative   Ketones, UA Negative Negative   RBC, UA Negative Negative   Bilirubin, UA Negative Negative   Urobilinogen, Ur 0.2 0.2 - 1.0 mg/dL   Nitrite, UA Positive (A) Negative   Microscopic Examination See below:    Urinalysis Reflex Comment   TSH  Result Value Ref Range   TSH 1.780 0.450 - 4.500 uIU/mL  Urine Culture, Routine  Result Value Ref Range   Urine Culture, Routine Final report (A)    Urine Culture result 1 Escherichia coli (A)      ANTIMICROBIAL SUSCEPTIBILITY Comment       Assessment & Plan:   Problem List Items Addressed This Visit  None    Visit Diagnoses    Acute cystitis without hematuria       1+ leuks on UA today- will await culture and treat if +. Continue to monitor.       Follow up plan: Return May , for HTN/Chol follow up.

## 2016-08-24 LAB — UA/M W/RFLX CULTURE, ROUTINE
BILIRUBIN UA: NEGATIVE
Glucose, UA: NEGATIVE
Ketones, UA: NEGATIVE
Nitrite, UA: NEGATIVE
PH UA: 5 (ref 5.0–7.5)
PROTEIN UA: NEGATIVE
RBC UA: NEGATIVE
Specific Gravity, UA: 1.02 (ref 1.005–1.030)
Urobilinogen, Ur: 0.2 mg/dL (ref 0.2–1.0)

## 2016-08-24 LAB — URINE CULTURE, REFLEX

## 2016-08-27 ENCOUNTER — Ambulatory Visit
Admission: RE | Admit: 2016-08-27 | Discharge: 2016-08-27 | Disposition: A | Discharge status: Home / Self Care | Payer: Medicare Other | Source: Ambulatory Visit | Attending: Family Medicine | Admitting: Family Medicine

## 2016-08-27 DIAGNOSIS — Z1231 Encounter for screening mammogram for malignant neoplasm of breast: Secondary | ICD-10-CM | POA: Diagnosis not present

## 2016-09-10 ENCOUNTER — Telehealth: Payer: Self-pay | Admitting: Family Medicine

## 2016-09-10 ENCOUNTER — Telehealth: Payer: Self-pay | Admitting: Internal Medicine

## 2016-09-10 NOTE — Telephone Encounter (Signed)
Spoke to Dr.Johnson re: referral to rheumatology back pain re: possible reason for anemia of chronic disease.

## 2016-09-10 NOTE — Telephone Encounter (Signed)
Called by Dr. B at the cancer center. 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 She had anemia and was sent to the hematology for work up. She has had extensive work up including bone marrow biopsy. Her anemia seems to be due to chronic disease, which Dr. B thinks is likely due to a rheumatologic issue associated with the flared back pain. He states that she is staying around 9-10 on her hemoglobin, but that there isn't much else that he can do for the anemia of chronic disease. Thanked him for his care of the patient.

## 2016-11-12 ENCOUNTER — Encounter: Payer: Self-pay | Admitting: Family Medicine

## 2016-11-12 ENCOUNTER — Ambulatory Visit (INDEPENDENT_AMBULATORY_CARE_PROVIDER_SITE_OTHER): Payer: Medicare Other | Admitting: Family Medicine

## 2016-11-12 VITALS — BP 103/58 | HR 70 | Temp 98.4°F | Ht 65.0 in | Wt 160.0 lb

## 2016-11-12 DIAGNOSIS — B9789 Other viral agents as the cause of diseases classified elsewhere: Secondary | ICD-10-CM

## 2016-11-12 DIAGNOSIS — R05 Cough: Secondary | ICD-10-CM | POA: Diagnosis not present

## 2016-11-12 DIAGNOSIS — J069 Acute upper respiratory infection, unspecified: Secondary | ICD-10-CM | POA: Diagnosis not present

## 2016-11-12 LAB — VERITOR FLU A/B WAIVED
Influenza A: NEGATIVE
Influenza B: NEGATIVE

## 2016-11-12 MED ORDER — BENZONATATE 100 MG PO CAPS: 200.0000 mg | ORAL_CAPSULE | Freq: Three times a day (TID) | ORAL | 0 refills | Status: DC | PRN

## 2016-11-12 MED ORDER — HYDROCOD POLST-CPM POLST ER 10-8 MG/5ML PO SUER: 5.0000 mL | Freq: Two times a day (BID) | ORAL | 0 refills | Status: DC | PRN

## 2016-11-12 NOTE — Patient Instructions (Signed)
Sudafed, mucinex, delsym

## 2016-11-12 NOTE — Progress Notes (Signed)
BP (!) 103/58   Pulse 70   Temp 98.4 F (36.9 C) (Oral)   Ht 5\' 5"  (1.651 m)   Wt 160 lb (72.6 kg)   SpO2 97%   BMI 26.63 kg/m    Subjective:    Patient ID: Kristina Henson, female    DOB: 10-06-1955, 61 y.o.   MRN: [id number]  HPI: Kristina Henson is a 61 y.o. female  Chief Complaint  Patient presents with  . Cough    Started Tuesday  . Fatigue  . Headache   Dry cough, possible fever, weakness, HA, fatigue, no appetite x 3 days. Denies congestion, sore throat, body aches. Trying allergy medicines with no relief. Has had several sick contacts.   Relevant past medical, surgical, family and social history reviewed and updated as indicated. Interim medical history since our last visit reviewed. Allergies and medications reviewed and updated.  Past Medical History:  Diagnosis Date  . Anemia   . Arthritis    BACK-LUMBAR  . Atrial fibrillation ([place])   . Coronary artery disease   . Dysrhythmia   . Hypertension   . Mass in neck    lt side  . Pneumonia 2016  . Stroke ([place]) 2012  . Stroke [facility]) 2012   Social History   Social History  . Marital status: Divorced    Spouse name: N/A  . Number of children: N/A  . Years of education: N/A   Occupational History  . Not on file.   Social History Main Topics  . Smoking status: Never Smoker  . Smokeless tobacco: Never Used  . Alcohol use No  . Drug use: No  . Sexual activity: Yes   Other Topics Concern  . Not on file   Social History Narrative  . No narrative on file    Review of Systems  Constitutional: Positive for appetite change, fatigue and fever.  Eyes: Negative.   Respiratory: Positive for cough.   Cardiovascular: Negative.   Gastrointestinal: Negative.   Genitourinary: Negative.   Musculoskeletal: Negative.   Neurological: Positive for weakness and headaches.  Psychiatric/Behavioral: Negative.     Per HPI unless specifically indicated above     Objective:    BP (!) 103/58   Pulse 70   Temp  98.4 F (36.9 C) (Oral)   Ht 5\' 5"  (1.651 m)   Wt 160 lb (72.6 kg)   SpO2 97%   BMI 26.63 kg/m   Wt Readings from Last 3 Encounters:  11/12/16 160 lb (72.6 kg)  08/23/16 163 lb 4.8 oz (74.1 kg)  08/11/16 163 lb 9.6 oz (74.2 kg)    Physical Exam  Constitutional: She is oriented to person, place, and time. She appears well-developed and well-nourished. No distress.  HENT:  Head: Atraumatic.  Nasal mucosa injected with thick discharge Mild middle ear effusion b/l  Eyes: Conjunctivae are normal. Pupils are equal, round, and reactive to light.  Neck: Normal range of motion. Neck supple.  Cardiovascular: Normal rate and normal heart sounds.   Pulmonary/Chest: Effort normal and breath sounds normal. No respiratory distress.  Musculoskeletal: Normal range of motion.  Neurological: She is alert and oriented to person, place, and time.  Skin: Skin is warm and dry.  Psychiatric: She has a normal mood and affect. Her behavior is normal.  Nursing note and vitals reviewed.     Assessment & Plan:   Problem List Items Addressed This Visit    None    Visit Diagnoses    Viral  URI with cough    -  Primary   Rapid flu negative, will send tessalon and tussionex for symptomatic treatment. Sudafed, ibuprofen prn. Supportive care discussed.    Relevant Orders   Veritor Flu A/B Waived (Completed)    Precautions given, including return precautions.    Follow up plan: Return if symptoms worsen or fail to improve.

## 2016-11-23 ENCOUNTER — Telehealth: Payer: Self-pay | Admitting: Family Medicine

## 2016-11-23 NOTE — Telephone Encounter (Signed)
Patient is needing some dental work that needs anesthesia so we need to figure out what the dentis office needs in order to do this procedure. I will call the dentist office tomorrow since they were closed today and get further instructions: Dr. Arletha Pili [phone]

## 2016-11-30 ENCOUNTER — Telehealth: Payer: Self-pay | Admitting: Family Medicine

## 2016-11-30 ENCOUNTER — Ambulatory Visit (INDEPENDENT_AMBULATORY_CARE_PROVIDER_SITE_OTHER): Payer: Medicare Other | Admitting: Family Medicine

## 2016-11-30 ENCOUNTER — Encounter: Payer: Self-pay | Admitting: Family Medicine

## 2016-11-30 VITALS — BP 153/80 | HR 54 | Temp 98.2°F | Wt 163.0 lb

## 2016-11-30 DIAGNOSIS — R399 Unspecified symptoms and signs involving the genitourinary system: Secondary | ICD-10-CM | POA: Diagnosis not present

## 2016-11-30 DIAGNOSIS — N390 Urinary tract infection, site not specified: Secondary | ICD-10-CM | POA: Diagnosis not present

## 2016-11-30 MED ORDER — SULFAMETHOXAZOLE-TRIMETHOPRIM 800-160 MG PO TABS: 1.0000 | ORAL_TABLET | Freq: Two times a day (BID) | ORAL | 0 refills | Status: DC

## 2016-11-30 NOTE — Patient Instructions (Signed)
Follow up as needed

## 2016-11-30 NOTE — Telephone Encounter (Signed)
Needs form for dentist- letter written and faxed off.

## 2016-11-30 NOTE — Progress Notes (Signed)
   BP (!) 153/80   Pulse (!) 54   Temp 98.2 F (36.8 C)   Wt 163 lb (73.9 kg)   SpO2 96%   BMI 27.12 kg/m    Subjective:    Patient ID: Kristina Henson, female    DOB: 1956-02-24, 61 y.o.   MRN: [id number]  HPI: Kristina Henson is a 61 y.o. female  Chief Complaint  Patient presents with  . Urinary Tract Infection    x 1-2 weeks of urinary frequency, seeing white stuff in her urine, odor. No burning.    Frequency and seeing some white material in her urine x 1-2 weeks. Denies fever, chills, N/V, back pain.  Has not been trying anything OTC, has been drinking lots of water and taking cranberry pills since onset. Does have a hx of UTIs.   Relevant past medical, surgical, family and social history reviewed and updated as indicated. Interim medical history since our last visit reviewed. Allergies and medications reviewed and updated.  Review of Systems  Constitutional: Negative.   HENT: Negative.   Respiratory: Negative.   Cardiovascular: Negative.   Gastrointestinal: Negative.   Genitourinary: Positive for frequency.  Musculoskeletal: Negative.   Neurological: Negative.   Psychiatric/Behavioral: Negative.     Per HPI unless specifically indicated above     Objective:    BP (!) 153/80   Pulse (!) 54   Temp 98.2 F (36.8 C)   Wt 163 lb (73.9 kg)   SpO2 96%   BMI 27.12 kg/m   Wt Readings from Last 3 Encounters:  11/30/16 163 lb (73.9 kg)  11/12/16 160 lb (72.6 kg)  08/23/16 163 lb 4.8 oz (74.1 kg)    Physical Exam  Constitutional: She is oriented to person, place, and time. She appears well-developed and well-nourished.  HENT:  Head: Atraumatic.  Eyes: Conjunctivae are normal. Pupils are equal, round, and reactive to light.  Neck: Normal range of motion. Neck supple.  Cardiovascular: Normal rate and normal heart sounds.   Pulmonary/Chest: Effort normal and breath sounds normal.  Abdominal: Soft. Bowel sounds are normal.  Musculoskeletal: Normal range of  motion.  Neurological: She is alert and oriented to person, place, and time.  Skin: Skin is warm and dry.  Psychiatric: She has a normal mood and affect. Her behavior is normal.  Nursing note and vitals reviewed.     Assessment & Plan:   Problem List Items Addressed This Visit    None    Visit Diagnoses    Acute lower UTI    -  Primary   U/A + for leuks and nit. Will treat with bactrim. Push fluids, continue cranberry supplement. Start probiotic. Await cx.    Relevant Medications   sulfamethoxazole-trimethoprim (BACTRIM DS,SEPTRA DS) 800-160 MG tablet   Other Relevant Orders   UA/M w/rflx Culture, Routine (STAT) (Completed)       Follow up plan: Return if symptoms worsen or fail to improve.

## 2016-11-30 NOTE — Telephone Encounter (Signed)
Form from dentist office filled out and faxed to [facility]. Form scanned into chart.

## 2016-11-30 NOTE — Telephone Encounter (Signed)
Dentist office faxed over form for Dr. Wynetta Emery to fill out and fax back.

## 2016-12-03 LAB — UA/M W/RFLX CULTURE, ROUTINE
Bilirubin, UA: NEGATIVE
Glucose, UA: NEGATIVE
Ketones, UA: NEGATIVE
Nitrite, UA: POSITIVE — AB
PH UA: 6.5 (ref 5.0–7.5)
Protein, UA: NEGATIVE
RBC, UA: NEGATIVE
Specific Gravity, UA: 1.015 (ref 1.005–1.030)
Urobilinogen, Ur: 0.2 mg/dL (ref 0.2–1.0)

## 2016-12-03 LAB — MICROSCOPIC EXAMINATION: RBC MICROSCOPIC, UA: NONE SEEN /HPF (ref 0–?)

## 2016-12-03 LAB — URINE CULTURE, REFLEX

## 2016-12-06 ENCOUNTER — Encounter: Payer: Self-pay | Admitting: Family Medicine

## 2016-12-06 ENCOUNTER — Ambulatory Visit
Admission: RE | Admit: 2016-12-06 | Discharge: 2016-12-06 | Disposition: A | Discharge status: Home / Self Care | Payer: Medicare Other | Source: Ambulatory Visit | Attending: Internal Medicine | Admitting: Internal Medicine

## 2016-12-06 DIAGNOSIS — D1803 Hemangioma of intra-abdominal structures: Secondary | ICD-10-CM | POA: Insufficient documentation

## 2016-12-06 DIAGNOSIS — K449 Diaphragmatic hernia without obstruction or gangrene: Secondary | ICD-10-CM | POA: Insufficient documentation

## 2016-12-06 DIAGNOSIS — K7689 Other specified diseases of liver: Secondary | ICD-10-CM | POA: Diagnosis not present

## 2016-12-06 LAB — POCT I-STAT CREATININE: Creatinine, Ser: 1 mg/dL (ref 0.44–1.00)

## 2016-12-06 MED ORDER — GADOBENATE DIMEGLUMINE 529 MG/ML IV SOLN
15.0000 mL | Freq: Once | INTRAVENOUS | Status: AC | PRN
Start: 2016-12-06 — End: 2016-12-06
  Administered 2016-12-06: 15 mL via INTRAVENOUS

## 2016-12-10 ENCOUNTER — Inpatient Hospital Stay: Payer: Medicare Other | Attending: Internal Medicine

## 2016-12-10 DIAGNOSIS — I4891 Unspecified atrial fibrillation: Secondary | ICD-10-CM | POA: Diagnosis not present

## 2016-12-10 DIAGNOSIS — D72810 Lymphocytopenia: Secondary | ICD-10-CM | POA: Insufficient documentation

## 2016-12-10 DIAGNOSIS — M549 Dorsalgia, unspecified: Secondary | ICD-10-CM | POA: Insufficient documentation

## 2016-12-10 DIAGNOSIS — I2510 Atherosclerotic heart disease of native coronary artery without angina pectoris: Secondary | ICD-10-CM | POA: Insufficient documentation

## 2016-12-10 DIAGNOSIS — Z79899 Other long term (current) drug therapy: Secondary | ICD-10-CM | POA: Diagnosis not present

## 2016-12-10 DIAGNOSIS — Z7982 Long term (current) use of aspirin: Secondary | ICD-10-CM | POA: Diagnosis not present

## 2016-12-10 DIAGNOSIS — D72819 Decreased white blood cell count, unspecified: Secondary | ICD-10-CM | POA: Diagnosis not present

## 2016-12-10 DIAGNOSIS — I517 Cardiomegaly: Secondary | ICD-10-CM | POA: Insufficient documentation

## 2016-12-10 DIAGNOSIS — D7389 Other diseases of spleen: Secondary | ICD-10-CM | POA: Diagnosis not present

## 2016-12-10 DIAGNOSIS — D367 Benign neoplasm of other specified sites: Secondary | ICD-10-CM | POA: Insufficient documentation

## 2016-12-10 DIAGNOSIS — Z8701 Personal history of pneumonia (recurrent): Secondary | ICD-10-CM | POA: Diagnosis not present

## 2016-12-10 DIAGNOSIS — K769 Liver disease, unspecified: Secondary | ICD-10-CM | POA: Insufficient documentation

## 2016-12-10 DIAGNOSIS — R0602 Shortness of breath: Secondary | ICD-10-CM | POA: Diagnosis not present

## 2016-12-10 DIAGNOSIS — Z8673 Personal history of transient ischemic attack (TIA), and cerebral infarction without residual deficits: Secondary | ICD-10-CM | POA: Diagnosis not present

## 2016-12-10 DIAGNOSIS — G8929 Other chronic pain: Secondary | ICD-10-CM | POA: Insufficient documentation

## 2016-12-10 DIAGNOSIS — I10 Essential (primary) hypertension: Secondary | ICD-10-CM | POA: Insufficient documentation

## 2016-12-10 DIAGNOSIS — D649 Anemia, unspecified: Secondary | ICD-10-CM | POA: Diagnosis not present

## 2016-12-10 DIAGNOSIS — Z809 Family history of malignant neoplasm, unspecified: Secondary | ICD-10-CM | POA: Diagnosis not present

## 2016-12-10 DIAGNOSIS — D1803 Hemangioma of intra-abdominal structures: Secondary | ICD-10-CM

## 2016-12-10 LAB — CBC WITH DIFFERENTIAL/PLATELET
BASOS ABS: 0 10*3/uL (ref 0–0.1)
Basophils Relative: 1 %
EOS PCT: 1 %
Eosinophils Absolute: 0 10*3/uL (ref 0–0.7)
HCT: 29 % — ABNORMAL LOW (ref 35.0–47.0)
Hemoglobin: 9.6 g/dL — ABNORMAL LOW (ref 12.0–16.0)
LYMPHS PCT: 35 %
Lymphs Abs: 0.7 10*3/uL — ABNORMAL LOW (ref 1.0–3.6)
MCH: 26.1 pg (ref 26.0–34.0)
MCHC: 33.1 g/dL (ref 32.0–36.0)
MCV: 79 fL — AB (ref 80.0–100.0)
Monocytes Absolute: 0.2 10*3/uL (ref 0.2–0.9)
Monocytes Relative: 9 %
NEUTROS ABS: 1.1 10*3/uL — AB (ref 1.4–6.5)
Neutrophils Relative %: 54 %
Platelets: 163 10*3/uL (ref 150–440)
RBC: 3.67 MIL/uL — AB (ref 3.80–5.20)
RDW: 18.1 % — ABNORMAL HIGH (ref 11.5–14.5)
WBC: 2 10*3/uL — AB (ref 3.6–11.0)

## 2016-12-10 LAB — BASIC METABOLIC PANEL
ANION GAP: 5 (ref 5–15)
BUN: 36 mg/dL — ABNORMAL HIGH (ref 6–20)
CO2: 27 mmol/L (ref 22–32)
Calcium: 9.1 mg/dL (ref 8.9–10.3)
Chloride: 105 mmol/L (ref 101–111)
Creatinine, Ser: 0.91 mg/dL (ref 0.44–1.00)
Glucose, Bld: 109 mg/dL — ABNORMAL HIGH (ref 65–99)
POTASSIUM: 3.7 mmol/L (ref 3.5–5.1)
SODIUM: 137 mmol/L (ref 135–145)

## 2016-12-10 LAB — C-REACTIVE PROTEIN: CRP: 4.2 mg/dL — ABNORMAL HIGH (ref <1.0)

## 2016-12-13 ENCOUNTER — Inpatient Hospital Stay (HOSPITAL_BASED_OUTPATIENT_CLINIC_OR_DEPARTMENT_OTHER): Payer: Medicare Other | Admitting: Internal Medicine

## 2016-12-13 VITALS — BP 126/69 | HR 66 | Temp 97.6°F | Resp 18 | Ht 65.0 in | Wt 167.8 lb

## 2016-12-13 DIAGNOSIS — Z7982 Long term (current) use of aspirin: Secondary | ICD-10-CM

## 2016-12-13 DIAGNOSIS — R0602 Shortness of breath: Secondary | ICD-10-CM

## 2016-12-13 DIAGNOSIS — D649 Anemia, unspecified: Secondary | ICD-10-CM | POA: Diagnosis not present

## 2016-12-13 DIAGNOSIS — G8929 Other chronic pain: Secondary | ICD-10-CM

## 2016-12-13 DIAGNOSIS — D72810 Lymphocytopenia: Secondary | ICD-10-CM

## 2016-12-13 DIAGNOSIS — Z809 Family history of malignant neoplasm, unspecified: Secondary | ICD-10-CM

## 2016-12-13 DIAGNOSIS — M549 Dorsalgia, unspecified: Secondary | ICD-10-CM | POA: Diagnosis not present

## 2016-12-13 DIAGNOSIS — K769 Liver disease, unspecified: Secondary | ICD-10-CM

## 2016-12-13 DIAGNOSIS — I517 Cardiomegaly: Secondary | ICD-10-CM | POA: Diagnosis not present

## 2016-12-13 DIAGNOSIS — D367 Benign neoplasm of other specified sites: Secondary | ICD-10-CM

## 2016-12-13 DIAGNOSIS — I4891 Unspecified atrial fibrillation: Secondary | ICD-10-CM

## 2016-12-13 DIAGNOSIS — Z79899 Other long term (current) drug therapy: Secondary | ICD-10-CM

## 2016-12-13 DIAGNOSIS — Z8701 Personal history of pneumonia (recurrent): Secondary | ICD-10-CM

## 2016-12-13 DIAGNOSIS — I2510 Atherosclerotic heart disease of native coronary artery without angina pectoris: Secondary | ICD-10-CM | POA: Diagnosis not present

## 2016-12-13 DIAGNOSIS — D638 Anemia in other chronic diseases classified elsewhere: Secondary | ICD-10-CM

## 2016-12-13 DIAGNOSIS — D7389 Other diseases of spleen: Secondary | ICD-10-CM | POA: Diagnosis not present

## 2016-12-13 DIAGNOSIS — I10 Essential (primary) hypertension: Secondary | ICD-10-CM

## 2016-12-13 DIAGNOSIS — D72819 Decreased white blood cell count, unspecified: Secondary | ICD-10-CM | POA: Diagnosis not present

## 2016-12-13 DIAGNOSIS — Z8673 Personal history of transient ischemic attack (TIA), and cerebral infarction without residual deficits: Secondary | ICD-10-CM

## 2016-12-13 NOTE — Assessment & Plan Note (Addendum)
#  Anemia- likely secondary to anemia of chronic disease [unclear etiology]- bone marrow biopsy negative for any primary bone marrow problems like MDS; cytogenetics normal. Continue PO iron once a day. Today Hb 9.3/Stable.   # Anemia of chronic disease- question etiology; CRP- elevated CRP >4; back pain- improved s/p PT.  # Mild leukopenia white count 2.5/lymphopenia. [BMBx- NEG] Asymptomatic.check HIV/hepatitis/ copper/zinc testing at next visit.   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 No further follow up scans recommended.   # Follow-up in 4 months with labs 1 week prior.  

## 2016-12-13 NOTE — Progress Notes (Signed)
[facility] NOTE  Patient Care Team: Valerie Roys, DO as PCP - General (Family Medicine) Teodoro Spray, MD as Consulting Physician (Cardiology) Carloyn Manner, MD as Referring Physician (Otolaryngology)  CHIEF COMPLAINTS/PURPOSE OF CONSULTATION:   # Anemia of chronic disease- EGD/ [place] [Dr.Wohl; 2017] chronic back pain [? 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    No history exists.     HISTORY OF PRESENTING ILLNESS:  Kristina Henson 61 y.o.  female noted Noted to have anemia of Chronic disease [? Etiology]-is here to review the workup/ also reviewed the results of the MRI that was ordered for a liver lesion.  Patient continues to complain of back pain- however significant improvement post physical therapy. Patient complains of fatigue. Shortness of breath on exertion- chronic mild. No blood in stools or black stools.   ROS: A complete 10 point review of system is done which is negative except mentioned above in history of present illness  MEDICAL HISTORY:  Past Medical History:  Diagnosis Date  . Anemia   . Arthritis    BACK-LUMBAR  . Atrial fibrillation ([place])   . Coronary artery disease   . Dysrhythmia   . Hypertension   . Mass in neck    lt side  . Pneumonia 2016  . Stroke ([place]) 2012  . Stroke [facility]) 2012    SURGICAL HISTORY: Past Surgical History:  Procedure Laterality Date  . ABDOMINAL HYSTERECTOMY     Total  . cardiac catherization    . COLONOSCOPY WITH PROPOFOL N/A 11/18/2015   Procedure: COLONOSCOPY WITH PROPOFOL;  Surgeon: Lucilla Lame, MD;  Location: [facility] ENDOSCOPY;  Service: Endoscopy;  Laterality: N/A;  . CORONARY ANGIOPLASTY    . ESOPHAGOGASTRODUODENOSCOPY (EGD) WITH PROPOFOL N/A 04/23/2016   Procedure:  ESOPHAGOGASTRODUODENOSCOPY (EGD) WITH PROPOFOL;  Surgeon: Lucilla Lame, MD;  Location: [place];  Service: Endoscopy;  Laterality: N/A;  small bowel bx gastric antrum bx  . throat biopsy      SOCIAL HISTORY: Social History   Social History  . Marital status: Divorced    Spouse name: N/A  . Number of children: N/A  . Years of education: N/A   Occupational History  . Not on file.   Social History Main Topics  . Smoking status: Never Smoker  . Smokeless tobacco: Never Used  . Alcohol use No  . Drug use: No  . Sexual activity: Yes   Other Topics Concern  . Not on file   Social History Narrative  . No narrative on file    FAMILY HISTORY: Family History  Problem Relation Age of Onset  . Arthritis Mother   . Cancer Mother   . Hypertension Sister   . Asthma Brother   . Breast cancer Neg Hx     ALLERGIES:  is allergic to amoxicillin.  MEDICATIONS:  Current Outpatient Prescriptions  Medication Sig Dispense Refill  . amiodarone (PACERONE) 200 MG tablet Take 200 mg by mouth every morning.     Marland Kitchen aspirin EC 81 MG tablet Take 81 mg by mouth daily.     Marland Kitchen CRANBERRY PO Take 1 tablet by mouth daily.    . ferrous sulfate 325 (65 FE) MG EC tablet Take 1 tablet (325 mg total) by mouth 3 (three) times daily with meals. 90 tablet 1  .  hydrALAZINE (APRESOLINE) 50 MG tablet Take 50 mg by mouth 2 (two) times daily.    Marland Kitchen lisinopril-hydrochlorothiazide (PRINZIDE,ZESTORETIC) 20-25 MG tablet Take 2 tablets by mouth daily. 180 tablet 1  . sulfamethoxazole-trimethoprim (BACTRIM DS,SEPTRA DS) 800-160 MG tablet Take 1 tablet by mouth 2 (two) times daily. (Patient not taking: Reported on 12/13/2016) 6 tablet 0   No current facility-administered medications for this visit.       Marland Kitchen  PHYSICAL EXAMINATION: ECOG PERFORMANCE STATUS: 1 - Symptomatic but completely ambulatory  Vitals:   12/13/16 1442  BP: 126/69  Pulse: 66  Resp: 18  Temp: 97.6 F (36.4 C)   Filed Weights    12/13/16 1442  Weight: 167 lb 12.8 oz (76.1 kg)    GENERAL: Well-nourished well-developed; Alert, no distress and comfortable.  She is alone.  EYES: no pallor or icterus OROPHARYNX: no thrush or ulceration; good dentition  NECK: supple.  LYMPH: Approximate 2 cm submandibular mass felt  no palpable lymphadenopathy in the axillary or inguinal regions LUNGS: Decreased breath sounds at the right lower base.  No wheeze or crackles HEART/CVS: regular rate & rhythm and no murmurs; No lower extremity edema ABDOMEN: abdomen soft, non-tender and normal bowel sounds Musculoskeletal:no cyanosis of digits and no clubbing  PSYCH: alert & oriented x 3 with fluent speech NEURO: no focal motor/sensory deficits SKIN:  no rashes or significant lesions  LABORATORY DATA:  I have reviewed the data as listed Lab Results  Component Value Date   WBC 2.0 (L) 12/10/2016   HGB 9.6 (L) 12/10/2016   HCT 29.0 (L) 12/10/2016   MCV 79.0 (L) 12/10/2016   PLT 163 12/10/2016    Recent Labs  04/30/16 1644 05/02/16 1527 08/11/16 0830 12/06/16 0811 12/10/16 1350  NA 137 136 141  --  137  K 3.7 4.1 4.0  --  3.7  CL 102 101 100  --  105  CO2 27 25 27   --  27  GLUCOSE 107* 120* 115*  --  109*  BUN 36* 26* 21  --  36*  CREATININE 1.12* 0.88 0.91 1.00 0.91  CALCIUM 9.3 9.8 9.3  --  9.1  GFRNONAA 53* >60 70  --  >60  GFRAA >60 >60 80  --  >60  PROT 7.2 7.1 6.9  --   --   ALBUMIN 3.4* 3.4* 3.9  --   --   AST 22 22 16   --   --   ALT 16 19 15   --   --   ALKPHOS 56 60 83  --   --   BILITOT 0.8 0.5 0.4  --   --     RADIOGRAPHIC STUDIES: I have personally reviewed the radiological images as listed and agreed with the findings in the report. Mr Abdomen W Wo Contrast  Result Date: 12/06/2016 CLINICAL DATA:  Right hepatic lobe lesion on CT. Anemia. No history of primary malignancy submitted. EXAM: MRI ABDOMEN WITHOUT AND WITH CONTRAST TECHNIQUE: Multiplanar multisequence MR imaging of the abdomen was performed  both before and after the administration of intravenous contrast. CONTRAST:  38m MULTIHANCE GADOBENATE DIMEGLUMINE 529 MG/ML IV SOLN COMPARISON:  CT 05/20/2016 and ultrasound of 05/02/2016 FINDINGS: Mild to moderate motion degradation, especially involving the pre and postcontrast dynamic series. Lower chest: Mild cardiomegaly.  Tiny hiatal hernia. Hepatobiliary: No evidence of cirrhosis. Corresponding to the CT abnormality, within the posterior right hepatic lobe, is a 1.6 cm T2 hyperintense lesion on image 8/ series 3. Although suboptimally evaluated after contrast,  this demonstrates diffuse postcontrast enhancement including on image 23/series 19. Normal gallbladder, without biliary ductal dilatation. Pancreas:  Normal, without mass or ductal dilatation. Spleen: Subcapsular 11 mm superior splenic T2 hyperintense focus measures 11 mm on image 7/ series 2. This is similar to on the prior exam. Not well characterized after contrast, but of doubtful clinical significance. Adrenals/Urinary Tract: Normal adrenal glands. Bilateral too small to characterize renal lesions. Left extrarenal pelvis. No hydronephrosis. Stomach/Bowel: Normal remainder of the stomach. Grossly normal large and small bowel loops. Vascular/Lymphatic: Normal caliber of the aorta and branch vessels. No retroperitoneal or retrocrural adenopathy. Other:  No ascites. Musculoskeletal: No acute osseous abnormality. IMPRESSION: 1. Mild to moderately motion degraded exam. 2. The right hepatic lobe lesion is unchanged in size, and most consistent with a hemangioma. 3.  Tiny hiatal hernia. Electronically Signed   By: Abigail Miyamoto M.D.   On: 12/06/2016 10:42    ASSESSMENT & PLAN:   Anemia due to chronic illness # Anemia- likely secondary to anemia of chronic disease [unclear etiology]- bone marrow biopsy negative for any primary bone marrow problems like MDS; cytogenetics normal. Continue PO iron once a day. Today Hb 9.3/Stable.   # Anemia of chronic  disease- question etiology; CRP- elevated CRP >4; back pain- improved s/p PT.  # Mild leukopenia white count 2.5/lymphopenia. [BMBx- NEG] Asymptomatic.check HIV/hepatitis/ copper/zinc testing at next visit.   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 No further follow up scans recommended.   # Follow-up in 4 months with labs 1 week prior.   All questions were answered. The patient knows to call the clinic with any problems, questions or concerns.     Cammie Sickle, MD 12/13/2016 4:33 PM

## 2017-01-04 DIAGNOSIS — D117 Benign neoplasm of other major salivary glands: Secondary | ICD-10-CM | POA: Diagnosis not present

## 2017-02-07 DIAGNOSIS — I639 Cerebral infarction, unspecified: Secondary | ICD-10-CM | POA: Diagnosis not present

## 2017-02-07 DIAGNOSIS — I517 Cardiomegaly: Secondary | ICD-10-CM | POA: Diagnosis not present

## 2017-02-07 DIAGNOSIS — E782 Mixed hyperlipidemia: Secondary | ICD-10-CM | POA: Diagnosis not present

## 2017-02-07 DIAGNOSIS — I10 Essential (primary) hypertension: Secondary | ICD-10-CM | POA: Diagnosis not present

## 2017-02-07 DIAGNOSIS — I480 Paroxysmal atrial fibrillation: Secondary | ICD-10-CM | POA: Diagnosis not present

## 2017-02-07 DIAGNOSIS — Z79899 Other long term (current) drug therapy: Secondary | ICD-10-CM | POA: Diagnosis not present

## 2017-02-07 LAB — BASIC METABOLIC PANEL
BUN: 26 mg/dL — AB (ref 4–21)
Creatinine: 0.8 mg/dL (ref 0.5–1.1)
GLUCOSE: 85 mg/dL
POTASSIUM: 3.9 mmol/L (ref 3.4–5.3)
Sodium: 139 mmol/L (ref 137–147)

## 2017-02-07 LAB — CBC AND DIFFERENTIAL
HCT: 33 % — AB (ref 36–46)
HEMOGLOBIN: 10.4 g/dL — AB (ref 12.0–16.0)
PLATELETS: 139 10*3/uL — AB (ref 150–399)
WBC: 2.8 10*3/mL

## 2017-02-07 LAB — HEPATIC FUNCTION PANEL
ALT: 16 U/L (ref 7–35)
AST: 16 U/L (ref 13–35)
BILIRUBIN, TOTAL: 0.7 mg/dL

## 2017-02-07 LAB — TSH: TSH: 1.4 u[IU]/mL (ref 0.41–5.90)

## 2017-02-09 ENCOUNTER — Ambulatory Visit (INDEPENDENT_AMBULATORY_CARE_PROVIDER_SITE_OTHER): Payer: Medicare Other | Admitting: Family Medicine

## 2017-02-09 ENCOUNTER — Encounter: Payer: Self-pay | Admitting: Family Medicine

## 2017-02-09 VITALS — BP 136/62 | HR 58 | Wt 169.0 lb

## 2017-02-09 DIAGNOSIS — I129 Hypertensive chronic kidney disease with stage 1 through stage 4 chronic kidney disease, or unspecified chronic kidney disease: Secondary | ICD-10-CM

## 2017-02-09 DIAGNOSIS — D509 Iron deficiency anemia, unspecified: Secondary | ICD-10-CM | POA: Diagnosis not present

## 2017-02-09 DIAGNOSIS — D72819 Decreased white blood cell count, unspecified: Secondary | ICD-10-CM

## 2017-02-09 DIAGNOSIS — E782 Mixed hyperlipidemia: Secondary | ICD-10-CM | POA: Diagnosis not present

## 2017-02-09 MED ORDER — FERROUS SULFATE 325 (65 FE) MG PO TBEC: 325.0000 mg | DELAYED_RELEASE_TABLET | Freq: Three times a day (TID) | ORAL | 1 refills | Status: DC

## 2017-02-09 MED ORDER — LISINOPRIL-HYDROCHLOROTHIAZIDE 20-25 MG PO TABS: 2.0000 | ORAL_TABLET | Freq: Every day | ORAL | 1 refills | Status: DC

## 2017-02-09 NOTE — Assessment & Plan Note (Signed)
Dropping. Restart iron. Recheck 1 month. If not improved or platelets and leukocytes stay low, will refer to hematology.

## 2017-02-09 NOTE — Progress Notes (Signed)
BP 136/62   Pulse (!) 58   Wt 169 lb (76.7 kg)   SpO2 98%   BMI 28.12 kg/m    Subjective:    Patient ID: Kristina Henson, female    DOB: [birth year], 61 y.o.   MRN: [id number]  HPI: Kristina Henson is a 61 y.o. female  Chief Complaint  Patient presents with  . Follow-up   HYPERTENSION / [place] Satisfied with current treatment? yes Duration of hypertension: chronic BP monitoring frequency: not checking BP medication side effects: no Past BP meds: lisinopril-HCTZ Duration of hyperlipidemia: chronic Cholesterol medication side effects: Not taking anything Cholesterol supplements: none Past cholesterol medications: none Medication compliance: excellent compliance Aspirin: yes Recent stressors: no Recurrent headaches: no Visual changes: no Palpitations: no Dyspnea: no Chest pain: no Lower extremity edema: no Dizzy/lightheaded: no  Relevant past medical, surgical, family and social history reviewed and updated as indicated. Interim medical history since our last visit reviewed. Allergies and medications reviewed and updated.  Review of Systems  Constitutional: Negative.   Respiratory: Negative.   Cardiovascular: Negative.   Psychiatric/Behavioral: Negative.     Per HPI unless specifically indicated above     Objective:    BP 136/62   Pulse (!) 58   Wt 169 lb (76.7 kg)   SpO2 98%   BMI 28.12 kg/m   Wt Readings from Last 3 Encounters:  02/09/17 169 lb (76.7 kg)  12/13/16 167 lb 12.8 oz (76.1 kg)  11/30/16 163 lb (73.9 kg)    Physical Exam  Constitutional: She is oriented to person, place, and time. She appears well-developed and well-nourished. No distress.  HENT:  Head: Normocephalic and atraumatic.  Right Ear: Hearing normal.  Left Ear: Hearing normal.  Nose: Nose normal.  Eyes: Conjunctivae and lids are normal. Right eye exhibits no discharge. Left eye exhibits no discharge. No scleral icterus.  Cardiovascular: Normal rate, regular rhythm,  normal heart sounds and intact distal pulses.  Exam reveals no gallop and no friction rub.   No murmur heard. Pulmonary/Chest: Effort normal and breath sounds normal. No respiratory distress. She has no wheezes. She has no rales. She exhibits no tenderness.  Musculoskeletal: Normal range of motion.  Neurological: She is alert and oriented to person, place, and time.  Skin: Skin is warm, dry and intact. No rash noted. She is not diaphoretic. No erythema. No pallor.  Psychiatric: She has a normal mood and affect. Her speech is normal and behavior is normal. Judgment and thought content normal. Cognition and memory are normal.  Nursing note and vitals reviewed.   Results for orders placed or performed in visit on 76/19/50  Basic metabolic panel  Result Value Ref Range   Glucose 85 mg/dL   BUN 26 (A) 4 - 21 mg/dL   Creatinine 0.8 0.5 - 1.1 mg/dL   Potassium 3.9 3.4 - 5.3 mmol/L   Sodium 139 137 - 147 mmol/L  Hepatic function panel  Result Value Ref Range   ALT 16 7 - 35 U/L   AST 16 13 - 35 U/L   Bilirubin, Total 0.7 mg/dL  CBC and differential  Result Value Ref Range   Hemoglobin 10.4 (A) 12.0 - 16.0 g/dL   HCT 33 (A) 36 - 46 %   Platelets 139 (A) 150 - 399 K/L   WBC 2.8 10^3/mL  TSH  Result Value Ref Range   TSH 1.40 0.41 - 5.90 uIU/mL      Assessment & Plan:   Problem List Items  Addressed This Visit      Genitourinary   Benign hypertensive renal disease    Under good control. Will continue current regimen. Continue to monitor. Call with any concerns.         Other   HLD (hyperlipidemia) - Primary    Rechecking levels today. Await results.       Relevant Medications   lisinopril-hydrochlorothiazide (PRINZIDE,ZESTORETIC) 20-25 MG tablet   Other Relevant Orders   Lipid Panel w/o Chol/HDL Ratio   Iron deficiency anemia    Dropping. Restart iron. Recheck 1 month. If not improved or platelets and leukocytes stay low, will refer to hematology.      Relevant  Medications   ferrous sulfate 325 (65 FE) MG EC tablet    Other Visit Diagnoses    Leukopenia, unspecified type       Dropping. Recheck 1 month. If not improved or platelets and leukocytes stay low, will refer to hematology.       Follow up plan: Return in about 4 weeks (around 03/09/2017) for Recheck CBC.

## 2017-02-09 NOTE — Assessment & Plan Note (Signed)
Rechecking levels today. Await results.  

## 2017-02-09 NOTE — Assessment & Plan Note (Signed)
Under good control. Will continue current regimen. Continue to monitor. Call with any concerns.

## 2017-02-10 ENCOUNTER — Encounter: Payer: Self-pay | Admitting: Family Medicine

## 2017-02-10 LAB — LIPID PANEL W/O CHOL/HDL RATIO
CHOLESTEROL TOTAL: 179 mg/dL (ref 100–199)
HDL: 40 mg/dL (ref >39)
LDL CALC: 107 mg/dL — AB (ref 0–99)
TRIGLYCERIDES: 160 mg/dL — AB (ref 0–149)
VLDL CHOLESTEROL CAL: 32 mg/dL (ref 5–40)

## 2017-03-03 DIAGNOSIS — Z79899 Other long term (current) drug therapy: Secondary | ICD-10-CM | POA: Diagnosis not present

## 2017-03-09 ENCOUNTER — Ambulatory Visit: Payer: Medicare Other | Admitting: Family Medicine

## 2017-03-17 ENCOUNTER — Ambulatory Visit (INDEPENDENT_AMBULATORY_CARE_PROVIDER_SITE_OTHER): Payer: Medicare Other | Admitting: Family Medicine

## 2017-03-17 ENCOUNTER — Encounter: Payer: Self-pay | Admitting: Family Medicine

## 2017-03-17 VITALS — BP 128/70 | HR 53 | Temp 98.4°F | Wt 169.1 lb

## 2017-03-17 DIAGNOSIS — R300 Dysuria: Secondary | ICD-10-CM

## 2017-03-17 DIAGNOSIS — I482 Chronic atrial fibrillation, unspecified: Secondary | ICD-10-CM

## 2017-03-17 DIAGNOSIS — D509 Iron deficiency anemia, unspecified: Secondary | ICD-10-CM

## 2017-03-17 LAB — UA/M W/RFLX CULTURE, ROUTINE
Bilirubin, UA: NEGATIVE
GLUCOSE, UA: NEGATIVE
KETONES UA: NEGATIVE
LEUKOCYTES UA: NEGATIVE
Nitrite, UA: NEGATIVE
PROTEIN UA: NEGATIVE
RBC, UA: NEGATIVE
SPEC GRAV UA: 1.01 (ref 1.005–1.030)
Urobilinogen, Ur: 0.2 mg/dL (ref 0.2–1.0)
pH, UA: 5 (ref 5.0–7.5)

## 2017-03-17 NOTE — Assessment & Plan Note (Signed)
Stable. Continue to follow with cardiology. Continue to monitor.  

## 2017-03-17 NOTE — Progress Notes (Signed)
BP 128/70   Pulse (!) 53   Temp 98.4 F (36.9 C)   Wt 169 lb 1 oz (76.7 kg)   SpO2 99%   BMI 28.13 kg/m    Subjective:    Patient ID: Kristina Henson, female    DOB: October 18, 1955, 61 y.o.   MRN: [id number]  HPI: Kristina Henson is a 61 y.o. female  Chief Complaint  Patient presents with  . Anemia  . Urinary Tract Infection   ANEMIA Anemia status: better Etiology of anemia: Duration of anemia treatment: about a month Compliance with treatment: good compliance Iron supplementation side effects: no  Severity of anemia: mild Fatigue: yes Decreased exercise tolerance: no  Dyspnea on exertion: no Palpitations: no Bleeding: no Pica: no  URINARY SYMPTOMS Duration: about a week Dysuria: no Urinary frequency: yes Urgency: yes Small volume voids: no Symptom severity: mild Urinary incontinence: no Foul odor: no Hematuria: no Abdominal pain: no Back pain: no Suprapubic pain/pressure: no Flank pain: no Fever:  no Vomiting: no Relief with cranberry juice: no Relief with pyridium: no Status: stable Previous urinary tract infection: yes Recurrent urinary tract infection: no Sexual activity: No sexually active/monogomous/practicing safe sex   Relevant past medical, surgical, family and social history reviewed and updated as indicated. Interim medical history since our last visit reviewed. Allergies and medications reviewed and updated.  Review of Systems  Constitutional: Negative.   Respiratory: Negative.   Cardiovascular: Negative.   Genitourinary: Positive for frequency and urgency. Negative for decreased urine volume, difficulty urinating, dyspareunia, dysuria, enuresis, flank pain, genital sores, hematuria, menstrual problem, pelvic pain, vaginal bleeding, vaginal discharge and vaginal pain.  Psychiatric/Behavioral: Negative.     Per HPI unless specifically indicated above     Objective:    BP 128/70   Pulse (!) 53   Temp 98.4 F (36.9 C)   Wt 169 lb 1  oz (76.7 kg)   SpO2 99%   BMI 28.13 kg/m   Wt Readings from Last 3 Encounters:  03/17/17 169 lb 1 oz (76.7 kg)  02/09/17 169 lb (76.7 kg)  12/13/16 167 lb 12.8 oz (76.1 kg)    Physical Exam  Constitutional: She is oriented to person, place, and time. She appears well-developed and well-nourished. No distress.  HENT:  Head: Normocephalic and atraumatic.  Right Ear: Hearing normal.  Left Ear: Hearing normal.  Nose: Nose normal.  Eyes: Conjunctivae and lids are normal. Right eye exhibits no discharge. Left eye exhibits no discharge. No scleral icterus.  Cardiovascular: Normal rate, regular rhythm, normal heart sounds and intact distal pulses.  Exam reveals no gallop and no friction rub.   No murmur heard. Pulmonary/Chest: Effort normal and breath sounds normal. No respiratory distress. She has no wheezes. She has no rales. She exhibits no tenderness.  Musculoskeletal: Normal range of motion.  Neurological: She is alert and oriented to person, place, and time.  Skin: Skin is warm, dry and intact. No rash noted. No erythema. No pallor.  Psychiatric: She has a normal mood and affect. Her speech is normal and behavior is normal. Judgment and thought content normal. Cognition and memory are normal.  Nursing note and vitals reviewed.   Results for orders placed or performed in visit on 02/09/17  Lipid Panel w/o Chol/HDL Ratio  Result Value Ref Range   Cholesterol, Total 179 100 - 199 mg/dL   Triglycerides 160 (H) 0 - 149 mg/dL   HDL 40 >39 mg/dL   VLDL Cholesterol Cal 32 5 - 40  mg/dL   LDL Calculated 107 (H) 0 - 99 mg/dL  Basic metabolic panel  Result Value Ref Range   Glucose 85 mg/dL   BUN 26 (A) 4 - 21 mg/dL   Creatinine 0.8 0.5 - 1.1 mg/dL   Potassium 3.9 3.4 - 5.3 mmol/L   Sodium 139 137 - 147 mmol/L  Hepatic function panel  Result Value Ref Range   ALT 16 7 - 35 U/L   AST 16 13 - 35 U/L   Bilirubin, Total 0.7 mg/dL  CBC and differential  Result Value Ref Range    Hemoglobin 10.4 (A) 12.0 - 16.0 g/dL   HCT 33 (A) 36 - 46 %   Platelets 139 (A) 150 - 399 K/L   WBC 2.8 10^3/mL  TSH  Result Value Ref Range   TSH 1.40 0.41 - 5.90 uIU/mL      Assessment & Plan:   Problem List Items Addressed This Visit      Cardiovascular and Mediastinum   Atrial fibrillation (HCC)    Stable. Continue to follow with cardiology. Continue to monitor.         Other   Iron deficiency anemia - Primary    Rechecking levels today. Await results. Due to see hematology in about a month. Call with any concerns.       Relevant Orders   CBC with Differential/Platelet    Other Visit Diagnoses    Dysuria       UA negative. Call with any concerns.    Relevant Orders   UA/M w/rflx Culture, Routine       Follow up plan: Return in about 5 months (around 08/17/2017) for Follow up .

## 2017-03-17 NOTE — Assessment & Plan Note (Signed)
Rechecking levels today. Await results. Due to see hematology in about a month. Call with any concerns.

## 2017-03-18 ENCOUNTER — Encounter: Payer: Self-pay | Admitting: Family Medicine

## 2017-03-18 LAB — CBC WITH DIFFERENTIAL/PLATELET
BASOS: 0 %
Basophils Absolute: 0 10*3/uL (ref 0.0–0.2)
EOS (ABSOLUTE): 0.1 10*3/uL (ref 0.0–0.4)
Eos: 2 %
Hematocrit: 33.2 % — ABNORMAL LOW (ref 34.0–46.6)
Hemoglobin: 10.6 g/dL — ABNORMAL LOW (ref 11.1–15.9)
IMMATURE GRANULOCYTES: 0 %
Immature Grans (Abs): 0 10*3/uL (ref 0.0–0.1)
Lymphocytes Absolute: 0.7 10*3/uL (ref 0.7–3.1)
Lymphs: 18 %
MCH: 26.2 pg — AB (ref 26.6–33.0)
MCHC: 31.9 g/dL (ref 31.5–35.7)
MCV: 82 fL (ref 79–97)
MONOS ABS: 0.2 10*3/uL (ref 0.1–0.9)
Monocytes: 6 %
NEUTROS PCT: 74 %
Neutrophils Absolute: 2.8 10*3/uL (ref 1.4–7.0)
PLATELETS: 162 10*3/uL (ref 150–379)
RBC: 4.04 x10E6/uL (ref 3.77–5.28)
RDW: 16.8 % — AB (ref 12.3–15.4)
WBC: 3.8 10*3/uL (ref 3.4–10.8)

## 2017-04-11 ENCOUNTER — Inpatient Hospital Stay: Payer: Medicare Other | Attending: Internal Medicine

## 2017-04-11 DIAGNOSIS — D509 Iron deficiency anemia, unspecified: Secondary | ICD-10-CM | POA: Insufficient documentation

## 2017-04-11 DIAGNOSIS — D638 Anemia in other chronic diseases classified elsewhere: Secondary | ICD-10-CM

## 2017-04-11 LAB — CBC WITH DIFFERENTIAL/PLATELET
BASOS ABS: 0 10*3/uL (ref 0–0.1)
BASOS PCT: 1 %
EOS ABS: 0.1 10*3/uL (ref 0–0.7)
EOS PCT: 3 %
HCT: 30.8 % — ABNORMAL LOW (ref 35.0–47.0)
Hemoglobin: 10.3 g/dL — ABNORMAL LOW (ref 12.0–16.0)
LYMPHS PCT: 23 %
Lymphs Abs: 0.7 10*3/uL — ABNORMAL LOW (ref 1.0–3.6)
MCH: 26.6 pg (ref 26.0–34.0)
MCHC: 33.4 g/dL (ref 32.0–36.0)
MCV: 79.8 fL — AB (ref 80.0–100.0)
MONO ABS: 0.2 10*3/uL (ref 0.2–0.9)
Monocytes Relative: 7 %
Neutro Abs: 2 10*3/uL (ref 1.4–6.5)
Neutrophils Relative %: 66 %
PLATELETS: 174 10*3/uL (ref 150–440)
RBC: 3.86 MIL/uL (ref 3.80–5.20)
RDW: 17 % — AB (ref 11.5–14.5)
WBC: 3.1 10*3/uL — ABNORMAL LOW (ref 3.6–11.0)

## 2017-04-11 LAB — BASIC METABOLIC PANEL
Anion gap: 6 (ref 5–15)
BUN: 36 mg/dL — AB (ref 6–20)
CALCIUM: 9.5 mg/dL (ref 8.9–10.3)
CO2: 28 mmol/L (ref 22–32)
CREATININE: 1.09 mg/dL — AB (ref 0.44–1.00)
Chloride: 104 mmol/L (ref 101–111)
GFR calc Af Amer: >60 mL/min (ref >60)
GFR, EST NON AFRICAN AMERICAN: 54 mL/min — AB (ref >60)
GLUCOSE: 100 mg/dL — AB (ref 65–99)
Potassium: 4.1 mmol/L (ref 3.5–5.1)
Sodium: 138 mmol/L (ref 135–145)

## 2017-04-11 LAB — IRON AND TIBC
Iron: 30 ug/dL (ref 28–170)
SATURATION RATIOS: 10 % — AB (ref 10.4–31.8)
TIBC: 300 ug/dL (ref 250–450)
UIBC: 270 ug/dL

## 2017-04-11 LAB — FERRITIN: Ferritin: 51 ng/mL (ref 11–307)

## 2017-04-11 LAB — RETICULOCYTES
RBC.: 3.94 MIL/uL (ref 3.80–5.20)
RETIC COUNT ABSOLUTE: 43.3 10*3/uL (ref 19.0–183.0)
RETIC CT PCT: 1.1 % (ref 0.4–3.1)

## 2017-04-12 LAB — HEPATITIS B SURFACE ANTIGEN: HEP B S AG: NEGATIVE

## 2017-04-12 LAB — HEPATITIS B CORE ANTIBODY, IGM: HEP B C IGM: NEGATIVE

## 2017-04-12 LAB — HIV ANTIBODY (ROUTINE TESTING W REFLEX): HIV Screen 4th Generation wRfx: NONREACTIVE

## 2017-04-12 LAB — HEPATITIS C ANTIBODY: HCV Ab: <0.1 s/co ratio (ref 0.0–0.9)

## 2017-04-13 LAB — COPPER, SERUM: COPPER: 129 ug/dL (ref 72–166)

## 2017-04-13 LAB — ZINC: ZINC: 64 ug/dL (ref 56–134)

## 2017-04-18 ENCOUNTER — Inpatient Hospital Stay: Payer: Medicare Other | Admitting: Internal Medicine

## 2017-04-18 NOTE — Progress Notes (Deleted)
[place] CONSULT NOTE  Patient Care Team: Valerie Roys, DO as PCP - General (Family Medicine) Ubaldo Glassing Javier Docker, MD as Consulting Physician (Cardiology) Carloyn Manner, MD as Referring Physician (Otolaryngology)  CHIEF COMPLAINTS/PURPOSE OF CONSULTATION:   # Anemia of chronic disease- EGD/ Colo [Dr.Wohl; 2017] chronic back pain [? 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    No history exists.     HISTORY OF PRESENTING ILLNESS:  Kristina Henson 61 y.o.  female noted Noted to have anemia of Chronic disease [? Etiology]-is here to review the workup/ also reviewed the results of the MRI that was ordered for a liver lesion.  Patient continues to complain of back pain- however significant improvement post physical therapy. Patient complains of fatigue. Shortness of breath on exertion- chronic mild. No blood in stools or black stools.   ROS: A complete 10 point review of system is done which is negative except mentioned above in history of present illness  MEDICAL HISTORY:  Past Medical History:  Diagnosis Date  . Anemia   . Arthritis    BACK-LUMBAR  . Atrial fibrillation ([place])   . Coronary artery disease   . Dysrhythmia   . Hypertension   . Mass in neck    lt side  . Pneumonia 2016  . Stroke ([place]) 2012  . Stroke [facility]) 2012    SURGICAL HISTORY: Past Surgical History:  Procedure Laterality Date  . ABDOMINAL HYSTERECTOMY     Total  . cardiac catherization    . COLONOSCOPY WITH PROPOFOL N/A 11/18/2015   Procedure: COLONOSCOPY WITH PROPOFOL;  Surgeon: Lucilla Lame, MD;  Location: [facility] ENDOSCOPY;  Service: Endoscopy;  Laterality: N/A;  . CORONARY ANGIOPLASTY    . ESOPHAGOGASTRODUODENOSCOPY (EGD) WITH PROPOFOL N/A 04/23/2016   Procedure: ESOPHAGOGASTRODUODENOSCOPY (EGD) WITH PROPOFOL;  Surgeon: Lucilla Lame, MD;  Location: [place];  Service: Endoscopy;  Laterality: N/A;  small bowel bx gastric antrum bx  . throat biopsy      SOCIAL HISTORY: Social History   Social History  . Marital status: Divorced    Spouse name: N/A  . Number of children: N/A  . Years of education: N/A   Occupational History  . Not on file.   Social History Main Topics  . Smoking status: Never Smoker  . Smokeless tobacco: Never Used  . Alcohol use No  . Drug use: No  . Sexual activity: Yes   Other Topics Concern  . Not on file   Social History Narrative  . No narrative on file    FAMILY HISTORY: Family History  Problem Relation Age of Onset  . Arthritis Mother   . Cancer Mother   . Hypertension Sister   . Asthma Brother   . Breast cancer Neg Hx     ALLERGIES:  is allergic to amoxicillin.  MEDICATIONS:  Current Outpatient Prescriptions  Medication Sig Dispense Refill  . amiodarone (PACERONE) 200 MG tablet Take 200 mg by mouth every morning.     Marland Kitchen aspirin EC 81 MG tablet Take 81 mg by mouth daily.     Marland Kitchen CRANBERRY PO Take 1 tablet by mouth daily.    . ferrous sulfate 325 (65 FE) MG EC tablet Take 1 tablet (325 mg total) by mouth 3 (three) times daily with meals. 90 tablet 1  . hydrALAZINE (  APRESOLINE) 50 MG tablet Take 50 mg by mouth 2 (two) times daily.    Marland Kitchen lisinopril-hydrochlorothiazide (PRINZIDE,ZESTORETIC) 20-25 MG tablet Take 2 tablets by mouth daily. 180 tablet 1   No current facility-administered medications for this visit.       Marland Kitchen  PHYSICAL EXAMINATION: ECOG PERFORMANCE STATUS: 1 - Symptomatic but completely ambulatory  There were no vitals filed for this visit. There were no vitals filed for this visit.  GENERAL: Well-nourished well-developed; Alert, no distress and comfortable.  She is alone.  EYES: no pallor or icterus OROPHARYNX: no thrush or ulceration; good dentition  NECK:  supple.  LYMPH: Approximate 2 cm submandibular mass felt  no palpable lymphadenopathy in the axillary or inguinal regions LUNGS: Decreased breath sounds at the right lower base.  No wheeze or crackles HEART/CVS: regular rate & rhythm and no murmurs; No lower extremity edema ABDOMEN: abdomen soft, non-tender and normal bowel sounds Musculoskeletal:no cyanosis of digits and no clubbing  PSYCH: alert & oriented x 3 with fluent speech NEURO: no focal motor/sensory deficits SKIN:  no rashes or significant lesions  LABORATORY DATA:  I have reviewed the data as listed Lab Results  Component Value Date   WBC 3.1 (L) 04/11/2017   HGB 10.3 (L) 04/11/2017   HCT 30.8 (L) 04/11/2017   MCV 79.8 (L) 04/11/2017   PLT 174 04/11/2017    Recent Labs  04/30/16 1644 05/02/16 1527 08/11/16 0830  12/10/16 1350 02/07/17 04/11/17 1409  NA 137 136 141  --  137 139 138  K 3.7 4.1 4.0  --  3.7 3.9 4.1  CL 102 101 100  --  105  --  104  CO2 27 25 27   --  27  --  28  GLUCOSE 107* 120* 115*  --  109*  --  100*  BUN 36* 26* 21  --  36* 26* 36*  CREATININE 1.12* 0.88 0.91  < > 0.91 0.8 1.09*  CALCIUM 9.3 9.8 9.3  --  9.1  --  9.5  GFRNONAA 53* >60 70  --  >60  --  54*  GFRAA >60 >60 80  --  >60  --  >60  PROT 7.2 7.1 6.9  --   --   --   --   ALBUMIN 3.4* 3.4* 3.9  --   --   --   --   AST 22 22 16   --   --  16  --   ALT 16 19 15   --   --  16  --   ALKPHOS 56 60 83  --   --   --   --   BILITOT 0.8 0.5 0.4  --   --   --   --   < > = values in this interval not displayed.  RADIOGRAPHIC STUDIES: I have personally reviewed the radiological images as listed and agreed with the findings in the report. No results found.  ASSESSMENT & PLAN:   No problem-specific Assessment & Plan notes found for this encounter.  All questions were answered. The patient knows to call the clinic with any problems, questions or concerns.     Cammie Sickle, MD 04/18/2017 8:28 AM

## 2017-04-18 NOTE — Assessment & Plan Note (Deleted)
#  Anemia- likely secondary to anemia of chronic disease [unclear etiology]- bone marrow biopsy negative for any primary bone marrow problems like MDS; cytogenetics normal. Continue PO iron once a day. Today Hb 9.3/Stable.   # Anemia of chronic disease- question etiology; CRP- elevated CRP >4; back pain- improved s/p PT.  # Mild leukopenia white count 2.5/lymphopenia. [BMBx- NEG] Asymptomatic.check HIV/hepatitis/ copper/zinc testing at next visit.   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 No further follow up scans recommended.   # Follow-up in 4 months with labs 1 week prior.  

## 2017-04-27 ENCOUNTER — Inpatient Hospital Stay: Payer: Medicare Other | Attending: Internal Medicine | Admitting: Internal Medicine

## 2017-04-27 VITALS — BP 158/94 | HR 48 | Temp 97.6°F | Resp 20 | Ht 65.0 in | Wt 167.8 lb

## 2017-04-27 DIAGNOSIS — R0602 Shortness of breath: Secondary | ICD-10-CM | POA: Insufficient documentation

## 2017-04-27 DIAGNOSIS — I2510 Atherosclerotic heart disease of native coronary artery without angina pectoris: Secondary | ICD-10-CM | POA: Diagnosis not present

## 2017-04-27 DIAGNOSIS — D638 Anemia in other chronic diseases classified elsewhere: Secondary | ICD-10-CM

## 2017-04-27 DIAGNOSIS — D72810 Lymphocytopenia: Secondary | ICD-10-CM | POA: Insufficient documentation

## 2017-04-27 DIAGNOSIS — D72819 Decreased white blood cell count, unspecified: Secondary | ICD-10-CM | POA: Diagnosis not present

## 2017-04-27 DIAGNOSIS — Z8701 Personal history of pneumonia (recurrent): Secondary | ICD-10-CM | POA: Insufficient documentation

## 2017-04-27 DIAGNOSIS — Z8673 Personal history of transient ischemic attack (TIA), and cerebral infarction without residual deficits: Secondary | ICD-10-CM | POA: Insufficient documentation

## 2017-04-27 DIAGNOSIS — D500 Iron deficiency anemia secondary to blood loss (chronic): Secondary | ICD-10-CM

## 2017-04-27 DIAGNOSIS — K769 Liver disease, unspecified: Secondary | ICD-10-CM | POA: Insufficient documentation

## 2017-04-27 DIAGNOSIS — Z79899 Other long term (current) drug therapy: Secondary | ICD-10-CM | POA: Insufficient documentation

## 2017-04-27 DIAGNOSIS — I4891 Unspecified atrial fibrillation: Secondary | ICD-10-CM | POA: Insufficient documentation

## 2017-04-27 DIAGNOSIS — R221 Localized swelling, mass and lump, neck: Secondary | ICD-10-CM | POA: Insufficient documentation

## 2017-04-27 DIAGNOSIS — Z7982 Long term (current) use of aspirin: Secondary | ICD-10-CM | POA: Diagnosis not present

## 2017-04-27 DIAGNOSIS — I10 Essential (primary) hypertension: Secondary | ICD-10-CM | POA: Insufficient documentation

## 2017-04-27 DIAGNOSIS — M549 Dorsalgia, unspecified: Secondary | ICD-10-CM | POA: Diagnosis not present

## 2017-04-27 NOTE — Assessment & Plan Note (Deleted)
#  Anemia- likely secondary to anemia of chronic disease [unclear etiology]- bone marrow biopsy negative for any primary bone marrow problems like MDS; cytogenetics normal. Continue PO iron once a day. Today Hb 9.3/Stable.   # Anemia of chronic disease- question etiology; CRP- elevated CRP >4; back pain- improved s/p PT.  # Mild leukopenia white count 2.5/lymphopenia. [BMBx- NEG] Asymptomatic.check HIV/hepatitis/ copper/zinc testing at next visit.   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 No further follow up scans recommended.   # Follow-up in 4 months with labs 1 week prior.  

## 2017-04-27 NOTE — Assessment & Plan Note (Addendum)
#   Anemia- likely IDA  [unclear etiology]- Hb ~10; Iron sat 10%; ferritin 15- not improving on iron pills 3 times a day. Question malabsorption.  Recommend IV venofer weekly x4. Discussed the potential reactions to IV iron.  # Mild leukopenia white count 2.5/lymphopenia. [BMBx- NEG] Asymptomatic. Normal- HIV/hepatitis/ copper/zinc.   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

## 2017-04-27 NOTE — Progress Notes (Signed)
[place] CONSULT NOTE  Patient Care Team: Valerie Roys, DO as PCP - General (Family Medicine) Ubaldo Glassing Javier Docker, MD as Consulting Physician (Cardiology) Carloyn Manner, MD as Referring Physician (Otolaryngology)  CHIEF COMPLAINTS/PURPOSE OF CONSULTATION:   # Anemia of chronic disease- EGD/ Colo [Dr.Wohl; 2017] chronic back pain [? 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    No history exists.     HISTORY OF PRESENTING ILLNESS:  Kristina Henson 61 y.o.  female noted Moderate anemia of ? Etiology- Is here for follow-up.   Her back pain is improved since physical therapy.  Patient complains of fatigue. Shortness of breath on exertion- chronic mild. No blood in stools or black stools. She takes iron pills 3 times a day. No constipation. Her neck surgery is on hold because of ongoing anemia.  ROS: A complete 10 point review of system is done which is negative except mentioned above in history of present illness  MEDICAL HISTORY:  Past Medical History:  Diagnosis Date  . Anemia   . Arthritis    BACK-LUMBAR  . Atrial fibrillation ([place])   . Coronary artery disease   . Dysrhythmia   . Hypertension   . Mass in neck    lt side  . Pneumonia 2016  . Stroke ([place]) 2012  . Stroke [facility]) 2012    SURGICAL HISTORY: Past Surgical History:  Procedure Laterality Date  . ABDOMINAL HYSTERECTOMY     Total  . cardiac catherization    . COLONOSCOPY WITH PROPOFOL N/A 11/18/2015   Procedure: COLONOSCOPY WITH PROPOFOL;  Surgeon: Lucilla Lame, MD;  Location: [facility] ENDOSCOPY;  Service: Endoscopy;  Laterality: N/A;  . CORONARY ANGIOPLASTY    . ESOPHAGOGASTRODUODENOSCOPY (EGD) WITH PROPOFOL N/A 04/23/2016   Procedure: ESOPHAGOGASTRODUODENOSCOPY (EGD) WITH  PROPOFOL;  Surgeon: Lucilla Lame, MD;  Location: [place];  Service: Endoscopy;  Laterality: N/A;  small bowel bx gastric antrum bx  . throat biopsy      SOCIAL HISTORY: Social History   Social History  . Marital status: Divorced    Spouse name: N/A  . Number of children: N/A  . Years of education: N/A   Occupational History  . Not on file.   Social History Main Topics  . Smoking status: Never Smoker  . Smokeless tobacco: Never Used  . Alcohol use No  . Drug use: No  . Sexual activity: Yes   Other Topics Concern  . Not on file   Social History Narrative  . No narrative on file    FAMILY HISTORY: Family History  Problem Relation Age of Onset  . Arthritis Mother   . Cancer Mother   . Hypertension Sister   . Asthma Brother   . Breast cancer Neg Hx     ALLERGIES:  is allergic to amoxicillin.  MEDICATIONS:  Current Outpatient Prescriptions  Medication Sig Dispense Refill  . amiodarone (PACERONE) 200 MG tablet Take 200 mg by mouth every morning.     Marland Kitchen aspirin EC 81 MG tablet Take 81 mg by mouth daily.     Marland Kitchen CRANBERRY PO Take 1 tablet by mouth daily.    . ferrous sulfate 325 (65 FE) MG EC tablet Take 1 tablet (325 mg total) by mouth 3 (three) times daily with meals. 90 tablet 1  . hydrALAZINE (APRESOLINE) 50  MG tablet Take 50 mg by mouth 2 (two) times daily.    Marland Kitchen lisinopril-hydrochlorothiazide (PRINZIDE,ZESTORETIC) 20-25 MG tablet Take 2 tablets by mouth daily. 180 tablet 1   No current facility-administered medications for this visit.       Marland Kitchen  PHYSICAL EXAMINATION: ECOG PERFORMANCE STATUS: 1 - Symptomatic but completely ambulatory  Vitals:   04/27/17 1048  BP: (!) 158/94  Pulse: (!) 48  Resp: 20  Temp: 97.6 F (36.4 C)   Filed Weights   04/27/17 1048  Weight: 167 lb 12.8 oz (76.1 kg)    GENERAL: Well-nourished well-developed; Alert, no distress and comfortable.  She is alone.  EYES: no pallor or icterus OROPHARYNX: no thrush or  ulceration; good dentition  NECK: supple.  LYMPH: Approximate 2 cm submandibular mass felt  no palpable lymphadenopathy in the axillary or inguinal regions LUNGS: Decreased breath sounds at the right lower base.  No wheeze or crackles HEART/CVS: regular rate & rhythm and no murmurs; No lower extremity edema ABDOMEN: abdomen soft, non-tender and normal bowel sounds Musculoskeletal:no cyanosis of digits and no clubbing  PSYCH: alert & oriented x 3 with fluent speech NEURO: no focal motor/sensory deficits SKIN:  no rashes or significant lesions  LABORATORY DATA:  I have reviewed the data as listed Lab Results  Component Value Date   WBC 3.1 (L) 04/11/2017   HGB 10.3 (L) 04/11/2017   HCT 30.8 (L) 04/11/2017   MCV 79.8 (L) 04/11/2017   PLT 174 04/11/2017    Recent Labs  04/30/16 1644 05/02/16 1527 08/11/16 0830  12/10/16 1350 02/07/17 04/11/17 1409  NA 137 136 141  --  137 139 138  K 3.7 4.1 4.0  --  3.7 3.9 4.1  CL 102 101 100  --  105  --  104  CO2 27 25 27   --  27  --  28  GLUCOSE 107* 120* 115*  --  109*  --  100*  BUN 36* 26* 21  --  36* 26* 36*  CREATININE 1.12* 0.88 0.91  < > 0.91 0.8 1.09*  CALCIUM 9.3 9.8 9.3  --  9.1  --  9.5  GFRNONAA 53* >60 70  --  >60  --  54*  GFRAA >60 >60 80  --  >60  --  >60  PROT 7.2 7.1 6.9  --   --   --   --   ALBUMIN 3.4* 3.4* 3.9  --   --   --   --   AST 22 22 16   --   --  16  --   ALT 16 19 15   --   --  16  --   ALKPHOS 56 60 83  --   --   --   --   BILITOT 0.8 0.5 0.4  --   --   --   --   < > = values in this interval not displayed.  RADIOGRAPHIC STUDIES: I have personally reviewed the radiological images as listed and agreed with the findings in the report. No results found.  ASSESSMENT & PLAN:   Iron deficiency anemia due to chronic blood loss # Anemia- likely IDA  [unclear etiology]- Hb ~10; Iron sat 10%; ferritin 15- not improving on iron pills 3 times a day. Question malabsorption.  Recommend IV venofer weekly x4.  Discussed the potential reactions to IV iron.  # Mild leukopenia white count 2.5/lymphopenia. [BMBx- NEG] Asymptomatic. Normal- HIV/hepatitis/ copper/zinc.   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  All questions were answered. The patient knows to call the clinic with any problems, questions or concerns.     Cammie Sickle, MD 04/27/2017 1:35 PM

## 2017-04-29 ENCOUNTER — Ambulatory Visit: Payer: Medicare Other

## 2017-05-03 ENCOUNTER — Inpatient Hospital Stay: Payer: Medicare Other

## 2017-05-03 VITALS — BP 125/72 | HR 67 | Temp 97.4°F | Resp 20

## 2017-05-03 DIAGNOSIS — R221 Localized swelling, mass and lump, neck: Secondary | ICD-10-CM | POA: Diagnosis not present

## 2017-05-03 DIAGNOSIS — D500 Iron deficiency anemia secondary to blood loss (chronic): Secondary | ICD-10-CM

## 2017-05-03 DIAGNOSIS — M549 Dorsalgia, unspecified: Secondary | ICD-10-CM | POA: Diagnosis not present

## 2017-05-03 DIAGNOSIS — D72819 Decreased white blood cell count, unspecified: Secondary | ICD-10-CM | POA: Diagnosis not present

## 2017-05-03 DIAGNOSIS — D72810 Lymphocytopenia: Secondary | ICD-10-CM | POA: Diagnosis not present

## 2017-05-03 DIAGNOSIS — Z79899 Other long term (current) drug therapy: Secondary | ICD-10-CM | POA: Diagnosis not present

## 2017-05-03 MED ORDER — SODIUM CHLORIDE 0.9 % IV SOLN
Freq: Once | INTRAVENOUS | Status: AC
  Administered 2017-05-03: 14:00:00 via INTRAVENOUS
  Filled 2017-05-03: qty 1000

## 2017-05-03 MED ORDER — IRON SUCROSE 20 MG/ML IV SOLN
200.0000 mg | Freq: Once | INTRAVENOUS | Status: AC
  Administered 2017-05-03: 200 mg via INTRAVENOUS
  Filled 2017-05-03: qty 10

## 2017-05-03 MED ORDER — IRON SUCROSE 20 MG/ML IV SOLN: 200.0000 mg | Freq: Once | INTRAVENOUS | Status: DC

## 2017-05-06 ENCOUNTER — Ambulatory Visit: Payer: Medicare Other

## 2017-05-10 ENCOUNTER — Inpatient Hospital Stay: Payer: Medicare Other

## 2017-05-10 VITALS — BP 132/76 | HR 65 | Temp 96.5°F | Resp 20

## 2017-05-10 DIAGNOSIS — D500 Iron deficiency anemia secondary to blood loss (chronic): Secondary | ICD-10-CM

## 2017-05-10 DIAGNOSIS — R221 Localized swelling, mass and lump, neck: Secondary | ICD-10-CM | POA: Diagnosis not present

## 2017-05-10 DIAGNOSIS — D72810 Lymphocytopenia: Secondary | ICD-10-CM | POA: Diagnosis not present

## 2017-05-10 DIAGNOSIS — M549 Dorsalgia, unspecified: Secondary | ICD-10-CM | POA: Diagnosis not present

## 2017-05-10 DIAGNOSIS — D72819 Decreased white blood cell count, unspecified: Secondary | ICD-10-CM | POA: Diagnosis not present

## 2017-05-10 DIAGNOSIS — Z79899 Other long term (current) drug therapy: Secondary | ICD-10-CM | POA: Diagnosis not present

## 2017-05-10 MED ORDER — SODIUM CHLORIDE 0.9 % IV SOLN
Freq: Once | INTRAVENOUS | Status: AC
  Administered 2017-05-10: 14:00:00 via INTRAVENOUS
  Filled 2017-05-10: qty 1000

## 2017-05-10 MED ORDER — SODIUM CHLORIDE 0.9 % IV SOLN: 200.0000 mg | Freq: Once | INTRAVENOUS | Status: DC

## 2017-05-10 MED ORDER — IRON SUCROSE 20 MG/ML IV SOLN
200.0000 mg | Freq: Once | INTRAVENOUS | Status: AC
  Administered 2017-05-10: 200 mg via INTRAVENOUS
  Filled 2017-05-10: qty 10

## 2017-05-13 ENCOUNTER — Ambulatory Visit: Payer: Medicare Other

## 2017-05-17 ENCOUNTER — Inpatient Hospital Stay: Payer: Medicare Other

## 2017-05-17 VITALS — BP 154/85 | HR 59 | Temp 96.9°F | Resp 18

## 2017-05-17 DIAGNOSIS — D500 Iron deficiency anemia secondary to blood loss (chronic): Secondary | ICD-10-CM

## 2017-05-17 DIAGNOSIS — D72819 Decreased white blood cell count, unspecified: Secondary | ICD-10-CM | POA: Diagnosis not present

## 2017-05-17 DIAGNOSIS — D72810 Lymphocytopenia: Secondary | ICD-10-CM | POA: Diagnosis not present

## 2017-05-17 DIAGNOSIS — Z79899 Other long term (current) drug therapy: Secondary | ICD-10-CM | POA: Diagnosis not present

## 2017-05-17 DIAGNOSIS — M549 Dorsalgia, unspecified: Secondary | ICD-10-CM | POA: Diagnosis not present

## 2017-05-17 DIAGNOSIS — R221 Localized swelling, mass and lump, neck: Secondary | ICD-10-CM | POA: Diagnosis not present

## 2017-05-17 MED ORDER — SODIUM CHLORIDE 0.9 % IV SOLN
Freq: Once | INTRAVENOUS | Status: AC
  Administered 2017-05-17: 14:00:00 via INTRAVENOUS
  Filled 2017-05-17: qty 1000

## 2017-05-17 MED ORDER — IRON SUCROSE 20 MG/ML IV SOLN
200.0000 mg | Freq: Once | INTRAVENOUS | Status: AC
  Administered 2017-05-17: 200 mg via INTRAVENOUS
  Filled 2017-05-17: qty 10

## 2017-05-17 MED ORDER — SODIUM CHLORIDE 0.9 % IV SOLN: 200.0000 mg | Freq: Once | INTRAVENOUS | Status: DC

## 2017-05-20 ENCOUNTER — Ambulatory Visit: Payer: Medicare Other

## 2017-05-24 ENCOUNTER — Inpatient Hospital Stay: Payer: Medicare Other

## 2017-05-24 VITALS — BP 121/72 | HR 62 | Temp 97.6°F | Resp 18

## 2017-05-24 DIAGNOSIS — R221 Localized swelling, mass and lump, neck: Secondary | ICD-10-CM | POA: Diagnosis not present

## 2017-05-24 DIAGNOSIS — D72819 Decreased white blood cell count, unspecified: Secondary | ICD-10-CM | POA: Diagnosis not present

## 2017-05-24 DIAGNOSIS — D72810 Lymphocytopenia: Secondary | ICD-10-CM | POA: Diagnosis not present

## 2017-05-24 DIAGNOSIS — Z79899 Other long term (current) drug therapy: Secondary | ICD-10-CM | POA: Diagnosis not present

## 2017-05-24 DIAGNOSIS — M549 Dorsalgia, unspecified: Secondary | ICD-10-CM | POA: Diagnosis not present

## 2017-05-24 DIAGNOSIS — D500 Iron deficiency anemia secondary to blood loss (chronic): Secondary | ICD-10-CM

## 2017-05-24 MED ORDER — SODIUM CHLORIDE 0.9 % IV SOLN
Freq: Once | INTRAVENOUS | Status: AC
  Administered 2017-05-24: 14:00:00 via INTRAVENOUS
  Filled 2017-05-24: qty 1000

## 2017-05-24 MED ORDER — SODIUM CHLORIDE 0.9 % IV SOLN: 200.0000 mg | Freq: Once | INTRAVENOUS | Status: DC

## 2017-05-24 MED ORDER — IRON SUCROSE 20 MG/ML IV SOLN
200.0000 mg | Freq: Once | INTRAVENOUS | Status: AC
  Administered 2017-05-24: 200 mg via INTRAVENOUS
  Filled 2017-05-24: qty 10

## 2017-05-24 NOTE — Addendum Note (Signed)
Addended by: Charlyn Minerva on: 05/24/2017 01:29 PM   Modules accepted: Orders

## 2017-06-02 ENCOUNTER — Ambulatory Visit (INDEPENDENT_AMBULATORY_CARE_PROVIDER_SITE_OTHER): Payer: Medicare Other | Admitting: Family Medicine

## 2017-06-02 ENCOUNTER — Encounter: Payer: Self-pay | Admitting: Family Medicine

## 2017-06-02 VITALS — BP 137/82 | HR 57 | Temp 98.4°F | Wt 169.0 lb

## 2017-06-02 DIAGNOSIS — R399 Unspecified symptoms and signs involving the genitourinary system: Secondary | ICD-10-CM | POA: Diagnosis not present

## 2017-06-02 DIAGNOSIS — N390 Urinary tract infection, site not specified: Secondary | ICD-10-CM

## 2017-06-02 MED ORDER — SULFAMETHOXAZOLE-TRIMETHOPRIM 800-160 MG PO TABS: 1.0000 | ORAL_TABLET | Freq: Two times a day (BID) | ORAL | 0 refills | Status: DC

## 2017-06-02 NOTE — Progress Notes (Signed)
BP 137/82   Pulse (!) 57   Temp 98.4 F (36.9 C)   Wt 169 lb (76.7 kg)   SpO2 98%   BMI 28.12 kg/m    Subjective:    Patient ID: Kristina Henson, female    DOB: 05/17/1956, 61 y.o.   MRN: [id number]  HPI: Kristina Henson is a 61 y.o. female  Chief Complaint  Patient presents with  . Urinary Tract Infection    x 2 days, urine has an odor, frequency, some urgency, low back pain, just hasn't felt well. No dysuria.    2 day hx of urinary frequency, b/l low back aches. Has not tried anything OTC for symptoms Denies fever, chills, N/V, dysuria, hematuria. Does have a hx of UTIs.   Relevant past medical, surgical, family and social history reviewed and updated as indicated. Interim medical history since our last visit reviewed. Allergies and medications reviewed and updated.  Review of Systems  Constitutional: Negative.   HENT: Negative.   Respiratory: Negative.   Cardiovascular: Negative.   Gastrointestinal: Negative.   Genitourinary: Positive for frequency.  Musculoskeletal: Positive for back pain.  Neurological: Negative.   Psychiatric/Behavioral: Negative.    Per HPI unless specifically indicated above     Objective:    BP 137/82   Pulse (!) 57   Temp 98.4 F (36.9 C)   Wt 169 lb (76.7 kg)   SpO2 98%   BMI 28.12 kg/m   Wt Readings from Last 3 Encounters:  06/02/17 169 lb (76.7 kg)  04/27/17 167 lb 12.8 oz (76.1 kg)  03/17/17 169 lb 1 oz (76.7 kg)    Physical Exam  Constitutional: She is oriented to person, place, and time. She appears well-developed and well-nourished. No distress.  HENT:  Head: Atraumatic.  Eyes: Pupils are equal, round, and reactive to light. Conjunctivae are normal.  Neck: Normal range of motion. Neck supple.  Cardiovascular: Normal rate and normal heart sounds.   Pulmonary/Chest: Effort normal and breath sounds normal. No respiratory distress.  Abdominal: Soft. Bowel sounds are normal. She exhibits no distension. There is no  tenderness.  Musculoskeletal: Normal range of motion. She exhibits no tenderness (No CVA tenderness).  Neurological: She is alert and oriented to person, place, and time.  Skin: Skin is warm and dry.  Psychiatric: She has a normal mood and affect. Her behavior is normal.  Nursing note and vitals reviewed.   Results for orders placed or performed in visit on 06/02/17  Microscopic Examination  Result Value Ref Range   WBC, UA 11-30 (A) 0 - 5 /hpf   RBC, UA 0-2 0 - 2 /hpf   Epithelial Cells (non renal) 0-10 0 - 10 /hpf   Renal Epithel, UA 0-10 (A) None seen /hpf   Bacteria, UA Moderate (A) None seen/Few  Urine Culture, Reflex  Result Value Ref Range   Urine Culture, Routine WILL FOLLOW   UA/M w/rflx Culture, Routine (STAT)  Result Value Ref Range   Specific Gravity, UA 1.025 1.005 - 1.030   pH, UA 5.5 5.0 - 7.5   Color, UA Yellow Yellow   Appearance Ur Turbid (A) Clear   Leukocytes, UA 1+ (A) Negative   Protein, UA Trace (A) Negative/Trace   Glucose, UA Negative Negative   Ketones, UA Negative Negative   RBC, UA Negative Negative   Bilirubin, UA Negative Negative   Urobilinogen, Ur 0.2 0.2 - 1.0 mg/dL   Nitrite, UA Positive (A) Negative   Microscopic Examination See below:  Urinalysis Reflex Comment       Assessment & Plan:   Problem List Items Addressed This Visit    None    Visit Diagnoses    Acute lower UTI    -  Primary   U/A +, will start bactrim and push fluids. Await cx. F/u if worsening or no improvement in meantime   Relevant Medications   sulfamethoxazole-trimethoprim (BACTRIM DS,SEPTRA DS) 800-160 MG tablet   Other Relevant Orders   UA/M w/rflx Culture, Routine (STAT) (Completed)       Follow up plan: Return if symptoms worsen or fail to improve.

## 2017-06-02 NOTE — Patient Instructions (Signed)
Follow up as needed

## 2017-06-06 ENCOUNTER — Encounter: Payer: Self-pay | Admitting: Family Medicine

## 2017-06-06 LAB — UA/M W/RFLX CULTURE, ROUTINE
Bilirubin, UA: NEGATIVE
GLUCOSE, UA: NEGATIVE
Ketones, UA: NEGATIVE
Nitrite, UA: POSITIVE — AB
PH UA: 5.5 (ref 5.0–7.5)
RBC, UA: NEGATIVE
SPEC GRAV UA: 1.025 (ref 1.005–1.030)
Urobilinogen, Ur: 0.2 mg/dL (ref 0.2–1.0)

## 2017-06-06 LAB — URINE CULTURE, REFLEX

## 2017-06-06 LAB — MICROSCOPIC EXAMINATION

## 2017-06-16 ENCOUNTER — Ambulatory Visit (INDEPENDENT_AMBULATORY_CARE_PROVIDER_SITE_OTHER): Payer: Medicare Other | Admitting: Family Medicine

## 2017-06-16 ENCOUNTER — Encounter: Payer: Self-pay | Admitting: Family Medicine

## 2017-06-16 VITALS — BP 160/80 | HR 80 | Temp 98.9°F | Wt 171.2 lb

## 2017-06-16 DIAGNOSIS — I129 Hypertensive chronic kidney disease with stage 1 through stage 4 chronic kidney disease, or unspecified chronic kidney disease: Secondary | ICD-10-CM | POA: Diagnosis not present

## 2017-06-16 DIAGNOSIS — N390 Urinary tract infection, site not specified: Secondary | ICD-10-CM

## 2017-06-16 DIAGNOSIS — Z87440 Personal history of urinary (tract) infections: Secondary | ICD-10-CM

## 2017-06-16 DIAGNOSIS — Z23 Encounter for immunization: Secondary | ICD-10-CM | POA: Diagnosis not present

## 2017-06-16 DIAGNOSIS — R300 Dysuria: Secondary | ICD-10-CM

## 2017-06-16 MED ORDER — AMLODIPINE BESYLATE 5 MG PO TABS: 5.0000 mg | ORAL_TABLET | Freq: Every day | ORAL | 3 refills | Status: DC

## 2017-06-16 MED ORDER — CIPROFLOXACIN HCL 500 MG PO TABS: 500.0000 mg | ORAL_TABLET | Freq: Two times a day (BID) | ORAL | 0 refills | Status: DC

## 2017-06-16 NOTE — Assessment & Plan Note (Signed)
Not under good control. Will continue lisinopril-HCTZ and add amlodipine 5mg . Recheck 1 month. Call with any concerns.

## 2017-06-16 NOTE — Patient Instructions (Addendum)
[facility]- urine doctor (they will call you) [facility], 1st floor, [place], [place], [place] [address]  Phone: [phone]   Influenza (Flu) Vaccine (Inactivated or Recombinant): What You Need to Know 1. Why get vaccinated? Influenza ("flu") is a contagious disease that spreads around the [place] every year, usually between October and May. Flu is caused by influenza viruses, and is spread mainly by coughing, sneezing, and close contact. Anyone can get flu. Flu strikes suddenly and can last several days. Symptoms vary by age, but can include:  fever/chills  sore throat  muscle aches  fatigue  cough  headache  runny or stuffy nose  Flu can also lead to pneumonia and blood infections, and cause diarrhea and seizures in children. If you have a medical condition, such as heart or lung disease, flu can make it worse. Flu is more dangerous for some people. Infants and young children, people 57 years of age and older, pregnant women, and people with certain health conditions or a weakened immune system are at greatest risk. Each year thousands of people in the [place] die from flu, and many more are hospitalized. Flu vaccine can:  keep you from getting flu,  make flu less severe if you do get it, and  keep you from spreading flu to your family and other people. 2. Inactivated and recombinant flu vaccines A dose of flu vaccine is recommended every flu season. Children 6 months through 3 years of age may need two doses during the same flu season. Everyone else needs only one dose each flu season. Some inactivated flu vaccines contain a very small amount of a mercury-based preservative called thimerosal. Studies have not shown thimerosal in vaccines to be harmful, but flu vaccines that do not contain thimerosal are available. There is no live flu virus in flu shots. They cannot cause the flu. There are many flu viruses, and they are always  changing. Each year a new flu vaccine is made to protect against three or four viruses that are likely to cause disease in the upcoming flu season. But even when the vaccine doesn't exactly match these viruses, it may still provide some protection. Flu vaccine cannot prevent:  flu that is caused by a virus not covered by the vaccine, or  illnesses that look like flu but are not.  It takes about 2 weeks for protection to develop after vaccination, and protection lasts through the flu season. 3. Some people should not get this vaccine Tell the person who is giving you the vaccine:  If you have any severe, life-threatening allergies. If you ever had a life-threatening allergic reaction after a dose of flu vaccine, or have a severe allergy to any part of this vaccine, you may be advised not to get vaccinated. Most, but not all, types of flu vaccine contain a small amount of egg protein.  If you ever had Guillain-Barr Syndrome (also called GBS). Some people with a history of GBS should not get this vaccine. This should be discussed with your doctor.  If you are not feeling well. It is usually okay to get flu vaccine when you have a mild illness, but you might be asked to come back when you feel better.  4. Risks of a vaccine reaction With any medicine, including vaccines, there is a chance of reactions. These are usually mild and go away on their own, but serious reactions are also possible. Most people who get a flu shot do not  have any problems with it. Minor problems following a flu shot include:  soreness, redness, or swelling where the shot was given  hoarseness  sore, red or itchy eyes  cough  fever  aches  headache  itching  fatigue  If these problems occur, they usually begin soon after the shot and last 1 or 2 days. More serious problems following a flu shot can include the following:  There may be a small increased risk of Guillain-Barre Syndrome (GBS) after  inactivated flu vaccine. This risk has been estimated at 1 or 2 additional cases per million people vaccinated. This is much lower than the risk of severe complications from flu, which can be prevented by flu vaccine.  Young children who get the flu shot along with pneumococcal vaccine (PCV13) and/or DTaP vaccine at the same time might be slightly more likely to have a seizure caused by fever. Ask your doctor for more information. Tell your doctor if a child who is getting flu vaccine has ever had a seizure.  Problems that could happen after any injected vaccine:  People sometimes faint after a medical procedure, including vaccination. Sitting or lying down for about 15 minutes can help prevent fainting, and injuries caused by a fall. Tell your doctor if you feel dizzy, or have vision changes or ringing in the ears.  Some people get severe pain in the shoulder and have difficulty moving the arm where a shot was given. This happens very rarely.  Any medication can cause a severe allergic reaction. Such reactions from a vaccine are very rare, estimated at about 1 in a million doses, and would happen within a few minutes to a few hours after the vaccination. As with any medicine, there is a very remote chance of a vaccine causing a serious injury or death. The safety of vaccines is always being monitored. For more information, visit: http://www.aguilar.org/ 5. What if there is a serious reaction? What should I look for? Look for anything that concerns you, such as signs of a severe allergic reaction, very high fever, or unusual behavior. Signs of a severe allergic reaction can include hives, swelling of the face and throat, difficulty breathing, a fast heartbeat, dizziness, and weakness. These would start a few minutes to a few hours after the vaccination. What should I do?  If you think it is a severe allergic reaction or other emergency that can't wait, call 9-1-1 and get the person to the  nearest hospital. Otherwise, call your doctor.  Reactions should be reported to the Vaccine Adverse Event Reporting System (VAERS). Your doctor should file this report, or you can do it yourself through the VAERS web site at www.vaers.SamedayNews.es, or by calling 267-211-2890. ? VAERS does not give medical advice. 6. The National Vaccine Injury Compensation Program The [employer] Vaccine Injury Compensation Program (VICP) is a federal program that was created to compensate people who may have been injured by certain vaccines. Persons who believe they may have been injured by a vaccine can learn about the program and about filing a claim by calling 769 833 9240 or visiting the [place] website at GoldCloset.com.ee. There is a time limit to file a claim for compensation. 7. How can I learn more?  Ask your healthcare provider. He or she can give you the vaccine package insert or suggest other sources of information.  Call your local or state health department.  Contact the Centers for Disease Control and Prevention (CDC): ? Call (941) 061-3005 (1-800-CDC-INFO) or ? Visit CDC's website at  https://gibson.com/ Vaccine Information Statement, Inactivated Influenza Vaccine (05/03/2014) This information is not intended to replace advice given to you by your health care provider. Make sure you discuss any questions you have with your health care provider. Document Released: 07/08/2006 Document Revised: 06/03/2016 Document Reviewed: 06/03/2016 Elsevier Interactive Patient Education  2017 [employer].

## 2017-06-16 NOTE — Progress Notes (Signed)
BP (!) 160/80   Pulse 80   Temp 98.9 F (37.2 C)   Wt 171 lb 3.2 oz (77.7 kg)   SpO2 98%   BMI 28.49 kg/m    Subjective:    Patient ID: Kristina Henson, female    DOB: 10-21-1955, 61 y.o.   MRN: [id number]  HPI: Kristina Henson is a 61 y.o. female  Chief Complaint  Patient presents with  . Urinary Tract Infection    pt states she has been having urinary frequency, urine odor, and low back pain. States she was treated a few weeks ago, thought she was getting better, but has started having symptoms again.    URINARY SYMPTOMS Duration: 2 weeks ago she had a UTI and started to feel better, but then it came back a couple of days ago and had another  Dysuria: no Urinary frequency: yes Urgency: yes Small volume voids: yes Symptom severity: moderate Urinary incontinence: no Foul odor: yes Hematuria: no Abdominal pain: no Back pain: yes Suprapubic pain/pressure: no Flank pain: yes Fever:  no Vomiting: no Relief with cranberry juice: no Relief with pyridium: no Status: stable Previous urinary tract infection: yes Recurrent urinary tract infection: yes History of sexually transmitted disease: no Vaginal discharge: no Treatments attempted: antibiotics and increasing fluids    Relevant past medical, surgical, family and social history reviewed and updated as indicated. Interim medical history since our last visit reviewed. Allergies and medications reviewed and updated.  Review of Systems  Constitutional: Negative.   Respiratory: Negative.   Cardiovascular: Negative.   Genitourinary: Positive for flank pain, frequency and urgency. Negative for decreased urine volume, difficulty urinating, dyspareunia, dysuria, enuresis, genital sores, hematuria, menstrual problem, pelvic pain, vaginal bleeding, vaginal discharge and vaginal pain.  Psychiatric/Behavioral: Negative.     Per HPI unless specifically indicated above     Objective:    BP (!) 160/80   Pulse 80   Temp  98.9 F (37.2 C)   Wt 171 lb 3.2 oz (77.7 kg)   SpO2 98%   BMI 28.49 kg/m   Wt Readings from Last 3 Encounters:  06/16/17 171 lb 3.2 oz (77.7 kg)  06/02/17 169 lb (76.7 kg)  04/27/17 167 lb 12.8 oz (76.1 kg)    Physical Exam  Constitutional: She is oriented to person, place, and time. She appears well-developed and well-nourished. No distress.  HENT:  Head: Normocephalic and atraumatic.  Right Ear: Hearing normal.  Left Ear: Hearing normal.  Nose: Nose normal.  Eyes: Conjunctivae and lids are normal. Right eye exhibits no discharge. Left eye exhibits no discharge. No scleral icterus.  Cardiovascular: Normal rate, regular rhythm, normal heart sounds and intact distal pulses.  Exam reveals no gallop and no friction rub.   No murmur heard. Pulmonary/Chest: Effort normal and breath sounds normal. No respiratory distress. She has no wheezes. She has no rales. She exhibits no tenderness.  Abdominal: Soft. Bowel sounds are normal. She exhibits no distension and no mass. There is no tenderness. There is no rebound and no guarding.  Musculoskeletal: Normal range of motion.  Neurological: She is alert and oriented to person, place, and time.  Skin: Skin is warm, dry and intact. No rash noted. She is not diaphoretic. No erythema. No pallor.  Psychiatric: She has a normal mood and affect. Her speech is normal and behavior is normal. Judgment and thought content normal. Cognition and memory are normal.  Nursing note and vitals reviewed.   Results for orders placed or performed  in visit on 06/16/17  Microscopic Examination  Result Value Ref Range   WBC, UA 11-30 (A) 0 - 5 /hpf   RBC, UA None seen 0 - 2 /hpf   Epithelial Cells (non renal) 0-10 0 - 10 /hpf   Bacteria, UA Many (A) None seen/Few  Urine Culture, Reflex  Result Value Ref Range   Urine Culture, Routine WILL FOLLOW   UA/M w/rflx Culture, Routine  Result Value Ref Range   Specific Gravity, UA 1.020 1.005 - 1.030   pH, UA 5.5 5.0  - 7.5   Color, UA Yellow Yellow   Appearance Ur Turbid (A) Clear   Leukocytes, UA 2+ (A) Negative   Protein, UA Negative Negative/Trace   Glucose, UA Negative Negative   Ketones, UA Negative Negative   RBC, UA Negative Negative   Bilirubin, UA Negative Negative   Urobilinogen, Ur 0.2 0.2 - 1.0 mg/dL   Nitrite, UA Positive (A) Negative   Microscopic Examination See below:    Urinalysis Reflex Comment       Assessment & Plan:   Problem List Items Addressed This Visit      Genitourinary   Benign hypertensive renal disease    Not under good control. Will continue lisinopril-HCTZ and add amlodipine 5mg . Recheck 1 month. Call with any concerns.        Other Visit Diagnoses    Acute lower UTI    -  Primary   Will treat with cipro as she just took bactrim. Await culture.    Relevant Orders   Ambulatory referral to Urology   Dysuria       +UA with leuks and nitrate   Relevant Orders   UA/M w/rflx Culture, Routine (Completed)   Need for influenza vaccination       Flu shot given today.   Relevant Orders   Flu Vaccine QUAD 36+ mos IM (Completed)   History of recurrent UTIs       Numerous UTIs in the last year. Will get her into urology for evaluation to see if anything can be done. Referral generated today.   Relevant Orders   Ambulatory referral to Urology       Follow up plan: Return in about 4 weeks (around 07/14/2017) for BP follow up.

## 2017-06-20 LAB — MICROSCOPIC EXAMINATION: RBC MICROSCOPIC, UA: NONE SEEN /HPF (ref 0–?)

## 2017-06-20 LAB — UA/M W/RFLX CULTURE, ROUTINE
BILIRUBIN UA: NEGATIVE
GLUCOSE, UA: NEGATIVE
KETONES UA: NEGATIVE
NITRITE UA: POSITIVE — AB
PROTEIN UA: NEGATIVE
RBC UA: NEGATIVE
Specific Gravity, UA: 1.02 (ref 1.005–1.030)
UUROB: 0.2 mg/dL (ref 0.2–1.0)
pH, UA: 5.5 (ref 5.0–7.5)

## 2017-06-20 LAB — URINE CULTURE, REFLEX

## 2017-06-27 NOTE — Progress Notes (Signed)
06/29/2017 10:06 AM   Kristina Henson [birth year] [id number]  Referring provider: Valerie Roys, DO [place], [place] [address]  Chief Complaint  Patient presents with  . New Patient (Initial Visit)    recurrent uti referred by Dr. Park Liter    HPI: Patient is a 61 -year-old Caucasian female who is referred to [place] by, Dr. Valerie Roys, for recurrent urinary tract infections.  Patient states that she has had several urinary tract infections over the last year.  [place]    Reviewing her records,  she has had 2 documented positive urinary tract infections for Escherichia coli.    Her symptoms with a urinary tract infection consist of urgency, frequency foul odor and a headache.  Today, she states she is feeling better than she was prior to starting the antibiotics.    Currently, she denies dysuria, gross hematuria, suprapubic pain, back pain, abdominal pain or flank pain.  She has not had any recent fevers, chills, nausea or vomiting.   She is experiencing urinary frequency.  Her CATH UA today is negative.  She does not have a history of nephrolithiasis, GU surgery or GU trauma.   No family history of kidney stones.    She is not sexually active.   She is postmenopausal.   She denies constipation and/or diarrhea.    She does not engage in good perineal hygiene. She does not take tub baths.   She does not have incontinence.    She is not having pain with bladder filling.  She had a contrast CT in 04/2016 which noted Adrenals/Urinary Tract: No masses identified. No evidence of ureteral calculi or hydronephrosis. Unopacified urinary bladder is unremarkable in appearance.  MRI in 11/2016 noted Adrenals/Urinary Tract: Normal adrenal glands. Bilateral too small to characterize renal lesions. Left extrarenal pelvis. No hydronephrosis.  She is drinking 4 to 5 glasses of water daily.   Rare occasions Ginger Ale.  One or two cups of coffee during the  winter.  Takes cranberry tablets x 2 years.   No daily probiotics.     Reviewed referral notes - 06/02/2017 + urine culture for E.coli given septra ds;  06/16/2017 + urine culture for E.coli given Cipro  PMH: Past Medical History:  Diagnosis Date  . Anemia   . Arthritis    BACK-LUMBAR  . Atrial fibrillation ([place])   . Coronary artery disease   . Dysrhythmia   . Hypertension   . Mass in neck    lt side  . Pneumonia 2016  . Stroke ([place]) 2012  . Stroke [facility]) 2012    Surgical History: Past Surgical History:  Procedure Laterality Date  . ABDOMINAL HYSTERECTOMY     Total  . cardiac catherization    . COLONOSCOPY WITH PROPOFOL N/A 11/18/2015   Procedure: COLONOSCOPY WITH PROPOFOL;  Surgeon: Lucilla Lame, MD;  Location: [facility] ENDOSCOPY;  Service: Endoscopy;  Laterality: N/A;  . CORONARY ANGIOPLASTY    . ESOPHAGOGASTRODUODENOSCOPY (EGD) WITH PROPOFOL N/A 04/23/2016   Procedure: ESOPHAGOGASTRODUODENOSCOPY (EGD) WITH PROPOFOL;  Surgeon: Lucilla Lame, MD;  Location: [place];  Service: Endoscopy;  Laterality: N/A;  small bowel bx gastric antrum bx  . throat biopsy      Home Medications:  Allergies as of 06/29/2017      Reactions   Amoxicillin Other (See Comments)   Joint pains      Medication List       Accurate as of 06/29/17  10:06 AM. Always use your most recent med list.          amiodarone 200 MG tablet Commonly known as:  PACERONE Take 200 mg by mouth every morning.   amLODipine 5 MG tablet Commonly known as:  NORVASC Take 1 tablet (5 mg total) by mouth daily.   aspirin EC 81 MG tablet Take 81 mg by mouth daily.   ciprofloxacin 500 MG tablet Commonly known as:  CIPRO Take 1 tablet (500 mg total) by mouth 2 (two) times daily.   CRANBERRY PO Take 1 tablet by mouth daily.   ferrous sulfate 325 (65 FE) MG EC tablet Take 1 tablet (325 mg total) by mouth 3 (three) times daily with meals.   hydrALAZINE 50 MG tablet Commonly known as:  APRESOLINE Take 50  mg by mouth 2 (two) times daily.   lisinopril-hydrochlorothiazide 20-25 MG tablet Commonly known as:  PRINZIDE,ZESTORETIC Take 2 tablets by mouth daily.   multivitamin with minerals tablet Take 1 tablet by mouth daily.       Allergies:  Allergies  Allergen Reactions  . Amoxicillin Other (See Comments)    Joint pains    Family History: Family History  Problem Relation Age of Onset  . Arthritis Mother   . Cancer Mother   . Hypertension Sister   . Asthma Brother   . Breast cancer Neg Hx   . Kidney cancer Neg Hx   . Bladder Cancer Neg Hx     Social History:  reports that she quit smoking about 40 years ago. She has never used smokeless tobacco. She reports that she does not drink alcohol or use drugs.  ROS: UROLOGY Frequent Urination?: Yes Hard to postpone urination?: No Burning/pain with urination?: No Get up at night to urinate?: No Leakage of urine?: No Urine stream starts and stops?: No Trouble starting stream?: No Do you have to strain to urinate?: No Blood in urine?: No Urinary tract infection?: Yes Sexually transmitted disease?: No Injury to kidneys or bladder?: No Painful intercourse?: No Weak stream?: No Currently pregnant?: No Vaginal bleeding?: No Last menstrual period?: n  Gastrointestinal Nausea?: No Vomiting?: No Indigestion/heartburn?: No Diarrhea?: No Constipation?: No  Constitutional Fever: No Night sweats?: No Weight loss?: No Fatigue?: No  Skin Skin rash/lesions?: No Itching?: No  Eyes Blurred vision?: No Double vision?: No  Ears/Nose/Throat Sore throat?: No Sinus problems?: No  Hematologic/Lymphatic Swollen glands?: No Easy bruising?: No  Cardiovascular Leg swelling?: No Chest pain?: No  Respiratory Cough?: No Shortness of breath?: No  Endocrine Excessive thirst?: No  Musculoskeletal Back pain?: Yes Joint pain?: No  Neurological Headaches?: No Dizziness?: No  Psychologic Depression?: No Anxiety?:  No  Physical Exam: BP 135/80   Pulse 67   Ht 5' 3"  (1.6 m)   Wt 168 lb (76.2 kg)   BMI 29.76 kg/m   Constitutional: Well nourished. Alert and oriented, No acute distress. HEENT: [place] AT, moist mucus membranes. Trachea midline, no masses. Cardiovascular: No clubbing, cyanosis, or edema. Respiratory: Normal respiratory effort, no increased work of breathing. GI: Abdomen is soft, non tender, non distended, no abdominal masses. Liver and spleen not palpable.  No hernias appreciated.  Stool sample for occult testing is not indicated.   GU: No CVA tenderness.  No bladder fullness or masses.  Atrophic external genitalia, normal pubic hair distribution, no lesions.  Normal urethral meatus, no lesions, no prolapse, no discharge.   Urethral caruncle is noted.  No bladder fullness, tenderness or masses.  Pale vagina mucosa,  poor estrogen effect, no discharge, no lesions, good pelvic support, Grade II cystocele is noted .  Rectocele is not noted.  Cervix, uterus and adnexa are surgically absent.  Anus and perineum are without rashes or lesions.    Skin: No rashes, bruises or suspicious lesions. Lymph: No cervical or inguinal adenopathy. Neurologic: Grossly intact, no focal deficits, moving all 4 extremities. Psychiatric: Normal mood and affect.  Laboratory Data: Lab Results  Component Value Date   WBC 3.1 (L) 04/11/2017   HGB 10.3 (L) 04/11/2017   HCT 30.8 (L) 04/11/2017   MCV 79.8 (L) 04/11/2017   PLT 174 04/11/2017    Lab Results  Component Value Date   CREATININE 1.09 (H) 04/11/2017    Lab Results  Component Value Date   TSH 1.40 02/07/2017       Component Value Date/Time   CHOL 179 02/09/2017 0917   HDL 40 02/09/2017 0917   LDLCALC 107 (H) 02/09/2017 0917    Lab Results  Component Value Date   AST 16 02/07/2017   Lab Results  Component Value Date   ALT 16 02/07/2017    Urinalysis Negative.  See EPIC.    I have reviewed the labs.   Pertinent Imaging: CLINICAL  DATA:  Generalized abdominal and bilateral flank pain for 1 month. Anemia.  EXAM: CT ABDOMEN AND PELVIS WITH CONTRAST  TECHNIQUE: Multidetector CT imaging of the abdomen and pelvis was performed using the standard protocol following bolus administration of intravenous contrast.  CONTRAST:  132m ISOVUE-300 IOPAMIDOL (ISOVUE-300) INJECTION 61%  COMPARISON:  Right upper quadrant ultrasound on 05/02/2016  FINDINGS: Lower chest:  No acute findings.  Moderate cardiomegaly.  Hepatobiliary: 1.6 cm low-attenuation lesion in the posterior right hepatic lobe. This has nonspecific characteristics by CT but is hyperechoic on recent ultrasound most consistent with a benign hemangioma. No other liver lesions identified. Gallbladder is unremarkable.  Pancreas: No mass, inflammatory changes, or other significant abnormality.  Spleen: No evidence of splenomegaly. A 1.6 cm low-attenuation lesion is seen in the dome of the spleen which has nonspecific characteristics, but is most likely benign in patient without history of cancer. No other splenic lesions identified.  Adrenals/Urinary Tract: No masses identified. No evidence of ureteral calculi or hydronephrosis. Unopacified urinary bladder is unremarkable in appearance.  Stomach/Bowel: Tiny hiatal hernia. No evidence of obstruction, inflammatory process, or abnormal fluid collections. Normal appendix visualized.  Vascular/Lymphatic: No pathologically enlarged lymph nodes. No evidence of abdominal aortic aneurysm. Aortic atherosclerosis.  Reproductive: Prior hysterectomy noted. Adnexal regions are unremarkable in appearance.  Other: None.  Musculoskeletal:  No suspicious bone lesions identified.  IMPRESSION: No acute findings identified within the abdomen or pelvis.  1.6 cm low-attenuation lesion in posterior right hepatic lobe is nonspecific by CT, but has features of a benign hemangioma on recent ultrasound.  Small low-attenuation lesion in the dome of the spleen is also nonspecific, but likely benign. Recommend followup of these lesions with abdomen MRI without and with contrast in 6 months. This recommendation follows ACR consensus guidelines: Managing Incidental Findings on Abdominal CT: White Paper of the ACR Incidental Findings Committee. J Am Coll Radiol 2010;7:754-773, and White Paper of the ACR Incidental Findings Committee II on Splenic and Nodal Findings. [id number]  Incidental findings including tiny hiatal hernia, moderate cardiomegaly and aortic atherosclerosis.   Electronically Signed   By: JEarle GellM.D.   On: 05/20/2016 13:21    CLINICAL DATA:  Right hepatic lobe lesion on CT. Anemia. No history  of primary malignancy submitted.  EXAM: MRI ABDOMEN WITHOUT AND WITH CONTRAST  TECHNIQUE: Multiplanar multisequence MR imaging of the abdomen was performed both before and after the administration of intravenous contrast.  CONTRAST:  65m MULTIHANCE GADOBENATE DIMEGLUMINE 529 MG/ML IV SOLN  COMPARISON:  CT 05/20/2016 and ultrasound of 05/02/2016  FINDINGS: Mild to moderate motion degradation, especially involving the pre and postcontrast dynamic series.  Lower chest: Mild cardiomegaly.  Tiny hiatal hernia.  Hepatobiliary: No evidence of cirrhosis. Corresponding to the CT abnormality, within the posterior right hepatic lobe, is a 1.6 cm T2 hyperintense lesion on image 8/ series 3. Although suboptimally evaluated after contrast, this demonstrates diffuse postcontrast enhancement including on image 23/series 19. Normal gallbladder, without biliary ductal dilatation.  Pancreas:  Normal, without mass or ductal dilatation.  Spleen: Subcapsular 11 mm superior splenic T2 hyperintense focus measures 11 mm on image 7/ series 2. This is similar to on the prior exam. Not well characterized after contrast, but of doubtful clinical  significance.  Adrenals/Urinary Tract: Normal adrenal glands. Bilateral too small to characterize renal lesions. Left extrarenal pelvis. No hydronephrosis.  Stomach/Bowel: Normal remainder of the stomach. Grossly normal large and small bowel loops.  Vascular/Lymphatic: Normal caliber of the aorta and branch vessels. No retroperitoneal or retrocrural adenopathy.  Other:  No ascites.  Musculoskeletal: No acute osseous abnormality.  IMPRESSION: 1. Mild to moderately motion degraded exam. 2. The right hepatic lobe lesion is unchanged in size, and most consistent with a hemangioma. 3.  Tiny hiatal hernia.   Electronically Signed   By: KAbigail MiyamotoM.D.   On: 12/06/2016 10:42  I have independently reviewed the films.    Assessment & Plan:    1. Recurrent UTI's  - criteria for recurrent UTI has been met with 2 or more infections in 6 months or 3 or greater infections in one year - CATH UA today is negative and patient states she is not having symptoms at this time   - Patient is instructed to increase their water intake until the urine is pale yellow or clear (10 to 12 cups daily)   - probiotics (yogurt, oral pills or vaginal suppositories), take cranberry pills or drink the juice and Vitamin C 1,000 mg daily to acidify the urine should be added to their daily regimen   - avoid soaking in tubs and wipe front to back after urinating   - benefit from core strengthening exercises has been seen  - advised them to have CATH UA's for urinalysis and culture to prevent skin contamination of the specimen - explained to the patient that [place] rooms and that we would like to establish a trend with her recurrent UTIs to ensure we are treating the organism responsible for the infection and not a contaminant or if her symptoms are not caused by an UTI we would need to do further investigation  - reviewed  symptoms of UTI and advised not to have urine checked or be treated for UTI if not experiencing symptoms  - She will follow-up in 3 months for PVR and OAB questionnaire    2. Vaginal atrophy  - I explained to the patient that when women go through menopause and her estrogen levels are severely diminished, the normal vaginal flora will change.  This is due to an increase of the vaginal canal's pH. Because of this, the vaginal canal may be colonized by bacteria from  the rectum instead of the protective lactobacillus.  This accompanied by the loss of the mucus barrier with vaginal atrophy is a cause of recurrent urinary tract infections.  - Unfortunately patient has suffered strokes and is not a candidate for vaginal estrogen therapy - she is instructed to take probiotics or eat foods that have probiotics                                       Return in about 3 months (around 09/29/2017) for PVR and OAB questionnaire.  These notes generated with voice recognition software. I apologize for typographical errors.  Zara Council, [facility] [address] [place], [place] [address] [phone]

## 2017-06-29 ENCOUNTER — Encounter: Payer: Self-pay | Admitting: Urology

## 2017-06-29 ENCOUNTER — Ambulatory Visit: Payer: Medicare Other | Admitting: Internal Medicine

## 2017-06-29 ENCOUNTER — Ambulatory Visit: Payer: Medicare Other

## 2017-06-29 ENCOUNTER — Ambulatory Visit (INDEPENDENT_AMBULATORY_CARE_PROVIDER_SITE_OTHER): Payer: Medicare Other | Admitting: Urology

## 2017-06-29 ENCOUNTER — Other Ambulatory Visit: Payer: Medicare Other

## 2017-06-29 VITALS — BP 135/80 | HR 67 | Ht 63.0 in | Wt 168.0 lb

## 2017-06-29 DIAGNOSIS — N390 Urinary tract infection, site not specified: Secondary | ICD-10-CM

## 2017-06-29 DIAGNOSIS — N952 Postmenopausal atrophic vaginitis: Secondary | ICD-10-CM | POA: Diagnosis not present

## 2017-06-29 LAB — MICROSCOPIC EXAMINATION
EPITHELIAL CELLS (NON RENAL): NONE SEEN /HPF (ref 0–10)
RBC MICROSCOPIC, UA: NONE SEEN /HPF (ref 0–?)
WBC UA: NONE SEEN /HPF (ref 0–?)

## 2017-06-29 LAB — URINALYSIS, COMPLETE
Bilirubin, UA: NEGATIVE
Glucose, UA: NEGATIVE
Ketones, UA: NEGATIVE
Leukocytes, UA: NEGATIVE
Nitrite, UA: NEGATIVE
PH UA: 5 (ref 5.0–7.5)
Protein, UA: NEGATIVE
RBC UA: NEGATIVE
Specific Gravity, UA: 1.01 (ref 1.005–1.030)
UUROB: 0.2 mg/dL (ref 0.2–1.0)

## 2017-06-29 NOTE — Progress Notes (Signed)
In and Out Catheterization  Patient is present today for a I & O catheterization due to recurrent uti. Patient was cleaned and prepped in a sterile fashion with betadine and Lidocaine 2% jelly was instilled into the urethra.  A 14FR cath was inserted no complications were noted , 65 ml of urine return was noted, urine was yellow in color. A clean urine sample was collected for urinalysis. Bladder was drained and catheter was removed with out difficulty.    Preformed by: Lyndee Hensen CMA

## 2017-06-29 NOTE — Patient Instructions (Signed)
                                             Urinary Tract Infection Prevention Patient Education Stay Hydrated: Urinary tract infections (UTIs) are less likely to occur in someone who is drinking enough water to promote regular urination, so it is very important to stay hydrated in order to help flush out bacteria from the urinary tract. Respond to "Nature's Call": It is always a good idea to urinate as soon as you feel the need. While "holding it in" does not directly cause an infection, it can cause overdistension that can damage the lining of the bladder, making it more vulnerable to bacteria. D-Mannose daily may help prevent infections Practice Proper Bathroom Hygiene: To keep bacteria near the urethral opening to a minimum, it is important to practice proper wiping techniques (i.e. front to back wiping) to help prevent rectal bacteria from entering the uretro-genital area. It can also be helpful to take showers and avoid soaking in the bathtub.  Take a Vitamin C Supplement: About 1,000 milligrams of vitamin C taken daily can help inhibit the growth of some bacteria by acidifying the urine. Maintain Control with Cranberries: Cranberries contain hippuronic acid, which is a natural antiseptic that may help prevent the adherence of bacteria to the bladder lining. Drinking 100% pure cranberry juice or taking over the counter cranberry supplements twice daily may help to prevent an infection. However, it is important to note that cranberry juices/supplements are not helpful once a urinary tract infection (UTI) is present. Strengthen Your Core: Often, a lazy bladder (unable to empty urine properly) occurs due to lower back problem, so consider doing exercises to help strengthen your back, pelvic floor, and stomach muscles. Please contact our office when you have symptoms of an UTI so that we can manage your infection  Pay Attention to Your Urine: Your urine can change color for a variety of reasons,  including from the medications you take, so pay close attention to it to monitor your overall health. One key thing to note is that if your urine is typically a darker yellow, your body is dehydrated, so you need to step up your water intake.

## 2017-07-01 ENCOUNTER — Inpatient Hospital Stay: Payer: Medicare Other

## 2017-07-01 ENCOUNTER — Inpatient Hospital Stay (HOSPITAL_BASED_OUTPATIENT_CLINIC_OR_DEPARTMENT_OTHER): Payer: Medicare Other | Admitting: Internal Medicine

## 2017-07-01 ENCOUNTER — Inpatient Hospital Stay: Payer: Medicare Other | Attending: Internal Medicine

## 2017-07-01 VITALS — BP 126/75 | HR 56 | Temp 98.1°F | Resp 56 | Wt 168.8 lb

## 2017-07-01 DIAGNOSIS — M2550 Pain in unspecified joint: Secondary | ICD-10-CM

## 2017-07-01 DIAGNOSIS — I10 Essential (primary) hypertension: Secondary | ICD-10-CM | POA: Diagnosis not present

## 2017-07-01 DIAGNOSIS — D72819 Decreased white blood cell count, unspecified: Secondary | ICD-10-CM | POA: Insufficient documentation

## 2017-07-01 DIAGNOSIS — D638 Anemia in other chronic diseases classified elsewhere: Secondary | ICD-10-CM

## 2017-07-01 DIAGNOSIS — I2510 Atherosclerotic heart disease of native coronary artery without angina pectoris: Secondary | ICD-10-CM

## 2017-07-01 DIAGNOSIS — I4891 Unspecified atrial fibrillation: Secondary | ICD-10-CM | POA: Diagnosis not present

## 2017-07-01 DIAGNOSIS — Z87891 Personal history of nicotine dependence: Secondary | ICD-10-CM | POA: Insufficient documentation

## 2017-07-01 DIAGNOSIS — K769 Liver disease, unspecified: Secondary | ICD-10-CM | POA: Insufficient documentation

## 2017-07-01 DIAGNOSIS — Z7982 Long term (current) use of aspirin: Secondary | ICD-10-CM | POA: Diagnosis not present

## 2017-07-01 DIAGNOSIS — R221 Localized swelling, mass and lump, neck: Secondary | ICD-10-CM | POA: Diagnosis not present

## 2017-07-01 DIAGNOSIS — M549 Dorsalgia, unspecified: Secondary | ICD-10-CM | POA: Insufficient documentation

## 2017-07-01 DIAGNOSIS — Z8673 Personal history of transient ischemic attack (TIA), and cerebral infarction without residual deficits: Secondary | ICD-10-CM

## 2017-07-01 DIAGNOSIS — Z8701 Personal history of pneumonia (recurrent): Secondary | ICD-10-CM | POA: Insufficient documentation

## 2017-07-01 DIAGNOSIS — D649 Anemia, unspecified: Secondary | ICD-10-CM | POA: Insufficient documentation

## 2017-07-01 DIAGNOSIS — D500 Iron deficiency anemia secondary to blood loss (chronic): Secondary | ICD-10-CM

## 2017-07-01 DIAGNOSIS — Z79899 Other long term (current) drug therapy: Secondary | ICD-10-CM | POA: Insufficient documentation

## 2017-07-01 LAB — CBC WITH DIFFERENTIAL/PLATELET
BASOS ABS: 0 10*3/uL (ref 0–0.1)
Basophils Relative: 1 %
EOS PCT: 2 %
Eosinophils Absolute: 0.1 10*3/uL (ref 0–0.7)
HEMATOCRIT: 32.7 % — AB (ref 35.0–47.0)
Hemoglobin: 11.2 g/dL — ABNORMAL LOW (ref 12.0–16.0)
LYMPHS PCT: 17 %
Lymphs Abs: 0.7 10*3/uL — ABNORMAL LOW (ref 1.0–3.6)
MCH: 28.4 pg (ref 26.0–34.0)
MCHC: 34.2 g/dL (ref 32.0–36.0)
MCV: 83.1 fL (ref 80.0–100.0)
MONO ABS: 0.2 10*3/uL (ref 0.2–0.9)
MONOS PCT: 6 %
NEUTROS ABS: 2.8 10*3/uL (ref 1.4–6.5)
Neutrophils Relative %: 74 %
PLATELETS: 157 10*3/uL (ref 150–440)
RBC: 3.93 MIL/uL (ref 3.80–5.20)
RDW: 17.3 % — AB (ref 11.5–14.5)
WBC: 3.9 10*3/uL (ref 3.6–11.0)

## 2017-07-01 NOTE — Assessment & Plan Note (Addendum)
#   Anemia- likely IDA vs anemia of chronic disease  [unclear etiology]- s/p IV iron; improved. Hb ~11; on iron pills 3 times a day. No IV iron today. Recommend continued oral iron.  # Mild leukopenia white count 2.5/lymphopenia-RESOLVED. [BMBx- NEG] Asymptomatic. Normal- HIV/hepatitis/ copper/zinc.   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 Patient is okay to have surgery from hematology standpoint. Recommend following up with ENT regarding surgery.  Follow up as needed or if hemoglobin starts dropping in future; pt agrees.   Cc; Dr.Vaught/Dr.Johnson.

## 2017-07-01 NOTE — Progress Notes (Signed)
[place] CONSULT NOTE  Patient Care Team: Valerie Roys, DO as PCP - General (Family Medicine) Ubaldo Glassing Javier Docker, MD as Consulting Physician (Cardiology) Carloyn Manner, MD as Referring Physician (Otolaryngology)  CHIEF COMPLAINTS/PURPOSE OF CONSULTATION:   # Anemia of chronic disease- EGD/ Colo [Dr.Wohl; 2017] chronic back pain [? 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    No history exists.     HISTORY OF PRESENTING ILLNESS:  Kristina Henson 61 y.o.  female noted Moderate anemia of Unclear etiology- iron deficiency versus chronic disease is here for follow-up.  Patient's back pain and joint pains are improved; since physical therapy.  Her energy levels are improved. No blood in stools or black stools. She takes iron pills 3 times a day. No constipation.   ROS: A complete 10 point review of system is done which is negative except mentioned above in history of present illness  MEDICAL HISTORY:  Past Medical History:  Diagnosis Date  . Anemia   . Arthritis    BACK-LUMBAR  . Atrial fibrillation ([place])   . Coronary artery disease   . Dysrhythmia   . Hypertension   . Mass in neck    lt side  . Pneumonia 2016  . Stroke ([place]) 2012  . Stroke [facility]) 2012    SURGICAL HISTORY: Past Surgical History:  Procedure Laterality Date  . ABDOMINAL HYSTERECTOMY     Total  . cardiac catherization    . COLONOSCOPY WITH PROPOFOL N/A 11/18/2015   Procedure: COLONOSCOPY WITH PROPOFOL;  Surgeon: Lucilla Lame, MD;  Location: [facility] ENDOSCOPY;  Service: Endoscopy;  Laterality: N/A;  . CORONARY ANGIOPLASTY    . ESOPHAGOGASTRODUODENOSCOPY (EGD) WITH PROPOFOL N/A 04/23/2016   Procedure: ESOPHAGOGASTRODUODENOSCOPY (EGD) WITH PROPOFOL;  Surgeon: Lucilla Lame,  MD;  Location: [facility];  Service: Endoscopy;  Laterality: N/A;  small bowel bx gastric antrum bx  . throat biopsy      SOCIAL HISTORY: Social History   Social History  . Marital status: Divorced    Spouse name: N/A  . Number of children: N/A  . Years of education: N/A   Occupational History  . Not on file.   Social History Main Topics  . Smoking status: Former Smoker    Quit date: 1978  . Smokeless tobacco: Never Used  . Alcohol use No  . Drug use: No  . Sexual activity: Yes   Other Topics Concern  . Not on file   Social History Narrative  . No narrative on file    FAMILY HISTORY: Family History  Problem Relation Age of Onset  . Arthritis Mother   . Cancer Mother   . Hypertension Sister   . Asthma Brother   . Breast cancer Neg Hx   . Kidney cancer Neg Hx   . Bladder Cancer Neg Hx     ALLERGIES:  is allergic to amoxicillin.  MEDICATIONS:  Current Outpatient Prescriptions  Medication Sig Dispense Refill  . amiodarone (PACERONE) 200 MG tablet Take 200 mg by mouth every morning.     Marland Kitchen amLODipine (NORVASC) 5 MG tablet Take 1 tablet (5 mg total) by mouth daily. 30 tablet 3  . aspirin EC 81 MG tablet Take 81 mg by mouth daily.     Marland Kitchen CRANBERRY PO Take 1 tablet by mouth daily.    Marland Kitchen  ferrous sulfate 325 (65 FE) MG EC tablet Take 1 tablet (325 mg total) by mouth 3 (three) times daily with meals. 90 tablet 1  . hydrALAZINE (APRESOLINE) 50 MG tablet Take 50 mg by mouth 2 (two) times daily.    Marland Kitchen lisinopril-hydrochlorothiazide (PRINZIDE,ZESTORETIC) 20-25 MG tablet Take 2 tablets by mouth daily. 180 tablet 1  . Multiple Vitamins-Minerals (MULTIVITAMIN WITH MINERALS) tablet Take 1 tablet by mouth daily.     No current facility-administered medications for this visit.       Marland Kitchen  PHYSICAL EXAMINATION: ECOG PERFORMANCE STATUS: 1 - Symptomatic but completely ambulatory  Vitals:   07/01/17 1153  BP: 126/75  Pulse: (!) 56  Resp: (!) 56  Temp: 98.1 F (36.7  C)   Filed Weights   07/01/17 1153  Weight: 168 lb 12.8 oz (76.6 kg)    GENERAL: Well-nourished well-developed; Alert, no distress and comfortable.  She is alone.  EYES: no pallor or icterus OROPHARYNX: no thrush or ulceration; good dentition  NECK: supple.  LYMPH: Approximate 2 cm submandibular mass felt  no palpable lymphadenopathy in the axillary or inguinal regions LUNGS: Decreased breath sounds at the right lower base.  No wheeze or crackles HEART/CVS: regular rate & rhythm and no murmurs; No lower extremity edema ABDOMEN: abdomen soft, non-tender and normal bowel sounds Musculoskeletal:no cyanosis of digits and no clubbing  PSYCH: alert & oriented x 3 with fluent speech NEURO: no focal motor/sensory deficits SKIN:  no rashes or significant lesions  LABORATORY DATA:  I have reviewed the data as listed Lab Results  Component Value Date   WBC 3.9 07/01/2017   HGB 11.2 (L) 07/01/2017   HCT 32.7 (L) 07/01/2017   MCV 83.1 07/01/2017   PLT 157 07/01/2017    Recent Labs  08/11/16 0830  12/10/16 1350 02/07/17 04/11/17 1409  NA 141  --  137 139 138  K 4.0  --  3.7 3.9 4.1  CL 100  --  105  --  104  CO2 27  --  27  --  28  GLUCOSE 115*  --  109*  --  100*  BUN 21  --  36* 26* 36*  CREATININE 0.91  < > 0.91 0.8 1.09*  CALCIUM 9.3  --  9.1  --  9.5  GFRNONAA 70  --  >60  --  54*  GFRAA 80  --  >60  --  >60  PROT 6.9  --   --   --   --   ALBUMIN 3.9  --   --   --   --   AST 16  --   --  16  --   ALT 15  --   --  16  --   ALKPHOS 83  --   --   --   --   BILITOT 0.4  --   --   --   --   < > = values in this interval not displayed.  RADIOGRAPHIC STUDIES: I have personally reviewed the radiological images as listed and agreed with the findings in the report. No results found.  ASSESSMENT & PLAN:   Anemia due to chronic illness # Anemia- likely IDA vs anemia of chronic disease  [unclear etiology]- s/p IV iron; improved. Hb ~11; on iron pills 3 times a day. No IV iron  today. Recommend continued oral iron.  # Mild leukopenia white count 2.5/lymphopenia-RESOLVED. [BMBx- NEG] Asymptomatic. Normal- HIV/hepatitis/ copper/zinc.   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 Patient is okay to have surgery from hematology standpoint. Recommend following up with ENT regarding surgery.  Follow up as needed or if hemoglobin starts dropping in future; pt agrees.   Cc; Dr.Vaught/Dr.Johnson.   All questions were answered. The patient knows to call the clinic with any problems, questions or concerns.     Cammie Sickle, MD 07/01/2017 1:00 PM

## 2017-07-08 DIAGNOSIS — D117 Benign neoplasm of other major salivary glands: Secondary | ICD-10-CM | POA: Diagnosis not present

## 2017-07-14 ENCOUNTER — Telehealth: Payer: Self-pay

## 2017-07-14 NOTE — Telephone Encounter (Signed)
Called and left a message for patient to return my call. Two appointments scheduled for Surgical Clearance for procedure with ENT [facility] Cardiology Dr.Fath Cardiac Clearance 07/26/17 @ 10:15am [facility].Johnson Surgical Clearance 08/01/17 @ 1:00pm   OK TO GIVE THESE APPOINTMENTS IF PATIENT CALLS BACK

## 2017-07-14 NOTE — Telephone Encounter (Signed)
Patient made aware of appointments.

## 2017-07-19 ENCOUNTER — Ambulatory Visit (INDEPENDENT_AMBULATORY_CARE_PROVIDER_SITE_OTHER): Payer: Medicare Other | Admitting: Family Medicine

## 2017-07-19 ENCOUNTER — Encounter: Payer: Self-pay | Admitting: Family Medicine

## 2017-07-19 VITALS — BP 130/72 | HR 45 | Wt 170.0 lb

## 2017-07-19 DIAGNOSIS — I129 Hypertensive chronic kidney disease with stage 1 through stage 4 chronic kidney disease, or unspecified chronic kidney disease: Secondary | ICD-10-CM | POA: Diagnosis not present

## 2017-07-19 DIAGNOSIS — K921 Melena: Secondary | ICD-10-CM

## 2017-07-19 MED ORDER — AMLODIPINE BESYLATE 5 MG PO TABS: 5.0000 mg | ORAL_TABLET | Freq: Every day | ORAL | 1 refills | Status: DC

## 2017-07-19 NOTE — Assessment & Plan Note (Signed)
Under good control. Continue current regimen. Call with any concerns.  

## 2017-07-19 NOTE — Progress Notes (Signed)
BP 130/72   Pulse (!) 45   Wt 170 lb (77.1 kg)   SpO2 95%   BMI 30.11 kg/m    Subjective:    Patient ID: Kristina Henson, female    DOB: 04-01-1956, 61 y.o.   MRN: [id number]  HPI: Kristina Henson is a 61 y.o. female  Chief Complaint  Patient presents with  . Follow-up   RECTAL BLEEDING Duration: about a month ago Bright red rectal bleeding: yes  Amount of blood: spotting  Frequency: 2-3 times Melena: no  Spotting on toilet tissue: yes  Anal fullness: yes  Perianal pain: no  Severity: mild Perianal irritation/itching: yes  Constipation: yes  Chronic straining/valsava:  no  Anal trauma/intercourse: no  Hemorrhoids: yes  Previous colonoscopy: yes   HYPERTENSION Hypertension status: controlled  Satisfied with current treatment? yes Duration of hypertension: chronic BP monitoring frequency:  not checking BP medication side effects:  no Medication compliance: excellent compliance Previous BP meds: Aspirin: yes Recurrent headaches: no Visual changes: no Palpitations: no Dyspnea: no Chest pain: no Lower extremity edema: no Dizzy/lightheaded: no  Relevant past medical, surgical, family and social history reviewed and updated as indicated. Interim medical history since our last visit reviewed. Allergies and medications reviewed and updated.  Review of Systems  Constitutional: Negative.   Respiratory: Negative.   Cardiovascular: Negative.   Gastrointestinal: Positive for blood in stool. Negative for abdominal distention, abdominal pain, anal bleeding, constipation, diarrhea, nausea, rectal pain and vomiting.  Psychiatric/Behavioral: Negative.     Per HPI unless specifically indicated above     Objective:    BP 130/72   Pulse (!) 45   Wt 170 lb (77.1 kg)   SpO2 95%   BMI 30.11 kg/m   Wt Readings from Last 3 Encounters:  07/19/17 170 lb (77.1 kg)  07/01/17 168 lb 12.8 oz (76.6 kg)  06/29/17 168 lb (76.2 kg)    Physical Exam  Constitutional: She is  oriented to person, place, and time. She appears well-developed and well-nourished. No distress.  HENT:  Head: Normocephalic and atraumatic.  Right Ear: Hearing normal.  Left Ear: Hearing normal.  Nose: Nose normal.  Eyes: Conjunctivae and lids are normal. Right eye exhibits no discharge. Left eye exhibits no discharge. No scleral icterus.  Cardiovascular: Normal rate, regular rhythm, normal heart sounds and intact distal pulses.  Exam reveals no gallop and no friction rub.   No murmur heard. Pulmonary/Chest: Effort normal and breath sounds normal. No respiratory distress. She has no wheezes. She has no rales. She exhibits no tenderness.  Musculoskeletal: Normal range of motion.  Neurological: She is alert and oriented to person, place, and time.  Skin: Skin is warm, dry and intact. No rash noted. She is not diaphoretic. No erythema. No pallor.  Psychiatric: She has a normal mood and affect. Her speech is normal and behavior is normal. Judgment and thought content normal. Cognition and memory are normal.  Nursing note and vitals reviewed.   Results for orders placed or performed in visit on 07/01/17  CBC with Differential/Platelet  Result Value Ref Range   WBC 3.9 3.6 - 11.0 K/uL   RBC 3.93 3.80 - 5.20 MIL/uL   Hemoglobin 11.2 (L) 12.0 - 16.0 g/dL   HCT 32.7 (L) 35.0 - 47.0 %   MCV 83.1 80.0 - 100.0 fL   MCH 28.4 26.0 - 34.0 pg   MCHC 34.2 32.0 - 36.0 g/dL   RDW 17.3 (H) 11.5 - 14.5 %   Platelets  157 150 - 440 K/uL   Neutrophils Relative % 74 %   Neutro Abs 2.8 1.4 - 6.5 K/uL   Lymphocytes Relative 17 %   Lymphs Abs 0.7 (L) 1.0 - 3.6 K/uL   Monocytes Relative 6 %   Monocytes Absolute 0.2 0.2 - 0.9 K/uL   Eosinophils Relative 2 %   Eosinophils Absolute 0.1 0 - 0.7 K/uL   Basophils Relative 1 %   Basophils Absolute 0.0 0 - 0.1 K/uL      Assessment & Plan:   Problem List Items Addressed This Visit      Genitourinary   Benign hypertensive renal disease - Primary    Under  good control. Continue current regimen. Call with any concerns.       Relevant Orders   Basic metabolic panel    Other Visit Diagnoses    Blood in stool       Likley hemorrhoid. Will check FOBT. Sent home with patient. Await results. Call with any concerns.    Relevant Orders   IFOBT POC (occult bld, rslt in office)       Follow up plan: Return As scheduled.

## 2017-07-20 ENCOUNTER — Encounter: Payer: Self-pay | Admitting: Family Medicine

## 2017-07-20 LAB — BASIC METABOLIC PANEL
BUN/Creatinine Ratio: 35 — ABNORMAL HIGH (ref 9–23)
BUN: 26 mg/dL — AB (ref 6–24)
CALCIUM: 9.3 mg/dL (ref 8.7–10.2)
CHLORIDE: 102 mmol/L (ref 96–106)
CO2: 24 mmol/L (ref 20–29)
Creatinine, Ser: 0.75 mg/dL (ref 0.57–1.00)
GFR, EST AFRICAN AMERICAN: 101 mL/min/{1.73_m2} (ref >59)
GFR, EST NON AFRICAN AMERICAN: 88 mL/min/{1.73_m2} (ref >59)
Glucose: 93 mg/dL (ref 65–99)
Potassium: 3.8 mmol/L (ref 3.5–5.2)
Sodium: 142 mmol/L (ref 134–144)

## 2017-07-21 ENCOUNTER — Other Ambulatory Visit: Payer: Self-pay | Admitting: Family Medicine

## 2017-07-21 DIAGNOSIS — Z1231 Encounter for screening mammogram for malignant neoplasm of breast: Secondary | ICD-10-CM

## 2017-07-26 DIAGNOSIS — I639 Cerebral infarction, unspecified: Secondary | ICD-10-CM | POA: Diagnosis not present

## 2017-07-26 DIAGNOSIS — I4891 Unspecified atrial fibrillation: Secondary | ICD-10-CM | POA: Diagnosis not present

## 2017-07-26 DIAGNOSIS — I10 Essential (primary) hypertension: Secondary | ICD-10-CM | POA: Diagnosis not present

## 2017-07-26 DIAGNOSIS — R0789 Other chest pain: Secondary | ICD-10-CM | POA: Diagnosis not present

## 2017-07-26 DIAGNOSIS — E7801 Familial hypercholesterolemia: Secondary | ICD-10-CM | POA: Diagnosis not present

## 2017-07-29 ENCOUNTER — Encounter: Payer: Self-pay | Admitting: Family Medicine

## 2017-07-29 ENCOUNTER — Ambulatory Visit: Payer: Medicare Other

## 2017-07-29 DIAGNOSIS — Z1211 Encounter for screening for malignant neoplasm of colon: Secondary | ICD-10-CM

## 2017-07-29 LAB — FECAL OCCULT BLOOD, GUAIAC
SPECIMEN 2: NEGATIVE
SPECIMEN 3: NEGATIVE
Specimen 1: NEGATIVE

## 2017-08-01 ENCOUNTER — Ambulatory Visit (INDEPENDENT_AMBULATORY_CARE_PROVIDER_SITE_OTHER): Payer: Medicare Other | Admitting: Unknown Physician Specialty

## 2017-08-01 ENCOUNTER — Encounter: Payer: Self-pay | Admitting: Unknown Physician Specialty

## 2017-08-01 DIAGNOSIS — D638 Anemia in other chronic diseases classified elsewhere: Secondary | ICD-10-CM

## 2017-08-01 NOTE — Progress Notes (Signed)
   BP 133/73   Pulse 60   Temp (!) 97.4 F (36.3 C) (Oral)   Wt 171 lb 9.6 oz (77.8 kg)   SpO2 97%   BMI 30.40 kg/m    Subjective:    Patient ID: Kristina Henson, female    DOB: 06/06/1956, 61 y.o.   MRN: [id number]  HPI: Kristina Henson is a 61 y.o. female  Chief Complaint  Patient presents with  . Surgical Clearance    pt had cardiac clearance 07/26/2017   Pt is here for a gland excision and was referred by ENT for pre-op clearance.  History of a-fib and already had cardiac clearance.    She had had surgery before and never had trouble with anesthesia.    Reason listed is anemia.  Last H/H was 11.2/32.7   Relevant past medical, surgical, family and social history reviewed and updated as indicated. Interim medical history since our last visit reviewed. Allergies and medications reviewed and updated.  Review of Systems  Per HPI unless specifically indicated above     Objective:    BP 133/73   Pulse 60   Temp (!) 97.4 F (36.3 C) (Oral)   Wt 171 lb 9.6 oz (77.8 kg)   SpO2 97%   BMI 30.40 kg/m   Wt Readings from Last 3 Encounters:  08/01/17 171 lb 9.6 oz (77.8 kg)  07/19/17 170 lb (77.1 kg)  07/01/17 168 lb 12.8 oz (76.6 kg)    Physical Exam  Constitutional: She is oriented to person, place, and time. She appears well-developed and well-nourished. No distress.  HENT:  Head: Normocephalic and atraumatic.  Eyes: Conjunctivae and lids are normal. Right eye exhibits no discharge. Left eye exhibits no discharge. No scleral icterus.  Neck: Normal range of motion. Neck supple. No JVD present. Carotid bruit is not present.  Cardiovascular: Normal heart sounds. An irregularly irregular rhythm present.  Pulmonary/Chest: Effort normal and breath sounds normal.  Abdominal: Normal appearance. There is no splenomegaly or hepatomegaly.  Musculoskeletal: Normal range of motion.  Neurological: She is alert and oriented to person, place, and time.  Skin: Skin is warm, dry and  intact. No rash noted. No pallor.  Psychiatric: She has a normal mood and affect. Her behavior is normal. Judgment and thought content normal.    Results for orders placed or performed in visit on 07/29/17  [place] Stool Card-TAKE HOME  Result Value Ref Range   Specimen 1 neg    Specimen 2 neg    Specimen 3 neg       Assessment & Plan:   Problem List Items Addressed This Visit      Unprioritized   Anemia due to chronic illness    Mild changes in H/H.  Ok for surgery          Follow up plan: Return if symptoms worsen or fail to improve.

## 2017-08-01 NOTE — Assessment & Plan Note (Signed)
Mild changes in H/H.  [place] for surgery

## 2017-08-02 ENCOUNTER — Other Ambulatory Visit: Payer: Medicare Other

## 2017-08-05 IMAGING — MG MM DIGITAL SCREENING BILAT W/ TOMO W/ CAD
8 of 13 series · 8 of 29 positions shown · non-contrast
Comparison: Previous exam(s).

CLINICAL DATA: Screening.

EXAM:
2D DIGITAL SCREENING BILATERAL MAMMOGRAM WITH CAD AND ADJUNCT TOMO

[L MLO (1 of 2)]
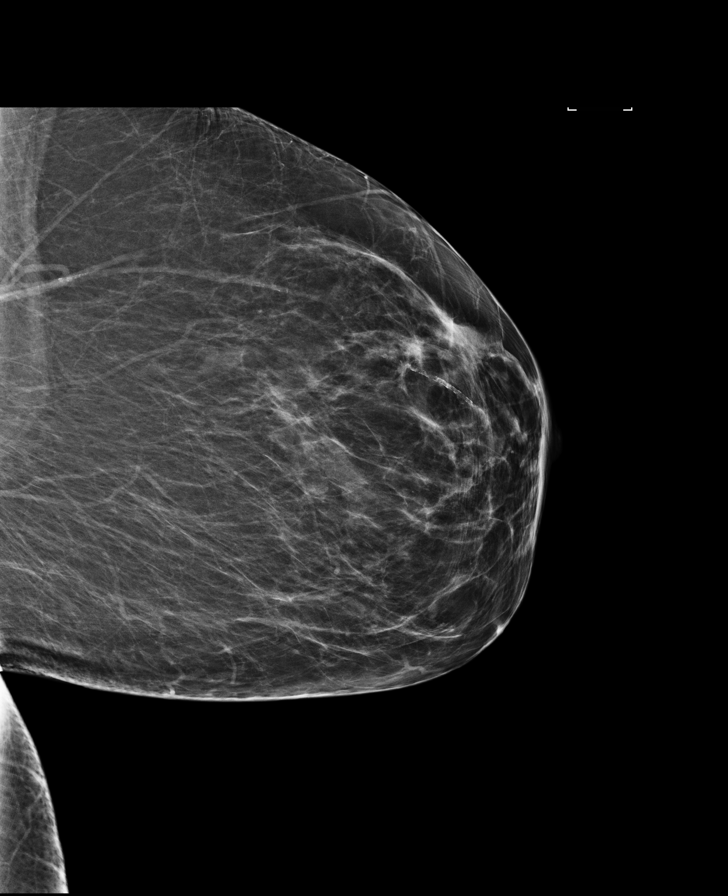

[L CC]
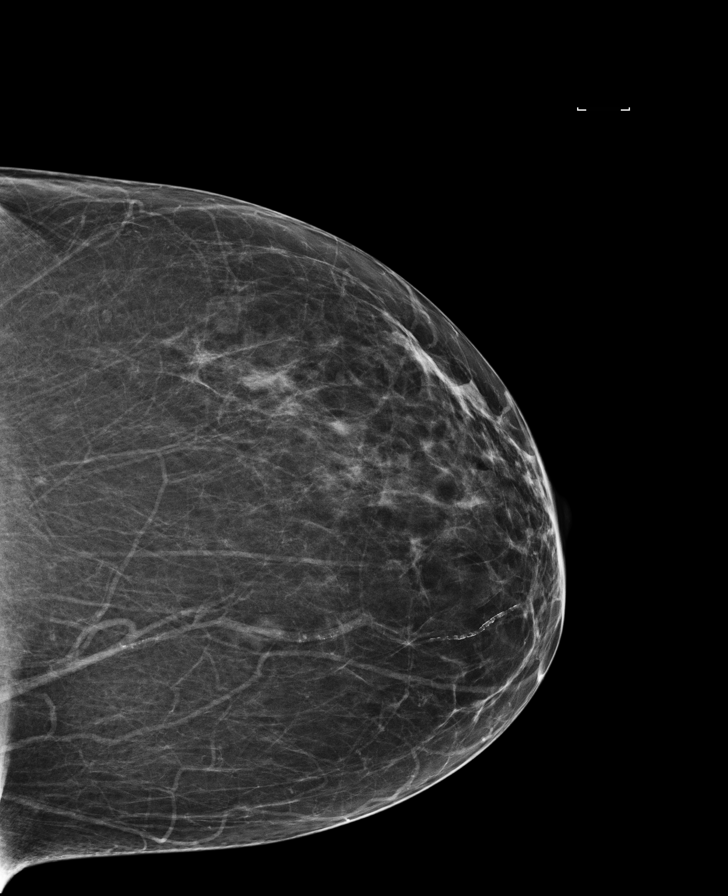

[L MLO (2 of 2)]
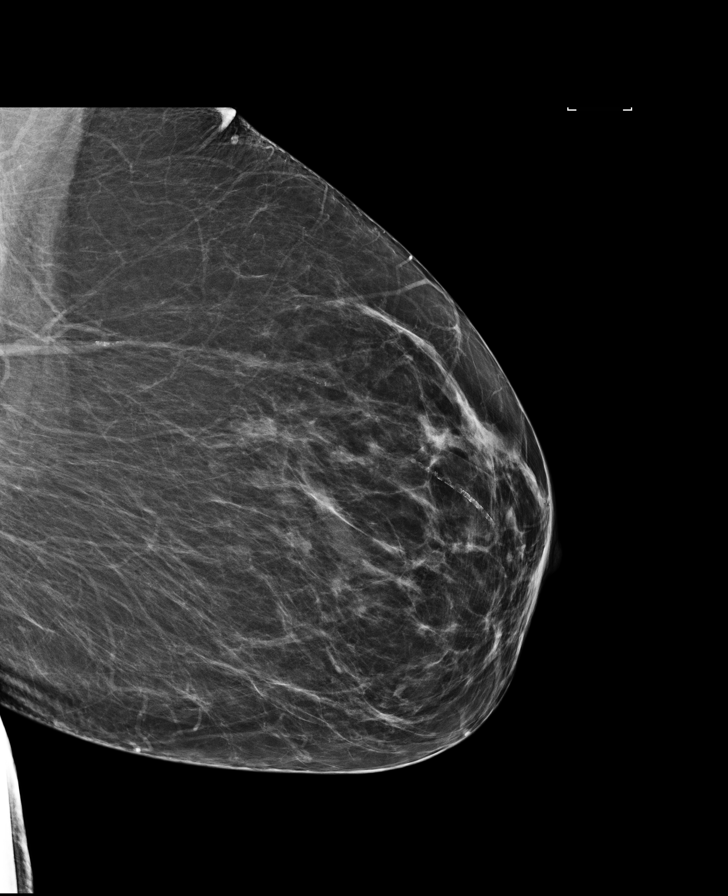

[R CC synth-2D]
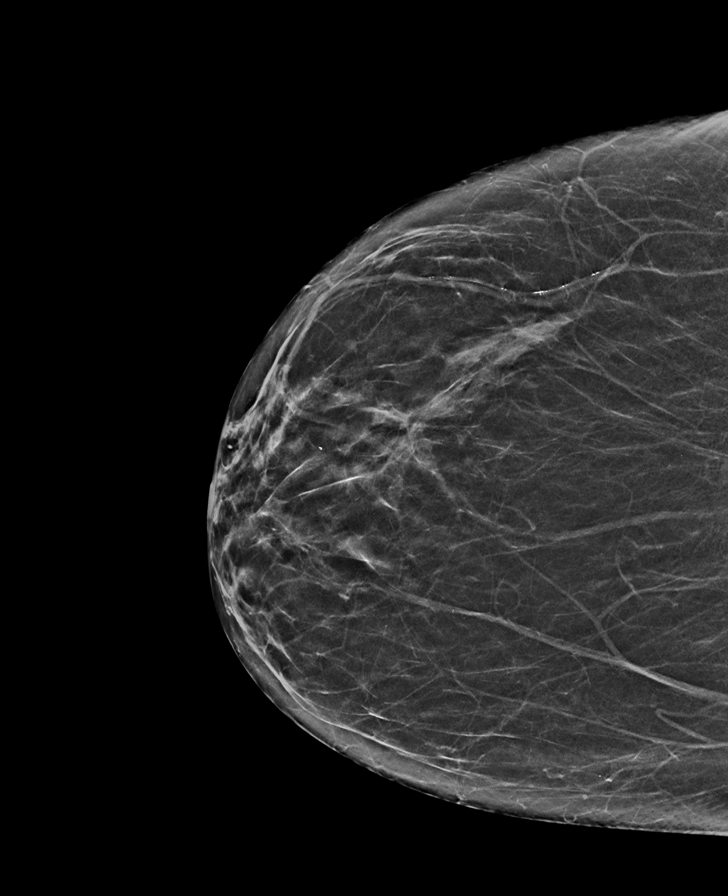

[R CC]
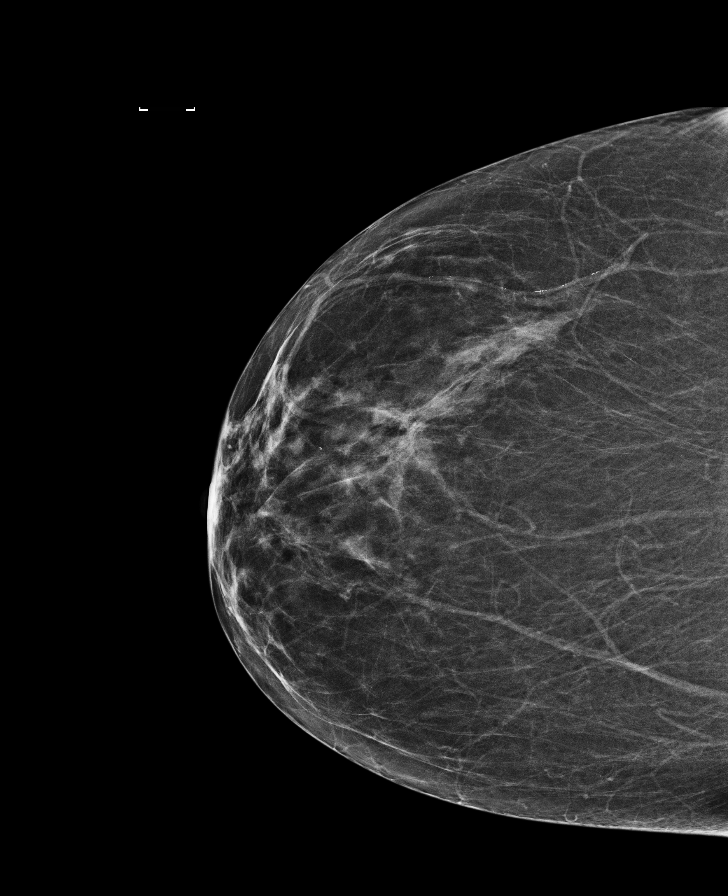

[L MLO synth-2D]
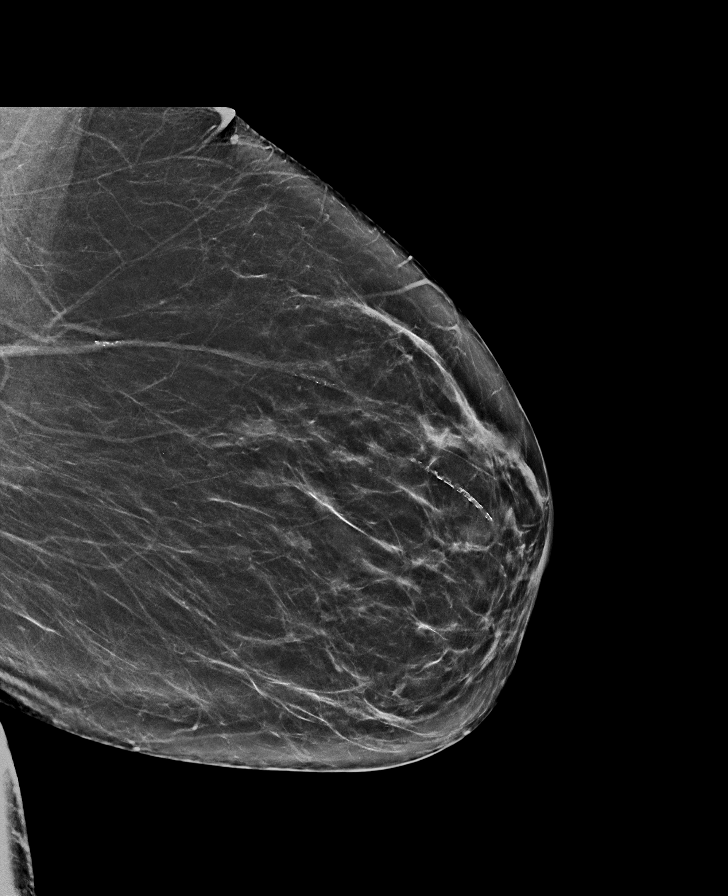

[L CC synth-2D]
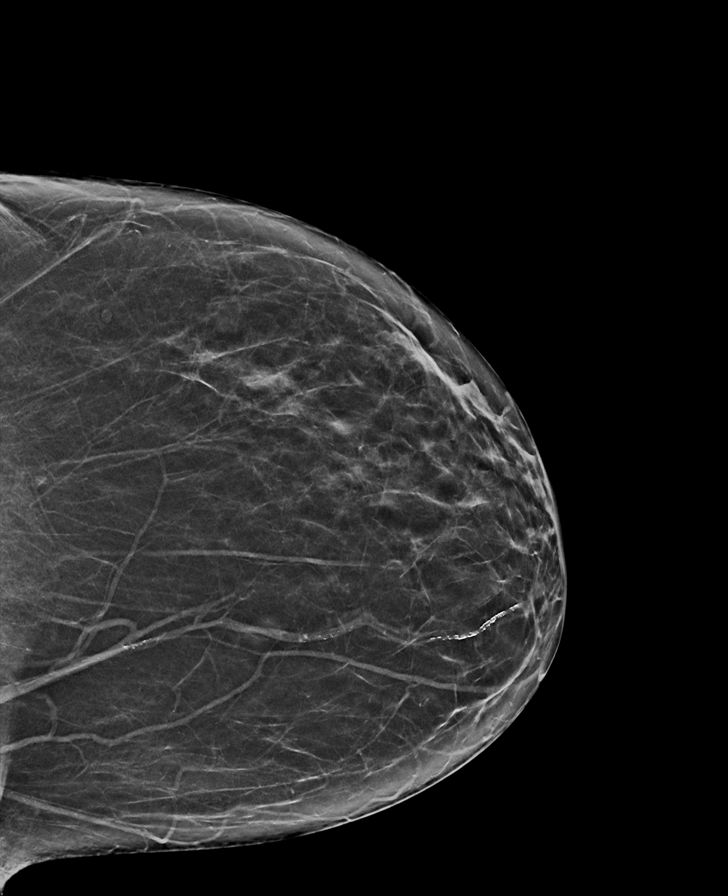

[R MLO synth-2D]
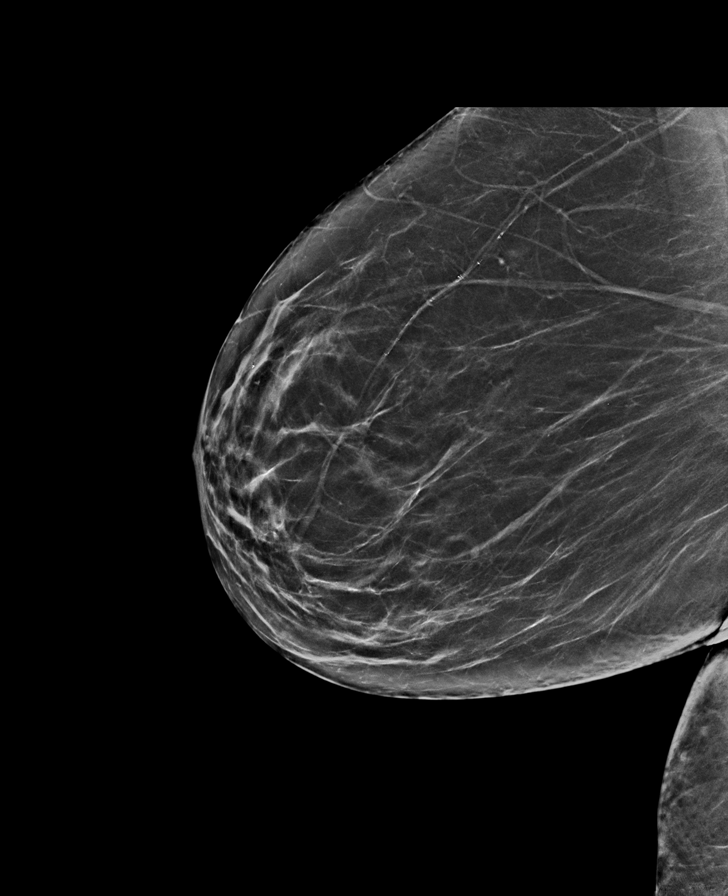

[8 of 29 positions shown; findings below may reference images not displayed]

ACR Breast Density Category b: There are scattered areas of
fibroglandular density.
FINDINGS: There are no findings suspicious for malignancy. Images were
processed with CAD.
IMPRESSION: No mammographic evidence of malignancy. A result letter of this
screening mammogram will be mailed directly to the patient.

RECOMMENDATION:
Screening mammogram in one year. (Code:97-6-RS4)

BI-RADS CATEGORY  1: Negative.

## 2017-08-12 DIAGNOSIS — I4891 Unspecified atrial fibrillation: Secondary | ICD-10-CM | POA: Diagnosis not present

## 2017-08-12 DIAGNOSIS — R079 Chest pain, unspecified: Secondary | ICD-10-CM | POA: Diagnosis not present

## 2017-08-17 ENCOUNTER — Ambulatory Visit: Payer: Medicare Other

## 2017-08-17 ENCOUNTER — Ambulatory Visit: Payer: Medicare Other | Admitting: Family Medicine

## 2017-08-22 ENCOUNTER — Encounter: Payer: Self-pay | Admitting: Family Medicine

## 2017-08-22 ENCOUNTER — Ambulatory Visit
Admission: RE | Admit: 2017-08-22 | Discharge: 2017-08-22 | Disposition: A | Discharge status: Home / Self Care | Payer: Medicare Other | Source: Ambulatory Visit | Attending: Family Medicine | Admitting: Family Medicine

## 2017-08-22 DIAGNOSIS — Z1231 Encounter for screening mammogram for malignant neoplasm of breast: Secondary | ICD-10-CM | POA: Insufficient documentation

## 2017-08-25 ENCOUNTER — Ambulatory Visit: Payer: Medicare Other

## 2017-08-30 ENCOUNTER — Encounter
Admission: RE | Admit: 2017-08-30 | Discharge: 2017-08-30 | Disposition: A | Discharge status: Home / Self Care | Payer: Medicare Other | Source: Ambulatory Visit | Attending: Otolaryngology | Admitting: Otolaryngology

## 2017-08-30 DIAGNOSIS — E785 Hyperlipidemia, unspecified: Secondary | ICD-10-CM | POA: Diagnosis not present

## 2017-08-30 DIAGNOSIS — D649 Anemia, unspecified: Secondary | ICD-10-CM | POA: Insufficient documentation

## 2017-08-30 DIAGNOSIS — Z01812 Encounter for preprocedural laboratory examination: Secondary | ICD-10-CM | POA: Insufficient documentation

## 2017-08-30 DIAGNOSIS — I129 Hypertensive chronic kidney disease with stage 1 through stage 4 chronic kidney disease, or unspecified chronic kidney disease: Secondary | ICD-10-CM | POA: Diagnosis not present

## 2017-08-30 DIAGNOSIS — I4891 Unspecified atrial fibrillation: Secondary | ICD-10-CM | POA: Insufficient documentation

## 2017-08-30 DIAGNOSIS — D125 Benign neoplasm of sigmoid colon: Secondary | ICD-10-CM | POA: Insufficient documentation

## 2017-08-30 DIAGNOSIS — D500 Iron deficiency anemia secondary to blood loss (chronic): Secondary | ICD-10-CM | POA: Insufficient documentation

## 2017-08-30 DIAGNOSIS — D1803 Hemangioma of intra-abdominal structures: Secondary | ICD-10-CM | POA: Insufficient documentation

## 2017-08-30 DIAGNOSIS — Z8673 Personal history of transient ischemic attack (TIA), and cerebral infarction without residual deficits: Secondary | ICD-10-CM | POA: Diagnosis not present

## 2017-08-30 LAB — POTASSIUM: POTASSIUM: 3.9 mmol/L (ref 3.5–5.1)

## 2017-08-30 NOTE — Patient Instructions (Signed)
Your procedure is scheduled on: 09/08/17 Report to Day Surgery. [facility] SECOND FLOOR To find out your arrival time please call [phone] between 1PM - 3PM on 09/07/17  Remember: Instructions that are not followed completely may result in serious medical risk, up to and including death, or upon the discretion of your surgeon and anesthesiologist your surgery may need to be rescheduled.     _X__ 1. Do not eat food after midnight the night before your procedure.                 No gum chewing or hard candies. You may drink clear liquids up to 2 hours                 before you are scheduled to arrive for your surgery- DO not drink clear                 liquids within 2 hours of the start of your surgery.                 Clear Liquids include:  water, apple juice without pulp, clear carbohydrate                 drink such as Clearfast of Gartorade, Black Coffee or Tea (Do not add                 anything to coffee or tea).     _X__ 2.  No Alcohol for 24 hours before or after surgery.   _X__ 3.  Do Not Smoke or use e-cigarettes For 24 Hours Prior to Your Surgery.                 Do not use any chewable tobacco products for at least 6 hours prior to                 surgery.  ____  4.  Bring all medications with you on the day of surgery if instructed.   __X__  5.  Notify your doctor if there is any change in your medical condition      (cold, fever, infections).     Do not wear jewelry, make-up, hairpins, clips or nail polish. Do not wear lotions, powders, or perfumes. You may wear deodorant. Do not shave 48 hours prior to surgery. Men may shave face and neck. Do not bring valuables to the hospital.    [facility] is not responsible for any belongings or valuables.  Contacts, dentures or bridgework may not be worn into surgery. Leave your suitcase in the car. After surgery it may be brought to your room. For patients admitted to the hospital, discharge  time is determined by your treatment team.   Patients discharged the day of surgery will not be allowed to drive home.   __X__ Take these medicines the morning of surgery with A SIP OF WATER:    1.AMIODARONE  2. AMLODIPINE  3. HYDRALAZINE  4.  5.  6.  ____ Fleet Enema (as directed)   ____ Use CHG Soap as directed  ____ Use inhalers on the day of surgery  ____ Stop metformin 2 days prior to surgery    ____ Take 1/2 of usual insulin dose the night before surgery. No insulin the morning          of surgery.   __X__ Stop Coumadin/Plavix/aspirin on  STOP ASPIRIN X7 DAYS BEFORE SURGERY AS INSTRUCTED BY CARDIAC DOCTOR ____ Stop Anti-inflammatories on  ____ Stop supplements until after surgery.    ____ Bring C-Pap to the hospital.

## 2017-09-05 ENCOUNTER — Ambulatory Visit: Payer: Medicare Other | Admitting: Family Medicine

## 2017-09-05 ENCOUNTER — Ambulatory Visit: Payer: Medicare Other

## 2017-09-08 ENCOUNTER — Encounter
Admission: RE | Disposition: A | Discharge status: Home / Self Care | Payer: Self-pay | Source: Ambulatory Visit | Attending: Otolaryngology

## 2017-09-08 ENCOUNTER — Observation Stay
Admission: RE | Admit: 2017-09-08 | Discharge: 2017-09-09 | Disposition: A | Discharge status: Home / Self Care | Payer: Medicare Other | Source: Ambulatory Visit | Attending: Otolaryngology | Admitting: Otolaryngology

## 2017-09-08 ENCOUNTER — Ambulatory Visit: Payer: Medicare Other | Admitting: Anesthesiology

## 2017-09-08 ENCOUNTER — Encounter: Payer: Self-pay | Admitting: *Deleted

## 2017-09-08 ENCOUNTER — Other Ambulatory Visit: Payer: Self-pay

## 2017-09-08 DIAGNOSIS — D117 Benign neoplasm of other major salivary glands: Secondary | ICD-10-CM | POA: Diagnosis not present

## 2017-09-08 DIAGNOSIS — Z7982 Long term (current) use of aspirin: Secondary | ICD-10-CM | POA: Diagnosis not present

## 2017-09-08 DIAGNOSIS — M1990 Unspecified osteoarthritis, unspecified site: Secondary | ICD-10-CM | POA: Diagnosis not present

## 2017-09-08 DIAGNOSIS — I10 Essential (primary) hypertension: Secondary | ICD-10-CM | POA: Insufficient documentation

## 2017-09-08 DIAGNOSIS — D119 Benign neoplasm of major salivary gland, unspecified: Secondary | ICD-10-CM | POA: Diagnosis not present

## 2017-09-08 DIAGNOSIS — Z79899 Other long term (current) drug therapy: Secondary | ICD-10-CM | POA: Insufficient documentation

## 2017-09-08 DIAGNOSIS — E785 Hyperlipidemia, unspecified: Secondary | ICD-10-CM | POA: Diagnosis not present

## 2017-09-08 DIAGNOSIS — D649 Anemia, unspecified: Secondary | ICD-10-CM | POA: Diagnosis not present

## 2017-09-08 DIAGNOSIS — Z9089 Acquired absence of other organs: Secondary | ICD-10-CM

## 2017-09-08 DIAGNOSIS — I129 Hypertensive chronic kidney disease with stage 1 through stage 4 chronic kidney disease, or unspecified chronic kidney disease: Secondary | ICD-10-CM | POA: Diagnosis not present

## 2017-09-08 DIAGNOSIS — R221 Localized swelling, mass and lump, neck: Secondary | ICD-10-CM | POA: Diagnosis not present

## 2017-09-08 DIAGNOSIS — N189 Chronic kidney disease, unspecified: Secondary | ICD-10-CM | POA: Diagnosis not present

## 2017-09-08 DIAGNOSIS — R222 Localized swelling, mass and lump, trunk: Secondary | ICD-10-CM | POA: Diagnosis not present

## 2017-09-08 DIAGNOSIS — Z9889 Other specified postprocedural states: Secondary | ICD-10-CM

## 2017-09-08 DIAGNOSIS — I2510 Atherosclerotic heart disease of native coronary artery without angina pectoris: Secondary | ICD-10-CM | POA: Diagnosis not present

## 2017-09-08 HISTORY — PX: SUBMANDIBULAR GLAND EXCISION: SHX2456

## 2017-09-08 SURGERY — EXCISION, SUBMANDIBULAR GLAND
Anesthesia: General | Laterality: Left

## 2017-09-08 MED ORDER — ACETAMINOPHEN 650 MG RE SUPP: 650.0000 mg | RECTAL | Status: DC | PRN

## 2017-09-08 MED ORDER — FAMOTIDINE 20 MG PO TABS
20.0000 mg | ORAL_TABLET | Freq: Once | ORAL | Status: AC
  Administered 2017-09-08: 20 mg via ORAL

## 2017-09-08 MED ORDER — ZOLPIDEM TARTRATE 5 MG PO TABS: 5.0000 mg | ORAL_TABLET | Freq: Every evening | ORAL | Status: DC | PRN

## 2017-09-08 MED ORDER — SENNOSIDES-DOCUSATE SODIUM 8.6-50 MG PO TABS: 1.0000 | ORAL_TABLET | Freq: Every evening | ORAL | Status: DC | PRN

## 2017-09-08 MED ORDER — LIDOCAINE HCL (CARDIAC) 20 MG/ML IV SOLN
INTRAVENOUS | Status: DC | PRN
  Administered 2017-09-08: 50 mg via INTRAVENOUS

## 2017-09-08 MED ORDER — AMIODARONE HCL 200 MG PO TABS
200.0000 mg | ORAL_TABLET | ORAL | Status: DC
  Administered 2017-09-09: 200 mg via ORAL
  Filled 2017-09-08: qty 1

## 2017-09-08 MED ORDER — AMLODIPINE BESYLATE 5 MG PO TABS: 5.0000 mg | ORAL_TABLET | Freq: Every day | ORAL | Status: DC

## 2017-09-08 MED ORDER — LACTATED RINGERS IV SOLN
INTRAVENOUS | Status: DC
  Administered 2017-09-08: 07:00:00 via INTRAVENOUS

## 2017-09-08 MED ORDER — FENTANYL CITRATE (PF) 100 MCG/2ML IJ SOLN
INTRAMUSCULAR | Status: DC | PRN
  Administered 2017-09-08: 50 ug via INTRAVENOUS

## 2017-09-08 MED ORDER — DEXTROSE-NACL 5-0.45 % IV SOLN
INTRAVENOUS | Status: DC
Start: 2017-09-08 — End: 2017-09-08
  Administered 2017-09-08: 11:00:00 via INTRAVENOUS

## 2017-09-08 MED ORDER — HYDROMORPHONE HCL 1 MG/ML IJ SOLN
INTRAMUSCULAR | Status: DC | PRN
  Administered 2017-09-08: .2 mg via INTRAVENOUS

## 2017-09-08 MED ORDER — PHENYLEPHRINE HCL 10 MG/ML IJ SOLN
INTRAMUSCULAR | Status: DC | PRN
  Administered 2017-09-08: 100 ug via INTRAVENOUS

## 2017-09-08 MED ORDER — ONDANSETRON HCL 4 MG/2ML IJ SOLN
INTRAMUSCULAR | Status: AC
  Filled 2017-09-08: qty 2

## 2017-09-08 MED ORDER — PROPOFOL 10 MG/ML IV BOLUS
INTRAVENOUS | Status: AC
  Filled 2017-09-08: qty 20

## 2017-09-08 MED ORDER — LACTATED RINGERS IV SOLN
INTRAVENOUS | Status: DC | PRN
  Administered 2017-09-08: 07:00:00 via INTRAVENOUS

## 2017-09-08 MED ORDER — LISINOPRIL 20 MG PO TABS: 20.0000 mg | ORAL_TABLET | Freq: Every day | ORAL | Status: DC

## 2017-09-08 MED ORDER — ONDANSETRON HCL 4 MG/2ML IJ SOLN
INTRAMUSCULAR | Status: DC | PRN
  Administered 2017-09-08: 4 mg via INTRAVENOUS

## 2017-09-08 MED ORDER — PROPOFOL 10 MG/ML IV BOLUS
INTRAVENOUS | Status: DC | PRN
  Administered 2017-09-08: 130 mg via INTRAVENOUS

## 2017-09-08 MED ORDER — LIDOCAINE-EPINEPHRINE 1 %-1:100000 IJ SOLN
INTRAMUSCULAR | Status: DC | PRN
  Administered 2017-09-08: 6 mL

## 2017-09-08 MED ORDER — BACITRACIN ZINC 500 UNIT/GM EX OINT
1.0000 "application " | TOPICAL_OINTMENT | Freq: Three times a day (TID) | CUTANEOUS | Status: DC
  Administered 2017-09-08 – 2017-09-09 (×3): 1 via TOPICAL
  Filled 2017-09-08: qty 28.35

## 2017-09-08 MED ORDER — SUCCINYLCHOLINE CHLORIDE 20 MG/ML IJ SOLN
INTRAMUSCULAR | Status: DC | PRN
  Administered 2017-09-08: 100 mg via INTRAVENOUS

## 2017-09-08 MED ORDER — HYDROCHLOROTHIAZIDE 25 MG PO TABS: 25.0000 mg | ORAL_TABLET | Freq: Every day | ORAL | Status: DC

## 2017-09-08 MED ORDER — EPHEDRINE SULFATE 50 MG/ML IJ SOLN
INTRAMUSCULAR | Status: DC | PRN
  Administered 2017-09-08: 10 mg via INTRAVENOUS

## 2017-09-08 MED ORDER — HYDROCODONE-ACETAMINOPHEN 7.5-325 MG/15ML PO SOLN
10.0000 mL | ORAL | Status: DC | PRN
  Administered 2017-09-08 – 2017-09-09 (×3): 15 mL via ORAL
  Filled 2017-09-08 (×3): qty 15

## 2017-09-08 MED ORDER — ONDANSETRON HCL 4 MG/2ML IJ SOLN: 4.0000 mg | Freq: Once | INTRAMUSCULAR | Status: DC | PRN

## 2017-09-08 MED ORDER — MIDAZOLAM HCL 2 MG/2ML IJ SOLN
INTRAMUSCULAR | Status: DC | PRN
  Administered 2017-09-08: 2 mg via INTRAVENOUS

## 2017-09-08 MED ORDER — DEXAMETHASONE SODIUM PHOSPHATE 10 MG/ML IJ SOLN
INTRAMUSCULAR | Status: DC | PRN
  Administered 2017-09-08: 10 mg via INTRAVENOUS

## 2017-09-08 MED ORDER — HYDROMORPHONE HCL 1 MG/ML IJ SOLN
INTRAMUSCULAR | Status: AC
  Filled 2017-09-08: qty 1

## 2017-09-08 MED ORDER — HYDRALAZINE HCL 50 MG PO TABS
50.0000 mg | ORAL_TABLET | Freq: Two times a day (BID) | ORAL | Status: DC
  Administered 2017-09-08: 50 mg via ORAL
  Filled 2017-09-08: qty 1

## 2017-09-08 MED ORDER — FERROUS SULFATE 325 (65 FE) MG PO TABS
325.0000 mg | ORAL_TABLET | Freq: Three times a day (TID) | ORAL | Status: DC
  Administered 2017-09-08 (×2): 325 mg via ORAL
  Filled 2017-09-08 (×2): qty 1

## 2017-09-08 MED ORDER — LIDOCAINE-EPINEPHRINE 1 %-1:100000 IJ SOLN
INTRAMUSCULAR | Status: AC
  Filled 2017-09-08: qty 1

## 2017-09-08 MED ORDER — FENTANYL CITRATE (PF) 100 MCG/2ML IJ SOLN
INTRAMUSCULAR | Status: AC
  Filled 2017-09-08: qty 2

## 2017-09-08 MED ORDER — MORPHINE SULFATE (PF) 2 MG/ML IV SOLN: 2.0000 mg | INTRAVENOUS | Status: DC | PRN

## 2017-09-08 MED ORDER — FAMOTIDINE 20 MG PO TABS
ORAL_TABLET | ORAL | Status: AC
  Filled 2017-09-08: qty 1

## 2017-09-08 MED ORDER — LISINOPRIL-HYDROCHLOROTHIAZIDE 20-25 MG PO TABS: 1.0000 | ORAL_TABLET | Freq: Two times a day (BID) | ORAL | Status: DC

## 2017-09-08 MED ORDER — ONDANSETRON HCL 4 MG/2ML IJ SOLN
4.0000 mg | INTRAMUSCULAR | Status: DC | PRN
  Administered 2017-09-08: 4 mg via INTRAVENOUS
  Filled 2017-09-08: qty 2

## 2017-09-08 MED ORDER — ONDANSETRON HCL 4 MG PO TABS: 4.0000 mg | ORAL_TABLET | ORAL | Status: DC | PRN

## 2017-09-08 MED ORDER — FENTANYL CITRATE (PF) 100 MCG/2ML IJ SOLN: 25.0000 ug | INTRAMUSCULAR | Status: DC | PRN

## 2017-09-08 MED ORDER — MIDAZOLAM HCL 2 MG/2ML IJ SOLN
INTRAMUSCULAR | Status: AC
  Filled 2017-09-08: qty 2

## 2017-09-08 MED ORDER — BACITRACIN ZINC 500 UNIT/GM EX OINT
TOPICAL_OINTMENT | CUTANEOUS | Status: AC
  Filled 2017-09-08: qty 28.35

## 2017-09-08 MED ORDER — ACETAMINOPHEN 160 MG/5ML PO SOLN
650.0000 mg | ORAL | Status: DC | PRN
  Filled 2017-09-08: qty 20.3

## 2017-09-08 SURGICAL SUPPLY — 39 items
BLADE SURG 15 STRL LF DISP TIS (BLADE) ×1 IMPLANT
BLADE SURG 15 STRL SS (BLADE) ×1
CANISTER SUCT 1200ML W/VALVE (MISCELLANEOUS) ×2 IMPLANT
CNTNR SPEC 2.5X3XGRAD LEK (MISCELLANEOUS)
CONT SPEC 4OZ STER OR WHT (MISCELLANEOUS)
CONTAINER SPEC 2.5X3XGRAD LEK (MISCELLANEOUS) IMPLANT
CORD BIP STRL DISP 12FT (MISCELLANEOUS) ×2 IMPLANT
DERMABOND ADVANCED (GAUZE/BANDAGES/DRESSINGS)
DERMABOND ADVANCED .7 DNX12 (GAUZE/BANDAGES/DRESSINGS) IMPLANT
DRAIN TLS ROUND 10FR (DRAIN) IMPLANT
DRAPE MAG INST 16X20 L/F (DRAPES) ×2 IMPLANT
DRSG TEGADERM 2-3/8X2-3/4 SM (GAUZE/BANDAGES/DRESSINGS) IMPLANT
ELECT NEEDLE 20X.3 GREEN (MISCELLANEOUS)
ELECT REM PT RETURN 9FT ADLT (ELECTROSURGICAL) ×2
ELECTRODE NEEDLE 20X.3 GREEN (MISCELLANEOUS) IMPLANT
ELECTRODE REM PT RTRN 9FT ADLT (ELECTROSURGICAL) ×1 IMPLANT
FORCEPS JEWEL BIP 4-3/4 STR (INSTRUMENTS) ×2 IMPLANT
GLOVE BIO SURGEON STRL SZ7.5 (GLOVE) ×4 IMPLANT
GOWN STRL REUS W/ TWL LRG LVL3 (GOWN DISPOSABLE) ×3 IMPLANT
GOWN STRL REUS W/TWL LRG LVL3 (GOWN DISPOSABLE) ×3
HEMOSTAT SURGICEL 2X3 (HEMOSTASIS) ×2 IMPLANT
HOOK STAY BLUNT/RETRACTOR 5M (MISCELLANEOUS) IMPLANT
LABEL OR SOLS (LABEL) IMPLANT
NS IRRIG 500ML POUR BTL (IV SOLUTION) ×2 IMPLANT
PACK HEAD/NECK (MISCELLANEOUS) ×2 IMPLANT
PROBE MONO 100X0.75 ELECT 1.9M (MISCELLANEOUS) IMPLANT
SHEARS HARMONIC 9CM CVD (BLADE) ×2 IMPLANT
SPONGE KITTNER 5P (MISCELLANEOUS) ×2 IMPLANT
SPONGE XRAY 4X4 16PLY STRL (MISCELLANEOUS) IMPLANT
STRIP CLOSURE SKIN 1/4X4 (GAUZE/BANDAGES/DRESSINGS) IMPLANT
SUT ETHILON 4-0 (SUTURE) ×1
SUT ETHILON 4-0 FS2 18XMFL BLK (SUTURE) ×1
SUT PROLENE 6 0 P 1 18 (SUTURE) IMPLANT
SUT SILK 2 0 (SUTURE) ×1
SUT SILK 2 0 SH (SUTURE) ×2 IMPLANT
SUT SILK 2-0 18XBRD TIE 12 (SUTURE) ×1 IMPLANT
SUT VIC AB 4-0 RB1 18 (SUTURE) ×2 IMPLANT
SUTURE ETHLN 4-0 FS2 18XMF BLK (SUTURE) ×1 IMPLANT
SYSTEM CHEST DRAIN TLS 7FR (DRAIN) IMPLANT

## 2017-09-08 NOTE — Progress Notes (Signed)
Per Dr. Pryor Ochoa okay to hold BP meds as unsure if pt took this AM

## 2017-09-08 NOTE — Op Note (Signed)
..  09/08/2017  8:37 AM    Kristina Henson  [id number]   Pre-Op Dx: Left submandibular gland mass  Post-op Dx: same  Proc: Left Submandibular Gland Excision  Surg: Tedd Cottrill  Asst:  Richardson Landry, Scott  Anes:  GOT  EBL: <29ml  Comp:  none  Findings:  Large left submandibular gland mass, firm.  Lingual nerve identified and preserved.  Facial artery preserved and branches divided.  Procedure:  After the patient was identified in hold and the history and physical and consent was reviewed and updated. The patient was marked in the normal fashion in an existing skin crease of her left neck just below the level of the submandibular gland approximately 2 finger lengths below the body of the mandible. The patient was next taken to the operating room and placed in a supine position. General endotracheal anesthesia was induced in the normal fashion with short term paralytic. The patient's left neck was neck injected with 6cc's of 1% lidocaine with 1:100,000 Epinephrine. The patient was next prepped and draped in a sterile normal fashion.  At this time, a 15 blade scalpel was used to make a skin incision along the previously marked neck crease in the left neck after the proper time out was performed. Dissection was carefully performed through the subcutaneous tissues with combination of Bovie electrocautery and blunt dissection. The platysma muscle was divided with harmonic scalpel.  A nerve stimulator was used to ensure no injury to the marginal mandibular nerve was made. Blunt dissection inferiorly along the inferior edge of the submandibular gland was performed.  The fascia overlying the gland was divided and retracted superiorly with a Sin retractor.  Continued blunt and sharp dissection with Harmonic Scalpel was performed circumferentially around the gland.  The facial arter ywas noted posteriorly and feeding branches were divided with harmonic scalpel.  The Lingual nerve along the  superior aspect of the gland and duct was identified.  The submandibular ganglion was divided with Harmonic Scalpel with care taken to avoid injury to the Lingual nerve.  The remaining attachments of the gland deeply were next carefully transected with the Harmonic scalpel again with taking care to avoid injury to the hypoglossal and lingual nerves.  A separate branch of the facial artery was also divided deep and on the posterior aspect of the gland.  At this time, with the gland pedicled on Wharton's duct and this was divided with Harmonic scalpel.  Further manipulation of the wound failed to reveal any additional masses or lesions.  At this time, the wound was copiously irrigated with sterile saline. Meticulous hemostasis with bipolar was obtained. Surgicel was placed in the wound bed. The wound was then closed in a multilayered fashion with vicryl for subcutaneous tissues and Dermabond for skin closure  At this time the patient was extubated and taken to PACU in good condition.    Dispo: PACU in good condition.  Plan:  Ice, elevation, analgesia.  Kristina Henson 09/08/2017 8:37 AM

## 2017-09-08 NOTE — H&P (Signed)
..  History and Physical paper copy reviewed and updated date of procedure and will be scanned into system.  Patient seen and examined and new H&P written.  Patient marked.

## 2017-09-08 NOTE — Anesthesia Post-op Follow-up Note (Signed)
Anesthesia QCDR form completed.        

## 2017-09-08 NOTE — Anesthesia Procedure Notes (Signed)
Procedure Name: Intubation Date/Time: 09/08/2017 7:34 AM Performed by: Justus Memory, CRNA Pre-anesthesia Checklist: Patient identified, Patient being monitored, Timeout performed, Emergency Drugs available and Suction available Patient Re-evaluated:Patient Re-evaluated prior to induction Oxygen Delivery Method: Circle system utilized Preoxygenation: Pre-oxygenation with 100% oxygen Induction Type: IV induction Ventilation: Mask ventilation without difficulty Laryngoscope Size: Mac and 3 Grade View: Grade I Tube type: Oral Rae Tube size: 7.0 mm Number of attempts: 1 Airway Equipment and Method: Stylet Placement Confirmation: ETT inserted through vocal cords under direct vision,  positive ETCO2 and breath sounds checked- equal and bilateral Secured at: 21 cm Tube secured with: Tape Dental Injury: Teeth and Oropharynx as per pre-operative assessment

## 2017-09-08 NOTE — Anesthesia Preprocedure Evaluation (Signed)
Anesthesia Evaluation  Patient identified by MRN, date of birth, ID band Patient awake    Reviewed: Allergy & Precautions, H&P , NPO status , Patient's Chart, lab work & pertinent test results, reviewed documented beta blocker date and time   Airway Mallampati: II  TM Distance: >3 FB Neck ROM: full    Dental  (+) Teeth Intact   Pulmonary neg pulmonary ROS, pneumonia, resolved, former smoker,    Pulmonary exam normal        Cardiovascular Exercise Tolerance: Poor hypertension, On Medications + CAD  negative cardio ROS Normal cardiovascular exam+ dysrhythmias Atrial Fibrillation  Rhythm:regular Rate:Normal     Neuro/Psych CVA, Residual Symptoms negative neurological ROS  negative psych ROS   GI/Hepatic negative GI ROS, Neg liver ROS,   Endo/Other  negative endocrine ROS  Renal/GU CRFRenal diseasenegative Renal ROS  negative genitourinary   Musculoskeletal   Abdominal   Peds  Hematology negative hematology ROS (+) anemia ,   Anesthesia Other Findings Past Medical History: No date: Anemia No date: Arthritis     Comment:  BACK-LUMBAR No date: Atrial fibrillation (HCC) No date: Coronary artery disease No date: Dysrhythmia No date: Hypertension No date: Mass in neck     Comment:  lt side 2016: Pneumonia 2012: Stroke [facility]) 2012: Stroke [facility]) Past Surgical History: No date: ABDOMINAL HYSTERECTOMY     Comment:  Total No date: cardiac catherization 11/18/2015: COLONOSCOPY WITH PROPOFOL; N/A     Comment:  Procedure: COLONOSCOPY WITH PROPOFOL;  Surgeon: Lucilla Lame, MD;  Location: [facility] ENDOSCOPY;  Service: Endoscopy;              Laterality: N/A; No date: CORONARY ANGIOPLASTY 04/23/2016: ESOPHAGOGASTRODUODENOSCOPY (EGD) WITH PROPOFOL; N/A     Comment:  Procedure: ESOPHAGOGASTRODUODENOSCOPY (EGD) WITH               PROPOFOL;  Surgeon: Lucilla Lame, MD;  Location: [place];   Service: Endoscopy;  Laterality: N/A;                small bowel bx gastric antrum bx No date: throat biopsy BMI    Body Mass Index:  31.09 kg/m     Reproductive/Obstetrics negative OB ROS                             Anesthesia Physical Anesthesia Plan  ASA: III  Anesthesia Plan: General ETT   Post-op Pain Management:    Induction:   PONV Risk Score and Plan:   Airway Management Planned: Video Laryngoscope Planned  Additional Equipment:   Intra-op Plan:   Post-operative Plan:   Informed Consent: I have reviewed the patients History and Physical, chart, labs and discussed the procedure including the risks, benefits and alternatives for the proposed anesthesia with the patient or authorized representative who has indicated his/her understanding and acceptance.   Dental Advisory Given  Plan Discussed with: CRNA  Anesthesia Plan Comments:         Anesthesia Quick Evaluation

## 2017-09-08 NOTE — Transfer of Care (Signed)
Immediate Anesthesia Transfer of Care Note  Patient: Kristina Henson  Procedure(s) Performed: EXCISION SUBMANDIBULAR GLAND (Left )  Patient Location: PACU  Anesthesia Type:General  Level of Consciousness: sedated  Airway & Oxygen Therapy: Patient Spontanous Breathing and Patient connected to nasal cannula oxygen  Post-op Assessment: Report given to RN and Post -op Vital signs reviewed and stable  Post vital signs: Reviewed and stable  Last Vitals:  Vitals:   09/08/17 0618 09/08/17 0848  BP: (!) 148/64 119/65  Pulse: 72 65  Resp: 16 14  Temp: 36.6 C 36.6 C  SpO2: 100% 97%    Last Pain:  Vitals:   09/08/17 0848  TempSrc:   PainSc: 0-No pain         Complications: No apparent anesthesia complications

## 2017-09-09 ENCOUNTER — Encounter: Payer: Self-pay | Admitting: Otolaryngology

## 2017-09-09 DIAGNOSIS — Z7982 Long term (current) use of aspirin: Secondary | ICD-10-CM | POA: Diagnosis not present

## 2017-09-09 DIAGNOSIS — D117 Benign neoplasm of other major salivary glands: Secondary | ICD-10-CM | POA: Diagnosis not present

## 2017-09-09 DIAGNOSIS — I10 Essential (primary) hypertension: Secondary | ICD-10-CM | POA: Diagnosis not present

## 2017-09-09 DIAGNOSIS — D649 Anemia, unspecified: Secondary | ICD-10-CM | POA: Diagnosis not present

## 2017-09-09 DIAGNOSIS — Z79899 Other long term (current) drug therapy: Secondary | ICD-10-CM | POA: Diagnosis not present

## 2017-09-09 LAB — SURGICAL PATHOLOGY

## 2017-09-09 MED ORDER — HYDROCODONE-ACETAMINOPHEN 5-325 MG PO TABS: 1.0000 | ORAL_TABLET | ORAL | 0 refills | Status: DC | PRN

## 2017-09-09 MED ORDER — ONDANSETRON HCL 4 MG PO TABS: 4.0000 mg | ORAL_TABLET | Freq: Three times a day (TID) | ORAL | 0 refills | Status: DC | PRN

## 2017-09-09 NOTE — Anesthesia Postprocedure Evaluation (Signed)
Anesthesia Post Note  Patient: Kristina Henson  Procedure(s) Performed: EXCISION SUBMANDIBULAR GLAND (Left )  Patient location during evaluation: PACU Anesthesia Type: General Level of consciousness: awake and alert Pain management: pain level controlled Vital Signs Assessment: post-procedure vital signs reviewed and stable Respiratory status: spontaneous breathing, nonlabored ventilation, respiratory function stable and patient connected to nasal cannula oxygen Cardiovascular status: blood pressure returned to baseline and stable Postop Assessment: no apparent nausea or vomiting Anesthetic complications: no     Last Vitals:  Vitals:   09/09/17 0312 09/09/17 0354  BP: (!) 143/62 137/64  Pulse: 64 (!) 57  Resp: 18   Temp: 36.5 C 36.7 C  SpO2: 95% 98%    Last Pain:  Vitals:   09/09/17 0459  TempSrc:   PainSc: Asleep                 Molli Barrows

## 2017-09-09 NOTE — Final Progress Note (Signed)
..   09/09/2017 7:19 AM  [name] [id number]  Post-Op Day 1    Temp:  [97.5 F (36.4 C)-98.6 F (37 C)] 98.1 F (36.7 C) (12/14 0354) Pulse Rate:  [57-65] 57 (12/14 0354) Resp:  [13-20] 18 (12/14 0312) BP: (119-158)/(56-67) 137/64 (12/14 0354) SpO2:  [95 %-100 %] 98 % (12/14 0354),     Intake/Output Summary (Last 24 hours) at 09/09/2017 0719 Last data filed at 09/08/2017 2205 Gross per 24 hour  Intake 2244 ml  Output 510 ml  Net 1734 ml    No results found for this or any previous visit (from the past 24 hour(s)).  SUBJECTIVE:  No acute events.  Tolerating diet.  Ambulating  OBJECTIVE:  GEN-  NAD NECK-  Incision c/d/i  IMPRESSION:  S/p left submandibular gland excision POD #1  PLAN:  OK to discharge home.  Follow up in 1 week.  Casondra Gasca 09/09/2017, 7:19 AM

## 2017-09-09 NOTE — Care Management Obs Status (Signed)
[place] NOTIFICATION   Patient Details  Name: Kristina Henson MRN: [id number] Date of Birth: 04/20/1956   Medicare Observation Status Notification Given:  No(admitted obs less than 24 hours)    Beverly Sessions, RN 09/09/2017, 9:51 AM

## 2017-09-09 NOTE — Progress Notes (Signed)
IV was removed. Discharge instructions, follow-up appointments, and prescriptions were provided to the pt. All questions answered. The pt is waiting for her daughter to arrive for transport.

## 2017-09-12 ENCOUNTER — Ambulatory Visit: Payer: Medicare Other

## 2017-09-28 ENCOUNTER — Ambulatory Visit: Payer: Medicare Other | Admitting: Urology

## 2017-09-28 NOTE — Progress Notes (Deleted)
09/28/2017 12:15 AM   Kristina Henson November 09, 1955 [id number]  Referring provider: Valerie Roys, DO [place], [place] [address]  No chief complaint on file.   HPI: 62 yo WF with a history of recurrent UTI's and vaginal atrophy who presents today for a three month follow up.   Background history Patient is a 73 -year-old Caucasian female who is referred to [place] by, Dr. Valerie Roys, for recurrent urinary tract infections.  Patient states that she has had several urinary tract infections over the last year.  [place]   Reviewing her records,  she has had 2 documented positive urinary tract infections for Escherichia coli.   Her symptoms with a urinary tract infection consist of urgency, frequency foul odor and a headache.  Today, she states she is feeling better than she was prior to starting the antibiotics.   Currently, she denies dysuria, gross hematuria, suprapubic pain, back pain, abdominal pain or flank pain.  She has not had any recent fevers, chills, nausea or vomiting.   She is experiencing urinary frequency.  Her CATH UA today is negative.  She does not have a history of nephrolithiasis, GU surgery or GU trauma.   No family history of kidney stones.  She is not sexually active.   She is postmenopausal.   She denies constipation and/or diarrhea.    She does not engage in good perineal hygiene. She does not take tub baths.  She does not have incontinence.  She is not having pain with bladder filling.  She had a contrast CT in 04/2016 which noted Adrenals/Urinary Tract: No masses identified. No evidence of ureteral calculi or hydronephrosis. Unopacified urinary bladder is unremarkable in appearance.  MRI in 11/2016 noted Adrenals/Urinary Tract: Normal adrenal glands. Bilateral too small to characterize renal lesions. Left extrarenal pelvis. No hydronephrosis.  She is drinking 4 to 5 glasses of water daily.   Rare occasions Ginger Ale.  One or two cups of coffee  during the winter.  Takes cranberry tablets x 2 years.   No daily probiotics.   Reviewed referral notes - 06/02/2017 + urine culture for E.coli given septra ds;  06/16/2017 + urine culture for E.coli given Cipro  Today, she ***.     PMH: Past Medical History:  Diagnosis Date  . Anemia   . Arthritis    BACK-LUMBAR  . Atrial fibrillation ([place])   . Coronary artery disease   . Dysrhythmia   . Hypertension   . Mass in neck    lt side  . Pneumonia 2016  . Stroke ([place]) 2012  . Stroke [facility]) 2012    Surgical History: Past Surgical History:  Procedure Laterality Date  . ABDOMINAL HYSTERECTOMY     Total  . cardiac catherization    . COLONOSCOPY WITH PROPOFOL N/A 11/18/2015   Procedure: COLONOSCOPY WITH PROPOFOL;  Surgeon: Lucilla Lame, MD;  Location: [facility] END[facility];  Service: Endoscopy;  Laterality: N/A;  . CORONARY ANGIOPLASTY    . ESOPHAGOGASTRODUODENOSCOPY (EGD) WITH PROPOFOL N/A 04/23/2016   Procedure: ESOPHAGOGASTRODUODENOSCOPY (EGD) WITH PROPOFOL;  Surgeon: Lucilla Lame, MD;  Location: [place];  Service: Endoscopy;  Laterality: N/A;  small bowel bx gastric antrum bx  . SUBMANDIBULAR GLAND EXCISION Left 09/08/2017   Procedure: EXCISION SUBMANDIBULAR GLAND;  Surgeon: Carloyn Manner, MD;  Location: [facility];  Service: ENT;  Laterality: Left;  . throat biopsy      Home Medications:  Allergies as of 09/28/2017  Reactions   Amoxicillin Other (See Comments)   Joint pains      Medication List        Accurate as of 09/28/17 12:15 AM. Always use your most recent med list.          amiodarone 200 MG tablet Commonly known as:  PACERONE Take 200 mg by mouth every morning.   amLODipine 5 MG tablet Commonly known as:  NORVASC Take 1 tablet (5 mg total) by mouth daily.   aspirin EC 81 MG tablet Take 81 mg by mouth daily.   CRANBERRY PO Take 1 tablet by mouth daily.   ferrous sulfate 325 (65 FE) MG EC tablet Take 1 tablet (325 mg total) by mouth 3 (three)  times daily with meals.   hydrALAZINE 50 MG tablet Commonly known as:  APRESOLINE Take 50 mg by mouth 2 (two) times daily.   HYDROcodone-acetaminophen 5-325 MG tablet Commonly known as:  NORCO Take 1 tablet by mouth every 4 (four) hours as needed for moderate pain.   lisinopril-hydrochlorothiazide 20-25 MG tablet Commonly known as:  PRINZIDE,ZESTORETIC Take 2 tablets by mouth daily.   multivitamin with minerals tablet Take 1 tablet by mouth daily.   ondansetron 4 MG tablet Commonly known as:  ZOFRAN Take 1 tablet (4 mg total) by mouth every 8 (eight) hours as needed for up to 10 doses for nausea or vomiting.   vitamin C 100 MG tablet Take 100 mg by mouth daily.       Allergies:  Allergies  Allergen Reactions  . Amoxicillin Other (See Comments)    Joint pains    Family History: Family History  Problem Relation Age of Onset  . Arthritis Mother   . Cancer Mother   . Hypertension Sister   . Asthma Brother   . Dementia Brother   . Breast cancer Neg Hx   . Kidney cancer Neg Hx   . Bladder Cancer Neg Hx     Social History:  reports that she quit smoking about 41 years ago. she has never used smokeless tobacco. She reports that she drinks about 0.6 oz of alcohol per week. She reports that she does not use drugs.  ROS:                                        Physical Exam: There were no vitals taken for this visit.  Constitutional: Well nourished. Alert and oriented, No acute distress. HEENT: [place] AT, moist mucus membranes. Trachea midline, no masses. Cardiovascular: No clubbing, cyanosis, or edema. Respiratory: Normal respiratory effort, no increased work of breathing. GI: Abdomen is soft, non tender, non distended, no abdominal masses. Liver and spleen not palpable.  No hernias appreciated.  Stool sample for occult testing is not indicated.   GU: No CVA tenderness.  No bladder fullness or masses.  Atrophic external genitalia, normal pubic  hair distribution, no lesions.  Normal urethral meatus, no lesions, no prolapse, no discharge.   Urethral caruncle is noted.  No bladder fullness, tenderness or masses.  Pale vagina mucosa, poor estrogen effect, no discharge, no lesions, good pelvic support, Grade II cystocele is noted .  Rectocele is not noted.  Cervix, uterus and adnexa are surgically absent.  Anus and perineum are without rashes or lesions.    Skin: No rashes, bruises or suspicious lesions. Lymph: No cervical or inguinal adenopathy. Neurologic: Grossly intact, no focal deficits,  moving all 4 extremities. Psychiatric: Normal mood and affect.  Constitutional: Well nourished. Alert and oriented, No acute distress. HEENT: [place] AT, moist mucus membranes. Trachea midline, no masses. Cardiovascular: No clubbing, cyanosis, or edema. Respiratory: Normal respiratory effort, no increased work of breathing. GI: Abdomen is soft, non tender, non distended, no abdominal masses. Liver and spleen not palpable.  No hernias appreciated.  Stool sample for occult testing is not indicated.   GU: No CVA tenderness.  No bladder fullness or masses.  Patient with circumcised/uncircumcised phallus. ***Foreskin easily retracted***  Urethral meatus is patent.  No penile discharge. No penile lesions or rashes. Scrotum without lesions, cysts, rashes and/or edema.  Testicles are located scrotally bilaterally. No masses are appreciated in the testicles. Left and right epididymis are normal. Rectal: Patient with  normal sphincter tone. Anus and perineum without scarring or rashes. No rectal masses are appreciated. Prostate is approximately *** grams, *** nodules are appreciated. Seminal vesicles are normal. Skin: No rashes, bruises or suspicious lesions. Lymph: No cervical or inguinal adenopathy. Neurologic: Grossly intact, no focal deficits, moving all 4 extremities. Psychiatric: Normal mood and affect.   Laboratory Data: Lab Results  Component Value Date    WBC 3.9 07/01/2017   HGB 11.2 (L) 07/01/2017   HCT 32.7 (L) 07/01/2017   MCV 83.1 07/01/2017   PLT 157 07/01/2017    Lab Results  Component Value Date   CREATININE 0.75 07/19/2017    Lab Results  Component Value Date   TSH 1.40 02/07/2017       Component Value Date/Time   CHOL 179 02/09/2017 0917   HDL 40 02/09/2017 0917   LDLCALC 107 (H) 02/09/2017 0917    Lab Results  Component Value Date   AST 16 02/07/2017   Lab Results  Component Value Date   ALT 16 02/07/2017    Urinalysis ***  I have reviewed the labs.   Pertinent Imaging ***  Assessment & Plan:    1. History of recurrent UTI's  - reviewed UTI prevention strategies  - RTC for symptoms of an UTI    2. Vaginal atrophy  - I explained to the patient that when women go through menopause and her estrogen levels are severely diminished, the normal vaginal flora will change.  This is due to an increase of the vaginal canal's pH. Because of this, the vaginal canal may be colonized by bacteria from the rectum instead of the protective lactobacillus.  This accompanied by the loss of the mucus barrier with vaginal atrophy is a cause of recurrent urinary tract infections.  - Unfortunately patient has suffered strokes and is not a candidate for vaginal estrogen therapy - she is instructed to take probiotics or eat foods that have probiotics                                       No Follow-up on file.  These notes generated with voice recognition software. I apologize for typographical errors.  Zara Council, [facility] [address] [place], [place] [address] [phone]

## 2017-10-03 ENCOUNTER — Encounter: Payer: Self-pay | Admitting: Family Medicine

## 2017-10-03 ENCOUNTER — Ambulatory Visit (INDEPENDENT_AMBULATORY_CARE_PROVIDER_SITE_OTHER): Payer: Medicare Other | Admitting: Family Medicine

## 2017-10-03 ENCOUNTER — Ambulatory Visit (INDEPENDENT_AMBULATORY_CARE_PROVIDER_SITE_OTHER): Payer: Medicare Other

## 2017-10-03 ENCOUNTER — Telehealth: Payer: Self-pay | Admitting: Family Medicine

## 2017-10-03 VITALS — BP 129/77 | HR 63 | Temp 98.2°F | Wt 176.0 lb

## 2017-10-03 VITALS — BP 129/77 | HR 63 | Temp 98.2°F | Resp 16 | Ht 62.0 in | Wt 176.0 lb

## 2017-10-03 DIAGNOSIS — E782 Mixed hyperlipidemia: Secondary | ICD-10-CM | POA: Diagnosis not present

## 2017-10-03 DIAGNOSIS — D500 Iron deficiency anemia secondary to blood loss (chronic): Secondary | ICD-10-CM

## 2017-10-03 DIAGNOSIS — D1803 Hemangioma of intra-abdominal structures: Secondary | ICD-10-CM | POA: Diagnosis not present

## 2017-10-03 DIAGNOSIS — N390 Urinary tract infection, site not specified: Secondary | ICD-10-CM | POA: Diagnosis not present

## 2017-10-03 DIAGNOSIS — Z0000 Encounter for general adult medical examination without abnormal findings: Secondary | ICD-10-CM | POA: Diagnosis not present

## 2017-10-03 DIAGNOSIS — I482 Chronic atrial fibrillation, unspecified: Secondary | ICD-10-CM

## 2017-10-03 DIAGNOSIS — I129 Hypertensive chronic kidney disease with stage 1 through stage 4 chronic kidney disease, or unspecified chronic kidney disease: Secondary | ICD-10-CM | POA: Diagnosis not present

## 2017-10-03 MED ORDER — LISINOPRIL-HYDROCHLOROTHIAZIDE 20-25 MG PO TABS: 1.0000 | ORAL_TABLET | Freq: Two times a day (BID) | ORAL | 1 refills | Status: DC

## 2017-10-03 MED ORDER — AMLODIPINE BESYLATE 5 MG PO TABS: 5.0000 mg | ORAL_TABLET | Freq: Every day | ORAL | 1 refills | Status: DC

## 2017-10-03 MED ORDER — FERROUS SULFATE 325 (65 FE) MG PO TBEC: 325.0000 mg | DELAYED_RELEASE_TABLET | Freq: Three times a day (TID) | ORAL | 1 refills | Status: DC

## 2017-10-03 NOTE — Assessment & Plan Note (Signed)
Rechecking levels today. Await results. Continue to monitor. Call with any concerns.

## 2017-10-03 NOTE — Assessment & Plan Note (Signed)
Under good control. Rechecking levels today. Continue to monitor.

## 2017-10-03 NOTE — Patient Instructions (Signed)
Kristina Henson , Thank you for taking time to come for your Medicare Wellness Visit. I appreciate your ongoing commitment to your health goals. Please review the following plan we discussed and let me know if I can assist you in the future.   Screening recommendations/referrals: Colonoscopy: completed 11/18/2015 Mammogram: 08/22/2017 Bone Density: due at age 60 Recommended yearly ophthalmology/optometry visit for glaucoma screening and checkup Recommended yearly dental visit for hygiene and checkup  Vaccinations: Influenza vaccine: up to date   Pneumococcal vaccine: prevnar 13 due at age 69 Tdap vaccine: up to date  Shingles vaccine: due, check with your insurance company for coverage    Advanced directives: Advance directive discussed with you today. I have provided a copy for you to complete at home and have notarized. Once this is complete please bring a copy in to our office so we can scan it into your chart.  Conditions/risks identified: Recommend drinking at least 6-8 glasses of water a day   Next appointment: Follow up in one year for your annual wellness exam.   Preventive Care 40-64 Years, Female Preventive care refers to lifestyle choices and visits with your health care provider that can promote health and wellness. What does preventive care include?  A yearly physical exam. This is also called an annual well check.  Dental exams once or twice a year.  Routine eye exams. Ask your health care provider how often you should have your eyes checked.  Personal lifestyle choices, including:  Daily care of your teeth and gums.  Regular physical activity.  Eating a healthy diet.  Avoiding tobacco and drug use.  Limiting alcohol use.  Practicing safe sex.  Taking low-dose aspirin daily starting at age 109.  Taking vitamin and mineral supplements as recommended by your health care provider. What happens during an annual well check? The services and screenings done by your  health care provider during your annual well check will depend on your age, overall health, lifestyle risk factors, and family history of disease. Counseling  Your health care provider may ask you questions about your:  Alcohol use.  Tobacco use.  Drug use.  Emotional well-being.  Home and relationship well-being.  Sexual activity.  Eating habits.  Work and work Statistician.  Method of birth control.  Menstrual cycle.  Pregnancy history. Screening  You may have the following tests or measurements:  Height, weight, and BMI.  Blood pressure.  Lipid and cholesterol levels. These may be checked every 5 years, or more frequently if you are over 81 years old.  Skin check.  Lung cancer screening. You may have this screening every year starting at age 25 if you have a 30-pack-year history of smoking and currently smoke or have quit within the past 15 years.  Fecal occult blood test (FOBT) of the stool. You may have this test every year starting at age 90.  Flexible sigmoidoscopy or colonoscopy. You may have a sigmoidoscopy every 5 years or a colonoscopy every 10 years starting at age 29.  Hepatitis C blood test.  Hepatitis B blood test.  Sexually transmitted disease (STD) testing.  Diabetes screening. This is done by checking your blood sugar (glucose) after you have not eaten for a while (fasting). You may have this done every 1-3 years.  Mammogram. This may be done every 1-2 years. Talk to your health care provider about when you should start having regular mammograms. This may depend on whether you have a family history of breast cancer.  BRCA-related cancer  screening. This may be done if you have a family history of breast, ovarian, tubal, or peritoneal cancers.  Pelvic exam and Pap test. This may be done every 3 years starting at age 22. Starting at age 21, this may be done every 5 years if you have a Pap test in combination with an HPV test.  Bone density scan.  This is done to screen for osteoporosis. You may have this scan if you are at high risk for osteoporosis. Discuss your test results, treatment options, and if necessary, the need for more tests with your health care provider. Vaccines  Your health care provider may recommend certain vaccines, such as:  Influenza vaccine. This is recommended every year.  Tetanus, diphtheria, and acellular pertussis (Tdap, Td) vaccine. You may need a Td booster every 10 years.  Zoster vaccine. You may need this after age 4.  Pneumococcal 13-valent conjugate (PCV13) vaccine. You may need this if you have certain conditions and were not previously vaccinated.  Pneumococcal polysaccharide (PPSV23) vaccine. You may need one or two doses if you smoke cigarettes or if you have certain conditions. Talk to your health care provider about which screenings and vaccines you need and how often you need them. This information is not intended to replace advice given to you by your health care provider. Make sure you discuss any questions you have with your health care provider. Document Released: 10/10/2015 Document Revised: 06/02/2016 Document Reviewed: 07/15/2015 Elsevier Interactive Patient Education  2017 [place] Prevention in the Home Falls can cause injuries. They can happen to people of all ages. There are many things you can do to make your home safe and to help prevent falls. What can I do on the outside of my home?  Regularly fix the edges of walkways and driveways and fix any cracks.  Remove anything that might make you trip as you walk through a door, such as a raised step or threshold.  Trim any bushes or trees on the path to your home.  Use bright outdoor lighting.  Clear any walking paths of anything that might make someone trip, such as rocks or tools.  Regularly check to see if handrails are loose or broken. Make sure that both sides of any steps have handrails.  Any raised decks  and porches should have guardrails on the edges.  Have any leaves, snow, or ice cleared regularly.  Use sand or salt on walking paths during winter.  Clean up any spills in your garage right away. This includes oil or grease spills. What can I do in the bathroom?  Use night lights.  Install grab bars by the toilet and in the tub and shower. Do not use towel bars as grab bars.  Use non-skid mats or decals in the tub or shower.  If you need to sit down in the shower, use a plastic, non-slip stool.  Keep the floor dry. Clean up any water that spills on the floor as soon as it happens.  Remove soap buildup in the tub or shower regularly.  Attach bath mats securely with double-sided non-slip rug tape.  Do not have throw rugs and other things on the floor that can make you trip. What can I do in the bedroom?  Use night lights.  Make sure that you have a light by your bed that is easy to reach.  Do not use any sheets or blankets that are too big for your bed. They should not  hang down onto the floor.  Have a firm chair that has side arms. You can use this for support while you get dressed.  Do not have throw rugs and other things on the floor that can make you trip. What can I do in the kitchen?  Clean up any spills right away.  Avoid walking on wet floors.  Keep items that you use a lot in easy-to-reach places.  If you need to reach something above you, use a strong step stool that has a grab bar.  Keep electrical cords out of the way.  Do not use floor polish or wax that makes floors slippery. If you must use wax, use non-skid floor wax.  Do not have throw rugs and other things on the floor that can make you trip. What can I do with my stairs?  Do not leave any items on the stairs.  Make sure that there are handrails on both sides of the stairs and use them. Fix handrails that are broken or loose. Make sure that handrails are as long as the stairways.  Check any  carpeting to make sure that it is firmly attached to the stairs. Fix any carpet that is loose or worn.  Avoid having throw rugs at the top or bottom of the stairs. If you do have throw rugs, attach them to the floor with carpet tape.  Make sure that you have a light switch at the top of the stairs and the bottom of the stairs. If you do not have them, ask someone to add them for you. What else can I do to help prevent falls?  Wear shoes that:  Do not have high heels.  Have rubber bottoms.  Are comfortable and fit you well.  Are closed at the toe. Do not wear sandals.  If you use a stepladder:  Make sure that it is fully opened. Do not climb a closed stepladder.  Make sure that both sides of the stepladder are locked into place.  Ask someone to hold it for you, if possible.  Clearly mark and make sure that you can see:  Any grab bars or handrails.  First and last steps.  Where the edge of each step is.  Use tools that help you move around (mobility aids) if they are needed. These include:  Canes.  Walkers.  Scooters.  Crutches.  Turn on the lights when you go into a dark area. Replace any light bulbs as soon as they burn out.  Set up your furniture so you have a clear path. Avoid moving your furniture around.  If any of your floors are uneven, fix them.  If there are any pets around you, be aware of where they are.  Review your medicines with your doctor. Some medicines can make you feel dizzy. This can increase your chance of falling. Ask your doctor what other things that you can do to help prevent falls. This information is not intended to replace advice given to you by your health care provider. Make sure you discuss any questions you have with your health care provider. Document Released: 07/10/2009 Document Revised: 02/19/2016 Document Reviewed: 10/18/2014 Elsevier Interactive Patient Education  2017 [employer].

## 2017-10-03 NOTE — Telephone Encounter (Signed)
erroneous

## 2017-10-03 NOTE — Assessment & Plan Note (Signed)
Under good control. Continue current regimen, continue to monitor. Call with any concerns.

## 2017-10-03 NOTE — Progress Notes (Signed)
BP 129/77 (BP Location: Left Arm, Patient Position: Sitting, Cuff Size: Normal)   Pulse 63   Temp 98.2 F (36.8 C)   Wt 176 lb (79.8 kg)   SpO2 97%   BMI 32.19 kg/m    Subjective:    Patient ID: Kristina Henson, female    DOB: [birth year], 62 y.o.   MRN: [id number]  HPI: Kristina Henson is a 62 y.o. female  Chief Complaint  Patient presents with  . Hypertension  . Hyperlipidemia  . Anemia   HYPERTENSION / [place] Satisfied with current treatment? yes Duration of hypertension: chronic BP monitoring frequency: not checking BP medication side effects: no Past BP meds: amlodipine, hydralazine, lisinopril hctz Duration of hyperlipidemia: chronic Cholesterol medication side effects: Not on anything Cholesterol supplements: none Past cholesterol medications: none Medication compliance: excellent compliance Aspirin: no Recent stressors: no Recurrent headaches: no Visual changes: no Palpitations: no Dyspnea: no Chest pain: no Lower extremity edema: no Dizzy/lightheaded: no  ANEMIA Anemia status: controlled Etiology of anemia: iron deficiency Duration of anemia treatment: chronic Compliance with treatment: excellent compliance Iron supplementation side effects: no Severity of anemia: moderate Fatigue: yes Decreased exercise tolerance: no  Dyspnea on exertion: no Palpitations: no Bleeding: no Pica: no  Relevant past medical, surgical, family and social history reviewed and updated as indicated. Interim medical history since our last visit reviewed. Allergies and medications reviewed and updated.  Review of Systems  Constitutional: Negative.   Respiratory: Negative.   Cardiovascular: Negative.   Neurological: Negative.   Psychiatric/Behavioral: Negative.     Per HPI unless specifically indicated above     Objective:    BP 129/77 (BP Location: Left Arm, Patient Position: Sitting, Cuff Size: Normal)   Pulse 63   Temp 98.2 F (36.8 C)   Wt 176 lb  (79.8 kg)   SpO2 97%   BMI 32.19 kg/m   Wt Readings from Last 3 Encounters:  10/03/17 176 lb (79.8 kg)  10/03/17 176 lb (79.8 kg)  09/08/17 170 lb (77.1 kg)    Physical Exam  Constitutional: She is oriented to person, place, and time. She appears well-developed and well-nourished. No distress.  HENT:  Head: Normocephalic and atraumatic.  Right Ear: Hearing normal.  Left Ear: Hearing normal.  Nose: Nose normal.  Eyes: Conjunctivae and lids are normal. Right eye exhibits no discharge. Left eye exhibits no discharge. No scleral icterus.  Cardiovascular: Normal rate, regular rhythm, normal heart sounds and intact distal pulses. Exam reveals no gallop and no friction rub.  No murmur heard. Pulmonary/Chest: Effort normal and breath sounds normal. No respiratory distress. She has no wheezes. She has no rales. She exhibits no tenderness.  Musculoskeletal: Normal range of motion.  Neurological: She is alert and oriented to person, place, and time.  Skin: Skin is warm, dry and intact. No rash noted. She is not diaphoretic. No erythema. No pallor.  Psychiatric: She has a normal mood and affect. Her speech is normal and behavior is normal. Judgment and thought content normal. Cognition and memory are normal.  Nursing note and vitals reviewed.   Results for orders placed or performed during the hospital encounter of 09/08/17  Surgical pathology  Result Value Ref Range   SURGICAL PATHOLOGY      Surgical Pathology CASE: [id number] PATIENT: Kristina Henson Surgical Pathology Report     SPECIMEN SUBMITTED: A. Submandibular gland, left  CLINICAL HISTORY: None provided  PRE-OPERATIVE DIAGNOSIS: None provided  POST-OPERATIVE DIAGNOSIS: Neck mass     DIAGNOSIS: A.  SUBMANDIBULAR GLAND, LEFT; EXCISION: - PLEOMORPHIC ADENOMA, 1.6 CM. - MARGINS ARE NEGATIVE. - NEGATIVE FOR MALIGNANCY.   GROSS DESCRIPTION:  A. Labeled: left submandibular gland  Tissue fragment(s): 1  Size:  13 g, 5.4 x 2.5 x 1.7 cm  Description: The specimen is an irregularly-shaped multilobulated tan focally fragment of unoriented tissue. The specimen is inked blue. The specimen is sectioned to reveal a circumscribed 1.6 x 1.5 x 1.2 cm slightly firm gray nodule abutting the margin but appears encapsulated. The remaining parenchyma is lobulated yellow to orange.  Block summary 1-4-entire mass from one edge to the opposite edge respectively 5-representative uninvolved parenchy ma away from nodule    Final Diagnosis performed by Quay Burow, MD.  Electronically signed 09/09/2017 9:33:36AM    The electronic signature indicates that the named Attending Pathologist has evaluated the specimen  Technical component performed at [facility], [address], [place], [place] [address] Lab: [phone] Dir: Rush Farmer, MD, MMM  Professional component performed at [facility], [facility], [place], [place], [place] [address] Lab: [phone] Dir: Dellia Nims. Reuel Derby, MD        Assessment & Plan:   Problem List Items Addressed This Visit      Cardiovascular and Mediastinum   Atrial fibrillation ([place])    Under good control. Continue to follow with cardiology. Continue to monitor. Call with any concerns.       Relevant Medications   lisinopril-hydrochlorothiazide (PRINZIDE,ZESTORETIC) 20-25 MG tablet   amLODipine (NORVASC) 5 MG tablet   Other Relevant Orders   CBC with Differential/Platelet   Comprehensive metabolic panel   TSH   UA/M w/rflx Culture, Routine   Liver hemangioma    Stable. Rechecking labs today. Await results.       Relevant Medications   lisinopril-hydrochlorothiazide (PRINZIDE,ZESTORETIC) 20-25 MG tablet   amLODipine (NORVASC) 5 MG tablet   Other Relevant Orders   CBC with Differential/Platelet   Comprehensive metabolic panel   TSH   UA/M w/rflx Culture, Routine     Genitourinary   Benign hypertensive renal disease - Primary    Under  good control. Continue current regimen, continue to monitor. Call with any concerns.       Relevant Orders   CBC with Differential/Platelet   Comprehensive metabolic panel   Microalbumin, Urine Waived   TSH   UA/M w/rflx Culture, Routine     Other   HLD (hyperlipidemia)    Under good control. Rechecking levels today. Continue to monitor.       Relevant Medications   lisinopril-hydrochlorothiazide (PRINZIDE,ZESTORETIC) 20-25 MG tablet   amLODipine (NORVASC) 5 MG tablet   Other Relevant Orders   CBC with Differential/Platelet   Comprehensive metabolic panel   Lipid Panel w/o Chol/HDL Ratio   TSH   UA/M w/rflx Culture, Routine   Iron deficiency anemia due to chronic blood loss    Rechecking levels today. Await results. Continue to monitor. Call with any concerns.       Relevant Medications   ferrous sulfate 325 (65 FE) MG EC tablet   Other Relevant Orders   CBC with Differential/Platelet   Comprehensive metabolic panel   TSH   UA/M w/rflx Culture, Routine   Iron and TIBC   Ferritin       Follow up plan: Return in about 6 months (around 04/02/2018) for Follow up BP/cholesterol/anemia.

## 2017-10-03 NOTE — Progress Notes (Signed)
Subjective:   Kristina Henson is a 62 y.o. female who presents for Medicare Annual (Subsequent) preventive examination.  Review of Systems:   Cardiac Risk Factors include: hypertension;smoking/ tobacco exposure     Objective:     Vitals: BP 129/77 (BP Location: Left Arm, Patient Position: Sitting)   Pulse 63   Temp 98.2 F (36.8 C) (Oral)   Resp 16   Ht 5\' 2"  (1.575 m)   Wt 176 lb (79.8 kg)   BMI 32.19 kg/m   Body mass index is 32.19 kg/m.  Advanced Directives 10/03/2017 09/08/2017 08/30/2017 07/01/2017 04/27/2017 12/13/2016 08/11/2016  Does Patient Have a Medical Advance Directive? No No No No No No No  Does patient want to make changes to medical advance directive? Yes (MAU/Ambulatory/Procedural Areas - Information given) - - No - Patient declined - - -  Would patient like information on creating a medical advance directive? - Yes (Inpatient - patient requests chaplain consult to create a medical advance directive) No - Patient declined - No - Patient declined No - Patient declined Yes - Educational materials given    Tobacco Social History   Tobacco Use  Smoking Status Former Smoker  . Last attempt to quit: 1978  . Years since quitting: 41.0  Smokeless Tobacco Never Used     Counseling given: Not Answered   Clinical Intake:  Pre-visit preparation completed: Yes  Pain : No/denies pain     Nutritional Risks: None Diabetes: No  How often do you need to have someone help you when you read instructions, pamphlets, or other written materials from your doctor or pharmacy?: 1 - Never What is the last grade level you completed in school?: 12th grade  Interpreter Needed?: No  Information entered by :: Doyle Tegethoff,LPN   Past Medical History:  Diagnosis Date  . Anemia   . Arthritis    BACK-LUMBAR  . Atrial fibrillation ([place])   . Coronary artery disease   . Dysrhythmia   . Hypertension   . Mass in neck    lt side  . Pneumonia 2016  . Stroke ([place]) 2012  .  Stroke [facility]) 2012   Past Surgical History:  Procedure Laterality Date  . ABDOMINAL HYSTERECTOMY     Total  . cardiac catherization    . COLONOSCOPY WITH PROPOFOL N/A 11/18/2015   Procedure: COLONOSCOPY WITH PROPOFOL;  Surgeon: Lucilla Lame, MD;  Location: [facility];  Service: Endoscopy;  Laterality: N/A;  . CORONARY ANGIOPLASTY    . ESOPHAGOGASTRODUODENOSCOPY (EGD) WITH PROPOFOL N/A 04/23/2016   Procedure: ESOPHAGOGASTRODUODENOSCOPY (EGD) WITH PROPOFOL;  Surgeon: Lucilla Lame, MD;  Location: [place];  Service: Endoscopy;  Laterality: N/A;  small bowel bx gastric antrum bx  . SUBMANDIBULAR GLAND EXCISION Left 09/08/2017   Procedure: EXCISION SUBMANDIBULAR GLAND;  Surgeon: Carloyn Manner, MD;  Location: [facility] OR[facility];  Service: ENT;  Laterality: Left;  . throat biopsy     Family History  Problem Relation Age of Onset  . Arthritis Mother   . Cancer Mother   . Hypertension Sister   . Asthma Brother   . Dementia Brother   . Breast cancer Neg Hx   . Kidney cancer Neg Hx   . Bladder Cancer Neg Hx    Social History   Socioeconomic History  . Marital status: Divorced    Spouse name: None  . Number of children: None  . Years of education: 48  . Highest education level: 12th grade  Social Needs  . Financial resource strain:  Not hard at all  . Food insecurity - worry: Never true  . Food insecurity - inability: Never true  . Transportation needs - medical: No  . Transportation needs - non-medical: No  Occupational History  . None  Tobacco Use  . Smoking status: Former Smoker    Last attempt to quit: 1978    Years since quitting: 41.0  . Smokeless tobacco: Never Used  Substance and Sexual Activity  . Alcohol use: Yes    Alcohol/week: 0.6 oz    Types: 1 Glasses of wine per week  . Drug use: No  . Sexual activity: Yes  Other Topics Concern  . None  Social History Narrative  . None    Outpatient Encounter Medications as of 10/03/2017  Medication Sig  .  amiodarone (PACERONE) 200 MG tablet Take 200 mg by mouth every morning.   Marland Kitchen amLODipine (NORVASC) 5 MG tablet Take 1 tablet (5 mg total) by mouth daily.  . Ascorbic Acid (VITAMIN C) 100 MG tablet Take 100 mg by mouth daily.  Marland Kitchen aspirin EC 81 MG tablet Take 81 mg by mouth daily.   Marland Kitchen CRANBERRY PO Take 1 tablet by mouth daily.  . ferrous sulfate 325 (65 FE) MG EC tablet Take 1 tablet (325 mg total) by mouth 3 (three) times daily with meals.  . hydrALAZINE (APRESOLINE) 50 MG tablet Take 50 mg by mouth 2 (two) times daily.  Marland Kitchen lisinopril-hydrochlorothiazide (PRINZIDE,ZESTORETIC) 20-25 MG tablet Take 2 tablets by mouth daily. (Patient taking differently: Take 1 tablet by mouth 2 (two) times daily. )  . Multiple Vitamins-Minerals (MULTIVITAMIN WITH MINERALS) tablet Take 1 tablet by mouth daily.  . ondansetron (ZOFRAN) 4 MG tablet Take 1 tablet (4 mg total) by mouth every 8 (eight) hours as needed for up to 10 doses for nausea or vomiting.  Marland Kitchen HYDROcodone-acetaminophen (NORCO) 5-325 MG tablet Take 1 tablet by mouth every 4 (four) hours as needed for moderate pain. (Patient not taking: Reported on 10/03/2017)   No facility-administered encounter medications on file as of 10/03/2017.     Activities of Daily Living In your present state of health, do you have any difficulty performing the following activities: 10/03/2017 09/08/2017  Hearing? N -  Vision? N -  Difficulty concentrating or making decisions? N -  Walking or climbing stairs? N -  Dressing or bathing? N -  Doing errands, shopping? N Y  Conservation officer, nature and eating ? N -  Using the Toilet? N -  In the past six months, have you accidently leaked urine? N -  Do you have problems with loss of bowel control? N -  Managing your Medications? N -  Managing your Finances? N -  Housekeeping or managing your Housekeeping? N -  Some recent data might be hidden    Patient Care Team: Valerie Roys, DO as PCP - General (Family Medicine) Ubaldo Glassing Javier Docker, MD  as Consulting Physician (Cardiology) Carloyn Manner, MD as Referring Physician (Otolaryngology)    Assessment:   This is a routine wellness examination for Kristina Henson.  Exercise Activities and Dietary recommendations Current Exercise Habits: Home exercise routine, Type of exercise: walking, Time (Minutes): 45, Frequency (Times/Week): 5, Weekly Exercise (Minutes/Week): 225, Intensity: Mild, Exercise limited by: None identified  Goals    . DIET - INCREASE WATER INTAKE     Recommend drinking at least 6-8 glasses of water a day        Fall Risk Fall Risk  10/03/2017 07/19/2017 05/24/2017 05/17/2017 02/09/2017  Falls  in the past year? No No No No No   Is the patient's home free of loose throw rugs in walkways, pet beds, electrical cords, etc?   yes      Grab bars in the bathroom? yes      Handrails on the stairs?   yes      Adequate lighting?   yes  Timed Get Up and Go performed: completed in 7 seconds with no use of assistive devices, steady gait. No intervention needed at this time  Depression Screen PHQ 2/9 Scores 10/03/2017 07/19/2017 08/11/2016  PHQ - 2 Score 0 0 0     Cognitive Function     6CIT Screen 10/03/2017 08/11/2016  What Year? 0 points 0 points  What month? 0 points 0 points  What time? 0 points 0 points  Count back from 20 0 points 2 points  Months in reverse 0 points 0 points  Repeat phrase 0 points 2 points  Total Score 0 4    Immunization History  Administered Date(s) Administered  . Influenza,inj,Quad PF,6+ Mos 06/25/2015, 06/16/2017  . Influenza-Unspecified 06/29/2016  . Pneumococcal Polysaccharide-23 07/29/2015  . Td 01/27/2015    Qualifies for Shingles Vaccine? Discussed shingrix vaccine  Screening Tests Health Maintenance  Topic Date Due  . MAMMOGRAM  08/23/2019  . COLONOSCOPY  11/17/2020  . TETANUS/TDAP  01/26/2025  . INFLUENZA VACCINE  Completed  . Hepatitis C Screening  Completed  . HIV Screening  Completed    Cancer Screenings: Lung: Low  Dose CT Chest recommended if Age 30-80 years, 30 pack-year currently smoking OR have quit w/in 15years. Patient does not qualify. Breast:  Up to date on Mammogram? Yes  08/22/2017 Up to date of Bone Density/Dexa? Due at age 6 Colorectal: completed 11/18/2015  Additional Screenings: Hepatitis B/HIV/Syphillis: done 04/11/2017 Hepatitis C Screening: done 04/11/2017     Plan:    I have personally reviewed and addressed the Medicare Annual Wellness questionnaire and have noted the following in the patient's chart:  A. Medical and social history B. Use of alcohol, tobacco or illicit drugs  C. Current medications and supplements D. Functional ability and status E.  Nutritional status F.  Physical activity G. Advance directives H. List of other physicians I.  Hospitalizations, surgeries, and ER visits in previous 12 months J.  [place] such as hearing and vision if needed, cognitive and depression L. Referrals and appointments   In addition, I have reviewed and discussed with patient certain preventive protocols, quality metrics, and best practice recommendations. A written personalized care plan for preventive services as well as general preventive health recommendations were provided to patient.   Signed,  Tyler Aas, LPN Nurse Health Advisor   Nurse Notes: none

## 2017-10-03 NOTE — Assessment & Plan Note (Signed)
Stable. Rechecking labs today. Await results.  

## 2017-10-03 NOTE — Assessment & Plan Note (Signed)
Under good control. Continue to follow with cardiology. Continue to monitor. Call with any concerns.

## 2017-10-04 LAB — COMPREHENSIVE METABOLIC PANEL
ALBUMIN: 3.9 g/dL (ref 3.5–5.5)
ALT: 16 IU/L (ref 0–32)
AST: 15 IU/L (ref 0–40)
Albumin/Globulin Ratio: 1.3 (ref 1.2–2.2)
Alkaline Phosphatase: 86 IU/L (ref 39–117)
BUN / CREAT RATIO: 35 — AB (ref 9–23)
BUN: 24 mg/dL (ref 6–24)
Bilirubin Total: 0.3 mg/dL (ref 0.0–1.2)
CALCIUM: 8.8 mg/dL (ref 8.7–10.2)
CO2: 22 mmol/L (ref 20–29)
CREATININE: 0.69 mg/dL (ref 0.57–1.00)
Chloride: 105 mmol/L (ref 96–106)
GFR calc Af Amer: 110 mL/min/{1.73_m2} (ref >59)
GFR, EST NON AFRICAN AMERICAN: 96 mL/min/{1.73_m2} (ref >59)
Globulin, Total: 2.9 g/dL (ref 1.5–4.5)
Glucose: 101 mg/dL — ABNORMAL HIGH (ref 65–99)
Potassium: 3.8 mmol/L (ref 3.5–5.2)
SODIUM: 142 mmol/L (ref 134–144)
Total Protein: 6.8 g/dL (ref 6.0–8.5)

## 2017-10-04 LAB — CBC WITH DIFFERENTIAL/PLATELET
BASOS ABS: 0.1 10*3/uL (ref 0.0–0.2)
Basos: 2 %
EOS (ABSOLUTE): 0.1 10*3/uL (ref 0.0–0.4)
EOS: 2 %
HEMOGLOBIN: 9.3 g/dL — AB (ref 11.1–15.9)
Hematocrit: 27.7 % — ABNORMAL LOW (ref 34.0–46.6)
Lymphocytes Absolute: 0.8 10*3/uL (ref 0.7–3.1)
Lymphs: 32 %
MCH: 28.2 pg (ref 26.6–33.0)
MCHC: 33.6 g/dL (ref 31.5–35.7)
MCV: 84 fL (ref 79–97)
MONOCYTES: 4 %
Monocytes Absolute: 0.1 10*3/uL (ref 0.1–0.9)
NEUTROS PCT: 56 %
Neutrophils Absolute: 1.4 10*3/uL (ref 1.4–7.0)
Platelets: 95 10*3/uL — CL (ref 150–379)
RBC: 3.3 x10E6/uL — AB (ref 3.77–5.28)
RDW: 15.6 % — ABNORMAL HIGH (ref 12.3–15.4)
WBC: 2.5 10*3/uL — AB (ref 3.4–10.8)

## 2017-10-04 LAB — LIPID PANEL W/O CHOL/HDL RATIO
Cholesterol, Total: 151 mg/dL (ref 100–199)
HDL: 42 mg/dL (ref >39)
LDL CALC: 82 mg/dL (ref 0–99)
TRIGLYCERIDES: 134 mg/dL (ref 0–149)
VLDL CHOLESTEROL CAL: 27 mg/dL (ref 5–40)

## 2017-10-04 LAB — IRON AND TIBC
IRON SATURATION: 13 % — AB (ref 15–55)
IRON: 31 ug/dL (ref 27–159)
Total Iron Binding Capacity: 234 ug/dL — ABNORMAL LOW (ref 250–450)
UIBC: 203 ug/dL (ref 131–425)

## 2017-10-04 LAB — IMMATURE CELLS
METAMYELOCYTES: 3 % — AB (ref 0–0)
MYELOCYTES: 1 % — AB (ref 0–0)

## 2017-10-04 LAB — FERRITIN: FERRITIN: 188 ng/mL — AB (ref 15–150)

## 2017-10-04 LAB — TSH: TSH: 1.13 u[IU]/mL (ref 0.450–4.500)

## 2017-10-04 NOTE — Progress Notes (Signed)
10/05/2017 1:56 PM   Kristina Henson 08-Feb-1956 [id number]  Referring provider: Valerie Roys, DO [place], [place] [address]  Chief Complaint  Patient presents with  . Recurrent UTI    3 month    HPI: 62 yo WF with a history of recurrent UTI's and vaginal atrophy who presents today for a three month follow up.   Background history Patient is a 62 -year-old Caucasian female who is referred to [place] by, Dr. Valerie Roys, for recurrent urinary tract infections.  Patient states that she has had several urinary tract infections over the last year.  [place]   Reviewing her records,  she has had 2 documented positive urinary tract infections for Escherichia coli.   Her symptoms with a urinary tract infection consist of urgency, frequency foul odor and a headache.  Today, she states she is feeling better than she was prior to starting the antibiotics.   Currently, she denies dysuria, gross hematuria, suprapubic pain, back pain, abdominal pain or flank pain.  She has not had any recent fevers, chills, nausea or vomiting.   She is experiencing urinary frequency.  Her CATH UA today is negative.  She does not have a history of nephrolithiasis, GU surgery or GU trauma.   No family history of kidney stones.  She is not sexually active.   She is postmenopausal.   She denies constipation and/or diarrhea.    She does not engage in good perineal hygiene. She does not take tub baths.  She does not have incontinence.  She is not having pain with bladder filling.  She had a contrast CT in 04/2016 which noted Adrenals/Urinary Tract: No masses identified. No evidence of ureteral calculi or hydronephrosis. Unopacified urinary bladder is unremarkable in appearance.  MRI in 11/2016 noted Adrenals/Urinary Tract: Normal adrenal glands. Bilateral too small to characterize renal lesions. Left extrarenal pelvis. No hydronephrosis.  She is drinking 4 to 5 glasses of water daily.   Rare occasions  Ginger Ale.  One or two cups of coffee during the winter.  Takes cranberry tablets x 2 years.   No daily probiotics.   Reviewed referral notes - 06/02/2017 + urine culture for E.coli given septra ds;  06/16/2017 + urine culture for E.coli given Cipro  Today, she she denies any frequency, urgency, dysuria, nocturia, incontinence, gross hematuria he has not had any recent fevers, chills, nausea or vomiting.  She states that she has not had any urinary tract she started taking the vitamin C eating yogurt daily.  Her PVR is 37 mL.    She states she has not had an UTI since her last visit with [place] three months ago, but there is a urine culture in preliminary status through Dr. Durenda Age office.    PMH: Past Medical History:  Diagnosis Date  . Anemia   . Arthritis    BACK-LUMBAR  . Atrial fibrillation ([place])   . Coronary artery disease   . Dysrhythmia   . Hypertension   . Mass in neck    lt side  . Pneumonia 2016  . Stroke ([place]) 2012  . Stroke [facility]) 2012    Surgical History: Past Surgical History:  Procedure Laterality Date  . ABDOMINAL HYSTERECTOMY     Total  . cardiac catherization    . COLONOSCOPY WITH PROPOFOL N/A 11/18/2015   Procedure: COLONOSCOPY WITH PROPOFOL;  Surgeon: Lucilla Lame, MD;  Location: [facility];  Service: Endoscopy;  Laterality: N/A;  .  CORONARY ANGIOPLASTY    . ESOPHAGOGASTRODUODENOSCOPY (EGD) WITH PROPOFOL N/A 04/23/2016   Procedure: ESOPHAGOGASTRODUODENOSCOPY (EGD) WITH PROPOFOL;  Surgeon: Lucilla Lame, MD;  Location: [place];  Service: Endoscopy;  Laterality: N/A;  small bowel bx gastric antrum bx  . SUBMANDIBULAR GLAND EXCISION Left 09/08/2017   Procedure: EXCISION SUBMANDIBULAR GLAND;  Surgeon: Carloyn Manner, MD;  Location: [facility];  Service: ENT;  Laterality: Left;  . throat biopsy      Home Medications:  Allergies as of 10/05/2017      Reactions   Amoxicillin Other (See Comments)   Joint pains      Medication List        Accurate  as of 10/05/17  1:56 PM. Always use your most recent med list.          amiodarone 200 MG tablet Commonly known as:  PACERONE Take 200 mg by mouth every morning.   amLODipine 5 MG tablet Commonly known as:  NORVASC Take 1 tablet (5 mg total) by mouth daily.   aspirin EC 81 MG tablet Take 81 mg by mouth daily.   CRANBERRY PO Take 1 tablet by mouth daily.   ferrous sulfate 325 (65 FE) MG EC tablet Take 1 tablet (325 mg total) by mouth 3 (three) times daily with meals.   hydrALAZINE 50 MG tablet Commonly known as:  APRESOLINE Take 50 mg by mouth 2 (two) times daily.   HYDROcodone-acetaminophen 5-325 MG tablet Commonly known as:  NORCO Take 1 tablet by mouth every 4 (four) hours as needed for moderate pain.   lisinopril-hydrochlorothiazide 20-25 MG tablet Commonly known as:  PRINZIDE,ZESTORETIC Take 1 tablet by mouth 2 (two) times daily.   multivitamin with minerals tablet Take 1 tablet by mouth daily.   ondansetron 4 MG tablet Commonly known as:  ZOFRAN Take 1 tablet (4 mg total) by mouth every 8 (eight) hours as needed for up to 10 doses for nausea or vomiting.   vitamin C 100 MG tablet Take 100 mg by mouth daily.       Allergies:  Allergies  Allergen Reactions  . Amoxicillin Other (See Comments)    Joint pains    Family History: Family History  Problem Relation Age of Onset  . Arthritis Mother   . Cancer Mother   . Hypertension Sister   . Asthma Brother   . Dementia Brother   . Breast cancer Neg Hx   . Kidney cancer Neg Hx   . Bladder Cancer Neg Hx     Social History:  reports that she quit smoking about 41 years ago. she has never used smokeless tobacco. She reports that she drinks about 0.6 oz of alcohol per week. She reports that she does not use drugs.  ROS: UROLOGY Frequent Urination?: No Hard to postpone urination?: No Burning/pain with urination?: No Get up at night to urinate?: No Leakage of urine?: No Urine stream starts and stops?:  No Trouble starting stream?: No Do you have to strain to urinate?: No Blood in urine?: No Urinary tract infection?: No Sexually transmitted disease?: No Injury to kidneys or bladder?: No Painful intercourse?: No Weak stream?: No Currently pregnant?: No Vaginal bleeding?: No Last menstrual period?: n  Gastrointestinal Nausea?: No Vomiting?: No Indigestion/heartburn?: No Diarrhea?: No Constipation?: No  Constitutional Fever: No Night sweats?: No Weight loss?: No Fatigue?: No  Skin Skin rash/lesions?: No Itching?: No  Eyes Blurred vision?: No Double vision?: No  Ears/Nose/Throat Sore throat?: No Sinus problems?: No  Hematologic/Lymphatic Swollen glands?:  No Easy bruising?: No  Cardiovascular Leg swelling?: No Chest pain?: No  Respiratory Cough?: No Shortness of breath?: No  Endocrine Excessive thirst?: No  Musculoskeletal Back pain?: No Joint pain?: No  Neurological Headaches?: No Dizziness?: No  Psychologic Depression?: No Anxiety?: No  Physical Exam: BP 119/74   Pulse 65   Ht 5\' 2"  (1.575 m)   Wt 170 lb (77.1 kg)   BMI 31.09 kg/m   Constitutional: Well nourished. Alert and oriented, No acute distress. HEENT: [place] AT, moist mucus membranes. Trachea midline, no masses. Cardiovascular: No clubbing, cyanosis, or edema. Respiratory: Normal respiratory effort, no increased work of breathing. Skin: No rashes, bruises or suspicious lesions. Lymph: No cervical or inguinal adenopathy. Neurologic: Grossly intact, no focal deficits, moving all 4 extremities. Psychiatric: Normal mood and affect.   Laboratory Data: Lab Results  Component Value Date   WBC 2.5 (LL) 10/03/2017   HGB 9.3 (L) 10/03/2017   HCT 27.7 (L) 10/03/2017   MCV 84 10/03/2017   PLT 95 (LL) 10/03/2017    Lab Results  Component Value Date   CREATININE 0.69 10/03/2017    Lab Results  Component Value Date   TSH 1.130 10/03/2017       Component Value Date/Time    CHOL 151 10/03/2017 1618   HDL 42 10/03/2017 1618   LDLCALC 82 10/03/2017 1618    Lab Results  Component Value Date   AST 15 10/03/2017   Lab Results  Component Value Date   ALT 16 10/03/2017    Pertinent Imaging Results for EMBERLEY, KRAL (MRN [id number]) as of 10/05/2017 14:00  Ref. Range 10/05/2017 13:33  Scan Result Unknown 95ml    Assessment & Plan:    1. History of recurrent UTI's  - reviewed UTI prevention strategies  - urine culture is pending through Dr. Durenda Age office   - RTC for symptoms of an UTI    2. Vaginal atrophy  - I explained to the patient that when women go through menopause and her estrogen levels are severely diminished, the normal vaginal flora will change.  This is due to an increase of the vaginal canal's pH. Because of this, the vaginal canal may be colonized by bacteria from the rectum instead of the protective lactobacillus.  This accompanied by the loss of the mucus barrier with vaginal atrophy is a cause of recurrent urinary tract infections.  - Unfortunately patient has suffered strokes and is not a candidate for vaginal estrogen therapy - she is instructed to take probiotics or eat foods that have probiotics                                     Return in about 1 year (around 10/05/2018) for OAB questionnaire, PVR and exam.  These notes generated with voice recognition software. I apologize for typographical errors.  Royden Purl  [facility] [address] [place], [place] [address] [phone]  Addendum:  Urine culture from 10/03/2017 was positive for Kleb and antibiotic was called in.

## 2017-10-05 ENCOUNTER — Encounter: Payer: Self-pay | Admitting: Urology

## 2017-10-05 ENCOUNTER — Telehealth: Payer: Self-pay | Admitting: Family Medicine

## 2017-10-05 ENCOUNTER — Ambulatory Visit (INDEPENDENT_AMBULATORY_CARE_PROVIDER_SITE_OTHER): Payer: Medicare Other | Admitting: Urology

## 2017-10-05 VITALS — BP 119/74 | HR 65 | Ht 62.0 in | Wt 170.0 lb

## 2017-10-05 DIAGNOSIS — N390 Urinary tract infection, site not specified: Secondary | ICD-10-CM

## 2017-10-05 LAB — BLADDER SCAN AMB NON-IMAGING

## 2017-10-05 NOTE — Telephone Encounter (Signed)
Her iron is back down again. I don't see that she has a follow up with the cancer center- can we please get her set up for this?  Otherwise please let her know that her labs came back normal except the iron and the blood count. Thanks!

## 2017-10-07 LAB — UA/M W/RFLX CULTURE, ROUTINE
Bilirubin, UA: NEGATIVE
Glucose, UA: NEGATIVE
Ketones, UA: NEGATIVE
Nitrite, UA: NEGATIVE
PH UA: 5 (ref 5.0–7.5)
PROTEIN UA: NEGATIVE
RBC, UA: NEGATIVE
Specific Gravity, UA: 1.02 (ref 1.005–1.030)
Urobilinogen, Ur: 0.2 mg/dL (ref 0.2–1.0)

## 2017-10-07 LAB — MICROSCOPIC EXAMINATION

## 2017-10-07 LAB — URINE CULTURE, REFLEX

## 2017-10-07 LAB — MICROALBUMIN, URINE WAIVED
Creatinine, Urine Waived: 100 mg/dL (ref 10–300)
MICROALB, UR WAIVED: 30 mg/L — AB (ref 0–19)
Microalb/Creat Ratio: <30 mg/g (ref <30)

## 2017-10-07 MED ORDER — SULFAMETHOXAZOLE-TRIMETHOPRIM 800-160 MG PO TABS: 1.0000 | ORAL_TABLET | Freq: Two times a day (BID) | ORAL | 0 refills | Status: DC

## 2017-10-07 NOTE — Telephone Encounter (Signed)
Please let her know that she has another UTI, I've called in an antibiotic for her.

## 2017-10-07 NOTE — Telephone Encounter (Signed)
Patient notified. Will continue to try to call and schedule a follow up with [facility].

## 2017-10-10 ENCOUNTER — Telehealth: Payer: Self-pay | Admitting: Internal Medicine

## 2017-10-10 DIAGNOSIS — D638 Anemia in other chronic diseases classified elsewhere: Secondary | ICD-10-CM

## 2017-10-10 NOTE — Telephone Encounter (Signed)
Spoke with Beverlee Nims from [facility], she will send a message to the doctor to see if/when they would like to see her, they will contact the patient for the follow up appointment.

## 2017-10-10 NOTE — Telephone Encounter (Signed)
Spoke with Dr. Bradd Canary. Labs reviewed by md. Hold off on iron infusion at this time. Patient need to return to clinic in 1 week with labs and see Dr. Rogue Bussing (cbc only). msg sent to scheduling team to call patient with this new apt.

## 2017-10-10 NOTE — Telephone Encounter (Signed)
Called and left a message at [facility] for them to return my call so that I can get the patient scheduled for a follow up visit.

## 2017-10-17 ENCOUNTER — Ambulatory Visit
Admission: RE | Admit: 2017-10-17 | Discharge: 2017-10-17 | Disposition: A | Discharge status: Home / Self Care | Payer: Medicare Other | Source: Ambulatory Visit | Attending: Internal Medicine | Admitting: Internal Medicine

## 2017-10-17 ENCOUNTER — Other Ambulatory Visit: Payer: Self-pay

## 2017-10-17 ENCOUNTER — Inpatient Hospital Stay: Payer: Medicare Other | Attending: Internal Medicine

## 2017-10-17 ENCOUNTER — Inpatient Hospital Stay (HOSPITAL_BASED_OUTPATIENT_CLINIC_OR_DEPARTMENT_OTHER): Payer: Medicare Other | Admitting: Internal Medicine

## 2017-10-17 VITALS — BP 138/86 | HR 71 | Temp 98.0°F | Resp 22

## 2017-10-17 DIAGNOSIS — R05 Cough: Secondary | ICD-10-CM | POA: Diagnosis not present

## 2017-10-17 DIAGNOSIS — D649 Anemia, unspecified: Secondary | ICD-10-CM | POA: Diagnosis not present

## 2017-10-17 DIAGNOSIS — I517 Cardiomegaly: Secondary | ICD-10-CM | POA: Diagnosis not present

## 2017-10-17 DIAGNOSIS — D696 Thrombocytopenia, unspecified: Secondary | ICD-10-CM

## 2017-10-17 DIAGNOSIS — J8410 Pulmonary fibrosis, unspecified: Secondary | ICD-10-CM | POA: Insufficient documentation

## 2017-10-17 DIAGNOSIS — Z87891 Personal history of nicotine dependence: Secondary | ICD-10-CM

## 2017-10-17 DIAGNOSIS — R059 Cough, unspecified: Secondary | ICD-10-CM

## 2017-10-17 DIAGNOSIS — D638 Anemia in other chronic diseases classified elsewhere: Secondary | ICD-10-CM

## 2017-10-17 DIAGNOSIS — D72819 Decreased white blood cell count, unspecified: Secondary | ICD-10-CM | POA: Diagnosis not present

## 2017-10-17 LAB — CBC WITH DIFFERENTIAL/PLATELET
BASOS PCT: 1 %
Basophils Absolute: 0 10*3/uL (ref 0–0.1)
EOS ABS: 0 10*3/uL (ref 0–0.7)
EOS PCT: 1 %
HEMATOCRIT: 34.3 % — AB (ref 35.0–47.0)
Hemoglobin: 11.3 g/dL — ABNORMAL LOW (ref 12.0–16.0)
Lymphocytes Relative: 26 %
Lymphs Abs: 0.7 10*3/uL — ABNORMAL LOW (ref 1.0–3.6)
MCH: 28.1 pg (ref 26.0–34.0)
MCHC: 33 g/dL (ref 32.0–36.0)
MCV: 85.2 fL (ref 80.0–100.0)
MONO ABS: 0.3 10*3/uL (ref 0.2–0.9)
MONOS PCT: 10 %
Neutro Abs: 1.7 10*3/uL (ref 1.4–6.5)
Neutrophils Relative %: 62 %
PLATELETS: 150 10*3/uL (ref 150–440)
RBC: 4.02 MIL/uL (ref 3.80–5.20)
RDW: 15.8 % — AB (ref 11.5–14.5)
WBC: 2.7 10*3/uL — ABNORMAL LOW (ref 3.6–11.0)

## 2017-10-17 NOTE — Assessment & Plan Note (Addendum)
#   Anemia- likely IDA vs anemia of chronic disease  [unclear etiology]- s/p IV iron; improved. Hb ~11; on iron pills 3 times a day. No IV iron today. Recommend continued oral iron.  # Mild leukopenia white count 2.7/ANC 1.7. [BMBx- NEG] Asymptomatic.   #Intermittent thrombocytopenia-platelet 95 today within normal limits.  # cough- phlegm; yellow; no fevers; question bronchitis.  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  # follow up in 3 months/cbc;crp. Rheum panel.  Cc; Dr.Johnson.

## 2017-10-17 NOTE — Progress Notes (Signed)
Patient here for follow-up for IDA. Patient c/o cough/post nasal drip x 1 week. sats at 98%. Only uses lozengers to coat the back of her throat. Has not tried any other OTC medication for cough.

## 2017-10-17 NOTE — Progress Notes (Signed)
[place] CONSULT NOTE  Patient Care Team: Valerie Roys, DO as PCP - General (Family Medicine) Ubaldo Glassing Javier Docker, MD as Consulting Physician (Cardiology) Carloyn Manner, MD as Referring Physician (Otolaryngology)  CHIEF COMPLAINTS/PURPOSE OF CONSULTATION:   # Anemia of chronic disease- EGD/ [place] [Dr.Wohl; 2017] chronic back pain [? 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     HISTORY OF PRESENTING ILLNESS:  Kristina Henson 62 y.o.  female noted Moderate anemia of Unclear etiology- iron deficiency versus chronic disease is here for follow-up.  In the interim patient was recently evaluated by PCP-noted to have abnormal/worsening blood counts.  Referred to [place] for further evaluation.  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  Patient complains of cough phlegm; sinus pressure over the last few days.  Denies any fevers.  She has not been on antibiotics.  Patient states her back pain joint pains have improved.  Her energy levels are improved. No blood in stools or black stools. She takes iron pills 3 times a day. No constipation.   ROS: A complete 10 point review of system is done which is negative except mentioned above in history of present illness  MEDICAL HISTORY:  Past Medical History:  Diagnosis Date  . Anemia   . Arthritis    BACK-LUMBAR  . Atrial fibrillation ([place])   . Coronary artery disease   . Dysrhythmia   . Hypertension   . Mass in neck    lt side  . Pneumonia 2016  . Stroke ([place]) 2012  . Stroke [facility]) 2012    SURGICAL HISTORY: Past Surgical History:  Procedure Laterality Date  . ABDOMINAL HYSTERECTOMY     Total  . cardiac catherization    . COLONOSCOPY WITH PROPOFOL  N/A 11/18/2015   Procedure: COLONOSCOPY WITH PROPOFOL;  Surgeon: Lucilla Lame, MD;  Location: [facility] ENDOSCOPY;  Service: Endoscopy;  Laterality: N/A;  . CORONARY ANGIOPLASTY    . ESOPHAGOGASTRODUODENOSCOPY (EGD) WITH PROPOFOL N/A 04/23/2016   Procedure: ESOPHAGOGASTRODUODENOSCOPY (EGD) WITH PROPOFOL;  Surgeon: Lucilla Lame, MD;  Location: [place];  Service: Endoscopy;  Laterality: N/A;  small bowel bx gastric antrum bx  . SUBMANDIBULAR GLAND EXCISION Left 09/08/2017   Procedure: EXCISION SUBMANDIBULAR GLAND;  Surgeon: Carloyn Manner, MD;  Location: [facility] ORS;  Service: ENT;  Laterality: Left;  . throat biopsy      SOCIAL HISTORY: Social History   Socioeconomic History  . Marital status: Divorced    Spouse name: Not on file  . Number of children: Not on file  . Years of education: 34  . Highest education level: 12th grade  Social Needs  . Financial resource strain: Not hard at all  . Food insecurity - worry: Never true  . Food insecurity - inability: Never true  . Transportation needs - medical: No  . Transportation needs - non-medical: No  Occupational History  . Not on file  Tobacco Use  . Smoking status: Former Smoker    Last attempt to quit: 1978    Years since quitting: 41.0  . Smokeless tobacco: Never Used  Substance and Sexual Activity  . Alcohol use: Yes    Alcohol/week: 0.6 oz    Types: 1 Glasses of wine per week  . Drug use: No  . Sexual  activity: Yes  Other Topics Concern  . Not on file  Social History Narrative  . Not on file    FAMILY HISTORY: Family History  Problem Relation Age of Onset  . Arthritis Mother   . Cancer Mother   . Hypertension Sister   . Asthma Brother   . Dementia Brother   . Breast cancer Neg Hx   . Kidney cancer Neg Hx   . Bladder Cancer Neg Hx     ALLERGIES:  is allergic to amoxicillin.  MEDICATIONS:  Current Outpatient Medications  Medication Sig Dispense Refill  . amiodarone (PACERONE) 200 MG tablet Take 200 mg  by mouth every morning.     Marland Kitchen amLODipine (NORVASC) 5 MG tablet Take 1 tablet (5 mg total) by mouth daily. 90 tablet 1  . Ascorbic Acid (VITAMIN C) 100 MG tablet Take 100 mg by mouth daily.    Marland Kitchen aspirin EC 81 MG tablet Take 81 mg by mouth daily.     Marland Kitchen CRANBERRY PO Take 1 tablet by mouth daily.    . ferrous sulfate 325 (65 FE) MG EC tablet Take 1 tablet (325 mg total) by mouth 3 (three) times daily with meals. 270 tablet 1  . hydrALAZINE (APRESOLINE) 50 MG tablet Take 50 mg by mouth 2 (two) times daily.    . Multiple Vitamins-Minerals (MULTIVITAMIN WITH MINERALS) tablet Take 1 tablet by mouth daily.    Marland Kitchen sulfamethoxazole-trimethoprim (BACTRIM DS,SEPTRA DS) 800-160 MG tablet Take 1 tablet by mouth 2 (two) times daily. 14 tablet 0  . HYDROcodone-acetaminophen (NORCO) 5-325 MG tablet Take 1 tablet by mouth every 4 (four) hours as needed for moderate pain. (Patient not taking: Reported on 10/03/2017) 30 tablet 0  . lisinopril-hydrochlorothiazide (PRINZIDE,ZESTORETIC) 20-25 MG tablet Take 1 tablet by mouth 2 (two) times daily. (Patient not taking: Reported on 10/17/2017) 180 tablet 1  . ondansetron (ZOFRAN) 4 MG tablet Take 1 tablet (4 mg total) by mouth every 8 (eight) hours as needed for up to 10 doses for nausea or vomiting. (Patient not taking: Reported on 10/17/2017) 20 tablet 0   No current facility-administered medications for this visit.       Marland Kitchen  PHYSICAL EXAMINATION: ECOG PERFORMANCE STATUS: 1 - Symptomatic but completely ambulatory  Vitals:   10/17/17 1015  BP: 138/86  Pulse: 71  Resp: (!) 22  Temp: 98 F (36.7 C)   There were no vitals filed for this visit.  GENERAL: Well-nourished well-developed; Alert, no distress and comfortable.  She is alone.  EYES: no pallor or icterus OROPHARYNX: no thrush or ulceration; good dentition  NECK: supple.  LYMPH: Approximate 2 cm submandibular mass felt  no palpable lymphadenopathy in the axillary or inguinal regions LUNGS: Decreased breath  sounds at the right lower base.  No wheeze or crackles HEART/CVS: regular rate & rhythm and no murmurs; No lower extremity edema ABDOMEN: abdomen soft, non-tender and normal bowel sounds Musculoskeletal:no cyanosis of digits and no clubbing  PSYCH: alert & oriented x 3 with fluent speech NEURO: no focal motor/sensory deficits SKIN:  no rashes or significant lesions  LABORATORY DATA:  I have reviewed the data as listed Lab Results  Component Value Date   WBC 2.7 (L) 10/17/2017   HGB 11.3 (L) 10/17/2017   HCT 34.3 (L) 10/17/2017   MCV 85.2 10/17/2017   PLT 150 10/17/2017   Recent Labs    02/07/17 04/11/17 1409 07/19/17 1129 08/30/17 1138 10/03/17 1618  NA 139 138 142  --  142  K  3.9 4.1 3.8 3.9 3.8  CL  --  104 102  --  105  CO2  --  28 24  --  22  GLUCOSE  --  100* 93  --  101*  BUN 26* 36* 26*  --  24  CREATININE 0.8 1.09* 0.75  --  0.69  CALCIUM  --  9.5 9.3  --  8.8  GFRNONAA  --  54* 88  --  96  GFRAA  --  >60 101  --  110  PROT  --   --   --   --  6.8  ALBUMIN  --   --   --   --  3.9  AST 16  --   --   --  15  ALT 16  --   --   --  16  ALKPHOS  --   --   --   --  86  BILITOT  --   --   --   --  0.3    RADIOGRAPHIC STUDIES: I have personally reviewed the radiological images as listed and agreed with the findings in the report. Dg Chest 2 View  Result Date: 10/17/2017 CLINICAL DATA:  Cough EXAM: CHEST  2 VIEW COMPARISON:  Chest CT 05/02/2016 FINDINGS: Calcified granuloma in the left mid lung. Heart is borderline in size. Right lung is clear. No effusions or acute bony abnormality. IMPRESSION: Mild cardiomegaly.  Old granulomatous disease.  No active disease. Electronically Signed   By: Rolm Baptise M.D.   On: 10/17/2017 12:07    ASSESSMENT & PLAN:   Anemia due to chronic illness # Anemia- likely IDA vs anemia of chronic disease  [unclear etiology]- s/p IV iron; improved. Hb ~11; on iron pills 3 times a day. No IV iron today. Recommend continued oral iron.  #  Mild leukopenia white count 2.7/ANC 1.7. [BMBx- NEG] Asymptomatic.   #Intermittent thrombocytopenia-platelet 95 today within normal limits.  # cough- phlegm; yellow; no fevers; question bronchitis.  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  # follow up in 3 months/cbc;crp. Rheum panel.  Cc; Dr.Johnson.   All questions were answered. The patient knows to call the clinic with any problems, questions or concerns.     Cammie Sickle, MD 10/18/2017 1:38 PM

## 2017-10-18 ENCOUNTER — Telehealth: Payer: Self-pay | Admitting: Internal Medicine

## 2017-10-18 DIAGNOSIS — D638 Anemia in other chronic diseases classified elsewhere: Secondary | ICD-10-CM

## 2017-10-18 NOTE — Telephone Encounter (Signed)
Please have the patient come back in 1 month for labs; labs are already ordered in the computer.

## 2017-10-23 ENCOUNTER — Encounter: Payer: Self-pay | Admitting: Emergency Medicine

## 2017-10-23 ENCOUNTER — Emergency Department
Admission: EM | Admit: 2017-10-23 | Discharge: 2017-10-23 | Disposition: A | Discharge status: Home / Self Care | Payer: Medicare Other | Attending: Emergency Medicine | Admitting: Emergency Medicine

## 2017-10-23 ENCOUNTER — Other Ambulatory Visit: Payer: Self-pay

## 2017-10-23 ENCOUNTER — Emergency Department: Payer: Medicare Other

## 2017-10-23 DIAGNOSIS — I10 Essential (primary) hypertension: Secondary | ICD-10-CM | POA: Insufficient documentation

## 2017-10-23 DIAGNOSIS — J069 Acute upper respiratory infection, unspecified: Secondary | ICD-10-CM | POA: Insufficient documentation

## 2017-10-23 DIAGNOSIS — B9789 Other viral agents as the cause of diseases classified elsewhere: Secondary | ICD-10-CM | POA: Insufficient documentation

## 2017-10-23 DIAGNOSIS — Z87891 Personal history of nicotine dependence: Secondary | ICD-10-CM | POA: Insufficient documentation

## 2017-10-23 DIAGNOSIS — I2510 Atherosclerotic heart disease of native coronary artery without angina pectoris: Secondary | ICD-10-CM | POA: Diagnosis not present

## 2017-10-23 DIAGNOSIS — Z7982 Long term (current) use of aspirin: Secondary | ICD-10-CM | POA: Diagnosis not present

## 2017-10-23 DIAGNOSIS — Z955 Presence of coronary angioplasty implant and graft: Secondary | ICD-10-CM | POA: Insufficient documentation

## 2017-10-23 DIAGNOSIS — Z79899 Other long term (current) drug therapy: Secondary | ICD-10-CM | POA: Diagnosis not present

## 2017-10-23 DIAGNOSIS — R05 Cough: Secondary | ICD-10-CM | POA: Diagnosis not present

## 2017-10-23 DIAGNOSIS — E785 Hyperlipidemia, unspecified: Secondary | ICD-10-CM | POA: Diagnosis not present

## 2017-10-23 MED ORDER — BENZONATATE 100 MG PO CAPS: 200.0000 mg | ORAL_CAPSULE | Freq: Three times a day (TID) | ORAL | 0 refills | Status: DC | PRN

## 2017-10-23 MED ORDER — HYDROCOD POLST-CPM POLST ER 10-8 MG/5ML PO SUER: 5.0000 mL | Freq: Every evening | ORAL | 0 refills | Status: DC | PRN

## 2017-10-23 NOTE — ED Triage Notes (Signed)
Pt to ED via POV c/o cough x 2 weeks. Has tried OTC medication without relief. Pt in NAD at this time. Pt speaking in complete sentences, no distress noted.

## 2017-10-23 NOTE — ED Notes (Signed)
See triage note presents with a cough which started 2 weeks ago  No fever  States she has tried OTC meds w/o relief  No SOB noted at present   Speaking full sentences

## 2017-10-23 NOTE — ED Provider Notes (Signed)
[facility] Emergency Department Provider Note   ____________________________________________   First MD Initiated Contact with Patient 10/23/17 1605     (approximate)  I have reviewed the triage vital signs and the nursing notes.   HISTORY  Chief Complaint Cough    HPI Kristina Henson is a 62 y.o. female patient complaining intubating productive/nonproductive cough for 2 weeks.  Patient had no relief over-the-counter medications.  Patient denies fever/chills.  Patient states there is nasal congestion.  Patient denies nausea, vomiting, diarrhea.  Patient has taken flu shot for this season.   Past Medical History:  Diagnosis Date  . Anemia   . Arthritis    BACK-LUMBAR  . Atrial fibrillation ([place])   . Coronary artery disease   . Dysrhythmia   . Hypertension   . Mass in neck    lt side  . Pneumonia 2016  . Stroke ([place]) 2012  . Stroke [facility]) 2012    Patient Active Problem List   Diagnosis Date Noted  . Cough in adult 10/17/2017  . H/O submandibular gland removal 09/08/2017  . Liver hemangioma 06/14/2016  . 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 Anemia due to chronic illness 04/30/2016  . Iron deficiency anemia due to chronic blood loss   . Gastritis   . Duodenitis   . Benign neoplasm of sigmoid colon   . Benign hypertensive renal disease   . Atrial fibrillation ([place])   . HLD (hyperlipidemia) 07/11/2014  . History of CVA with residual deficit 07/11/2014    Past Surgical History:  Procedure Laterality Date  . ABDOMINAL HYSTERECTOMY     Total  . cardiac catherization    . COLONOSCOPY WITH PROPOFOL N/A 11/18/2015   Procedure: COLONOSCOPY WITH PROPOFOL;  Surgeon: Lucilla Lame, MD;  Location: [facility] ENDOSCOPY;  Service: Endoscopy;  Laterality: N/A;  . CORONARY ANGIOPLASTY    . ESOPHAGOGASTRODUODENOSCOPY (EGD) WITH PROPOFOL N/A 04/23/2016   Procedure: ESOPHAGOGASTRODUODENOSCOPY (EGD) WITH PROPOFOL;  Surgeon: Lucilla Lame, MD;  Location: [place];  Service: Endoscopy;  Laterality: N/A;  small bowel bx gastric antrum bx  . SUBMANDIBULAR GLAND EXCISION Left 09/08/2017   Procedure: EXCISION SUBMANDIBULAR GLAND;  Surgeon: Carloyn Manner, MD;  Location: [facility] ORS;  Service: ENT;  Laterality: Left;  . throat biopsy      Prior to Admission medications   Medication Sig Start Date End Date Taking? Authorizing Provider  amiodarone (PACERONE) 200 MG tablet Take 200 mg by mouth every morning.     [provider]  amLODipine (NORVASC) 5 MG tablet Take 1 tablet (5 mg total) by mouth daily. 10/03/17   Johnson, Megan P, DO  Ascorbic Acid (VITAMIN C) 100 MG tablet Take 100 mg by mouth daily.    [provider]  aspirin EC 81 MG tablet Take 81 mg by mouth daily.     [provider]  benzonatate (TESSALON PERLES) 100 MG capsule Take 2 capsules (200 mg total) by mouth 3 (three) times daily as needed. 10/23/17 10/23/18  Sable Feil, PA-C  chlorpheniramine-HYDROcodone (TUSSIONEX PENNKINETIC ER) 10-8 MG/5ML SUER Take 5 mLs by mouth at bedtime as needed for cough. 10/23/17   Sable Feil, PA-C  CRANBERRY PO Take 1 tablet by mouth daily.    [provider]  ferrous sulfate 325 (65 FE) MG EC tablet Take 1 tablet (325 mg total) by mouth 3 (three) times daily with meals. 10/03/17   Johnson, Megan P, DO  hydrALAZINE (APRESOLINE) 50 MG tablet Take 50 mg by  mouth 2 (two) times daily.    [provider]  HYDROcodone-acetaminophen (NORCO) 5-325 MG tablet Take 1 tablet by mouth every 4 (four) hours as needed for moderate pain. Patient not taking: Reported on 10/03/2017 09/09/17   Carloyn Manner, MD  lisinopril-hydrochlorothiazide (PRINZIDE,ZESTORETIC) 20-25 MG tablet Take 1 tablet by mouth 2 (two) times daily. Patient not taking: Reported on 10/17/2017 10/03/17   Park Liter P, DO  Multiple Vitamins-Minerals (MULTIVITAMIN WITH MINERALS) tablet Take 1 tablet by mouth daily.    [provider]  ondansetron  (ZOFRAN) 4 MG tablet Take 1 tablet (4 mg total) by mouth every 8 (eight) hours as needed for up to 10 doses for nausea or vomiting. Patient not taking: Reported on 10/17/2017 09/09/17   Carloyn Manner, MD  sulfamethoxazole-trimethoprim (BACTRIM DS,SEPTRA DS) 800-160 MG tablet Take 1 tablet by mouth 2 (two) times daily. 10/07/17   Park Liter P, DO    Allergies Amoxicillin  Family History  Problem Relation Age of Onset  . Arthritis Mother   . Cancer Mother   . Hypertension Sister   . Asthma Brother   . Dementia Brother   . Breast cancer Neg Hx   . Kidney cancer Neg Hx   . Bladder Cancer Neg Hx     Social History Social History   Tobacco Use  . Smoking status: Former Smoker    Last attempt to quit: 1978    Years since quitting: 41.0  . Smokeless tobacco: Never Used  Substance Use Topics  . Alcohol use: Yes    Alcohol/week: 0.6 oz    Types: 1 Glasses of wine per week  . Drug use: No    Review of Systems Constitutional: No fever/chills Eyes: No visual changes. ENT: No sore throat.  Nasal congestion intermittent rhinorrhea Cardiovascular: Denies chest pain. Respiratory: Denies shortness of breath.  Intermitting productive nonproductive cough Gastrointestinal: No abdominal pain.  No nausea, no vomiting.  No diarrhea.  No constipation. Genitourinary: Negative for dysuria. Musculoskeletal: Negative for back pain. Skin: Negative for rash. Neurological: Negative for headaches,  Endocrine:Hyperlipidemia Allergic/Immunilogical: Amoxicillin ____________________________________________   PHYSICAL EXAM:  VITAL SIGNS: ED Triage Vitals [10/23/17 1542]  Enc Vitals Group     BP (!) 135/56     Pulse      Resp 20     Temp 98.2 F (36.8 C)     Temp Source Oral     SpO2 96 %     Weight 165 lb (74.8 kg)     Height 5\' 2"  (1.575 m)     Head Circumference      Peak Flow      Pain Score      Pain Loc      Pain Edu?      Excl. in [place]?    Constitutional: Alert and  oriented. Well appearing and in no acute distress. Nose: No congestion/rhinnorhea. Mouth/Throat: Mucous membranes are moist.  Oropharynx non-erythematous.  Postnasal drainage Neck: No stridor.  Cardiovascular: Normal rate, regular rhythm. Grossly normal heart sounds.  Good peripheral circulation. Respiratory: Normal respiratory effort.  No retractions. Lungs CTAB. Neurologic:  Normal speech and language. No gross focal neurologic deficits are appreciated. No gait instability. Skin:  Skin is warm, dry and intact. No rash noted. Psychiatric: Mood and affect are normal. Speech and behavior are normal.  ____________________________________________   LABS (all labs ordered are listed, but only abnormal results are displayed)  Labs Reviewed - No data to display ____________________________________________  EKG   ____________________________________________  RADIOLOGY  Dg Chest 2 View  Result Date: 10/23/2017 CLINICAL DATA:  Nonproductive cough for 2 weeks. EXAM: CHEST  2 VIEW COMPARISON:  10/17/2017 FINDINGS: Stable mild cardiomegaly. Aortic atherosclerosis. No evidence of pulmonary infiltrate or pleural effusion. Tiny calcified granuloma in the left midlung remains stable. 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 IMPRESSION: Stable mild cardiomegaly.  No active lung disease. Electronically Signed   By: Earle Gell M.D.   On: 10/23/2017 16:50    ____________________________________________   PROCEDURES  Procedure(s) performed: None  Procedures  Critical Care performed: No  ____________________________________________   INITIAL IMPRESSION / ASSESSMENT AND PLAN / ED COURSE  As part of my medical decision making, I reviewed the following data within the electronic MEDICAL RECORD NUMBER    Viral respiratory infection with cough.  Discussed x-ray findings with patient.  Patient given discharge care instruction advised take medication as  directed.  Patient advised follow-up PCP if condition persists.      ____________________________________________   FINAL CLINICAL IMPRESSION(S) / ED DIAGNOSES  Final diagnoses:  Viral URI with cough     ED Discharge Orders        Ordered    benzonatate (TESSALON PERLES) 100 MG capsule  3 times daily PRN     10/23/17 1702    chlorpheniramine-HYDROcodone (TUSSIONEX PENNKINETIC ER) 10-8 MG/5ML SUER  At bedtime PRN     10/23/17 1702       Note:  This document was prepared using Dragon voice recognition software and may include unintentional dictation errors.    Sable Feil, PA-C 10/23/17 1704    Nance Pear, MD 10/23/17 [phone]

## 2017-11-14 ENCOUNTER — Ambulatory Visit: Payer: Medicare Other | Admitting: Internal Medicine

## 2017-11-14 ENCOUNTER — Other Ambulatory Visit: Payer: Medicare Other

## 2018-01-23 ENCOUNTER — Encounter: Payer: Self-pay | Admitting: Internal Medicine

## 2018-01-23 ENCOUNTER — Other Ambulatory Visit: Payer: Self-pay

## 2018-01-23 ENCOUNTER — Inpatient Hospital Stay: Payer: Medicare Other | Attending: Internal Medicine | Admitting: Internal Medicine

## 2018-01-23 ENCOUNTER — Inpatient Hospital Stay: Payer: Medicare Other

## 2018-01-23 VITALS — BP 125/71 | HR 68 | Temp 97.7°F | Resp 18 | Wt 173.2 lb

## 2018-01-23 DIAGNOSIS — D638 Anemia in other chronic diseases classified elsewhere: Secondary | ICD-10-CM

## 2018-01-23 DIAGNOSIS — D72819 Decreased white blood cell count, unspecified: Secondary | ICD-10-CM | POA: Diagnosis not present

## 2018-01-23 DIAGNOSIS — D649 Anemia, unspecified: Secondary | ICD-10-CM | POA: Insufficient documentation

## 2018-01-23 DIAGNOSIS — D696 Thrombocytopenia, unspecified: Secondary | ICD-10-CM | POA: Diagnosis not present

## 2018-01-23 DIAGNOSIS — D500 Iron deficiency anemia secondary to blood loss (chronic): Secondary | ICD-10-CM

## 2018-01-23 LAB — CBC WITH DIFFERENTIAL/PLATELET
BASOS PCT: 1 %
Basophils Absolute: 0 10*3/uL (ref 0–0.1)
EOS ABS: 0 10*3/uL (ref 0–0.7)
EOS PCT: 2 %
HCT: 32.6 % — ABNORMAL LOW (ref 35.0–47.0)
HEMOGLOBIN: 10.9 g/dL — AB (ref 12.0–16.0)
Lymphocytes Relative: 22 %
Lymphs Abs: 0.5 10*3/uL — ABNORMAL LOW (ref 1.0–3.6)
MCH: 27.7 pg (ref 26.0–34.0)
MCHC: 33.6 g/dL (ref 32.0–36.0)
MCV: 82.6 fL (ref 80.0–100.0)
MONOS PCT: 7 %
Monocytes Absolute: 0.2 10*3/uL (ref 0.2–0.9)
NEUTROS PCT: 68 %
Neutro Abs: 1.5 10*3/uL (ref 1.4–6.5)
Platelets: 142 10*3/uL — ABNORMAL LOW (ref 150–440)
RBC: 3.94 MIL/uL (ref 3.80–5.20)
RDW: 16.8 % — ABNORMAL HIGH (ref 11.5–14.5)
WBC: 2.2 10*3/uL — AB (ref 3.6–11.0)

## 2018-01-23 LAB — C-REACTIVE PROTEIN: CRP: 2.8 mg/dL — ABNORMAL HIGH (ref <1.0)

## 2018-01-23 NOTE — Assessment & Plan Note (Deleted)
#   Anemia- likely IDA vs anemia of chronic disease  [unclear etiology]- s/p IV iron; improved. Hb ~11; on iron pills 3 times a day. No IV iron today. Recommend continued oral iron.  # Mild leukopenia white count 2.7/ANC 1.7. [BMBx- NEG] Asymptomatic.   #Intermittent thrombocytopenia-platelet 95 today within normal limits.  # cough- phlegm; yellow; no fevers; question bronchitis.  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  # follow up in 3 months/cbc;crp. Rheum panel.  Cc; Dr.Johnson.

## 2018-01-23 NOTE — Assessment & Plan Note (Signed)
#   Anemia- likely IDA vs anemia of chronic disease  [unclear etiology]- s/p IV iron; improved. Hb ~10.5; on iron pills 3 times a day. Recommend continued oral iron.  # Mild leukopenia white count 2.2/ANC 1.5. [BMBx- NEG] Asymptomatic.   #Intermittent thrombocytopenia-platelet 142 today within normal limits.  # follow up in 6 months/cbc;- Rheum panel;will call if abnormal- [phone].   Cc; Dr.Johnson.

## 2018-01-23 NOTE — Progress Notes (Signed)
[place] CONSULT NOTE  Patient Care Team: Valerie Roys, DO as PCP - General (Family Medicine) Ubaldo Glassing Javier Docker, MD as Consulting Physician (Cardiology) Carloyn Manner, MD as Referring Physician (Otolaryngology)  CHIEF COMPLAINTS/PURPOSE OF CONSULTATION:   # Anemia of chronic disease- EGD/ [place] [Dr.Wohl; 2017] chronic back pain [? 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     HISTORY OF PRESENTING ILLNESS:  Kristina Henson 62 y.o.  female noted Moderate anemia of Unclear etiology- iron deficiency versus chronic disease is here for follow-up.  Patient states her back pain/joint pains have improved.  Her appetite is good.  No weight loss.  No nausea no vomiting.  She continues to be on p.o. iron.  No constipation or dyspepsia.  ROS: A complete 10 point review of system is done which is negative except mentioned above in history of present illness  MEDICAL HISTORY:  Past Medical History:  Diagnosis Date  . Anemia   . Arthritis    BACK-LUMBAR  . Atrial fibrillation ([place])   . Coronary artery disease   . Dysrhythmia   . Hypertension   . Mass in neck    lt side  . Pneumonia 2016  . Stroke ([place]) 2012  . Stroke [facility]) 2012    SURGICAL HISTORY: Past Surgical History:  Procedure Laterality Date  . ABDOMINAL HYSTERECTOMY     Total  . cardiac catherization    . COLONOSCOPY WITH PROPOFOL N/A 11/18/2015   Procedure: COLONOSCOPY WITH PROPOFOL;  Surgeon: Lucilla Lame, MD;  Location: [facility] ENDOSCOPY;  Service: Endoscopy;  Laterality: N/A;  . CORONARY ANGIOPLASTY    . ESOPHAGOGASTRODUODENOSCOPY (EGD) WITH PROPOFOL N/A 04/23/2016   Procedure: ESOPHAGOGASTRODUODENOSCOPY (EGD) WITH PROPOFOL;  Surgeon: Lucilla Lame, MD;  Location: [place];  Service:  Endoscopy;  Laterality: N/A;  small bowel bx gastric antrum bx  . SUBMANDIBULAR GLAND EXCISION Left 09/08/2017   Procedure: EXCISION SUBMANDIBULAR GLAND;  Surgeon: Carloyn Manner, MD;  Location: [facility] ORS;  Service: ENT;  Laterality: Left;  . throat biopsy      SOCIAL HISTORY: Social History   Socioeconomic History  . Marital status: Divorced    Spouse name: Not on file  . Number of children: Not on file  . Years of education: 84  . Highest education level: 12th grade  Occupational History  . Not on file  Social Needs  . Financial resource strain: Not hard at all  . Food insecurity:    Worry: Never true    Inability: Never true  . Transportation needs:    Medical: No    Non-medical: No  Tobacco Use  . Smoking status: Former Smoker    Last attempt to quit: 1978    Years since quitting: 41.3  . Smokeless tobacco: Never Used  Substance and Sexual Activity  . Alcohol use: Yes    Alcohol/week: 0.6 oz    Types: 1 Glasses of wine per week  . Drug use: No  . Sexual activity: Yes  Lifestyle  . Physical activity:    Days per week: 5 days    Minutes per session: 50 min  . Stress: Not at all  Relationships  . Social connections:    Talks on phone: Once a week    Gets together: More than three times a week  Attends religious service: Never    Active member of club or organization: No    Attends meetings of clubs or organizations: Never    Relationship status: Divorced  . Intimate partner violence:    Fear of current or ex partner: No    Emotionally abused: No    Physically abused: No    Forced sexual activity: No  Other Topics Concern  . Not on file  Social History Narrative  . Not on file    FAMILY HISTORY: Family History  Problem Relation Age of Onset  . Arthritis Mother   . Cancer Mother   . Hypertension Sister   . Asthma Brother   . Dementia Brother   . Breast cancer Neg Hx   . Kidney cancer Neg Hx   . Bladder Cancer Neg Hx     ALLERGIES:  is  allergic to amoxicillin.  MEDICATIONS:  Current Outpatient Medications  Medication Sig Dispense Refill  . amiodarone (PACERONE) 200 MG tablet Take 200 mg by mouth every morning.     Marland Kitchen amLODipine (NORVASC) 5 MG tablet Take 1 tablet (5 mg total) by mouth daily. 90 tablet 1  . Ascorbic Acid (VITAMIN C) 100 MG tablet Take 100 mg by mouth daily.    Marland Kitchen aspirin EC 81 MG tablet Take 81 mg by mouth daily.     Marland Kitchen CRANBERRY PO Take 1 tablet by mouth daily.    . ferrous sulfate 325 (65 FE) MG EC tablet Take 1 tablet (325 mg total) by mouth 3 (three) times daily with meals. 270 tablet 1  . hydrALAZINE (APRESOLINE) 50 MG tablet Take 50 mg by mouth 2 (two) times daily.    Marland Kitchen lisinopril-hydrochlorothiazide (PRINZIDE,ZESTORETIC) 20-25 MG tablet Take 1 tablet by mouth 2 (two) times daily. 180 tablet 1  . Multiple Vitamins-Minerals (MULTIVITAMIN WITH MINERALS) tablet Take 1 tablet by mouth daily.    Marland Kitchen HYDROcodone-acetaminophen (NORCO) 5-325 MG tablet Take 1 tablet by mouth every 4 (four) hours as needed for moderate pain. (Patient not taking: Reported on 10/03/2017) 30 tablet 0  . ondansetron (ZOFRAN) 4 MG tablet Take 1 tablet (4 mg total) by mouth every 8 (eight) hours as needed for up to 10 doses for nausea or vomiting. (Patient not taking: Reported on 10/17/2017) 20 tablet 0   No current facility-administered medications for this visit.       Marland Kitchen  PHYSICAL EXAMINATION: ECOG PERFORMANCE STATUS: 1 - Symptomatic but completely ambulatory  Vitals:   01/23/18 1427  BP: 125/71  Pulse: 68  Resp: 18  Temp: 97.7 F (36.5 C)   Filed Weights   01/23/18 1427  Weight: 173 lb 3.2 oz (78.6 kg)    GENERAL: Well-nourished well-developed; Alert, no distress and comfortable.  She is alone.  EYES: no pallor or icterus OROPHARYNX: no thrush or ulceration; good dentition  NECK: supple.  LYMPH: Approximate 2 cm submandibular mass felt  no palpable lymphadenopathy in the axillary or inguinal regions LUNGS: Decreased  breath sounds at the right lower base.  No wheeze or crackles HEART/CVS: regular rate & rhythm and no murmurs; No lower extremity edema ABDOMEN: abdomen soft, non-tender and normal bowel sounds Musculoskeletal:no cyanosis of digits and no clubbing  PSYCH: alert & oriented x 3 with fluent speech NEURO: no focal motor/sensory deficits SKIN:  no rashes or significant lesions  LABORATORY DATA:  I have reviewed the data as listed Lab Results  Component Value Date   WBC 2.2 (L) 01/23/2018   HGB 10.9 (L) 01/23/2018  HCT 32.6 (L) 01/23/2018   MCV 82.6 01/23/2018   PLT 142 (L) 01/23/2018   Recent Labs    02/07/17 04/11/17 1409 07/19/17 1129 08/30/17 1138 10/03/17 1618  NA 139 138 142  --  142  K 3.9 4.1 3.8 3.9 3.8  CL  --  104 102  --  105  CO2  --  28 24  --  22  GLUCOSE  --  100* 93  --  101*  BUN 26* 36* 26*  --  24  CREATININE 0.8 1.09* 0.75  --  0.69  CALCIUM  --  9.5 9.3  --  8.8  GFRNONAA  --  54* 88  --  96  GFRAA  --  >60 101  --  110  PROT  --   --   --   --  6.8  ALBUMIN  --   --   --   --  3.9  AST 16  --   --   --  15  ALT 16  --   --   --  16  ALKPHOS  --   --   --   --  86  BILITOT  --   --   --   --  0.3    RADIOGRAPHIC STUDIES: I have personally reviewed the radiological images as listed and agreed with the findings in the report. No results found.  ASSESSMENT & PLAN:   Anemia due to chronic illness # Anemia- likely IDA vs anemia of chronic disease  [unclear etiology]- s/p IV iron; improved. Hb ~10.5; on iron pills 3 times a day. Recommend continued oral iron.  # Mild leukopenia white count 2.2/ANC 1.5. [BMBx- NEG] Asymptomatic.   #Intermittent thrombocytopenia-platelet 142 today within normal limits.  # follow up in 6 months/cbc;- Rheum panel;will call if abnormal- [id number].   Cc; Dr.Johnson.   All questions were answered. The patient knows to call the clinic with any problems, questions or concerns.     Cammie Sickle, MD 01/24/2018  8:17 AM

## 2018-01-23 NOTE — Progress Notes (Signed)
Here for follow up. Pt stated she is " doing good and energy is good too "

## 2018-01-24 LAB — RHEUMATOID ARTHRITIS PROFILE
CCP ANTIBODIES IGG/IGA: 11 U (ref 0–19)
Rhuematoid fact SerPl-aCnc: <10 IU/mL (ref 0.0–13.9)

## 2018-02-10 ENCOUNTER — Ambulatory Visit (INDEPENDENT_AMBULATORY_CARE_PROVIDER_SITE_OTHER): Payer: Medicare Other | Admitting: Family Medicine

## 2018-02-10 ENCOUNTER — Encounter: Payer: Self-pay | Admitting: Unknown Physician Specialty

## 2018-02-10 VITALS — BP 118/76 | HR 65 | Temp 98.6°F | Ht 62.0 in | Wt 173.4 lb

## 2018-02-10 DIAGNOSIS — M7989 Other specified soft tissue disorders: Secondary | ICD-10-CM

## 2018-02-10 DIAGNOSIS — R300 Dysuria: Secondary | ICD-10-CM | POA: Diagnosis not present

## 2018-02-10 LAB — UA/M W/RFLX CULTURE, ROUTINE
Bilirubin, UA: NEGATIVE
Glucose, UA: NEGATIVE
Ketones, UA: NEGATIVE
Nitrite, UA: NEGATIVE
PH UA: 5 (ref 5.0–7.5)
Protein, UA: NEGATIVE
RBC, UA: NEGATIVE
Specific Gravity, UA: 1.015 (ref 1.005–1.030)
Urobilinogen, Ur: 0.2 mg/dL (ref 0.2–1.0)

## 2018-02-10 LAB — MICROSCOPIC EXAMINATION

## 2018-02-10 NOTE — Progress Notes (Signed)
BP 118/76   Pulse 65   Temp 98.6 F (37 C) (Oral)   Ht 5\' 2"  (1.575 m)   Wt 173 lb 6.4 oz (78.7 kg)   SpO2 98%   BMI 31.72 kg/m    Subjective:    Patient ID: Kristina Henson, female    DOB: 1956/08/07, 62 y.o.   MRN: [id number]  HPI: Kristina Henson is a 62 y.o. female  Chief Complaint  Patient presents with  . Foot Swelling    pt states her left foot was swollen for 2 to 3 days, states the swelling has gone down now    Notes that her L foot was swollen a couple of days ago and then it seems to have gone away. Doesn't remember doing anything to her ankle, but has been working on trying to lose weight, so has been exercising. Walking in sandles since the weather has been nice. Was not red. Hurt when she touched it. Hurt right in her ankle.   URINARY SYMPTOMS Duration: Couple of days Dysuria: no Urinary frequency: yes Urgency: yes Small volume voids: yes Symptom severity: moderate Urinary incontinence: no Foul odor: no Hematuria: no Abdominal pain: no Back pain: no Suprapubic pain/pressure: yes Flank pain: no Fever:  no Vomiting: no Relief with cranberry juice: no Relief with pyridium: no Status:stable Previous urinary tract infection: yes Recurrent urinary tract infection: yes Vaginal discharge: no Treatments attempted: increasing fluids    Relevant past medical, surgical, family and social history reviewed and updated as indicated. Interim medical history since our last visit reviewed. Allergies and medications reviewed and updated.  Review of Systems  Constitutional: Negative.   Respiratory: Negative.   Cardiovascular: Negative.   Musculoskeletal: Positive for arthralgias and joint swelling. Negative for back pain, gait problem, myalgias, neck pain and neck stiffness.  Skin: Negative.   Neurological: Negative.   Psychiatric/Behavioral: Negative.     Per HPI unless specifically indicated above     Objective:    BP 118/76   Pulse 65   Temp 98.6 F  (37 C) (Oral)   Ht 5\' 2"  (1.575 m)   Wt 173 lb 6.4 oz (78.7 kg)   SpO2 98%   BMI 31.72 kg/m   Wt Readings from Last 3 Encounters:  02/10/18 173 lb 6.4 oz (78.7 kg)  01/23/18 173 lb 3.2 oz (78.6 kg)  10/23/17 165 lb (74.8 kg)    Physical Exam  Constitutional: She is oriented to person, place, and time. She appears well-developed and well-nourished. No distress.  HENT:  Head: Normocephalic and atraumatic.  Right Ear: Hearing normal.  Left Ear: Hearing normal.  Nose: Nose normal.  Eyes: Conjunctivae and lids are normal. Right eye exhibits no discharge. Left eye exhibits no discharge. No scleral icterus.  Cardiovascular: Normal rate, regular rhythm, normal heart sounds and intact distal pulses. Exam reveals no gallop and no friction rub.  No murmur heard. Pulmonary/Chest: Effort normal and breath sounds normal. No stridor. No respiratory distress. She has no wheezes. She has no rales. She exhibits no tenderness.  Musculoskeletal: Normal range of motion. She exhibits tenderness (at anterior joint line of the L ankle). She exhibits no edema or deformity.  Neurological: She is alert and oriented to person, place, and time.  Skin: Skin is warm, dry and intact. Capillary refill takes less than 2 seconds. No rash noted. She is not diaphoretic. No erythema. No pallor.  Psychiatric: She has a normal mood and affect. Her speech is normal and behavior is  normal. Judgment and thought content normal. Cognition and memory are normal.  Nursing note and vitals reviewed.   Results for orders placed or performed in visit on 01/23/18  C-reactive protein  Result Value Ref Range   CRP 2.8 (H) <1.0 mg/dL  CBC with Differential/Platelet  Result Value Ref Range   WBC 2.2 (L) 3.6 - 11.0 K/uL   RBC 3.94 3.80 - 5.20 MIL/uL   Hemoglobin 10.9 (L) 12.0 - 16.0 g/dL   HCT 32.6 (L) 35.0 - 47.0 %   MCV 82.6 80.0 - 100.0 fL   MCH 27.7 26.0 - 34.0 pg   MCHC 33.6 32.0 - 36.0 g/dL   RDW 16.8 (H) 11.5 - 14.5 %    Platelets 142 (L) 150 - 440 K/uL   Neutrophils Relative % 68 %   Neutro Abs 1.5 1.4 - 6.5 K/uL   Lymphocytes Relative 22 %   Lymphs Abs 0.5 (L) 1.0 - 3.6 K/uL   Monocytes Relative 7 %   Monocytes Absolute 0.2 0.2 - 0.9 K/uL   Eosinophils Relative 2 %   Eosinophils Absolute 0.0 0 - 0.7 K/uL   Basophils Relative 1 %   Basophils Absolute 0.0 0 - 0.1 K/uL  Rheumatoid Arthritis Profile  Result Value Ref Range   Rhuematoid fact SerPl-aCnc <10.0 0.0 - 13.9 IU/mL   CCP Antibodies IgG/IgA 11 0 - 19 units      Assessment & Plan:   Problem List Items Addressed This Visit    None    Visit Diagnoses    Foot swelling    -  Primary   Likely irritated from walking in sandles. Start exercises. Check uric acid for gout. Call with any concerns.    Relevant Orders   Uric acid   Dysuria       UA clear. Call if continuing with symptoms. Call with any concerns.    Relevant Orders   UA/M w/rflx Culture, Routine       Follow up plan: Return if symptoms worsen or fail to improve.

## 2018-02-11 LAB — URIC ACID: Uric Acid: 7 mg/dL (ref 2.5–7.1)

## 2018-02-13 ENCOUNTER — Telehealth: Payer: Self-pay | Admitting: Family Medicine

## 2018-02-13 NOTE — Telephone Encounter (Signed)
Called and left a message letting her know her labs were normal.

## 2018-02-13 NOTE — Telephone Encounter (Signed)
Please let her know that her labs are normal.

## 2018-03-06 ENCOUNTER — Emergency Department: Payer: Medicare Other

## 2018-03-06 ENCOUNTER — Emergency Department
Admission: EM | Admit: 2018-03-06 | Discharge: 2018-03-06 | Disposition: A | Discharge status: Home / Self Care | Payer: Medicare Other | Attending: Student in an Organized Health Care Education/Training Program | Admitting: Student in an Organized Health Care Education/Training Program

## 2018-03-06 ENCOUNTER — Other Ambulatory Visit: Payer: Self-pay

## 2018-03-06 DIAGNOSIS — R0989 Other specified symptoms and signs involving the circulatory and respiratory systems: Secondary | ICD-10-CM | POA: Diagnosis not present

## 2018-03-06 DIAGNOSIS — Z87891 Personal history of nicotine dependence: Secondary | ICD-10-CM | POA: Diagnosis not present

## 2018-03-06 DIAGNOSIS — I2510 Atherosclerotic heart disease of native coronary artery without angina pectoris: Secondary | ICD-10-CM | POA: Insufficient documentation

## 2018-03-06 DIAGNOSIS — I10 Essential (primary) hypertension: Secondary | ICD-10-CM | POA: Diagnosis not present

## 2018-03-06 DIAGNOSIS — J189 Pneumonia, unspecified organism: Secondary | ICD-10-CM

## 2018-03-06 DIAGNOSIS — J181 Lobar pneumonia, unspecified organism: Secondary | ICD-10-CM | POA: Diagnosis not present

## 2018-03-06 DIAGNOSIS — Z79899 Other long term (current) drug therapy: Secondary | ICD-10-CM | POA: Insufficient documentation

## 2018-03-06 DIAGNOSIS — J168 Pneumonia due to other specified infectious organisms: Secondary | ICD-10-CM | POA: Diagnosis not present

## 2018-03-06 DIAGNOSIS — R05 Cough: Secondary | ICD-10-CM | POA: Diagnosis present

## 2018-03-06 MED ORDER — IPRATROPIUM-ALBUTEROL 0.5-2.5 (3) MG/3ML IN SOLN
3.0000 mL | Freq: Once | RESPIRATORY_TRACT | Status: AC
  Administered 2018-03-06: 3 mL via RESPIRATORY_TRACT
  Filled 2018-03-06: qty 3

## 2018-03-06 MED ORDER — LEVOFLOXACIN 750 MG PO TABS
750.0000 mg | ORAL_TABLET | Freq: Once | ORAL | Status: DC
  Filled 2018-03-06: qty 1

## 2018-03-06 MED ORDER — CEFTRIAXONE SODIUM 1 G IJ SOLR
1.0000 g | Freq: Once | INTRAMUSCULAR | Status: AC
  Administered 2018-03-06: 1 g via INTRAMUSCULAR
  Filled 2018-03-06: qty 10

## 2018-03-06 MED ORDER — ALBUTEROL SULFATE HFA 108 (90 BASE) MCG/ACT IN AERS: 2.0000 | INHALATION_SPRAY | Freq: Four times a day (QID) | RESPIRATORY_TRACT | 2 refills | Status: DC | PRN

## 2018-03-06 MED ORDER — BENZONATATE 100 MG PO CAPS: 200.0000 mg | ORAL_CAPSULE | Freq: Three times a day (TID) | ORAL | 0 refills | Status: DC | PRN

## 2018-03-06 MED ORDER — DOXYCYCLINE HYCLATE 100 MG PO CAPS: 100.0000 mg | ORAL_CAPSULE | Freq: Two times a day (BID) | ORAL | 0 refills | Status: DC

## 2018-03-06 NOTE — Discharge Instructions (Signed)
Call make an appointment with your primary care provider for follow-up.  Begin taking Tessalon Perles 2 every 8 hours as needed for cough, doxycycline 100 mg twice daily for the next 10 days and use albuterol inhaler if needed for shortness of breath, or wheezing.  Return to the ED if any severe worsening of your symptoms.  Take Tylenol if needed for fever or body aches.

## 2018-03-06 NOTE — ED Triage Notes (Addendum)
Pt started with cough Saturday night - no relief from OTC cough medicine - pt denies any other symptoms Pt has not coughed while in triage

## 2018-03-06 NOTE — ED Provider Notes (Signed)
[facility] Emergency Department Provider Note  ____________________________________________   First MD Initiated Contact with Patient 03/06/18 1415     (approximate)  I have reviewed the triage vital signs and the nursing notes.   HISTORY  Chief Complaint Cough   HPI Kristina Henson is a 62 y.o. female is here complaint of cough for approximately 3 to 4 days.  Patient is unaware of any fever but states that she has been taking over-the-counter medication and has not had any relief.  Patient has had a productive cough.  She denies any smoking since her teenage years.   Past Medical History:  Diagnosis Date  . Anemia   . Arthritis    BACK-LUMBAR  . Atrial fibrillation ([place])   . Coronary artery disease   . Dysrhythmia   . Hypertension   . Mass in neck    lt side  . Pneumonia 2016  . Stroke ([place]) 2012  . Stroke [facility]) 2012    Patient Active Problem List   Diagnosis Date Noted  . Cough in adult 10/17/2017  . H/O submandibular gland removal 09/08/2017  . Liver hemangioma 06/14/2016  . 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 Anemia due to chronic illness 04/30/2016  . Iron deficiency anemia due to chronic blood loss   . Gastritis   . Duodenitis   . Benign neoplasm of sigmoid colon   . Benign hypertensive renal disease   . Atrial fibrillation ([place])   . HLD (hyperlipidemia) 07/11/2014  . History of CVA with residual deficit 07/11/2014    Past Surgical History:  Procedure Laterality Date  . ABDOMINAL HYSTERECTOMY     Total  . cardiac catherization    . COLONOSCOPY WITH PROPOFOL N/A 11/18/2015   Procedure: COLONOSCOPY WITH PROPOFOL;  Surgeon: Lucilla Lame, MD;  Location: [facility] ENDOSCOPY;  Service: Endoscopy;  Laterality: N/A;  . CORONARY ANGIOPLASTY    . ESOPHAGOGASTRODUODENOSCOPY (EGD) WITH PROPOFOL N/A 04/23/2016   Procedure: ESOPHAGOGASTRODUODENOSCOPY (EGD) WITH PROPOFOL;  Surgeon: Lucilla Lame, MD;  Location: [place];  Service:  Endoscopy;  Laterality: N/A;  small bowel bx gastric antrum bx  . SUBMANDIBULAR GLAND EXCISION Left 09/08/2017   Procedure: EXCISION SUBMANDIBULAR GLAND;  Surgeon: Carloyn Manner, MD;  Location: [facility] ORS;  Service: ENT;  Laterality: Left;  . throat biopsy      Prior to Admission medications   Medication Sig Start Date End Date Taking? Authorizing Provider  albuterol (PROVENTIL HFA;VENTOLIN HFA) 108 (90 Base) MCG/ACT inhaler Inhale 2 puffs into the lungs every 6 (six) hours as needed for wheezing or shortness of breath. 03/06/18   Johnn Hai, PA-C  amiodarone (PACERONE) 200 MG tablet Take 200 mg by mouth every morning.     [provider]  amLODipine (NORVASC) 5 MG tablet Take 1 tablet (5 mg total) by mouth daily. 10/03/17   Johnson, Megan P, DO  Ascorbic Acid (VITAMIN C) 100 MG tablet Take 100 mg by mouth daily.    [provider]  aspirin EC 81 MG tablet Take 81 mg by mouth daily.     [provider]  benzonatate (TESSALON PERLES) 100 MG capsule Take 2 capsules (200 mg total) by mouth 3 (three) times daily as needed. 03/06/18 03/06/19  Johnn Hai, PA-C  CRANBERRY PO Take 1 tablet by mouth daily.    [provider]  doxycycline (VIBRAMYCIN) 100 MG capsule Take 1 capsule (100 mg total) by mouth 2 (two) times daily. 03/06/18   Johnn Hai, PA-C  ferrous sulfate 325 (65 FE) MG EC tablet Take 1 tablet (325 mg total) by mouth 3 (three) times daily with meals. 10/03/17   Johnson, Megan P, DO  hydrALAZINE (APRESOLINE) 50 MG tablet Take 50 mg by mouth 2 (two) times daily.    [provider]  lisinopril-hydrochlorothiazide (PRINZIDE,ZESTORETIC) 20-25 MG tablet Take 1 tablet by mouth 2 (two) times daily. 10/03/17   Park Liter P, DO  Multiple Vitamins-Minerals (MULTIVITAMIN WITH MINERALS) tablet Take 1 tablet by mouth daily.    [provider]    Allergies Amoxicillin  Family History  Problem Relation Age of Onset  . Arthritis  Mother   . Cancer Mother   . Hypertension Sister   . Asthma Brother   . Dementia Brother   . Breast cancer Neg Hx   . Kidney cancer Neg Hx   . Bladder Cancer Neg Hx     Social History Social History   Tobacco Use  . Smoking status: Former Smoker    Last attempt to quit: 1978    Years since quitting: 41.4  . Smokeless tobacco: Never Used  Substance Use Topics  . Alcohol use: Not Currently    Frequency: Never  . Drug use: No    Review of Systems Constitutional: No known fever/chills ENT: No sore throat. Cardiovascular: Denies chest pain. Respiratory: Positive productive cough with occasionally shortness of breath.  Denies wheezing. Gastrointestinal:   No nausea, no vomiting.   Musculoskeletal: Negative for back pain. Skin: Negative for rash. Neurological: Negative for headaches ____________________________________________   PHYSICAL EXAM:  VITAL SIGNS: ED Triage Vitals  Enc Vitals Group     BP 03/06/18 1351 (!) 131/55     Pulse Rate 03/06/18 1351 62     Resp 03/06/18 1351 14     Temp 03/06/18 1351 98.7 F (37.1 C)     Temp Source 03/06/18 1351 Oral     SpO2 03/06/18 1351 97 %     Weight 03/06/18 1352 163 lb (73.9 kg)     Height 03/06/18 1352 5\' 2"  (1.575 m)     Head Circumference --      Peak Flow --      Pain Score 03/06/18 1352 0     Pain Loc --      Pain Edu? --      Excl. in [place]? --    Constitutional: Alert and oriented. Well appearing and in no acute distress. Eyes: Conjunctivae are normal.  Head: Atraumatic. Nose: No congestion/rhinnorhea. Mouth/Throat: Mucous membranes are moist.  Oropharynx non-erythematous. Neck: No stridor.   Hematological/Lymphatic/Immunilogical: No cervical lymphadenopathy. Cardiovascular: Normal rate, regular rhythm. Grossly normal heart sounds.  Good peripheral circulation. Respiratory: Normal respiratory effort.  No retractions. Lungs primarily clear with a very rare expiratory wheeze heard at the bases which clears with  cough.  Patient is able to speak in complete sentences without any respiratory distress. Musculoskeletal: Moves upper and lower extremities without any difficulty.  Normal gait was noted. Neurologic:  Normal speech and language. No gross focal neurologic deficits are appreciated. No gait instability. Skin:  Skin is warm, dry and intact. No rash noted. Psychiatric: Mood and affect are normal. Speech and behavior are normal.  ____________________________________________   LABS (all labs ordered are listed, but only abnormal results are displayed)  Labs Reviewed - No data to display  RADIOLOGY  Official radiology report(s): Dg Chest 2 View  Result Date: 03/06/2018 CLINICAL DATA:  Hemoptysis EXAM: CHEST - 2 VIEW COMPARISON:  10/23/2017 FINDINGS: Cardiac enlargement  with vascular congestion Asymmetric mild airspace disease on the left was not present previously. Negative for pleural effusion. Right lung clear. IMPRESSION: Asymmetric airspace disease on the left. Given the history of hemoptysis, this could represent blood. Asymmetric edema or pneumonia also possible Cardiac enlargement with vascular congestion. Electronically Signed   By: Franchot Gallo M.D.   On: 03/06/2018 15:40    ____________________________________________   PROCEDURES  Procedure(s) performed: None  Procedures  Critical Care performed: No  ____________________________________________   INITIAL IMPRESSION / ASSESSMENT AND PLAN / ED COURSE  As part of my medical decision making, I reviewed the following data within the electronic MEDICAL RECORD NUMBER Notes from prior ED visits and [place] Controlled Substance Database  Patient was given a DuoNeb treatment and initially did not understand what she was to do with that.  After coaching patient had a second DuoNeb treatment and was able to cough up phlegm and cough was improved.  With her acute illness of 2 to 3 days chest x-ray is more consistent with pneumonia.  Patient was  given a prescription for albuterol inhaler, Tessalon Perles every 8 hours as needed for cough and doxycycline for the next 10 days.  Patient was given a Rocephin injection prior to discharge as she states her penicillin allergy resulted in muscle aches only but no rash or anaphylaxis.  Patient was encouraged to follow-up with her PCP next week if any continued problems and return to the emergency department if any urgent concerns.  ____________________________________________   FINAL CLINICAL IMPRESSION(S) / ED DIAGNOSES  Final diagnoses:  Pneumonia of left lower lobe due to infectious organism [facility])     ED Discharge Orders        Ordered    albuterol (PROVENTIL HFA;VENTOLIN HFA) 108 (90 Base) MCG/ACT inhaler  Every 6 hours PRN     03/06/18 1621    benzonatate (TESSALON PERLES) 100 MG capsule  3 times daily PRN     03/06/18 1621    doxycycline (VIBRAMYCIN) 100 MG capsule  2 times daily     03/06/18 1643       Note:  This document was prepared using Dragon voice recognition software and may include unintentional dictation errors.    Johnn Hai, PA-C 03/06/18 1818    Schuyler Amor, MD 03/17/18 1540

## 2018-03-06 NOTE — ED Notes (Signed)
See triage note  Presents with cough since Saturday  Cough is occasionally prod  But no cough noted at present  Afebrile on arrival

## 2018-03-09 ENCOUNTER — Ambulatory Visit (INDEPENDENT_AMBULATORY_CARE_PROVIDER_SITE_OTHER): Payer: Medicare Other | Admitting: Family Medicine

## 2018-03-09 ENCOUNTER — Encounter: Payer: Self-pay | Admitting: Family Medicine

## 2018-03-09 VITALS — BP 128/80 | HR 67 | Temp 97.6°F | Ht 62.0 in | Wt 173.0 lb

## 2018-03-09 DIAGNOSIS — J189 Pneumonia, unspecified organism: Secondary | ICD-10-CM

## 2018-03-09 DIAGNOSIS — D500 Iron deficiency anemia secondary to blood loss (chronic): Secondary | ICD-10-CM

## 2018-03-09 DIAGNOSIS — I10 Essential (primary) hypertension: Secondary | ICD-10-CM

## 2018-03-09 NOTE — Assessment & Plan Note (Signed)
Discuss hold iron while on doxycycline

## 2018-03-09 NOTE — Assessment & Plan Note (Signed)
The current medical regimen is effective;  continue present plan and medications.  

## 2018-03-09 NOTE — Progress Notes (Signed)
BP 128/80   Pulse 67   Temp 97.6 F (36.4 C) (Oral)   Ht 5\' 2"  (1.575 m)   Wt 173 lb (78.5 kg)   SpO2 98%   BMI 31.64 kg/m    Subjective:    Patient ID: Kristina Henson, female    DOB: 1956/04/14, 62 y.o.   MRN: [id number]  HPI: Kristina Henson is a 62 y.o. female  Chief Complaint  Patient presents with  . Pneumonia    pt states she went to the ER Monday and was diagnosed with pneumonia, just here to f/up. Wants to know if it is OK to take her iron while taking the doxycycline   Patient follow-up from emergency room for pneumonia.  Taking doxycycline and Tessalon Perles with some relief.  No fever chills still coughing a great deal but is now productive using albuterol inhaler also. Blood pressure doing well and is remained well taking medications without problems.  Relevant past medical, surgical, family and social history reviewed and updated as indicated. Interim medical history since our last visit reviewed. Allergies and medications reviewed and updated.  Review of Systems  Constitutional: Positive for diaphoresis and fatigue. Negative for chills and fever.  Respiratory: Positive for cough.   Cardiovascular: Negative.     Per HPI unless specifically indicated above     Objective:    BP 128/80   Pulse 67   Temp 97.6 F (36.4 C) (Oral)   Ht 5\' 2"  (1.575 m)   Wt 173 lb (78.5 kg)   SpO2 98%   BMI 31.64 kg/m   Wt Readings from Last 3 Encounters:  03/09/18 173 lb (78.5 kg)  03/06/18 163 lb (73.9 kg)  02/10/18 173 lb 6.4 oz (78.7 kg)    Physical Exam  Constitutional: She is oriented to person, place, and time. She appears well-developed and well-nourished.  HENT:  Head: Normocephalic and atraumatic.  Eyes: Conjunctivae and EOM are normal.  Neck: Normal range of motion.  Cardiovascular: Normal rate, regular rhythm and normal heart sounds.  Pulmonary/Chest: Effort normal and breath sounds normal.  Musculoskeletal: Normal range of motion.  Neurological: She  is alert and oriented to person, place, and time.  Skin: No erythema.  Psychiatric: She has a normal mood and affect. Her behavior is normal. Judgment and thought content normal.    Results for orders placed or performed in visit on 02/10/18  Microscopic Examination  Result Value Ref Range   WBC, UA 0-5 0 - 5 /hpf   RBC, UA 0-2 0 - 2 /hpf   Epithelial Cells (non renal) 0-10 0 - 10 /hpf   Renal Epithel, UA 0-10 (A) None seen /hpf   Bacteria, UA Few None seen/Few  Uric acid  Result Value Ref Range   Uric Acid 7.0 2.5 - 7.1 mg/dL  UA/M w/rflx Culture, Routine  Result Value Ref Range   Specific Gravity, UA 1.015 1.005 - 1.030   pH, UA 5.0 5.0 - 7.5   Color, UA Yellow Yellow   Appearance Ur Clear Clear   Leukocytes, UA 1+ (A) Negative   Protein, UA Negative Negative/Trace   Glucose, UA Negative Negative   Ketones, UA Negative Negative   RBC, UA Negative Negative   Bilirubin, UA Negative Negative   Urobilinogen, Ur 0.2 0.2 - 1.0 mg/dL   Nitrite, UA Negative Negative   Microscopic Examination See below:       Assessment & Plan:   Problem List Items Addressed This Visit  Cardiovascular and Mediastinum   Hypertension    The current medical regimen is effective;  continue present plan and medications.         Other   Iron deficiency anemia due to chronic blood loss    Discuss hold iron while on doxycycline       Other Visit Diagnoses    Pneumonia of left lung due to infectious organism, unspecified part of lung    -  Primary    Discussed pneumonia patient will continue antibiotics.  Follow up plan: Return if symptoms worsen or fail to improve, for As scheduled.

## 2018-04-03 ENCOUNTER — Encounter: Payer: Self-pay | Admitting: Family Medicine

## 2018-04-03 ENCOUNTER — Other Ambulatory Visit: Payer: Self-pay

## 2018-04-03 ENCOUNTER — Ambulatory Visit (INDEPENDENT_AMBULATORY_CARE_PROVIDER_SITE_OTHER): Payer: Medicare Other | Admitting: Family Medicine

## 2018-04-03 VITALS — BP 144/83 | HR 62 | Temp 98.5°F | Ht 62.0 in | Wt 172.6 lb

## 2018-04-03 DIAGNOSIS — D500 Iron deficiency anemia secondary to blood loss (chronic): Secondary | ICD-10-CM

## 2018-04-03 DIAGNOSIS — I129 Hypertensive chronic kidney disease with stage 1 through stage 4 chronic kidney disease, or unspecified chronic kidney disease: Secondary | ICD-10-CM

## 2018-04-03 DIAGNOSIS — E782 Mixed hyperlipidemia: Secondary | ICD-10-CM | POA: Diagnosis not present

## 2018-04-03 DIAGNOSIS — I482 Chronic atrial fibrillation, unspecified: Secondary | ICD-10-CM

## 2018-04-03 DIAGNOSIS — I700 Atherosclerosis of aorta: Secondary | ICD-10-CM

## 2018-04-03 MED ORDER — HYDRALAZINE HCL 50 MG PO TABS: 50.0000 mg | ORAL_TABLET | Freq: Two times a day (BID) | ORAL | 1 refills | Status: DC

## 2018-04-03 MED ORDER — AMIODARONE HCL 200 MG PO TABS: 200.0000 mg | ORAL_TABLET | ORAL | 0 refills | Status: AC

## 2018-04-03 MED ORDER — LISINOPRIL-HYDROCHLOROTHIAZIDE 20-25 MG PO TABS: 1.0000 | ORAL_TABLET | Freq: Two times a day (BID) | ORAL | 1 refills | Status: DC

## 2018-04-03 MED ORDER — AMLODIPINE BESYLATE 5 MG PO TABS: 5.0000 mg | ORAL_TABLET | Freq: Every day | ORAL | 1 refills | Status: DC

## 2018-04-03 MED ORDER — FERROUS SULFATE 325 (65 FE) MG PO TBEC: 325.0000 mg | DELAYED_RELEASE_TABLET | Freq: Three times a day (TID) | ORAL | 1 refills | Status: DC

## 2018-04-03 NOTE — Assessment & Plan Note (Signed)
Continue to monitor. Call with any concerns. Continue to monitor. Refills given.

## 2018-04-03 NOTE — Assessment & Plan Note (Signed)
Has not taken her amiodarone since yesterday- will give her refill until she can get in touch with cardiology. Call with any concerns.

## 2018-04-03 NOTE — Progress Notes (Signed)
BP (!) 144/83   Pulse 62   Temp 98.5 F (36.9 C) (Oral)   Ht 5\' 2"  (1.575 m)   Wt 172 lb 9.6 oz (78.3 kg)   SpO2 99%   BMI 31.57 kg/m    Subjective:    Patient ID: Kristina Henson, female    DOB: March 03, 1956, 62 y.o.   MRN: [id number]  HPI: Kristina Henson is a 62 y.o. female  Chief Complaint  Patient presents with  . Follow-up    22m   HYPERTENSION / HYPERLIPIDEMIA Satisfied with current treatment? yes Duration of hypertension: chronic BP monitoring frequency: not checking BP medication side effects: no Past BP meds: lisnopril-hctz, hydralizine, amlodipine, amiodarone Duration of hyperlipidemia: chronic Cholesterol medication side effects: no Cholesterol supplements: none Past cholesterol medications:  Medication compliance: excellent compliance Aspirin: yes Recent stressors: no Recurrent headaches: no Visual changes: no Palpitations: no Dyspnea: no Chest pain: no Lower extremity edema: no Dizzy/lightheaded: no  ANEMIA Anemia status: controlled Etiology of anemia: iron deficency Duration of anemia treatment: chonic Compliance with treatment: excellent compliance Iron supplementation side effects: no Severity of anemia: mild Fatigue: no Decreased exercise tolerance: no  Dyspnea on exertion: no Palpitations: no Bleeding: no Pica: no  Relevant past medical, surgical, family and social history reviewed and updated as indicated. Interim medical history since our last visit reviewed. Allergies and medications reviewed and updated.  Review of Systems  Constitutional: Negative.   Respiratory: Negative.   Cardiovascular: Negative.   Neurological: Negative.   Psychiatric/Behavioral: Negative.     Per HPI unless specifically indicated above     Objective:    BP (!) 144/83   Pulse 62   Temp 98.5 F (36.9 C) (Oral)   Ht 5\' 2"  (1.575 m)   Wt 172 lb 9.6 oz (78.3 kg)   SpO2 99%   BMI 31.57 kg/m   Wt Readings from Last 3 Encounters:  04/03/18 172 lb  9.6 oz (78.3 kg)  03/09/18 173 lb (78.5 kg)  03/06/18 163 lb (73.9 kg)    Physical Exam  Constitutional: She is oriented to person, place, and time. She appears well-developed and well-nourished. No distress.  HENT:  Head: Normocephalic and atraumatic.  Right Ear: Hearing normal.  Left Ear: Hearing normal.  Nose: Nose normal.  Eyes: Conjunctivae and lids are normal. Right eye exhibits no discharge. Left eye exhibits no discharge. No scleral icterus.  Cardiovascular: Normal rate, regular rhythm, normal heart sounds and intact distal pulses. Exam reveals no gallop and no friction rub.  No murmur heard. Pulmonary/Chest: Effort normal and breath sounds normal. No stridor. No respiratory distress. She has no wheezes. She has no rales. She exhibits no tenderness.  Musculoskeletal: Normal range of motion.  Neurological: She is alert and oriented to person, place, and time.  Skin: Skin is warm, dry and intact. Capillary refill takes less than 2 seconds. No rash noted. She is not diaphoretic. No erythema. No pallor.  Psychiatric: She has a normal mood and affect. Her speech is normal and behavior is normal. Judgment and thought content normal. Cognition and memory are normal.  Nursing note and vitals reviewed.   Results for orders placed or performed in visit on 02/10/18  Microscopic Examination  Result Value Ref Range   WBC, UA 0-5 0 - 5 /hpf   RBC, UA 0-2 0 - 2 /hpf   Epithelial Cells (non renal) 0-10 0 - 10 /hpf   Renal Epithel, UA 0-10 (A) None seen /hpf   Bacteria, UA  Few None seen/Few  Uric acid  Result Value Ref Range   Uric Acid 7.0 2.5 - 7.1 mg/dL  UA/M w/rflx Culture, Routine  Result Value Ref Range   Specific Gravity, UA 1.015 1.005 - 1.030   pH, UA 5.0 5.0 - 7.5   Color, UA Yellow Yellow   Appearance Ur Clear Clear   Leukocytes, UA 1+ (A) Negative   Protein, UA Negative Negative/Trace   Glucose, UA Negative Negative   Ketones, UA Negative Negative   RBC, UA Negative  Negative   Bilirubin, UA Negative Negative   Urobilinogen, Ur 0.2 0.2 - 1.0 mg/dL   Nitrite, UA Negative Negative   Microscopic Examination See below:       Assessment & Plan:   Problem List Items Addressed This Visit      Cardiovascular and Mediastinum   Atrial fibrillation ([place])    Has not taken her amiodarone since yesterday- will give her refill until she can get in touch with cardiology. Call with any concerns.       Relevant Medications   amiodarone (PACERONE) 200 MG tablet   lisinopril-hydrochlorothiazide (PRINZIDE,ZESTORETIC) 20-25 MG tablet   hydrALAZINE (APRESOLINE) 50 MG tablet   amLODipine (NORVASC) 5 MG tablet   Atherosclerosis of aorta (HCC)    Will keep BP and cholesterol under good control. Continue to monitor. Call with any concerns.       Relevant Medications   amiodarone (PACERONE) 200 MG tablet   lisinopril-hydrochlorothiazide (PRINZIDE,ZESTORETIC) 20-25 MG tablet   hydrALAZINE (APRESOLINE) 50 MG tablet   amLODipine (NORVASC) 5 MG tablet     Genitourinary   Benign hypertensive renal disease - Primary    Slightly elevated today, likely as she has not taken her amiodarone since yesterday. Will get her refill until she gets in to see Dr. Ubaldo Glassing and recheck at follow up. Call with any concerns.       Relevant Orders   Comprehensive metabolic panel     Other   HLD (hyperlipidemia)    Continue to monitor. Call with any concerns. Continue to monitor. Refills given.       Relevant Medications   amiodarone (PACERONE) 200 MG tablet   lisinopril-hydrochlorothiazide (PRINZIDE,ZESTORETIC) 20-25 MG tablet   hydrALAZINE (APRESOLINE) 50 MG tablet   amLODipine (NORVASC) 5 MG tablet   Other Relevant Orders   Comprehensive metabolic panel   Lipid Panel w/o Chol/HDL Ratio   Iron deficiency anemia due to chronic blood loss    Rechecking levels today. Call with any concerns. Continue to monitor. Refills given today.      Relevant Medications   ferrous sulfate 325  (65 FE) MG EC tablet   Other Relevant Orders   CBC with Differential/Platelet   Iron and TIBC   Ferritin       Follow up plan: Return in about 6 months (around 10/04/2018) for Physical.

## 2018-04-03 NOTE — Assessment & Plan Note (Signed)
Rechecking levels today. Call with any concerns. Continue to monitor. Refills given today.  

## 2018-04-03 NOTE — Assessment & Plan Note (Signed)
Will keep BP and cholesterol under good control. Continue to monitor. Call with any concerns.  

## 2018-04-03 NOTE — Assessment & Plan Note (Signed)
Slightly elevated today, likely as she has not taken her amiodarone since yesterday. Will get her refill until she gets in to see Dr. Ubaldo Glassing and recheck at follow up. Call with any concerns.

## 2018-04-04 ENCOUNTER — Encounter: Payer: Self-pay | Admitting: Family Medicine

## 2018-04-04 LAB — COMPREHENSIVE METABOLIC PANEL
ALT: 23 IU/L (ref 0–32)
AST: 18 IU/L (ref 0–40)
Albumin/Globulin Ratio: 1.3 (ref 1.2–2.2)
Albumin: 3.9 g/dL (ref 3.6–4.8)
Alkaline Phosphatase: 87 IU/L (ref 39–117)
BUN/Creatinine Ratio: 24 (ref 12–28)
BUN: 25 mg/dL (ref 8–27)
Bilirubin Total: 0.4 mg/dL (ref 0.0–1.2)
CALCIUM: 9.8 mg/dL (ref 8.7–10.3)
CO2: 24 mmol/L (ref 20–29)
CREATININE: 1.03 mg/dL — AB (ref 0.57–1.00)
Chloride: 101 mmol/L (ref 96–106)
GFR calc Af Amer: 68 mL/min/{1.73_m2} (ref >59)
GFR, EST NON AFRICAN AMERICAN: 59 mL/min/{1.73_m2} — AB (ref >59)
Globulin, Total: 3.1 g/dL (ref 1.5–4.5)
Glucose: 102 mg/dL — ABNORMAL HIGH (ref 65–99)
Potassium: 3.8 mmol/L (ref 3.5–5.2)
Sodium: 142 mmol/L (ref 134–144)
Total Protein: 7 g/dL (ref 6.0–8.5)

## 2018-04-04 LAB — CBC WITH DIFFERENTIAL/PLATELET
BASOS ABS: 0 10*3/uL (ref 0.0–0.2)
Basos: 0 %
EOS (ABSOLUTE): 0 10*3/uL (ref 0.0–0.4)
EOS: 1 %
HEMATOCRIT: 32 % — AB (ref 34.0–46.6)
Hemoglobin: 10.4 g/dL — ABNORMAL LOW (ref 11.1–15.9)
Immature Grans (Abs): 0 10*3/uL (ref 0.0–0.1)
Immature Granulocytes: 0 %
LYMPHS ABS: 0.5 10*3/uL — AB (ref 0.7–3.1)
Lymphs: 20 %
MCH: 26.5 pg — ABNORMAL LOW (ref 26.6–33.0)
MCHC: 32.5 g/dL (ref 31.5–35.7)
MCV: 81 fL (ref 79–97)
Monocytes Absolute: 0.2 10*3/uL (ref 0.1–0.9)
Monocytes: 7 %
Neutrophils Absolute: 1.6 10*3/uL (ref 1.4–7.0)
Neutrophils: 72 %
Platelets: 144 10*3/uL — ABNORMAL LOW (ref 150–450)
RBC: 3.93 x10E6/uL (ref 3.77–5.28)
RDW: 16.8 % — AB (ref 12.3–15.4)
WBC: 2.3 10*3/uL — AB (ref 3.4–10.8)

## 2018-04-04 LAB — LIPID PANEL W/O CHOL/HDL RATIO
Cholesterol, Total: 174 mg/dL (ref 100–199)
HDL: 40 mg/dL (ref >39)
LDL CALC: 103 mg/dL — AB (ref 0–99)
TRIGLYCERIDES: 156 mg/dL — AB (ref 0–149)
VLDL Cholesterol Cal: 31 mg/dL (ref 5–40)

## 2018-04-04 LAB — IRON AND TIBC
Iron Saturation: 11 % — ABNORMAL LOW (ref 15–55)
Iron: 27 ug/dL (ref 27–159)
Total Iron Binding Capacity: 250 ug/dL (ref 250–450)
UIBC: 223 ug/dL (ref 131–425)

## 2018-04-04 LAB — FERRITIN: Ferritin: 188 ng/mL — ABNORMAL HIGH (ref 15–150)

## 2018-04-14 DIAGNOSIS — I10 Essential (primary) hypertension: Secondary | ICD-10-CM | POA: Diagnosis not present

## 2018-04-14 DIAGNOSIS — I480 Paroxysmal atrial fibrillation: Secondary | ICD-10-CM | POA: Diagnosis not present

## 2018-04-14 DIAGNOSIS — Z79899 Other long term (current) drug therapy: Secondary | ICD-10-CM | POA: Diagnosis not present

## 2018-04-14 DIAGNOSIS — E7801 Familial hypercholesterolemia: Secondary | ICD-10-CM | POA: Diagnosis not present

## 2018-05-08 ENCOUNTER — Ambulatory Visit: Payer: Medicare Other | Admitting: Physician Assistant

## 2018-05-11 ENCOUNTER — Other Ambulatory Visit: Payer: Self-pay

## 2018-05-11 ENCOUNTER — Ambulatory Visit (INDEPENDENT_AMBULATORY_CARE_PROVIDER_SITE_OTHER): Payer: Medicare Other | Admitting: Physician Assistant

## 2018-05-11 ENCOUNTER — Encounter: Payer: Self-pay | Admitting: Physician Assistant

## 2018-05-11 VITALS — BP 102/65 | HR 72 | Temp 97.8°F | Ht 62.0 in | Wt 169.5 lb

## 2018-05-11 DIAGNOSIS — N3090 Cystitis, unspecified without hematuria: Secondary | ICD-10-CM

## 2018-05-11 DIAGNOSIS — R8290 Unspecified abnormal findings in urine: Secondary | ICD-10-CM | POA: Diagnosis not present

## 2018-05-11 LAB — UA/M W/RFLX CULTURE, ROUTINE
Bilirubin, UA: NEGATIVE
Glucose, UA: NEGATIVE
Ketones, UA: NEGATIVE
Nitrite, UA: NEGATIVE
RBC, UA: NEGATIVE
Specific Gravity, UA: 1.015 (ref 1.005–1.030)
Urobilinogen, Ur: 0.2 mg/dL (ref 0.2–1.0)
pH, UA: 5 (ref 5.0–7.5)

## 2018-05-11 LAB — MICROSCOPIC EXAMINATION: WBC, UA: >30 /hpf — AB (ref 0–5)

## 2018-05-11 MED ORDER — SULFAMETHOXAZOLE-TRIMETHOPRIM 800-160 MG PO TABS: 1.0000 | ORAL_TABLET | Freq: Two times a day (BID) | ORAL | 0 refills | Status: AC

## 2018-05-11 NOTE — Progress Notes (Signed)
Subjective:    Patient ID: MIRZA KIDNEY, female    DOB: January 17, 1956, 62 y.o.   MRN: [id number]  Kristina Henson is a 62 y.o. female presenting on 05/11/2018 for Back Pain (lower back); Urinary Frequency (pt states she has been having a cloudy, foul smelling urine x 4 days); and Cough (x 2 weeks)   HPI  Cloudy urine x 1 week. No dysuria, endorses urinary frequency. No fevers, no chills, no abdominal pain, some back pain in lower back.  Social History   Tobacco Use  . Smoking status: Former Smoker    Last attempt to quit: 1978    Years since quitting: 41.6  . Smokeless tobacco: Never Used  Substance Use Topics  . Alcohol use: Not Currently    Frequency: Never  . Drug use: No    Review of Systems Per HPI unless specifically indicated above     Objective:    BP 102/65   Pulse 72   Temp 97.8 F (36.6 C) (Oral)   Ht 5\' 2"  (1.575 m)   Wt 169 lb 8 oz (76.9 kg)   SpO2 98%   BMI 31.00 kg/m   Wt Readings from Last 3 Encounters:  05/11/18 169 lb 8 oz (76.9 kg)  04/03/18 172 lb 9.6 oz (78.3 kg)  03/09/18 173 lb (78.5 kg)    Physical Exam  Constitutional: She is oriented to person, place, and time. She appears well-developed and well-nourished. No distress.  Cardiovascular: Normal rate and regular rhythm.  Pulmonary/Chest: Effort normal and breath sounds normal.  Abdominal: Soft. Bowel sounds are normal. She exhibits no distension. There is tenderness in the suprapubic area. There is no rebound, no guarding and no CVA tenderness.  Neurological: She is alert and oriented to person, place, and time.  Skin: Skin is warm and dry. She is not diaphoretic.  Psychiatric: She has a normal mood and affect. Her behavior is normal.   Results for orders placed or performed in visit on 04/03/18  Comprehensive metabolic panel  Result Value Ref Range   Glucose 102 (H) 65 - 99 mg/dL   BUN 25 8 - 27 mg/dL   Creatinine, Ser 1.03 (H) 0.57 - 1.00 mg/dL   GFR calc non Af Amer 59 (L) >59  mL/min/1.73   GFR calc Af Amer 68 >59 mL/min/1.73   BUN/Creatinine Ratio 24 12 - 28   Sodium 142 134 - 144 mmol/L   Potassium 3.8 3.5 - 5.2 mmol/L   Chloride 101 96 - 106 mmol/L   CO2 24 20 - 29 mmol/L   Calcium 9.8 8.7 - 10.3 mg/dL   Total Protein 7.0 6.0 - 8.5 g/dL   Albumin 3.9 3.6 - 4.8 g/dL   Globulin, Total 3.1 1.5 - 4.5 g/dL   Albumin/Globulin Ratio 1.3 1.2 - 2.2   Bilirubin Total 0.4 0.0 - 1.2 mg/dL   Alkaline Phosphatase 87 39 - 117 IU/L   AST 18 0 - 40 IU/L   ALT 23 0 - 32 IU/L  Lipid Panel w/o Chol/HDL Ratio  Result Value Ref Range   Cholesterol, Total 174 100 - 199 mg/dL   Triglycerides 156 (H) 0 - 149 mg/dL   HDL 40 >39 mg/dL   VLDL Cholesterol Cal 31 5 - 40 mg/dL   LDL Calculated 103 (H) 0 - 99 mg/dL  CBC with Differential/Platelet  Result Value Ref Range   WBC 2.3 (LL) 3.4 - 10.8 x10E3/uL   RBC 3.93 3.77 - 5.28 x10E6/uL   Hemoglobin  10.4 (L) 11.1 - 15.9 g/dL   Hematocrit 32.0 (L) 34.0 - 46.6 %   MCV 81 79 - 97 fL   MCH 26.5 (L) 26.6 - 33.0 pg   MCHC 32.5 31.5 - 35.7 g/dL   RDW 16.8 (H) 12.3 - 15.4 %   Platelets 144 (L) 150 - 450 x10E3/uL   Neutrophils 72 Not Estab. %   Lymphs 20 Not Estab. %   Monocytes 7 Not Estab. %   Eos 1 Not Estab. %   Basos 0 Not Estab. %   Neutrophils Absolute 1.6 1.4 - 7.0 x10E3/uL   Lymphocytes Absolute 0.5 (L) 0.7 - 3.1 x10E3/uL   Monocytes Absolute 0.2 0.1 - 0.9 x10E3/uL   EOS (ABSOLUTE) 0.0 0.0 - 0.4 x10E3/uL   Basophils Absolute 0.0 0.0 - 0.2 x10E3/uL   Immature Granulocytes 0 Not Estab. %   Immature Grans (Abs) 0.0 0.0 - 0.1 x10E3/uL  Iron and TIBC  Result Value Ref Range   Total Iron Binding Capacity 250 250 - 450 ug/dL   UIBC 223 131 - 425 ug/dL   Iron 27 27 - 159 ug/dL   Iron Saturation 11 (L) 15 - 55 %  Ferritin  Result Value Ref Range   Ferritin 188 (H) 15 - 150 ng/mL      Assessment & Plan:  1. Foul smelling urine  UA with leukocytes. Will culture. Prescribe as below due to going into the weekend.   -  UA/M w/rflx Culture, Routine - Urine Culture  2. Cystitis  - sulfamethoxazole-trimethoprim (BACTRIM DS,SEPTRA DS) 800-160 MG tablet; Take 1 tablet by mouth 2 (two) times daily for 3 days.  Dispense: 6 tablet; Refill: 0    Follow up plan: Return if symptoms worsen or fail to improve.  Carles Collet, PA-C [facility] 05/11/2018, 4:16 PM

## 2018-05-11 NOTE — Patient Instructions (Signed)

## 2018-05-13 LAB — URINE CULTURE

## 2018-05-15 NOTE — Progress Notes (Signed)
Urine culture positive for E. Coli sensitive to Bactrim. If she hasn't taken medication, please do so no.

## 2018-06-08 ENCOUNTER — Telehealth: Payer: Self-pay

## 2018-06-08 NOTE — Telephone Encounter (Signed)
Copied from [place] [id number]. Topic: Medicare AWV >> Jun 08, 2018 10:40 AM Tyler Aas A, LPN wrote: Reason for CRM: called to cancel medicare wellness visit. Dr.Johnson will perform awv and cpe on same day- 10/16/2018.   If patient calls back to confirm please cancel only awv 10/04/2018.

## 2018-06-09 ENCOUNTER — Ambulatory Visit (INDEPENDENT_AMBULATORY_CARE_PROVIDER_SITE_OTHER): Payer: Medicare Other

## 2018-06-09 DIAGNOSIS — Z23 Encounter for immunization: Secondary | ICD-10-CM | POA: Diagnosis not present

## 2018-07-24 ENCOUNTER — Other Ambulatory Visit: Payer: Self-pay

## 2018-07-24 ENCOUNTER — Inpatient Hospital Stay (HOSPITAL_BASED_OUTPATIENT_CLINIC_OR_DEPARTMENT_OTHER): Payer: Medicare Other | Admitting: Internal Medicine

## 2018-07-24 ENCOUNTER — Encounter: Payer: Self-pay | Admitting: Internal Medicine

## 2018-07-24 ENCOUNTER — Inpatient Hospital Stay: Payer: Medicare Other | Attending: Internal Medicine

## 2018-07-24 VITALS — BP 137/78 | HR 53 | Temp 98.3°F | Resp 16 | Wt 172.0 lb

## 2018-07-24 DIAGNOSIS — Z87891 Personal history of nicotine dependence: Secondary | ICD-10-CM | POA: Diagnosis not present

## 2018-07-24 DIAGNOSIS — D696 Thrombocytopenia, unspecified: Secondary | ICD-10-CM | POA: Diagnosis not present

## 2018-07-24 DIAGNOSIS — D709 Neutropenia, unspecified: Secondary | ICD-10-CM | POA: Insufficient documentation

## 2018-07-24 DIAGNOSIS — R5383 Other fatigue: Secondary | ICD-10-CM | POA: Diagnosis not present

## 2018-07-24 DIAGNOSIS — D649 Anemia, unspecified: Secondary | ICD-10-CM

## 2018-07-24 DIAGNOSIS — D638 Anemia in other chronic diseases classified elsewhere: Secondary | ICD-10-CM

## 2018-07-24 DIAGNOSIS — D500 Iron deficiency anemia secondary to blood loss (chronic): Secondary | ICD-10-CM

## 2018-07-24 LAB — CBC WITH DIFFERENTIAL/PLATELET
Abs Immature Granulocytes: 0.01 10*3/uL (ref 0.00–0.07)
Basophils Absolute: 0 10*3/uL (ref 0.0–0.1)
Basophils Relative: 1 %
EOS ABS: 0 10*3/uL (ref 0.0–0.5)
Eosinophils Relative: 1 %
HEMATOCRIT: 34.6 % — AB (ref 36.0–46.0)
HEMOGLOBIN: 10.5 g/dL — AB (ref 12.0–15.0)
IMMATURE GRANULOCYTES: 0 %
LYMPHS ABS: 0.7 10*3/uL (ref 0.7–4.0)
Lymphocytes Relative: 23 %
MCH: 25.7 pg — ABNORMAL LOW (ref 26.0–34.0)
MCHC: 30.3 g/dL (ref 30.0–36.0)
MCV: 84.8 fL (ref 80.0–100.0)
MONOS PCT: 9 %
Monocytes Absolute: 0.3 10*3/uL (ref 0.1–1.0)
NEUTROS PCT: 66 %
Neutro Abs: 1.9 10*3/uL (ref 1.7–7.7)
Platelets: 136 10*3/uL — ABNORMAL LOW (ref 150–400)
RBC: 4.08 MIL/uL (ref 3.87–5.11)
RDW: 16.8 % — AB (ref 11.5–15.5)
WBC: 3 10*3/uL — ABNORMAL LOW (ref 4.0–10.5)
nRBC: 0 % (ref 0.0–0.2)

## 2018-07-24 NOTE — Assessment & Plan Note (Addendum)
#   Anemia- likely IDA vs anemia of chronic disease  [unclear etiology]- s/p IV iron; improved. Hb ~10.5; on p.o. iron.  Stable.  #Mild asymptomatic leukopenia/intermittent neutropenia-stable.  #Mild intermittent thrombocytopenia-today 136.  Stable.  Clinically suspect patient's cytopenias are secondary to question autoimmune process.   # DISPOSITION:  # follow up in 6 months-MD/cbc/bmp- Dr.B  Cc; Dr.Johnson.

## 2018-07-24 NOTE — Progress Notes (Signed)
[facility] CONSULT NOTE  Patient Care Team: Valerie Roys, DO as PCP - General (Family Medicine) Ubaldo Glassing Javier Docker, MD as Consulting Physician (Cardiology) Carloyn Manner, MD as Referring Physician (Otolaryngology)  CHIEF COMPLAINTS/PURPOSE OF CONSULTATION:   # Anemia of chronic disease/mild intermittent leucopenia/thromboctyopenia? Autoimmune- EGD/ [facility] [Dr.Wohl; 2017] chronic back pain [? 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     HISTORY OF PRESENTING ILLNESS:  Kristina Henson 62 y.o.  female noted Moderate anemia of Unclear etiology- iron deficiency versus chronic disease is here for follow-up.  Patient denies any blood in stools.  Denies any nausea vomiting.  Joint pains back pain improved.  Mild fatigue. She continues to be on p.o. iron.  No constipation or dyspepsia.  Review of Systems  Constitutional: Negative for chills, diaphoresis, fever, malaise/fatigue and weight loss.  HENT: Negative for nosebleeds and sore throat.   Eyes: Negative for double vision.  Respiratory: Negative for cough, hemoptysis, sputum production, shortness of breath and wheezing.   Cardiovascular: Negative for chest pain, palpitations, orthopnea and leg swelling.  Gastrointestinal: Negative for abdominal pain, blood in stool, constipation, diarrhea, heartburn, melena, nausea and vomiting.  Genitourinary: Negative for dysuria, frequency and urgency.  Musculoskeletal: Negative for back pain and joint pain.  Skin: Negative.  Negative for itching and rash.  Neurological: Negative for dizziness, tingling, focal weakness, weakness and headaches.  Endo/Heme/Allergies: Does not bruise/bleed easily.  Psychiatric/Behavioral: Negative for depression. The patient is not  nervous/anxious and does not have insomnia.      MEDICAL HISTORY:  Past Medical History:  Diagnosis Date  . Anemia   . Arthritis    BACK-LUMBAR  . Atrial fibrillation ([place])   . Coronary artery disease   . Dysrhythmia   . Hypertension   . Mass in neck    lt side  . Pneumonia 2016  . Stroke ([place]) 2012  . Stroke [facility]) 2012    SURGICAL HISTORY: Past Surgical History:  Procedure Laterality Date  . ABDOMINAL HYSTERECTOMY     Total  . cardiac catherization    . COLONOSCOPY WITH PROPOFOL N/A 11/18/2015   Procedure: COLONOSCOPY WITH PROPOFOL;  Surgeon: Lucilla Lame, MD;  Location: [facility] ENDOSCOPY;  Service: Endoscopy;  Laterality: N/A;  . CORONARY ANGIOPLASTY    . ESOPHAGOGASTRODUODENOSCOPY (EGD) WITH PROPOFOL N/A 04/23/2016   Procedure: ESOPHAGOGASTRODUODENOSCOPY (EGD) WITH PROPOFOL;  Surgeon: Lucilla Lame, MD;  Location: [facility];  Service: Endoscopy;  Laterality: N/A;  small bowel bx gastric antrum bx  . SUBMANDIBULAR GLAND EXCISION Left 09/08/2017   Procedure: EXCISION SUBMANDIBULAR GLAND;  Surgeon: Carloyn Manner, MD;  Location: [facility] ORS;  Service: ENT;  Laterality: Left;  . throat biopsy      SOCIAL HISTORY: Social History   Socioeconomic History  . Marital status: Divorced    Spouse name: Not on file  . Number of children: Not on file  . Years of education: 20  . Highest education level: 12th grade  Occupational History  . Not on file  Social Needs  . Financial resource strain: Not hard at all  . Food insecurity:    Worry: Never true    Inability: Never true  . Transportation needs:    Medical: No    Non-medical: No  Tobacco Use  . Smoking status: Former Smoker  Last attempt to quit: 1978    Years since quitting: 41.8  . Smokeless tobacco: Never Used  Substance and Sexual Activity  . Alcohol use: Not Currently    Frequency: Never  . Drug use: No  . Sexual activity: Yes  Lifestyle  . Physical activity:    Days per week: 5 days    Minutes  per session: 50 min  . Stress: Not at all  Relationships  . Social connections:    Talks on phone: Once a week    Gets together: More than three times a week    Attends religious service: Never    Active member of club or organization: No    Attends meetings of clubs or organizations: Never    Relationship status: Divorced  . Intimate partner violence:    Fear of current or ex partner: No    Emotionally abused: No    Physically abused: No    Forced sexual activity: No  Other Topics Concern  . Not on file  Social History Narrative  . Not on file    FAMILY HISTORY: Family History  Problem Relation Age of Onset  . Arthritis Mother   . Cancer Mother   . Hypertension Sister   . Asthma Brother   . Dementia Brother   . Breast cancer Neg Hx   . Kidney cancer Neg Hx   . Bladder Cancer Neg Hx     ALLERGIES:  is allergic to amoxicillin.  MEDICATIONS:  Current Outpatient Medications  Medication Sig Dispense Refill  . amiodarone (PACERONE) 200 MG tablet Take 1 tablet (200 mg total) by mouth every morning. 30 tablet 0  . amLODipine (NORVASC) 5 MG tablet Take 1 tablet (5 mg total) by mouth daily. 90 tablet 1  . Ascorbic Acid (VITAMIN C) 100 MG tablet Take 100 mg by mouth daily.    Marland Kitchen aspirin EC 81 MG tablet Take 81 mg by mouth daily.     Marland Kitchen CRANBERRY PO Take 1 tablet by mouth daily.    . ferrous sulfate 325 (65 FE) MG EC tablet Take 1 tablet (325 mg total) by mouth 3 (three) times daily with meals. 270 tablet 1  . hydrALAZINE (APRESOLINE) 50 MG tablet Take 1 tablet (50 mg total) by mouth 2 (two) times daily. 180 tablet 1  . lisinopril-hydrochlorothiazide (PRINZIDE,ZESTORETIC) 20-25 MG tablet Take 1 tablet by mouth 2 (two) times daily. 180 tablet 1  . Multiple Vitamins-Minerals (MULTIVITAMIN WITH MINERALS) tablet Take 1 tablet by mouth daily.     No current facility-administered medications for this visit.       Marland Kitchen  PHYSICAL EXAMINATION: ECOG PERFORMANCE STATUS: 1 - Symptomatic  but completely ambulatory  Vitals:   07/24/18 1413  BP: 137/78  Pulse: (!) 53  Resp: 16  Temp: 98.3 F (36.8 C)   Filed Weights   07/24/18 1413  Weight: 172 lb (78 kg)    Physical Exam  Constitutional: She is oriented to person, place, and time and well-developed, well-nourished, and in no distress.  HENT:  Head: Normocephalic and atraumatic.  Mouth/Throat: Oropharynx is clear and moist. No oropharyngeal exudate.  Eyes: Pupils are equal, round, and reactive to light.  Neck: Normal range of motion. Neck supple.  Cardiovascular: Normal rate and regular rhythm.  Pulmonary/Chest: No respiratory distress. She has no wheezes.  Abdominal: Soft. Bowel sounds are normal. She exhibits no distension and no mass. There is no tenderness. There is no rebound and no guarding.  Musculoskeletal: Normal range of motion.  She exhibits no edema or tenderness.  Neurological: She is alert and oriented to person, place, and time.  Skin: Skin is warm.  Psychiatric: Affect normal.     LABORATORY DATA:  I have reviewed the data as listed Lab Results  Component Value Date   WBC 3.0 (L) 07/24/2018   HGB 10.5 (L) 07/24/2018   HCT 34.6 (L) 07/24/2018   MCV 84.8 07/24/2018   PLT 136 (L) 07/24/2018   Recent Labs    08/30/17 1138 10/03/17 1618 04/03/18 1018  NA  --  142 142  K 3.9 3.8 3.8  CL  --  105 101  CO2  --  22 24  GLUCOSE  --  101* 102*  BUN  --  24 25  CREATININE  --  0.69 1.03*  CALCIUM  --  8.8 9.8  GFRNONAA  --  96 59*  GFRAA  --  110 68  PROT  --  6.8 7.0  ALBUMIN  --  3.9 3.9  AST  --  15 18  ALT  --  16 23  ALKPHOS  --  86 87  BILITOT  --  0.3 0.4    RADIOGRAPHIC STUDIES: I have personally reviewed the radiological images as listed and agreed with the findings in the report. No results found.  ASSESSMENT & PLAN:   Anemia due to chronic illness # Anemia- likely IDA vs anemia of chronic disease  [unclear etiology]- s/p IV iron; improved. Hb ~10.5; on p.o. iron.   Stable.  #Mild asymptomatic leukopenia/intermittent neutropenia-stable.  #Mild intermittent thrombocytopenia-today 136.  Stable.  Clinically suspect patient's cytopenias are secondary to question autoimmune process.   # DISPOSITION:  # follow up in 6 months-MD/cbc/bmp- Dr.B  Cc; Dr.Johnson.   All questions were answered. The patient knows to call the clinic with any problems, questions or concerns.     Cammie Sickle, MD 07/24/2018 5:00 PM

## 2018-07-25 ENCOUNTER — Other Ambulatory Visit: Payer: Self-pay | Admitting: Family Medicine

## 2018-07-25 DIAGNOSIS — Z1231 Encounter for screening mammogram for malignant neoplasm of breast: Secondary | ICD-10-CM

## 2018-09-25 IMAGING — CR DG CHEST 2V
1 series · 2 of 2 positions shown · non-contrast
Comparison: Chest CT 05/02/2016

CLINICAL DATA: Cough

EXAM:
CHEST  2 VIEW

[Series 1: w chest pa · 0.14mm/px · 2 of 2 slices shown]
[im 1/2]
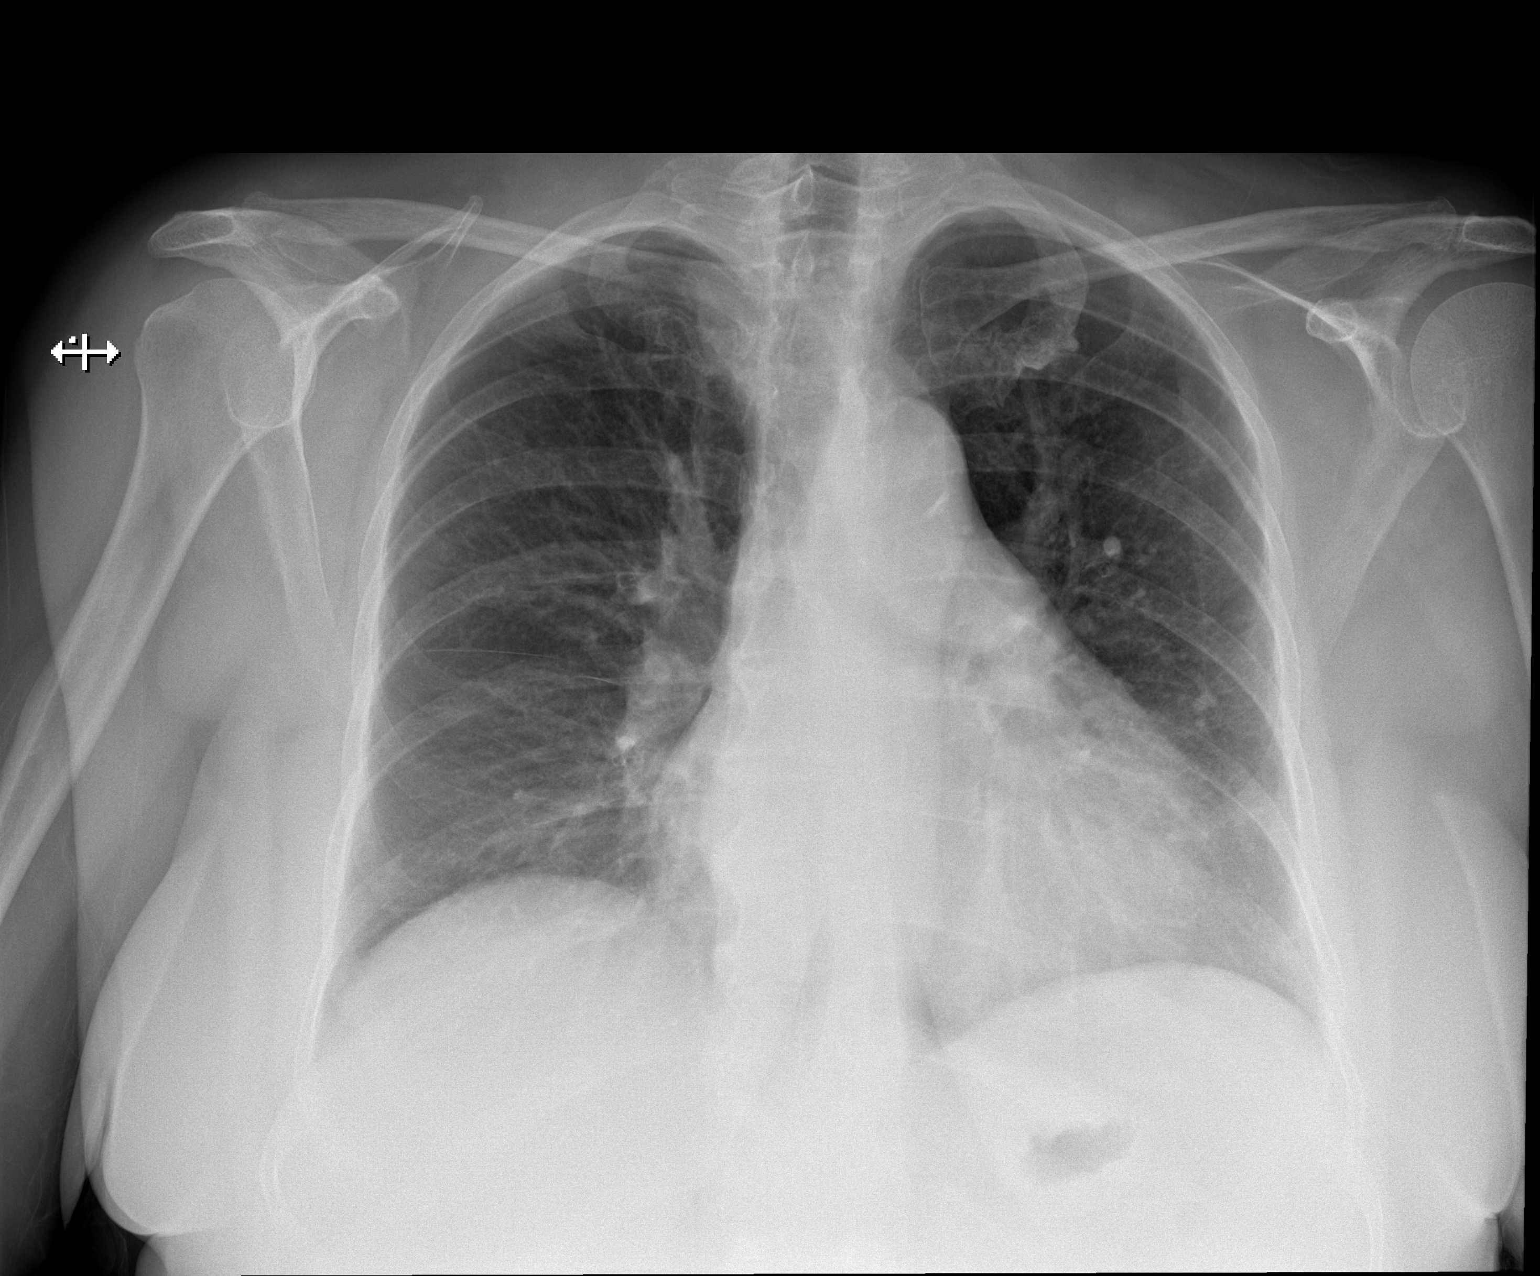
[im 2/2]
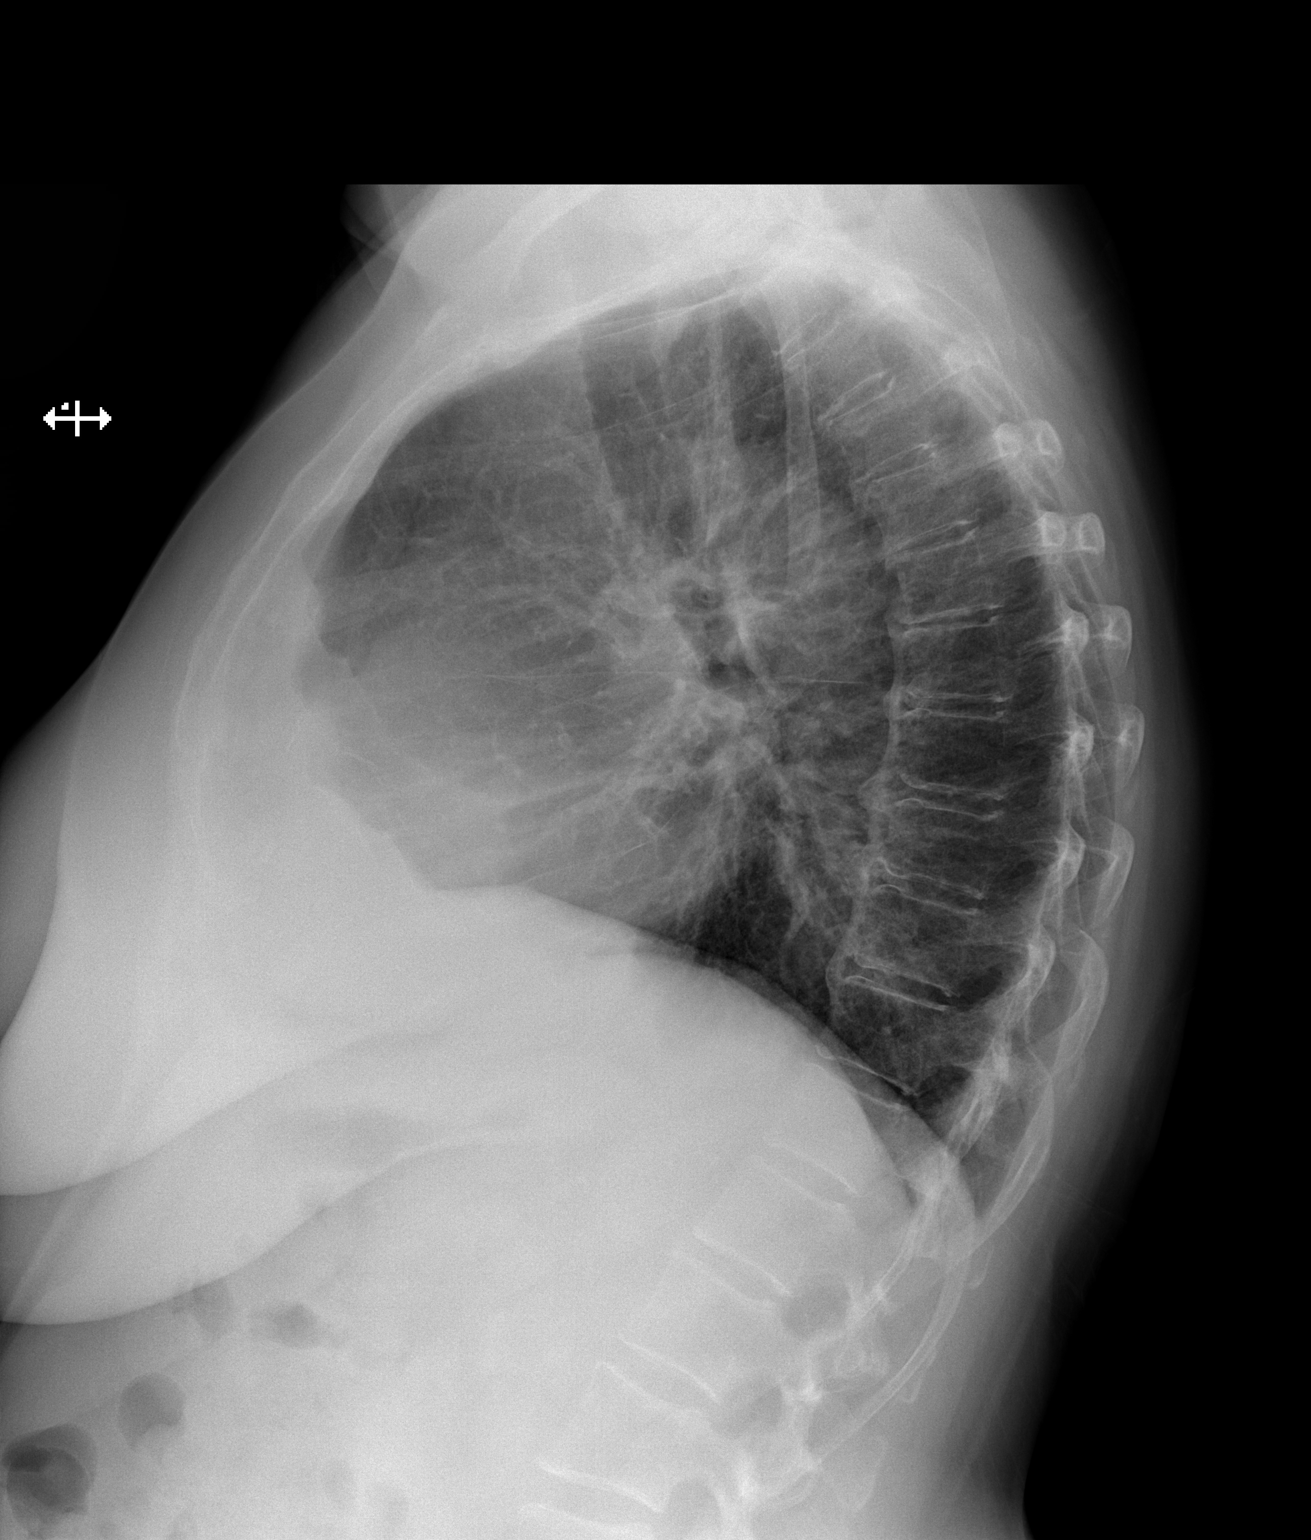

[2 of 2 positions shown; findings below may reference images not displayed]

FINDINGS: Calcified granuloma in the left mid lung. Heart is borderline in
size. Right lung is clear. No effusions or acute bony abnormality.
IMPRESSION: Mild cardiomegaly.  Old granulomatous disease.  No active disease.

## 2018-09-28 ENCOUNTER — Ambulatory Visit
Admission: RE | Admit: 2018-09-28 | Discharge: 2018-09-28 | Disposition: A | Discharge status: Home / Self Care | Payer: Medicare Other | Source: Ambulatory Visit | Attending: Family Medicine | Admitting: Family Medicine

## 2018-09-28 ENCOUNTER — Encounter: Payer: Self-pay | Admitting: Family Medicine

## 2018-09-28 DIAGNOSIS — Z1231 Encounter for screening mammogram for malignant neoplasm of breast: Secondary | ICD-10-CM | POA: Insufficient documentation

## 2018-10-01 IMAGING — CR DG CHEST 2V
2 series · 2 of 2 positions shown · non-contrast
Comparison: 10/17/2017

CLINICAL DATA: Nonproductive cough for 2 weeks.

EXAM:
CHEST  2 VIEW

[chest pa]
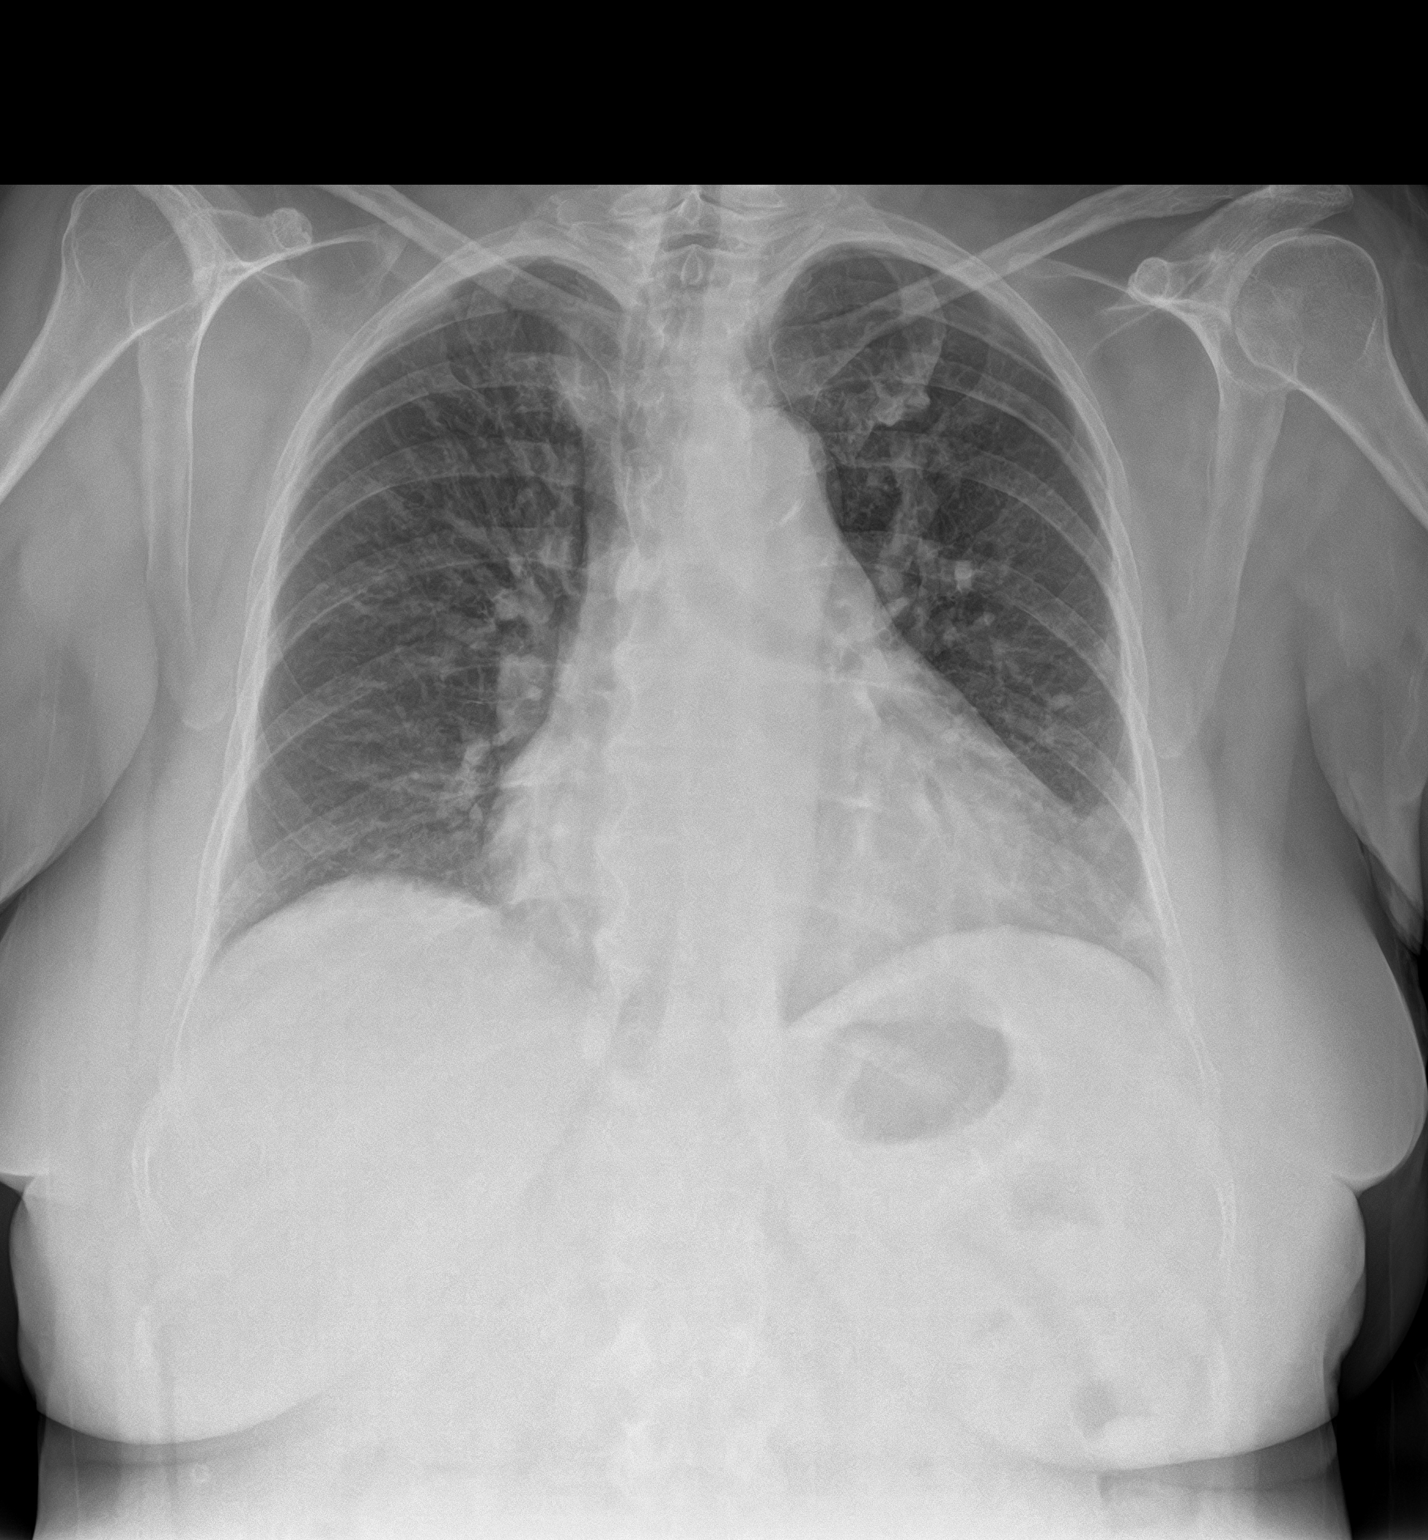

[chest lat]
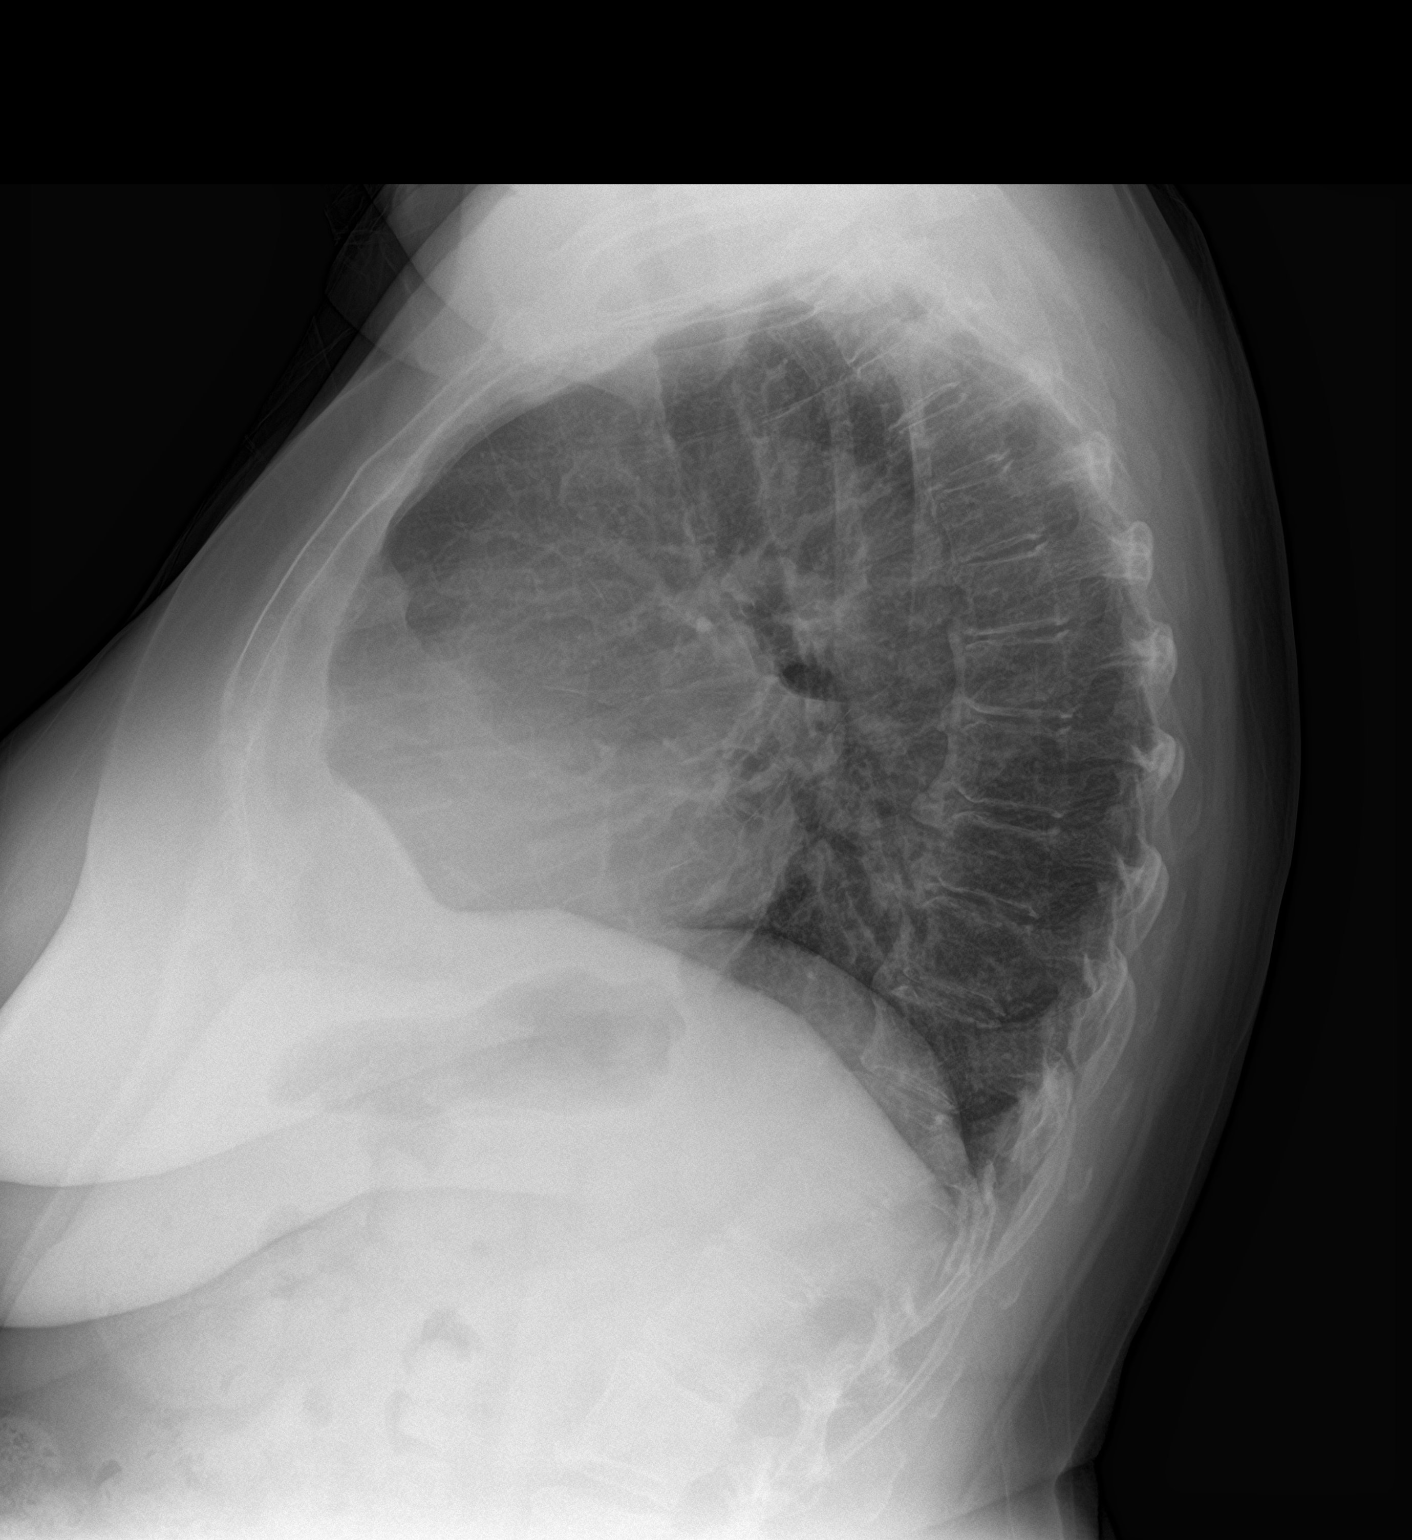

[2 of 2 positions shown; findings below may reference images not displayed]

FINDINGS: Stable mild cardiomegaly. Aortic atherosclerosis. No evidence of
pulmonary infiltrate or pleural effusion. Tiny calcified granuloma
in the left midlung remains stable. Diffuse idiopathic skeletal
hyperostosis again seen involving the thoracic spine.
IMPRESSION: Stable mild cardiomegaly.  No active lung disease.

## 2018-10-02 NOTE — Progress Notes (Signed)
10/03/2018 10:48 AM   [name] [birth year] [id number]  Referring provider: Valerie Roys, DO [place], [place] [address]  Chief Complaint  Patient presents with  . Recurrent UTI    HPI: 63 yo WF with a history of recurrent UTI's and vaginal atrophy who presents today for a 12 month follow up.   History of rUTI's Risk factors: age and vaginal atrophy.   She had a contrast CT in 04/2016 which noted Adrenals/Urinary Tract: No masses identified. No evidence of ureteral calculi or hydronephrosis. Unopacified urinary bladder is unremarkable in appearance.  MRI in 11/2016 noted Adrenals/Urinary Tract: Normal adrenal glands. Bilateral too small to characterize renal lesions. Left extrarenal pelvis. No hydronephrosis.   She is not having any complaints of an UTI at this visit.  Her PVR is 108 mL.    Vaginal atrophy Hx of strokes, so not a candidate for vaginal estrogen cream.     PMH: Past Medical History:  Diagnosis Date  . Anemia   . Arthritis    BACK-LUMBAR  . Atrial fibrillation ([place])   . Coronary artery disease   . Dysrhythmia   . Hypertension   . Mass in neck    lt side  . Pneumonia 2016  . Stroke ([place]) 2012  . Stroke [facility]) 2012    Surgical History: Past Surgical History:  Procedure Laterality Date  . ABDOMINAL HYSTERECTOMY     Total  . cardiac catherization    . COLONOSCOPY WITH PROPOFOL N/A 11/18/2015   Procedure: COLONOSCOPY WITH PROPOFOL;  Surgeon: Lucilla Lame, MD;  Location: [facility] ENDOSCOPY;  Service: Endoscopy;  Laterality: N/A;  . CORONARY ANGIOPLASTY    . ESOPHAGOGASTRODUODENOSCOPY (EGD) WITH PROPOFOL N/A 04/23/2016   Procedure: ESOPHAGOGASTRODUODENOSCOPY (EGD) WITH PROPOFOL;  Surgeon: Lucilla Lame, MD;  Location: [facility];  Service: Endoscopy;  Laterality: N/A;  small bowel bx gastric antrum bx  . SUBMANDIBULAR GLAND EXCISION Left 09/08/2017   Procedure: EXCISION SUBMANDIBULAR GLAND;  Surgeon: Carloyn Manner, MD;  Location: [facility]  ORS;  Service: ENT;  Laterality: Left;  . throat biopsy      Home Medications:  Allergies as of 10/03/2018      Reactions   Amoxicillin Other (See Comments)   Joint pains      Medication List       Accurate as of October 03, 2018 10:48 AM. Always use your most recent med list.        amiodarone 200 MG tablet Commonly known as:  PACERONE Take 1 tablet (200 mg total) by mouth every morning.   amLODipine 5 MG tablet Commonly known as:  NORVASC Take 1 tablet (5 mg total) by mouth daily.   aspirin EC 81 MG tablet Take 81 mg by mouth daily.   CRANBERRY PO Take 1 tablet by mouth daily.   ferrous sulfate 325 (65 FE) MG EC tablet Take 1 tablet (325 mg total) by mouth 3 (three) times daily with meals.   hydrALAZINE 50 MG tablet Commonly known as:  APRESOLINE Take 1 tablet (50 mg total) by mouth 2 (two) times daily.   lisinopril-hydrochlorothiazide 20-25 MG tablet Commonly known as:  PRINZIDE,ZESTORETIC Take 1 tablet by mouth 2 (two) times daily.   multivitamin with minerals tablet Take 1 tablet by mouth daily.   vitamin C 100 MG tablet Take 100 mg by mouth daily.       Allergies:  Allergies  Allergen Reactions  . Amoxicillin Other (See Comments)    Joint pains  Family History: Family History  Problem Relation Age of Onset  . Arthritis Mother   . Cancer Mother   . Hypertension Sister   . Asthma Brother   . Dementia Brother   . Breast cancer Neg Hx   . Kidney cancer Neg Hx   . Bladder Cancer Neg Hx     Social History:  reports that she quit smoking about 42 years ago. She has never used smokeless tobacco. She reports previous alcohol use. She reports that she does not use drugs.  ROS: UROLOGY Frequent Urination?: No Hard to postpone urination?: No Burning/pain with urination?: No Get up at night to urinate?: No Leakage of urine?: No Urine stream starts and stops?: No Trouble starting stream?: No Do you have to strain to urinate?: No Blood in  urine?: No Urinary tract infection?: No Sexually transmitted disease?: No Injury to kidneys or bladder?: No Painful intercourse?: No Weak stream?: No Currently pregnant?: No Vaginal bleeding?: No Last menstrual period?: n  Gastrointestinal Nausea?: No Vomiting?: No Indigestion/heartburn?: No Diarrhea?: No Constipation?: No  Constitutional Fever: No Night sweats?: No Weight loss?: No Fatigue?: No  Skin Skin rash/lesions?: No Itching?: No  Eyes Blurred vision?: No Double vision?: No  Ears/Nose/Throat Sore throat?: No Sinus problems?: No  Hematologic/Lymphatic Swollen glands?: No Easy bruising?: No  Cardiovascular Leg swelling?: No Chest pain?: No  Respiratory Cough?: No Shortness of breath?: No  Endocrine Excessive thirst?: No  Musculoskeletal Joint pain?: No  Neurological Headaches?: No Dizziness?: No  Psychologic Depression?: No Anxiety?: No  Physical Exam: BP (!) 143/82   Pulse 70   Ht 5\' 2"  (1.575 m)   Wt 172 lb (78 kg)   BMI 31.46 kg/m   Constitutional:  Well nourished. Alert and oriented, No acute distress. HEENT: [place] AT, moist mucus membranes.  Trachea midline, no masses. Cardiovascular: No clubbing, cyanosis, or edema. Respiratory: Normal respiratory effort, no increased work of breathing. Skin: No rashes, bruises or suspicious lesions. Neurologic: Grossly intact, no focal deficits, moving all 4 extremities. Psychiatric: Normal mood and affect.   Laboratory Data: Lab Results  Component Value Date   WBC 3.0 (L) 07/24/2018   HGB 10.5 (L) 07/24/2018   HCT 34.6 (L) 07/24/2018   MCV 84.8 07/24/2018   PLT 136 (L) 07/24/2018    Lab Results  Component Value Date   CREATININE 1.03 (H) 04/03/2018    Lab Results  Component Value Date   TSH 1.130 10/03/2017       Component Value Date/Time   CHOL 174 04/03/2018 1018   HDL 40 04/03/2018 1018   LDLCALC 103 (H) 04/03/2018 1018    Lab Results  Component Value Date   AST 18  04/03/2018   Lab Results  Component Value Date   ALT 23 04/03/2018    Pertinent Imaging Results for orders placed or performed in visit on 10/03/18  Bladder Scan (Post Void Residual) in office  Result Value Ref Range   Scan Result 178ml      Assessment & Plan:    1. History of recurrent UTI's - reviewed UTI prevention strategies - states she is doing well with the Vitamin C and yogurt - states she had an UTI over one year ago - RTC for symptoms of UTI  2. Vaginal atrophy - Unfortunately patient has suffered strokes and is not a candidate for vaginal estrogen therapy - she is instructed to take probiotics or eat foods that have probiotics  Return if symptoms worsen or fail to improve.  These notes generated with voice recognition software. I apologize for typographical errors.  Zara Council, PA-C  [facility] [address] [place], [place] [address] [phone]

## 2018-10-03 ENCOUNTER — Encounter: Payer: Self-pay | Admitting: Urology

## 2018-10-03 ENCOUNTER — Other Ambulatory Visit: Payer: Self-pay

## 2018-10-03 ENCOUNTER — Ambulatory Visit (INDEPENDENT_AMBULATORY_CARE_PROVIDER_SITE_OTHER): Payer: Medicare Other | Admitting: Urology

## 2018-10-03 VITALS — BP 143/82 | HR 70 | Ht 62.0 in | Wt 172.0 lb

## 2018-10-03 DIAGNOSIS — Z87440 Personal history of urinary (tract) infections: Secondary | ICD-10-CM | POA: Diagnosis not present

## 2018-10-03 DIAGNOSIS — N952 Postmenopausal atrophic vaginitis: Secondary | ICD-10-CM

## 2018-10-03 LAB — BLADDER SCAN AMB NON-IMAGING

## 2018-10-04 ENCOUNTER — Ambulatory Visit: Payer: Medicare Other

## 2018-10-04 ENCOUNTER — Telehealth: Payer: Self-pay | Admitting: Family Medicine

## 2018-10-04 NOTE — Telephone Encounter (Signed)
Kristina Henson presented in office stating that she would like refills sent to [facility] for he following. Amlodipine 5MG , Amiodarone Hydrochloride, and Lisinopril HCTZ 20-25MG .

## 2018-10-05 MED ORDER — HYDRALAZINE HCL 50 MG PO TABS: 50.0000 mg | ORAL_TABLET | Freq: Two times a day (BID) | ORAL | 1 refills | Status: DC

## 2018-10-05 MED ORDER — LISINOPRIL-HYDROCHLOROTHIAZIDE 20-25 MG PO TABS: 1.0000 | ORAL_TABLET | Freq: Two times a day (BID) | ORAL | 1 refills | Status: DC

## 2018-10-05 MED ORDER — AMLODIPINE BESYLATE 5 MG PO TABS: 5.0000 mg | ORAL_TABLET | Freq: Every day | ORAL | 1 refills | Status: DC

## 2018-10-05 NOTE — Telephone Encounter (Signed)
Pt called back and spoke to her. Dr. Wynetta Emery message relayed. Pt verbalized understanding.

## 2018-10-05 NOTE — Telephone Encounter (Signed)
Her cardiologist writes her amiodarone. Everything else was refilled.

## 2018-10-05 NOTE — Telephone Encounter (Signed)
Tried to call pt, no answer. Left VM for pt to return call to the office.

## 2018-10-16 ENCOUNTER — Ambulatory Visit (INDEPENDENT_AMBULATORY_CARE_PROVIDER_SITE_OTHER): Payer: Medicare Other | Admitting: Family Medicine

## 2018-10-16 ENCOUNTER — Encounter: Payer: Self-pay | Admitting: Family Medicine

## 2018-10-16 VITALS — BP 137/78 | HR 62 | Temp 97.6°F | Ht 62.0 in | Wt 171.5 lb

## 2018-10-16 DIAGNOSIS — Z7189 Other specified counseling: Secondary | ICD-10-CM | POA: Diagnosis not present

## 2018-10-16 DIAGNOSIS — I700 Atherosclerosis of aorta: Secondary | ICD-10-CM

## 2018-10-16 DIAGNOSIS — I4891 Unspecified atrial fibrillation: Secondary | ICD-10-CM

## 2018-10-16 DIAGNOSIS — D500 Iron deficiency anemia secondary to blood loss (chronic): Secondary | ICD-10-CM

## 2018-10-16 DIAGNOSIS — Z0000 Encounter for general adult medical examination without abnormal findings: Secondary | ICD-10-CM

## 2018-10-16 DIAGNOSIS — E782 Mixed hyperlipidemia: Secondary | ICD-10-CM

## 2018-10-16 DIAGNOSIS — D638 Anemia in other chronic diseases classified elsewhere: Secondary | ICD-10-CM

## 2018-10-16 DIAGNOSIS — I129 Hypertensive chronic kidney disease with stage 1 through stage 4 chronic kidney disease, or unspecified chronic kidney disease: Secondary | ICD-10-CM | POA: Diagnosis not present

## 2018-10-16 LAB — MICROSCOPIC EXAMINATION

## 2018-10-16 LAB — UA/M W/RFLX CULTURE, ROUTINE
Bilirubin, UA: NEGATIVE
Glucose, UA: NEGATIVE
Ketones, UA: NEGATIVE
Nitrite, UA: NEGATIVE
Protein, UA: NEGATIVE
RBC, UA: NEGATIVE
Specific Gravity, UA: 1.025 (ref 1.005–1.030)
Urobilinogen, Ur: 0.2 mg/dL (ref 0.2–1.0)
pH, UA: 5.5 (ref 5.0–7.5)

## 2018-10-16 LAB — MICROALBUMIN, URINE WAIVED
Creatinine, Urine Waived: 200 mg/dL (ref 10–300)
Microalb, Ur Waived: 30 mg/L — ABNORMAL HIGH (ref 0–19)

## 2018-10-16 NOTE — Assessment & Plan Note (Signed)
Stable. Continue to monitor. Labs drawn today. Will follow up with hematology as needed.

## 2018-10-16 NOTE — Assessment & Plan Note (Signed)
In regular rhythm today. Continue to follow with cardiology. Call with any concerns.

## 2018-10-16 NOTE — Assessment & Plan Note (Signed)
A voluntary discussion about advance care planning including the explanation and discussion of advance directives was extensively discussed  with the patient for 3 minutes with patient present.  Explanation about the health care proxy and Living will was reviewed and packet with forms with explanation of how to fill them out was given.  During this discussion, the patient was able to identify a health care proxy as her daughter and plans to fill out the paperwork required.  Patient was offered a separate [place] visit for further assistance with forms.

## 2018-10-16 NOTE — Progress Notes (Signed)
BP 137/78   Pulse 62   Temp 97.6 F (36.4 C) (Oral)   Ht 5\' 2"  (1.575 m)   Wt 171 lb 8 oz (77.8 kg)   SpO2 96%   BMI 31.37 kg/m    Subjective:    Patient ID: [name], female    DOB: 12/07/1955, 63 y.o.   MRN: [id number]  HPI: AINSLIE MAZUREK is a 63 y.o. female presenting on 10/16/2018 for comprehensive medical examination. Current medical complaints include:  HYPERTENSION / HYPERLIPIDEMIA Satisfied with current treatment? yes Duration of hypertension: chronic BP monitoring frequency: not checking BP medication side effects: no Past BP meds: amlopidine, lisinopril-hctz Duration of hyperlipidemia: chronic Cholesterol medication side effects: no Cholesterol supplements: none Past cholesterol medications: none Medication compliance: excellent compliance Aspirin: yes Recent stressors: no Recurrent headaches: no Visual changes: no Palpitations: no Dyspnea: no Chest pain: no Lower extremity edema: no Dizzy/lightheaded: no  ANEMIA Anemia status: controlled Etiology of anemia: iron deficiency Duration of anemia treatment: chronic Compliance with treatment: excellent compliance Iron supplementation side effects: no Severity of anemia: moderate Fatigue: yes Decreased exercise tolerance: no  Dyspnea on exertion: no Palpitations: no Bleeding: no Pica: no  Menopausal Symptoms: no  Functional Status Survey: Is the patient deaf or have difficulty hearing?: No Does the patient have difficulty seeing, even when wearing glasses/contacts?: No Does the patient have difficulty concentrating, remembering, or making decisions?: No Does the patient have difficulty walking or climbing stairs?: No Does the patient have difficulty dressing or bathing?: No Does the patient have difficulty doing errands alone such as visiting a doctor's office or shopping?: No  Fall Risk  10/16/2018 04/03/2018 10/03/2017 07/19/2017 05/24/2017  Falls in the past year? 0 No No No No  Number falls  in past yr: 0 - - - -  Injury with Fall? 0 - - - -    Depression Screen Depression screen [facility] 2/9 10/16/2018 04/03/2018 10/03/2017 07/19/2017 08/11/2016  Decreased Interest 0 0 0 0 0  Down, Depressed, Hopeless 0 0 0 0 0  PHQ - 2 Score 0 0 0 0 0  Altered sleeping 0 0 - - -  Tired, decreased energy 0 0 - - -  Change in appetite 0 0 - - -  Feeling bad or failure about yourself  0 0 - - -  Trouble concentrating 0 0 - - -  Moving slowly or fidgety/restless 0 0 - - -  Suicidal thoughts 0 0 - - -  PHQ-9 Score 0 0 - - -  Difficult doing work/chores Not difficult at all Not difficult at all - - -   Advanced Directives Does patient have a HCPOA?    no Does patient have a living will or MOST form?  no  Past Medical History:  Past Medical History:  Diagnosis Date  . Anemia   . Arthritis    BACK-LUMBAR  . Atrial fibrillation ([place])   . Coronary artery disease   . Duodenitis   . Dysrhythmia   . Gastritis   . Hypertension   . Mass in neck    lt side  . Pneumonia 2016  . Stroke ([place]) 2012  . Stroke [facility]) 2012   Surgical History:  Past Surgical History:  Procedure Laterality Date  . ABDOMINAL HYSTERECTOMY     Total  . cardiac catherization    . COLONOSCOPY WITH PROPOFOL N/A 11/18/2015   Procedure: COLONOSCOPY WITH PROPOFOL;  Surgeon: Lucilla Lame, MD;  Location: [facility] ENDOSCOPY;  Service: Endoscopy;  Laterality:  N/A;  . CORONARY ANGIOPLASTY    . ESOPHAGOGASTRODUODENOSCOPY (EGD) WITH PROPOFOL N/A 04/23/2016   Procedure: ESOPHAGOGASTRODUODENOSCOPY (EGD) WITH PROPOFOL;  Surgeon: Lucilla Lame, MD;  Location: [place];  Service: Endoscopy;  Laterality: N/A;  small bowel bx gastric antrum bx  . SUBMANDIBULAR GLAND EXCISION Left 09/08/2017   Procedure: EXCISION SUBMANDIBULAR GLAND;  Surgeon: Carloyn Manner, MD;  Location: [facility] ORS;  Service: ENT;  Laterality: Left;  . throat biopsy      Medications:  Current Outpatient Medications on File Prior to Visit  Medication Sig  .  amiodarone (PACERONE) 200 MG tablet Take 1 tablet (200 mg total) by mouth every morning.  Marland Kitchen amLODipine (NORVASC) 5 MG tablet Take 1 tablet (5 mg total) by mouth daily.  . Ascorbic Acid (VITAMIN C) 100 MG tablet Take 100 mg by mouth daily.  Marland Kitchen aspirin EC 81 MG tablet Take 81 mg by mouth daily.   Marland Kitchen CRANBERRY PO Take 1 tablet by mouth daily.  . ferrous sulfate 325 (65 FE) MG EC tablet Take 1 tablet (325 mg total) by mouth 3 (three) times daily with meals.  . hydrALAZINE (APRESOLINE) 50 MG tablet Take 1 tablet (50 mg total) by mouth 2 (two) times daily.  Marland Kitchen lisinopril-hydrochlorothiazide (PRINZIDE,ZESTORETIC) 20-25 MG tablet Take 1 tablet by mouth 2 (two) times daily.  . Multiple Vitamins-Minerals (MULTIVITAMIN WITH MINERALS) tablet Take 1 tablet by mouth daily.   No current facility-administered medications on file prior to visit.     Allergies:  Allergies  Allergen Reactions  . Amoxicillin Other (See Comments)    Joint pains    Social History:  Social History   Socioeconomic History  . Marital status: Divorced    Spouse name: Not on file  . Number of children: Not on file  . Years of education: 39  . Highest education level: 12th grade  Occupational History  . Not on file  Social Needs  . Financial resource strain: Not hard at all  . Food insecurity:    Worry: Never true    Inability: Never true  . Transportation needs:    Medical: No    Non-medical: No  Tobacco Use  . Smoking status: Former Smoker    Last attempt to quit: 1978    Years since quitting: 42.0  . Smokeless tobacco: Never Used  Substance and Sexual Activity  . Alcohol use: Not Currently    Frequency: Never  . Drug use: No  . Sexual activity: Yes  Lifestyle  . Physical activity:    Days per week: 5 days    Minutes per session: 50 min  . Stress: Not at all  Relationships  . Social connections:    Talks on phone: Once a week    Gets together: More than three times a week    Attends religious service:  Never    Active member of club or organization: No    Attends meetings of clubs or organizations: Never    Relationship status: Divorced  . Intimate partner violence:    Fear of current or ex partner: No    Emotionally abused: No    Physically abused: No    Forced sexual activity: No  Other Topics Concern  . Not on file  Social History Narrative  . Not on file   Social History   Tobacco Use  Smoking Status Former Smoker  . Last attempt to quit: 1978  . Years since quitting: 42.0  Smokeless Tobacco Never Used   Social History  Substance and Sexual Activity  Alcohol Use Not Currently  . Frequency: Never    Family History:  Family History  Problem Relation Age of Onset  . Arthritis Mother   . Cancer Mother   . Hypertension Sister   . Asthma Brother   . Dementia Brother   . Breast cancer Neg Hx   . Kidney cancer Neg Hx   . Bladder Cancer Neg Hx     Past medical history, surgical history, medications, allergies, family history and social history reviewed with patient today and changes made to appropriate areas of the chart.   Review of Systems  Constitutional: Negative.   HENT: Negative.   Eyes: Negative.   Respiratory: Negative.   Cardiovascular: Negative.   Gastrointestinal: Negative.   Genitourinary: Negative.   Musculoskeletal: Negative.   Skin: Negative.   Neurological: Negative.   Endo/Heme/Allergies: Negative.   Psychiatric/Behavioral: Negative.     All other ROS negative except what is listed above and in the HPI.      Objective:    BP 137/78   Pulse 62   Temp 97.6 F (36.4 C) (Oral)   Ht 5\' 2"  (1.575 m)   Wt 171 lb 8 oz (77.8 kg)   SpO2 96%   BMI 31.37 kg/m   Wt Readings from Last 3 Encounters:  10/16/18 171 lb 8 oz (77.8 kg)  10/03/18 172 lb (78 kg)  07/24/18 172 lb (78 kg)    Physical Exam Vitals signs and nursing note reviewed.  Constitutional:      General: She is not in acute distress.    Appearance: Normal appearance. She is  not ill-appearing, toxic-appearing or diaphoretic.  HENT:     Head: Normocephalic and atraumatic.     Right Ear: Tympanic membrane, ear canal and external ear normal. There is no impacted cerumen.     Left Ear: Tympanic membrane, ear canal and external ear normal. There is no impacted cerumen.     Nose: Nose normal. No congestion or rhinorrhea.     Mouth/Throat:     Mouth: Mucous membranes are moist.     Pharynx: Oropharynx is clear. No oropharyngeal exudate or posterior oropharyngeal erythema.  Eyes:     General: No scleral icterus.       Right eye: No discharge.        Left eye: No discharge.     Extraocular Movements: Extraocular movements intact.     Conjunctiva/sclera: Conjunctivae normal.     Pupils: Pupils are equal, round, and reactive to light.  Neck:     Musculoskeletal: Normal range of motion and neck supple. No neck rigidity or muscular tenderness.     Vascular: No carotid bruit.  Cardiovascular:     Rate and Rhythm: Normal rate and regular rhythm.     Pulses: Normal pulses.     Heart sounds: No murmur. No friction rub. No gallop.   Pulmonary:     Effort: Pulmonary effort is normal. No respiratory distress.     Breath sounds: Normal breath sounds. No stridor. No wheezing, rhonchi or rales.  Chest:     Chest wall: No tenderness.  Abdominal:     General: Abdomen is flat. Bowel sounds are normal. There is no distension.     Palpations: Abdomen is soft. There is no mass.     Tenderness: There is no abdominal tenderness. There is no right CVA tenderness, left CVA tenderness, guarding or rebound.     Hernia: No hernia is present.  Genitourinary:  Comments: Breast and pelvic exams deferred with shared decision making Musculoskeletal:        General: No swelling, tenderness, deformity or signs of injury.     Right lower leg: No edema.     Left lower leg: No edema.  Lymphadenopathy:     Cervical: No cervical adenopathy.  Skin:    General: Skin is warm and dry.      Capillary Refill: Capillary refill takes less than 2 seconds.     Coloration: Skin is not jaundiced or pale.     Findings: No bruising, erythema, lesion or rash.  Neurological:     General: No focal deficit present.     Mental Status: She is alert and oriented to person, place, and time. Mental status is at baseline.     Cranial Nerves: No cranial nerve deficit.     Sensory: No sensory deficit.     Motor: No weakness.     Coordination: Coordination normal.     Gait: Gait normal.     Deep Tendon Reflexes: Reflexes normal.  Psychiatric:        Mood and Affect: Mood normal.        Behavior: Behavior normal.        Thought Content: Thought content normal.        Judgment: Judgment normal.    6CIT Screen 10/16/2018 10/03/2017 08/11/2016  What Year? 0 points 0 points 0 points  What month? 0 points 0 points 0 points  What time? 0 points 0 points 0 points  Count back from 20 0 points 0 points 2 points  Months in reverse 0 points 0 points 0 points  Repeat phrase 4 points 0 points 2 points  Total Score 4 0 4    Results for orders placed or performed in visit on 10/03/18  Bladder Scan (Post Void Residual) in office  Result Value Ref Range   Scan Result 140ml       Assessment & Plan:   Problem List Items Addressed This Visit      Cardiovascular and Mediastinum   Atrial fibrillation ([place])    In regular rhythm today. Continue to follow with cardiology. Call with any concerns.       Relevant Orders   Comprehensive metabolic panel   Atherosclerosis of aorta (HCC)    Will keep BP and cholesterol under good control. Continue to monitor. Call with any concerns.       Relevant Orders   Comprehensive metabolic panel     Genitourinary   Benign hypertensive renal disease    Under good control on current regimen. Continue current regimen. Continue to monitor. Call with any concerns. Refills given. Labs checked today.       Relevant Orders   Comprehensive metabolic panel    Microalbumin, Urine Waived   TSH   UA/M w/rflx Culture, Routine     Other   HLD (hyperlipidemia)    Rechecking levels today. Await results. Call with any concerns.       Relevant Orders   Comprehensive metabolic panel   Lipid Panel w/o Chol/HDL Ratio   Iron deficiency anemia due to chronic blood loss    Stable. Continue to monitor. Labs drawn today. Will follow up with hematology as needed.       Relevant Orders   Comprehensive metabolic panel   CBC with Differential/Platelet   Anemia due to chronic illness    Stable. Continue to monitor. Labs drawn today. Will follow up with hematology as needed.  Relevant Orders   Comprehensive metabolic panel   CBC with Differential/Platelet   Advanced directives, counseling/discussion    A voluntary discussion about advance care planning including the explanation and discussion of advance directives was extensively discussed  with the patient for 3 minutes with patient present.  Explanation about the health care proxy and Living will was reviewed and packet with forms with explanation of how to fill them out was given.  During this discussion, the patient was able to identify a health care proxy as her daughter and plans to fill out the paperwork required.  Patient was offered a separate [place] visit for further assistance with forms.          Other Visit Diagnoses    Wellness examination    -  Primary   Preventative care discusssed today as below. Call with any concerns.    Routine general medical examination at a health care facility       Vaccines up to date. Screening labs checked today. Pap N/A. Mammogram and colonoscopy up to date. Continue diet and exercise. Call with any concerns.        Preventative Services:  Health Risk Assessment and Personalized Prevention Plan: Done today Bone Mass Measurements: N/A Breast Cancer Screening: Up to date CVD Screening: Done today Cervical Cancer Screening: N/A Colon  Cancer Screening: Up to date Depression Screening: Done today Diabetes Screening: Done today Glaucoma Screening: See your eye doctor Hepatitis B vaccine: N/A Hepatitis C screening: Up to date HIV Screening: Up to date  Flu Vaccine: Up to date Lung cancer Screening: N/A Obesity Screening: Done today Pneumonia Vaccines (2): Up to date STI Screening: N/A  Follow up plan: Return in about 6 months (around 04/16/2019) for Follow up.   LABORATORY TESTING:  - Pap smear: not applicable  IMMUNIZATIONS:   - Tdap: Tetanus vaccination status reviewed: last tetanus booster within 10 years. - Influenza: Up to date - Pneumovax: Up to date - Prevnar: Not applicable  SCREENING: -Mammogram: Up to date  - Colonoscopy: Up to date    PATIENT COUNSELING:   Advised to take 1 mg of folate supplement per day if capable of pregnancy.   Sexuality: Discussed sexually transmitted diseases, partner selection, use of condoms, avoidance of unintended pregnancy  and contraceptive alternatives.   Advised to avoid cigarette smoking.  I discussed with the patient that most people either abstain from alcohol or drink within safe limits (<=14/week and <=4 drinks/occasion for males, <=7/weeks and <= 3 drinks/occasion for females) and that the risk for alcohol disorders and other health effects rises proportionally with the number of drinks per week and how often a drinker exceeds daily limits.  Discussed cessation/primary prevention of drug use and availability of treatment for abuse.   Diet: Encouraged to adjust caloric intake to maintain  or achieve ideal body weight, to reduce intake of dietary saturated fat and total fat, to limit sodium intake by avoiding high sodium foods and not adding table salt, and to maintain adequate dietary potassium and calcium preferably from fresh fruits, vegetables, and low-fat dairy products.    stressed the importance of regular exercise  Injury prevention: Discussed safety  belts, safety helmets, smoke detector, smoking near bedding or upholstery.   Dental health: Discussed importance of regular tooth brushing, flossing, and dental visits.    NEXT PREVENTATIVE PHYSICAL DUE IN 1 YEAR. Return in about 6 months (around 04/16/2019) for Follow up.

## 2018-10-16 NOTE — Assessment & Plan Note (Signed)
Under good control on current regimen. Continue current regimen. Continue to monitor. Call with any concerns. Refills given. Labs checked today.  

## 2018-10-16 NOTE — Assessment & Plan Note (Signed)
Rechecking levels today. Await results. Call with any concerns.  

## 2018-10-16 NOTE — Patient Instructions (Addendum)
Preventative Services:  Health Risk Assessment and Personalized Prevention Plan: Done today Bone Mass Measurements: N/A Breast Cancer Screening: Up to date CVD Screening: Done today Cervical Cancer Screening: N/A Colon Cancer Screening: Up to date Depression Screening: Done today Diabetes Screening: Done today Glaucoma Screening: See your eye doctor Hepatitis B vaccine: N/A Hepatitis C screening: Up to date HIV Screening: Up to date  Flu Vaccine: Up to date Lung cancer Screening: N/A Obesity Screening: Done today Pneumonia Vaccines (2): Up to date STI Screening: N/A   Health Maintenance for Postmenopausal Women Menopause is a normal process in which your reproductive ability comes to an end. This process happens gradually over a span of months to years, usually between the ages of 75 and 25. Menopause is complete when you have missed 12 consecutive menstrual periods. It is important to talk with your health care provider about some of the most common conditions that affect postmenopausal women, such as heart disease, cancer, and bone loss (osteoporosis). Adopting a healthy lifestyle and getting preventive care can help to promote your health and wellness. Those actions can also lower your chances of developing some of these common conditions. What should I know about menopause? During menopause, you may experience a number of symptoms, such as:  Moderate-to-severe hot flashes.  Night sweats.  Decrease in sex drive.  Mood swings.  Headaches.  Tiredness.  Irritability.  Memory problems.  Insomnia. Choosing to treat or not to treat menopausal changes is an individual decision that you make with your health care provider. What should I know about hormone replacement therapy and supplements? Hormone therapy products are effective for treating symptoms that are associated with menopause, such as hot flashes and night sweats. Hormone replacement carries certain risks, especially  as you become older. If you are thinking about using estrogen or estrogen with progestin treatments, discuss the benefits and risks with your health care provider. What should I know about heart disease and stroke? Heart disease, heart attack, and stroke become more likely as you age. This may be due, in part, to the hormonal changes that your body experiences during menopause. These can affect how your body processes dietary fats, triglycerides, and cholesterol. Heart attack and stroke are both medical emergencies. There are many things that you can do to help prevent heart disease and stroke:  Have your blood pressure checked at least every 1-2 years. High blood pressure causes heart disease and increases the risk of stroke.  If you are 25-71 years old, ask your health care provider if you should take aspirin to prevent a heart attack or a stroke.  Do not use any tobacco products, including cigarettes, chewing tobacco, or electronic cigarettes. If you need help quitting, ask your health care provider.  It is important to eat a healthy diet and maintain a healthy weight. ? Be sure to include plenty of vegetables, fruits, low-fat dairy products, and lean protein. ? Avoid eating foods that are high in solid fats, added sugars, or salt (sodium).  Get regular exercise. This is one of the most important things that you can do for your health. ? Try to exercise for at least 150 minutes each week. The type of exercise that you do should increase your heart rate and make you sweat. This is known as moderate-intensity exercise. ? Try to do strengthening exercises at least twice each week. Do these in addition to the moderate-intensity exercise.  Know your numbers.Ask your health care provider to check your cholesterol and your blood  glucose. Continue to have your blood tested as directed by your health care provider.  What should I know about cancer screening? There are several types of cancer. Take  the following steps to reduce your risk and to catch any cancer development as early as possible. Breast Cancer  Practice breast self-awareness. ? This means understanding how your breasts normally appear and feel. ? It also means doing regular breast self-exams. Let your health care provider know about any changes, no matter how small.  If you are 56 or older, have a clinician do a breast exam (clinical breast exam or CBE) every year. Depending on your age, family history, and medical history, it may be recommended that you also have a yearly breast X-ray (mammogram).  If you have a family history of breast cancer, talk with your health care provider about genetic screening.  If you are at high risk for breast cancer, talk with your health care provider about having an MRI and a mammogram every year.  Breast cancer (BRCA) gene test is recommended for women who have family members with BRCA-related cancers. Results of the assessment will determine the need for genetic counseling and BRCA1 and for BRCA2 testing. BRCA-related cancers include these types: ? Breast. This occurs in males or females. ? Ovarian. ? Tubal. This may also be called fallopian tube cancer. ? Cancer of the abdominal or pelvic lining (peritoneal cancer). ? Prostate. ? Pancreatic. Cervical, Uterine, and Ovarian Cancer Your health care provider may recommend that you be screened regularly for cancer of the pelvic organs. These include your ovaries, uterus, and vagina. This screening involves a pelvic exam, which includes checking for microscopic changes to the surface of your cervix (Pap test).  For women ages 21-65, health care providers may recommend a pelvic exam and a Pap test every three years. For women ages 57-65, they may recommend the Pap test and pelvic exam, combined with testing for human papilloma virus (HPV), every five years. Some types of HPV increase your risk of cervical cancer. Testing for HPV may also be  done on women of any age who have unclear Pap test results.  Other health care providers may not recommend any screening for nonpregnant women who are considered low risk for pelvic cancer and have no symptoms. Ask your health care provider if a screening pelvic exam is right for you.  If you have had past treatment for cervical cancer or a condition that could lead to cancer, you need Pap tests and screening for cancer for at least 20 years after your treatment. If Pap tests have been discontinued for you, your risk factors (such as having a new sexual partner) need to be reassessed to determine if you should start having screenings again. Some women have medical problems that increase the chance of getting cervical cancer. In these cases, your health care provider may recommend that you have screening and Pap tests more often.  If you have a family history of uterine cancer or ovarian cancer, talk with your health care provider about genetic screening.  If you have vaginal bleeding after reaching menopause, tell your health care provider.  There are currently no reliable tests available to screen for ovarian cancer. Lung Cancer Lung cancer screening is recommended for adults 35-77 years old who are at high risk for lung cancer because of a history of smoking. A yearly low-dose CT scan of the lungs is recommended if you:  Currently smoke.  Have a history of at least 30  pack-years of smoking and you currently smoke or have quit within the past 15 years. A pack-year is smoking an average of one pack of cigarettes per day for one year. Yearly screening should:  Continue until it has been 15 years since you quit.  Stop if you develop a health problem that would prevent you from having lung cancer treatment. Colorectal Cancer  This type of cancer can be detected and can often be prevented.  Routine colorectal cancer screening usually begins at age 20 and continues through age 63.  If you have  risk factors for colon cancer, your health care provider may recommend that you be screened at an earlier age.  If you have a family history of colorectal cancer, talk with your health care provider about genetic screening.  Your health care provider may also recommend using home test kits to check for hidden blood in your stool.  A small camera at the end of a tube can be used to examine your colon directly (sigmoidoscopy or colonoscopy). This is done to check for the earliest forms of colorectal cancer.  Direct examination of the colon should be repeated every 5-10 years until age 72. However, if early forms of precancerous polyps or small growths are found or if you have a family history or genetic risk for colorectal cancer, you may need to be screened more often. Skin Cancer  Check your skin from head to toe regularly.  Monitor any moles. Be sure to tell your health care provider: ? About any new moles or changes in moles, especially if there is a change in a mole's shape or color. ? If you have a mole that is larger than the size of a pencil eraser.  If any of your family members has a history of skin cancer, especially at a young age, talk with your health care provider about genetic screening.  Always use sunscreen. Apply sunscreen liberally and repeatedly throughout the day.  Whenever you are outside, protect yourself by wearing long sleeves, pants, a wide-brimmed hat, and sunglasses. What should I know about osteoporosis? Osteoporosis is a condition in which bone destruction happens more quickly than new bone creation. After menopause, you may be at an increased risk for osteoporosis. To help prevent osteoporosis or the bone fractures that can happen because of osteoporosis, the following is recommended:  If you are 81-68 years old, get at least 1,000 mg of calcium and at least 600 mg of vitamin D per day.  If you are older than age 43 but younger than age 44, get at least 1,200  mg of calcium and at least 600 mg of vitamin D per day.  If you are older than age 67, get at least 1,200 mg of calcium and at least 800 mg of vitamin D per day. Smoking and excessive alcohol intake increase the risk of osteoporosis. Eat foods that are rich in calcium and vitamin D, and do weight-bearing exercises several times each week as directed by your health care provider. What should I know about how menopause affects my mental health? Depression may occur at any age, but it is more common as you become older. Common symptoms of depression include:  Low or sad mood.  Changes in sleep patterns.  Changes in appetite or eating patterns.  Feeling an overall lack of motivation or enjoyment of activities that you previously enjoyed.  Frequent crying spells. Talk with your health care provider if you think that you are experiencing depression. What should  I know about immunizations? It is important that you get and maintain your immunizations. These include:  Tetanus, diphtheria, and pertussis (Tdap) booster vaccine.  Influenza every year before the flu season begins.  Pneumonia vaccine.  Shingles vaccine. Your health care provider may also recommend other immunizations. This information is not intended to replace advice given to you by your health care provider. Make sure you discuss any questions you have with your health care provider. Document Released: 11/05/2005 Document Revised: 04/02/2016 Document Reviewed: 06/17/2015 Elsevier Interactive Patient Education  2019 [employer].

## 2018-10-16 NOTE — Assessment & Plan Note (Signed)
Will keep BP and cholesterol under good control. Continue to monitor. Call with any concerns.  

## 2018-10-17 ENCOUNTER — Encounter: Payer: Self-pay | Admitting: Family Medicine

## 2018-10-17 LAB — CBC WITH DIFFERENTIAL/PLATELET
BASOS ABS: 0 10*3/uL (ref 0.0–0.2)
Basos: 1 %
EOS (ABSOLUTE): 0.1 10*3/uL (ref 0.0–0.4)
Eos: 2 %
Hematocrit: 36.2 % (ref 34.0–46.6)
Hemoglobin: 11.8 g/dL (ref 11.1–15.9)
Immature Grans (Abs): 0 10*3/uL (ref 0.0–0.1)
Immature Granulocytes: 1 %
LYMPHS ABS: 0.6 10*3/uL — AB (ref 0.7–3.1)
Lymphs: 17 %
MCH: 27 pg (ref 26.6–33.0)
MCHC: 32.6 g/dL (ref 31.5–35.7)
MCV: 83 fL (ref 79–97)
Monocytes Absolute: 0.3 10*3/uL (ref 0.1–0.9)
Monocytes: 9 %
Neutrophils Absolute: 2.4 10*3/uL (ref 1.4–7.0)
Neutrophils: 70 %
PLATELETS: 144 10*3/uL — AB (ref 150–450)
RBC: 4.37 x10E6/uL (ref 3.77–5.28)
RDW: 16 % — ABNORMAL HIGH (ref 11.7–15.4)
WBC: 3.4 10*3/uL (ref 3.4–10.8)

## 2018-10-17 LAB — COMPREHENSIVE METABOLIC PANEL
A/G RATIO: 1.3 (ref 1.2–2.2)
ALBUMIN: 4 g/dL (ref 3.8–4.9)
ALT: 35 IU/L — ABNORMAL HIGH (ref 0–32)
AST: 26 IU/L (ref 0–40)
Alkaline Phosphatase: 95 IU/L (ref 39–117)
BUN / CREAT RATIO: 27 (ref 12–28)
BUN: 25 mg/dL (ref 8–27)
Bilirubin Total: 0.5 mg/dL (ref 0.0–1.2)
CHLORIDE: 103 mmol/L (ref 96–106)
CO2: 24 mmol/L (ref 20–29)
Calcium: 9.6 mg/dL (ref 8.7–10.3)
Creatinine, Ser: 0.91 mg/dL (ref 0.57–1.00)
GFR calc non Af Amer: 69 mL/min/{1.73_m2} (ref >59)
GFR, EST AFRICAN AMERICAN: 79 mL/min/{1.73_m2} (ref >59)
GLOBULIN, TOTAL: 3.1 g/dL (ref 1.5–4.5)
Glucose: 97 mg/dL (ref 65–99)
POTASSIUM: 3.9 mmol/L (ref 3.5–5.2)
SODIUM: 142 mmol/L (ref 134–144)
TOTAL PROTEIN: 7.1 g/dL (ref 6.0–8.5)

## 2018-10-17 LAB — LIPID PANEL W/O CHOL/HDL RATIO
CHOLESTEROL TOTAL: 160 mg/dL (ref 100–199)
HDL: 40 mg/dL (ref >39)
LDL CALC: 90 mg/dL (ref 0–99)
Triglycerides: 150 mg/dL — ABNORMAL HIGH (ref 0–149)
VLDL Cholesterol Cal: 30 mg/dL (ref 5–40)

## 2018-10-17 LAB — TSH: TSH: 1.37 u[IU]/mL (ref 0.450–4.500)

## 2018-10-19 NOTE — Telephone Encounter (Signed)
Copied from [place] [id number]. Topic: Medicare AWV >> Oct 19, 2018 11:54 AM Sherren Kerns wrote: Called to Re-schedule Medicare Annual Wellness Visit with the Nurse Health Advisor. Original appointment is 09/09/2019 at 10:30 am.  Please reschedule due to an adjustment in [place].    If patient returns call, please reschedule AWV with [facility] any date AFTER 09/07/2018.    Thank you! For any questions please contact: Janace Hoard at [phone] or Skype [email or web address]

## 2018-11-05 ENCOUNTER — Encounter: Payer: Self-pay | Admitting: Emergency Medicine

## 2018-11-05 ENCOUNTER — Other Ambulatory Visit: Payer: Self-pay

## 2018-11-05 ENCOUNTER — Emergency Department
Admission: EM | Admit: 2018-11-05 | Discharge: 2018-11-05 | Disposition: A | Discharge status: Home / Self Care | Payer: Medicare Other | Attending: Emergency Medicine | Admitting: Emergency Medicine

## 2018-11-05 ENCOUNTER — Emergency Department: Payer: Medicare Other

## 2018-11-05 DIAGNOSIS — J9801 Acute bronchospasm: Secondary | ICD-10-CM | POA: Diagnosis not present

## 2018-11-05 DIAGNOSIS — J069 Acute upper respiratory infection, unspecified: Secondary | ICD-10-CM | POA: Insufficient documentation

## 2018-11-05 DIAGNOSIS — Z87891 Personal history of nicotine dependence: Secondary | ICD-10-CM | POA: Insufficient documentation

## 2018-11-05 DIAGNOSIS — I4891 Unspecified atrial fibrillation: Secondary | ICD-10-CM | POA: Insufficient documentation

## 2018-11-05 DIAGNOSIS — B9789 Other viral agents as the cause of diseases classified elsewhere: Secondary | ICD-10-CM | POA: Diagnosis not present

## 2018-11-05 DIAGNOSIS — I2510 Atherosclerotic heart disease of native coronary artery without angina pectoris: Secondary | ICD-10-CM | POA: Diagnosis not present

## 2018-11-05 DIAGNOSIS — I10 Essential (primary) hypertension: Secondary | ICD-10-CM | POA: Insufficient documentation

## 2018-11-05 DIAGNOSIS — R05 Cough: Secondary | ICD-10-CM | POA: Diagnosis not present

## 2018-11-05 DIAGNOSIS — Z79899 Other long term (current) drug therapy: Secondary | ICD-10-CM | POA: Insufficient documentation

## 2018-11-05 DIAGNOSIS — Z7982 Long term (current) use of aspirin: Secondary | ICD-10-CM | POA: Insufficient documentation

## 2018-11-05 DIAGNOSIS — R0602 Shortness of breath: Secondary | ICD-10-CM | POA: Diagnosis not present

## 2018-11-05 MED ORDER — IPRATROPIUM-ALBUTEROL 0.5-2.5 (3) MG/3ML IN SOLN
3.0000 mL | Freq: Once | RESPIRATORY_TRACT | Status: AC
  Administered 2018-11-05: 3 mL via RESPIRATORY_TRACT
  Filled 2018-11-05: qty 3

## 2018-11-05 MED ORDER — PREDNISONE 20 MG PO TABS: 20.0000 mg | ORAL_TABLET | Freq: Two times a day (BID) | ORAL | 0 refills | Status: AC

## 2018-11-05 NOTE — ED Triage Notes (Signed)
Pt presents to ED via POV with c/o non-productive cough x 3 days. Pt states has been taking OTC cold medication without relief. Respirations even and unlabored in triage. Pt also c/o sore throat from coughing so much.

## 2018-11-05 NOTE — Discharge Instructions (Addendum)
Your exam and CXR are negative for pneumonia or bronchitis. You are being treated for a viral URI with bronchospasm. Take the steroid as directed. Take your previously prescribed albuterol inhaler as directed. Take OTC Delsym for additional cough relief. Follow-up with your provider, or return as needed.

## 2018-11-05 NOTE — ED Notes (Signed)
First nurse note  Presents with cough and weakness  States she "just doesn't feel good"

## 2018-11-05 NOTE — ED Notes (Signed)
Pt c/o non-productive cough x4 days with chills and headache. Pt denies n/v and diarrhea.

## 2018-11-05 NOTE — ED Provider Notes (Signed)
[facility] Emergency Department Provider Note ____________________________________________  Time seen: 1454  I have reviewed the triage vital signs and the nursing notes.  HISTORY  Chief Complaint  Cough  HPI Kristina Henson is a 63 y.o. female presents to the ED for evaluation of 3-day complaints of cough and congestion.  Patient describes a nonproductive cough and has not had any benefit with over-the-counter cold medicines.  She also reports a sore throat related to her cough.  She denies any fevers, chest pain, or significant shortness of breath.  She gives a remote history of pneumonia and bronchitis in the past.  Past Medical History:  Diagnosis Date  . Anemia   . Arthritis    BACK-LUMBAR  . Atrial fibrillation ([place])   . Coronary artery disease   . Duodenitis   . Dysrhythmia   . Gastritis   . Hypertension   . Mass in neck    lt side  . Pneumonia 2016  . Stroke ([place]) 2012  . Stroke [facility]) 2012    Patient Active Problem List   Diagnosis Date Noted  . Advanced directives, counseling/discussion 10/16/2018  . Atherosclerosis of aorta ([place]) 04/03/2018  . H/O submandibular gland removal 09/08/2017  . Liver hemangioma 06/14/2016  . 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 Anemia due to chronic illness 04/30/2016  . Iron deficiency anemia due to chronic blood loss   . Benign neoplasm of sigmoid colon   . Benign hypertensive renal disease   . Atrial fibrillation ([place])   . HLD (hyperlipidemia) 07/11/2014  . History of CVA with residual deficit 07/11/2014    Past Surgical History:  Procedure Laterality Date  . ABDOMINAL HYSTERECTOMY     Total  . cardiac catherization    . COLONOSCOPY WITH PROPOFOL N/A 11/18/2015   Procedure: COLONOSCOPY WITH PROPOFOL;  Surgeon: Lucilla Lame, MD;  Location: [facility] ENDOSCOPY;  Service: Endoscopy;  Laterality: N/A;  . CORONARY ANGIOPLASTY    . ESOPHAGOGASTRODUODENOSCOPY (EGD) WITH PROPOFOL N/A 04/23/2016   Procedure:  ESOPHAGOGASTRODUODENOSCOPY (EGD) WITH PROPOFOL;  Surgeon: Lucilla Lame, MD;  Location: [place];  Service: Endoscopy;  Laterality: N/A;  small bowel bx gastric antrum bx  . SUBMANDIBULAR GLAND EXCISION Left 09/08/2017   Procedure: EXCISION SUBMANDIBULAR GLAND;  Surgeon: Carloyn Manner, MD;  Location: [facility] ORS;  Service: ENT;  Laterality: Left;  . throat biopsy      Prior to Admission medications   Medication Sig Start Date End Date Taking? Authorizing Provider  amiodarone (PACERONE) 200 MG tablet Take 1 tablet (200 mg total) by mouth every morning. 04/03/18   Johnson, Megan P, DO  amLODipine (NORVASC) 5 MG tablet Take 1 tablet (5 mg total) by mouth daily. 10/05/18   Johnson, Megan P, DO  Ascorbic Acid (VITAMIN C) 100 MG tablet Take 100 mg by mouth daily.    [provider]  aspirin EC 81 MG tablet Take 81 mg by mouth daily.     [provider]  CRANBERRY PO Take 1 tablet by mouth daily.    [provider]  ferrous sulfate 325 (65 FE) MG EC tablet Take 1 tablet (325 mg total) by mouth 3 (three) times daily with meals. 04/03/18   Johnson, Megan P, DO  hydrALAZINE (APRESOLINE) 50 MG tablet Take 1 tablet (50 mg total) by mouth 2 (two) times daily. 10/05/18   Johnson, Megan P, DO  lisinopril-hydrochlorothiazide (PRINZIDE,ZESTORETIC) 20-25 MG tablet Take 1 tablet by mouth 2 (two) times daily. 10/05/18   Valerie Roys, DO  Multiple Vitamins-Minerals (MULTIVITAMIN WITH MINERALS) tablet Take 1 tablet by mouth daily.    [provider]  predniSONE (DELTASONE) 20 MG tablet Take 1 tablet (20 mg total) by mouth 2 (two) times daily with a meal for 5 days. 11/05/18 11/10/18  Eliakim Tendler, Dannielle Karvonen, PA-C    Allergies Amoxicillin  Family History  Problem Relation Age of Onset  . Arthritis Mother   . Cancer Mother   . Hypertension Sister   . Asthma Brother   . Dementia Brother   . Breast cancer Neg Hx   . Kidney cancer Neg Hx   . Bladder Cancer Neg Hx      Social History Social History   Tobacco Use  . Smoking status: Former Smoker    Last attempt to quit: 1978    Years since quitting: 42.1  . Smokeless tobacco: Never Used  Substance Use Topics  . Alcohol use: Not Currently    Frequency: Never  . Drug use: No    Review of Systems  Constitutional: Negative for fever. Eyes: Negative for visual changes. ENT: Positive for sore throat. Cardiovascular: Negative for chest pain. Respiratory: Negative for shortness of breath.  Reports nonproductive cough for 3 days Gastrointestinal: Negative for abdominal pain, vomiting and diarrhea. Genitourinary: Negative for dysuria. Musculoskeletal: Negative for back pain. Skin: Negative for rash. Neurological: Negative for headaches, focal weakness or numbness. ____________________________________________  PHYSICAL EXAM:  VITAL SIGNS: ED Triage Vitals  Enc Vitals Group     BP 11/05/18 1346 (!) 139/56     Pulse Rate 11/05/18 1346 66     Resp 11/05/18 1346 18     Temp 11/05/18 1346 99.7 F (37.6 C)     Temp Source 11/05/18 1346 Oral     SpO2 11/05/18 1346 96 %     Weight 11/05/18 1347 170 lb (77.1 kg)     Height 11/05/18 1347 5\' 2"  (1.575 m)     Head Circumference --      Peak Flow --      Pain Score 11/05/18 1347 8     Pain Loc --      Pain Edu? --      Excl. in [place]? --     Constitutional: Alert and oriented. Well appearing and in no distress. Head: Normocephalic and atraumatic. Eyes: Conjunctivae are normal. Normal extraocular movements Ears: Canals clear. TMs intact bilaterally. Nose: No congestion/rhinorrhea/epistaxis. Mouth/Throat: Mucous membranes are moist. Cardiovascular: Normal rate, regular rhythm. Normal distal pulses. Respiratory: Normal respiratory effort. No wheezes/rales/rhonchi. ____________________________________________   RADIOLOGY  CXR IMPRESSION: Stable cardiomegaly. No acute cardiopulmonary  abnormality. ____________________________________________  PROCEDURES  Procedures DuoNeb x 1 ____________________________________________  INITIAL IMPRESSION / ASSESSMENT AND PLAN / ED COURSE  Patient with ED evaluation of 3-day complaint of intimately productive cough and sore throat.  Patient's clinical picture concerning for bronchitis.  Her chest x-ray is reassuring and shows no acute infectious process.  Patient be discharged with a prescription for prednisone to take as directed.  She will take over-the-counter Delsym for additional cough relief.  She has an albuterol inhaler at home that she is encouraged to use at this time.  She will follow with primary provider return as needed. ____________________________________________  FINAL CLINICAL IMPRESSION(S) / ED DIAGNOSES  Final diagnoses:  Viral upper respiratory tract infection  Bronchospasm, acute      Rexine Gowens, Dannielle Karvonen, PA-C 11/05/18 1846    Nance Pear, MD 11/05/18 1944

## 2018-11-09 ENCOUNTER — Ambulatory Visit: Payer: Medicare Other

## 2019-01-23 ENCOUNTER — Other Ambulatory Visit: Payer: Self-pay

## 2019-01-24 ENCOUNTER — Encounter: Payer: Self-pay | Admitting: Internal Medicine

## 2019-01-24 ENCOUNTER — Other Ambulatory Visit: Payer: Self-pay | Admitting: *Deleted

## 2019-01-24 ENCOUNTER — Inpatient Hospital Stay: Payer: Medicare Other

## 2019-01-24 ENCOUNTER — Inpatient Hospital Stay: Payer: Medicare Other | Attending: Internal Medicine | Admitting: Internal Medicine

## 2019-01-24 ENCOUNTER — Other Ambulatory Visit: Payer: Self-pay

## 2019-01-24 VITALS — BP 131/72 | HR 66 | Temp 97.5°F | Ht 62.0 in | Wt 171.4 lb

## 2019-01-24 DIAGNOSIS — D638 Anemia in other chronic diseases classified elsewhere: Secondary | ICD-10-CM

## 2019-01-24 DIAGNOSIS — D500 Iron deficiency anemia secondary to blood loss (chronic): Secondary | ICD-10-CM

## 2019-01-24 DIAGNOSIS — M2550 Pain in unspecified joint: Secondary | ICD-10-CM | POA: Diagnosis not present

## 2019-01-24 DIAGNOSIS — D709 Neutropenia, unspecified: Secondary | ICD-10-CM

## 2019-01-24 DIAGNOSIS — D696 Thrombocytopenia, unspecified: Secondary | ICD-10-CM | POA: Insufficient documentation

## 2019-01-24 DIAGNOSIS — D649 Anemia, unspecified: Secondary | ICD-10-CM | POA: Diagnosis not present

## 2019-01-24 DIAGNOSIS — Z87891 Personal history of nicotine dependence: Secondary | ICD-10-CM | POA: Diagnosis not present

## 2019-01-24 LAB — BASIC METABOLIC PANEL
Anion gap: 10 (ref 5–15)
BUN: 42 mg/dL — ABNORMAL HIGH (ref 8–23)
CO2: 23 mmol/L (ref 22–32)
Calcium: 9.4 mg/dL (ref 8.9–10.3)
Chloride: 106 mmol/L (ref 98–111)
Creatinine, Ser: 1.21 mg/dL — ABNORMAL HIGH (ref 0.44–1.00)
GFR calc Af Amer: 56 mL/min — ABNORMAL LOW (ref >60)
GFR calc non Af Amer: 48 mL/min — ABNORMAL LOW (ref >60)
Glucose, Bld: 109 mg/dL — ABNORMAL HIGH (ref 70–99)
Potassium: 3.6 mmol/L (ref 3.5–5.1)
Sodium: 139 mmol/L (ref 135–145)

## 2019-01-24 LAB — CBC WITH DIFFERENTIAL/PLATELET
Abs Immature Granulocytes: 0.03 10*3/uL (ref 0.00–0.07)
Basophils Absolute: 0 10*3/uL (ref 0.0–0.1)
Basophils Relative: 1 %
Eosinophils Absolute: 0 10*3/uL (ref 0.0–0.5)
Eosinophils Relative: 1 %
HCT: 35.4 % — ABNORMAL LOW (ref 36.0–46.0)
Hemoglobin: 11.5 g/dL — ABNORMAL LOW (ref 12.0–15.0)
Immature Granulocytes: 1 %
Lymphocytes Relative: 21 %
Lymphs Abs: 1 10*3/uL (ref 0.7–4.0)
MCH: 27.2 pg (ref 26.0–34.0)
MCHC: 32.5 g/dL (ref 30.0–36.0)
MCV: 83.7 fL (ref 80.0–100.0)
Monocytes Absolute: 0.4 10*3/uL (ref 0.1–1.0)
Monocytes Relative: 8 %
Neutro Abs: 3.2 10*3/uL (ref 1.7–7.7)
Neutrophils Relative %: 68 %
Platelets: 144 10*3/uL — ABNORMAL LOW (ref 150–400)
RBC: 4.23 MIL/uL (ref 3.87–5.11)
RDW: 15.4 % (ref 11.5–15.5)
WBC: 4.7 10*3/uL (ref 4.0–10.5)
nRBC: 0 % (ref 0.0–0.2)

## 2019-01-24 NOTE — Assessment & Plan Note (Addendum)
#   Anemia- likely IDA vs anemia of chronic disease  [unclear etiology]- s/p IV iron; improved. Hb ~11.5- STABLE.   #Mild asymptomatic leukopenia/intermittent neutropenia-STABLE>   #Mild intermittent thrombocytopenia-today 144  Stable. ? Autoimmune process.   #Chronic joint pains/inability to ride bus; needs transportation.  Paper work for transportation/ Marlowe Kays please mail to pt.   # DISPOSITION:  # follow up in 6 months-MD/cbc/bmp- Dr.B  Cc; Dr.Johnson.

## 2019-01-24 NOTE — Progress Notes (Signed)
Patient stated that she had been doing well with no complaints. 

## 2019-01-24 NOTE — Progress Notes (Signed)
Kristina Henson CONSULT NOTE  Patient Care Team: Valerie Roys, DO as PCP - General (Family Medicine) Ubaldo Glassing Javier Docker, MD as Consulting Physician (Cardiology) Carloyn Manner, MD as Referring Physician (Otolaryngology)  CHIEF COMPLAINTS/PURPOSE OF CONSULTATION:   # Anemia of chronic disease/mild intermittent leucopenia/thromboctyopenia? Autoimmune- EGD/ [place] [Dr.Wohl; 2017] chronic back pain [? 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     HISTORY OF PRESENTING ILLNESS:  Kristina Henson 63 y.o.  female noted Moderate anemia of Unclear etiology- iron deficiency versus chronic disease is here for follow-up.  Patient denies any blood in stools or black or stools.  Denies any nausea vomiting or weight loss.  Continues to have mild joint pains.  Overall improved.  Continues 1 p.o. iron.  Review of Systems  Constitutional: Negative for chills, diaphoresis, fever, malaise/fatigue and weight loss.  HENT: Negative for nosebleeds and sore throat.   Eyes: Negative for double vision.  Respiratory: Negative for cough, hemoptysis, sputum production, shortness of breath and wheezing.   Cardiovascular: Negative for chest pain, palpitations, orthopnea and leg swelling.  Gastrointestinal: Negative for abdominal pain, blood in stool, constipation, diarrhea, heartburn, melena, nausea and vomiting.  Genitourinary: Negative for dysuria, frequency and urgency.  Musculoskeletal: Negative for back pain and joint pain.  Skin: Negative.  Negative for itching and rash.  Neurological: Negative for dizziness, tingling, focal weakness, weakness and headaches.  Endo/Heme/Allergies: Does not bruise/bleed easily.  Psychiatric/Behavioral: Negative for depression. The patient is not  nervous/anxious and does not have insomnia.      MEDICAL HISTORY:  Past Medical History:  Diagnosis Date  . Anemia   . Arthritis    BACK-LUMBAR  . Atrial fibrillation ([place])   . Coronary artery disease   . Duodenitis   . Dysrhythmia   . Gastritis   . Hypertension   . Mass in neck    lt side  . Pneumonia 2016  . Stroke ([place]) 2012  . Stroke [facility]) 2012    SURGICAL HISTORY: Past Surgical History:  Procedure Laterality Date  . ABDOMINAL HYSTERECTOMY     Total  . cardiac catherization    . COLONOSCOPY WITH PROPOFOL N/A 11/18/2015   Procedure: COLONOSCOPY WITH PROPOFOL;  Surgeon: Lucilla Lame, MD;  Location: [facility] ENDOSCOPY;  Service: Endoscopy;  Laterality: N/A;  . CORONARY ANGIOPLASTY    . ESOPHAGOGASTRODUODENOSCOPY (EGD) WITH PROPOFOL N/A 04/23/2016   Procedure: ESOPHAGOGASTRODUODENOSCOPY (EGD) WITH PROPOFOL;  Surgeon: Lucilla Lame, MD;  Location: [place];  Service: Endoscopy;  Laterality: N/A;  small bowel bx gastric antrum bx  . SUBMANDIBULAR GLAND EXCISION Left 09/08/2017   Procedure: EXCISION SUBMANDIBULAR GLAND;  Surgeon: Carloyn Manner, MD;  Location: [facility] ORS;  Service: ENT;  Laterality: Left;  . throat biopsy      SOCIAL HISTORY: Social History   Socioeconomic History  . Marital status: Divorced    Spouse name: Not on file  . Number of children: Not on file  . Years of education: 70  . Highest education level: 12th grade  Occupational History  . Not on file  Social Needs  . Financial resource strain: Not hard at all  . Food insecurity:    Worry: Never true    Inability: Never true  . Transportation needs:    Medical: No    Non-medical: No  Tobacco  Use  . Smoking status: Former Smoker    Last attempt to quit: 1978    Years since quitting: 42.3  . Smokeless tobacco: Never Used  Substance and Sexual Activity  . Alcohol use: Not Currently    Frequency: Never  . Drug use: No  . Sexual activity: Yes  Lifestyle  . Physical activity:    Days  per week: 5 days    Minutes per session: 50 min  . Stress: Not at all  Relationships  . Social connections:    Talks on phone: Once a week    Gets together: More than three times a week    Attends religious service: Never    Active member of club or organization: No    Attends meetings of clubs or organizations: Never    Relationship status: Divorced  . Intimate partner violence:    Fear of current or ex partner: No    Emotionally abused: No    Physically abused: No    Forced sexual activity: No  Other Topics Concern  . Not on file  Social History Narrative  . Not on file    FAMILY HISTORY: Family History  Problem Relation Age of Onset  . Arthritis Mother   . Cancer Mother   . Hypertension Sister   . Asthma Brother   . Dementia Brother   . Breast cancer Neg Hx   . Kidney cancer Neg Hx   . Bladder Cancer Neg Hx     ALLERGIES:  is allergic to amoxicillin.  MEDICATIONS:  Current Outpatient Medications  Medication Sig Dispense Refill  . amiodarone (PACERONE) 200 MG tablet Take 1 tablet (200 mg total) by mouth every morning. 30 tablet 0  . amLODipine (NORVASC) 5 MG tablet Take 1 tablet (5 mg total) by mouth daily. 90 tablet 1  . Ascorbic Acid (VITAMIN C) 100 MG tablet Take 100 mg by mouth daily.    Marland Kitchen aspirin EC 81 MG tablet Take 81 mg by mouth daily.     Marland Kitchen CRANBERRY PO Take 1 tablet by mouth daily.    . ferrous sulfate 325 (65 FE) MG EC tablet Take 1 tablet (325 mg total) by mouth 3 (three) times daily with meals. 270 tablet 1  . hydrALAZINE (APRESOLINE) 50 MG tablet Take 1 tablet (50 mg total) by mouth 2 (two) times daily. 180 tablet 1  . lisinopril-hydrochlorothiazide (PRINZIDE,ZESTORETIC) 20-25 MG tablet Take 1 tablet by mouth 2 (two) times daily. 180 tablet 1  . Multiple Vitamins-Minerals (MULTIVITAMIN WITH MINERALS) tablet Take 1 tablet by mouth daily.     No current facility-administered medications for this visit.       Marland Kitchen  PHYSICAL EXAMINATION: ECOG  PERFORMANCE STATUS: 1 - Symptomatic but completely ambulatory  Vitals:   01/24/19 1402  BP: 131/72  Pulse: 66  Temp: (!) 97.5 F (36.4 C)   Filed Weights   01/24/19 1402  Weight: 171 lb 6.4 oz (77.7 kg)    Physical Exam  Constitutional: Kristina Henson is oriented to person, place, and time and well-developed, well-nourished, and in no distress.  HENT:  Head: Normocephalic and atraumatic.  Mouth/Throat: Oropharynx is clear and moist. No oropharyngeal exudate.  Eyes: Pupils are equal, round, and reactive to light.  Neck: Normal range of motion. Neck supple.  Cardiovascular: Normal rate and regular rhythm.  Pulmonary/Chest: No respiratory distress. Kristina Henson has no wheezes.  Abdominal: Soft. Bowel sounds are normal. Kristina Henson exhibits no distension and no mass. There is no abdominal tenderness. There is no  rebound and no guarding.  Musculoskeletal: Normal range of motion.        General: No tenderness or edema.  Neurological: Kristina Henson is alert and oriented to person, place, and time.  Skin: Skin is warm.  Psychiatric: Affect normal.     LABORATORY DATA:  I have reviewed the data as listed Lab Results  Component Value Date   WBC 4.7 01/24/2019   HGB 11.5 (L) 01/24/2019   HCT 35.4 (L) 01/24/2019   MCV 83.7 01/24/2019   PLT 144 (L) 01/24/2019   Recent Labs    04/03/18 1018 10/16/18 1503 01/24/19 1344  NA 142 142 139  K 3.8 3.9 3.6  CL 101 103 106  CO2 24 24 23   GLUCOSE 102* 97 109*  BUN 25 25 42*  CREATININE 1.03* 0.91 1.21*  CALCIUM 9.8 9.6 9.4  GFRNONAA 59* 69 48*  GFRAA 68 79 56*  PROT 7.0 7.1  --   ALBUMIN 3.9 4.0  --   AST 18 26  --   ALT 23 35*  --   ALKPHOS 87 95  --   BILITOT 0.4 0.5  --     RADIOGRAPHIC STUDIES: I have personally reviewed the radiological images as listed and agreed with the findings in the report. No results found.  ASSESSMENT & PLAN:   Anemia due to chronic illness # Anemia- likely IDA vs anemia of chronic disease  [unclear etiology]- s/p IV iron;  improved. Hb ~11.5- STABLE.   #Mild asymptomatic leukopenia/intermittent neutropenia-STABLE>   #Mild intermittent thrombocytopenia-today 144  Stable. ? Autoimmune process.   #Chronic joint pains/inability to ride bus; needs transportation.  Paper work for transportation/ Marlowe Kays please mail to pt.   # DISPOSITION:  # follow up in 6 months-MD/cbc/bmp- Dr.B  Cc; Dr.Johnson.   All questions were answered. The patient knows to call the clinic with any problems, questions or concerns.     Cammie Sickle, MD 01/24/2019 4:01 PM

## 2019-02-08 ENCOUNTER — Telehealth: Payer: Self-pay | Admitting: *Deleted

## 2019-02-08 NOTE — Telephone Encounter (Signed)
Patient notified that her paperwork for transportation has been completed. She asked that I mail this information to her.

## 2019-03-12 ENCOUNTER — Telehealth: Payer: Self-pay | Admitting: Family Medicine

## 2019-03-12 NOTE — Chronic Care Management (AMB) (Signed)
Chronic Care Management   Note  03/12/2019 Name: TENLEY WINWARD MRN: [id number] DOB: [birth year]  ADITHI GAMMON is a 63 y.o. year old female who is a primary care patient of Valerie Roys, DO. I reached out to [name] by phone today in response to a referral sent by Ms. Janalyn Shy [place]'s health plan.    Ms. Treiber was given information about Chronic Care Management services today including:  1. CCM service includes personalized support from designated clinical staff supervised by her physician, including individualized plan of care and coordination with other care providers 2. 24/7 contact phone numbers for assistance for urgent and routine care needs. 3. Service will only be billed when office clinical staff spend 20 minutes or more in a month to coordinate care. 4. Only one practitioner may furnish and bill the service in a calendar month. 5. The patient may stop CCM services at any time (effective at the end of the month) by phone call to the office staff. 6. The patient will be responsible for cost sharing (co-pay) of up to 20% of the service fee (after annual deductible is met).  Patient agreed to services and verbal consent obtained.   Follow up plan: Telephone appointment with CCM team member scheduled for: 03/27/2019  [place]  ??[email or web address]   ??[phone]

## 2019-03-27 ENCOUNTER — Telehealth: Payer: Medicare Other

## 2019-04-03 DIAGNOSIS — I10 Essential (primary) hypertension: Secondary | ICD-10-CM | POA: Diagnosis not present

## 2019-04-03 DIAGNOSIS — I480 Paroxysmal atrial fibrillation: Secondary | ICD-10-CM | POA: Diagnosis not present

## 2019-04-03 DIAGNOSIS — E7801 Familial hypercholesterolemia: Secondary | ICD-10-CM | POA: Diagnosis not present

## 2019-04-03 DIAGNOSIS — I639 Cerebral infarction, unspecified: Secondary | ICD-10-CM | POA: Diagnosis not present

## 2019-04-16 ENCOUNTER — Ambulatory Visit: Payer: Medicare Other | Admitting: Family Medicine

## 2019-04-27 ENCOUNTER — Other Ambulatory Visit: Payer: Self-pay

## 2019-04-27 ENCOUNTER — Ambulatory Visit (INDEPENDENT_AMBULATORY_CARE_PROVIDER_SITE_OTHER): Payer: Medicare Other | Admitting: Family Medicine

## 2019-04-27 ENCOUNTER — Encounter: Payer: Self-pay | Admitting: Family Medicine

## 2019-04-27 VITALS — BP 111/72 | HR 64 | Temp 98.1°F

## 2019-04-27 DIAGNOSIS — D500 Iron deficiency anemia secondary to blood loss (chronic): Secondary | ICD-10-CM | POA: Diagnosis not present

## 2019-04-27 DIAGNOSIS — I129 Hypertensive chronic kidney disease with stage 1 through stage 4 chronic kidney disease, or unspecified chronic kidney disease: Secondary | ICD-10-CM

## 2019-04-27 DIAGNOSIS — E782 Mixed hyperlipidemia: Secondary | ICD-10-CM | POA: Diagnosis not present

## 2019-04-27 LAB — MICROALBUMIN, URINE WAIVED
Creatinine, Urine Waived: 50 mg/dL (ref 10–300)
Microalb, Ur Waived: 30 mg/L — ABNORMAL HIGH (ref 0–19)

## 2019-04-27 MED ORDER — FERROUS SULFATE 325 (65 FE) MG PO TBEC: 325.0000 mg | DELAYED_RELEASE_TABLET | Freq: Three times a day (TID) | ORAL | 1 refills | Status: DC

## 2019-04-27 MED ORDER — LISINOPRIL-HYDROCHLOROTHIAZIDE 20-25 MG PO TABS: 1.0000 | ORAL_TABLET | Freq: Two times a day (BID) | ORAL | 1 refills | Status: DC

## 2019-04-27 MED ORDER — AMLODIPINE BESYLATE 5 MG PO TABS: 5.0000 mg | ORAL_TABLET | Freq: Every day | ORAL | 1 refills | Status: DC

## 2019-04-27 MED ORDER — HYDRALAZINE HCL 50 MG PO TABS: 50.0000 mg | ORAL_TABLET | Freq: Two times a day (BID) | ORAL | 1 refills | Status: DC

## 2019-04-27 NOTE — Assessment & Plan Note (Signed)
Under good control on current regimen. Continue current regimen. Continue to monitor. Call with any concerns. Refills given. Labs drawn today.   

## 2019-04-27 NOTE — Progress Notes (Signed)
BP 111/72   Pulse 64   Temp 98.1 F (36.7 C)   SpO2 97%    Subjective:    Patient ID: Kristina Henson, female    DOB: [birth year], 63 y.o.   MRN: [id number]  HPI: Kristina Henson is a 63 y.o. female  Chief Complaint  Patient presents with  . Hypertension  . Hyperlipidemia  . Anemia   HYPERTENSION / [place] Satisfied with current treatment? yes Duration of hypertension: chronic BP monitoring frequency: not checking BP medication side effects: no Past BP meds: hydralazine, amlodipine, lisinopril-hctz Duration of hyperlipidemia: chronic Cholesterol medication side effects: not on any Cholesterol supplements: none Past cholesterol medications: none Medication compliance: excellent compliance Aspirin: yes Recent stressors: no Recurrent headaches: no Visual changes: no Palpitations: no Dyspnea: no Chest pain: no Lower extremity edema: no Dizzy/lightheaded: no  ANEMIA Anemia status: controlled Etiology of anemia: iron def. Duration of anemia treatment: chronic  Compliance with treatment: excellent compliance Iron supplementation side effects: no Severity of anemia: mild Fatigue: no Decreased exercise tolerance: no  Dyspnea on exertion: no Palpitations: no Bleeding: no Pica: no  Relevant past medical, surgical, family and social history reviewed and updated as indicated. Interim medical history since our last visit reviewed. Allergies and medications reviewed and updated.  Review of Systems  Constitutional: Negative.   Respiratory: Negative.   Cardiovascular: Negative.   Neurological: Negative.   Psychiatric/Behavioral: Negative.     Per HPI unless specifically indicated above     Objective:    BP 111/72   Pulse 64   Temp 98.1 F (36.7 C)   SpO2 97%   Wt Readings from Last 3 Encounters:  01/24/19 171 lb 6.4 oz (77.7 kg)  11/05/18 170 lb (77.1 kg)  10/16/18 171 lb 8 oz (77.8 kg)    Physical Exam Vitals signs and nursing note reviewed.   Constitutional:      General: She is not in acute distress.    Appearance: Normal appearance. She is not ill-appearing, toxic-appearing or diaphoretic.  HENT:     Head: Normocephalic and atraumatic.     Right Ear: External ear normal.     Left Ear: External ear normal.     Nose: Nose normal.     Mouth/Throat:     Mouth: Mucous membranes are moist.     Pharynx: Oropharynx is clear.  Eyes:     General: No scleral icterus.       Right eye: No discharge.        Left eye: No discharge.     Extraocular Movements: Extraocular movements intact.     Conjunctiva/sclera: Conjunctivae normal.     Pupils: Pupils are equal, round, and reactive to light.  Neck:     Musculoskeletal: Normal range of motion and neck supple.  Cardiovascular:     Rate and Rhythm: Normal rate and regular rhythm.     Pulses: Normal pulses.     Heart sounds: Normal heart sounds. No murmur. No friction rub. No gallop.   Pulmonary:     Effort: Pulmonary effort is normal. No respiratory distress.     Breath sounds: Normal breath sounds. No stridor. No wheezing, rhonchi or rales.  Chest:     Chest wall: No tenderness.  Musculoskeletal: Normal range of motion.  Skin:    General: Skin is warm and dry.     Capillary Refill: Capillary refill takes less than 2 seconds.     Coloration: Skin is not jaundiced or pale.  Findings: No bruising, erythema, lesion or rash.  Neurological:     General: No focal deficit present.     Mental Status: She is alert and oriented to person, place, and time. Mental status is at baseline.  Psychiatric:        Mood and Affect: Mood normal.        Behavior: Behavior normal.        Thought Content: Thought content normal.        Judgment: Judgment normal.     Results for orders placed or performed in visit on 01/24/19  CBC with Differential  Result Value Ref Range   WBC 4.7 4.0 - 10.5 K/uL   RBC 4.23 3.87 - 5.11 MIL/uL   Hemoglobin 11.5 (L) 12.0 - 15.0 g/dL   HCT 35.4 (L) 36.0 -  46.0 %   MCV 83.7 80.0 - 100.0 fL   MCH 27.2 26.0 - 34.0 pg   MCHC 32.5 30.0 - 36.0 g/dL   RDW 15.4 11.5 - 15.5 %   Platelets 144 (L) 150 - 400 K/uL   nRBC 0.0 0.0 - 0.2 %   Neutrophils Relative % 68 %   Neutro Abs 3.2 1.7 - 7.7 K/uL   Lymphocytes Relative 21 %   Lymphs Abs 1.0 0.7 - 4.0 K/uL   Monocytes Relative 8 %   Monocytes Absolute 0.4 0.1 - 1.0 K/uL   Eosinophils Relative 1 %   Eosinophils Absolute 0.0 0.0 - 0.5 K/uL   Basophils Relative 1 %   Basophils Absolute 0.0 0.0 - 0.1 K/uL   Immature Granulocytes 1 %   Abs Immature Granulocytes 0.03 0.00 - 0.07 K/uL  Basic metabolic panel  Result Value Ref Range   Sodium 139 135 - 145 mmol/L   Potassium 3.6 3.5 - 5.1 mmol/L   Chloride 106 98 - 111 mmol/L   CO2 23 22 - 32 mmol/L   Glucose, Bld 109 (H) 70 - 99 mg/dL   BUN 42 (H) 8 - 23 mg/dL   Creatinine, Ser 1.21 (H) 0.44 - 1.00 mg/dL   Calcium 9.4 8.9 - 10.3 mg/dL   GFR calc non Af Amer 48 (L) >60 mL/min   GFR calc Af Amer 56 (L) >60 mL/min   Anion gap 10 5 - 15      Assessment & Plan:   Problem List Items Addressed This Visit      Genitourinary   Benign hypertensive renal disease - Primary    Under good control on current regimen. Continue current regimen. Continue to monitor. Call with any concerns. Refills given. Labs drawn today.       Relevant Orders   Comprehensive metabolic panel   Microalbumin, Urine Waived     Other   HLD (hyperlipidemia)    Under good control on current regimen. Continue current regimen. Continue to monitor. Call with any concerns. Refills given. Labs drawn today.      Relevant Medications   amLODipine (NORVASC) 5 MG tablet   hydrALAZINE (APRESOLINE) 50 MG tablet   lisinopril-hydrochlorothiazide (ZESTORETIC) 20-25 MG tablet   Other Relevant Orders   Comprehensive metabolic panel   Lipid Panel w/o Chol/HDL Ratio   Iron deficiency anemia due to chronic blood loss    Under good control on current regimen. Continue current regimen.  Continue to monitor. Call with any concerns. Refills given. Labs drawn today.      Relevant Medications   ferrous sulfate 325 (65 FE) MG EC tablet   Other Relevant Orders  Comprehensive metabolic panel   CBC with Differential/Platelet   Iron and TIBC   Ferritin       Follow up plan: Return in about 6 months (around 10/28/2019) for wellness/physical.

## 2019-04-28 LAB — CBC WITH DIFFERENTIAL/PLATELET
Basophils Absolute: 0 10*3/uL (ref 0.0–0.2)
Basos: 1 %
EOS (ABSOLUTE): 0 10*3/uL (ref 0.0–0.4)
Eos: 1 %
Hematocrit: 37.4 % (ref 34.0–46.6)
Hemoglobin: 12 g/dL (ref 11.1–15.9)
Immature Grans (Abs): 0 10*3/uL (ref 0.0–0.1)
Immature Granulocytes: 1 %
Lymphocytes Absolute: 0.6 10*3/uL — ABNORMAL LOW (ref 0.7–3.1)
Lymphs: 17 %
MCH: 26.1 pg — ABNORMAL LOW (ref 26.6–33.0)
MCHC: 32.1 g/dL (ref 31.5–35.7)
MCV: 81 fL (ref 79–97)
Monocytes Absolute: 0.2 10*3/uL (ref 0.1–0.9)
Monocytes: 6 %
Neutrophils Absolute: 2.6 10*3/uL (ref 1.4–7.0)
Neutrophils: 74 %
Platelets: 132 10*3/uL — ABNORMAL LOW (ref 150–450)
RBC: 4.6 x10E6/uL (ref 3.77–5.28)
RDW: 15.9 % — ABNORMAL HIGH (ref 11.7–15.4)
WBC: 3.5 10*3/uL (ref 3.4–10.8)

## 2019-04-28 LAB — COMPREHENSIVE METABOLIC PANEL
ALT: 53 IU/L — ABNORMAL HIGH (ref 0–32)
AST: 32 IU/L (ref 0–40)
Albumin/Globulin Ratio: 1.4 (ref 1.2–2.2)
Albumin: 4.3 g/dL (ref 3.8–4.8)
Alkaline Phosphatase: 88 IU/L (ref 39–117)
BUN/Creatinine Ratio: 27 (ref 12–28)
BUN: 25 mg/dL (ref 8–27)
Bilirubin Total: 0.4 mg/dL (ref 0.0–1.2)
CO2: 24 mmol/L (ref 20–29)
Calcium: 9.5 mg/dL (ref 8.7–10.3)
Chloride: 99 mmol/L (ref 96–106)
Creatinine, Ser: 0.93 mg/dL (ref 0.57–1.00)
GFR calc Af Amer: 77 mL/min/{1.73_m2} (ref >59)
GFR calc non Af Amer: 67 mL/min/{1.73_m2} (ref >59)
Globulin, Total: 3 g/dL (ref 1.5–4.5)
Glucose: 109 mg/dL — ABNORMAL HIGH (ref 65–99)
Potassium: 3.8 mmol/L (ref 3.5–5.2)
Sodium: 141 mmol/L (ref 134–144)
Total Protein: 7.3 g/dL (ref 6.0–8.5)

## 2019-04-28 LAB — IRON AND TIBC
Iron Saturation: 19 % (ref 15–55)
Iron: 48 ug/dL (ref 27–139)
Total Iron Binding Capacity: 247 ug/dL — ABNORMAL LOW (ref 250–450)
UIBC: 199 ug/dL (ref 118–369)

## 2019-04-28 LAB — FERRITIN: Ferritin: 299 ng/mL — ABNORMAL HIGH (ref 15–150)

## 2019-04-28 LAB — LIPID PANEL W/O CHOL/HDL RATIO
Cholesterol, Total: 184 mg/dL (ref 100–199)
HDL: 43 mg/dL (ref >39)
LDL Calculated: 117 mg/dL — ABNORMAL HIGH (ref 0–99)
Triglycerides: 119 mg/dL (ref 0–149)
VLDL Cholesterol Cal: 24 mg/dL (ref 5–40)

## 2019-04-29 ENCOUNTER — Encounter: Payer: Self-pay | Admitting: Family Medicine

## 2019-07-05 DIAGNOSIS — H2513 Age-related nuclear cataract, bilateral: Secondary | ICD-10-CM | POA: Diagnosis not present

## 2019-07-05 DIAGNOSIS — H5203 Hypermetropia, bilateral: Secondary | ICD-10-CM | POA: Diagnosis not present

## 2019-07-05 DIAGNOSIS — H353111 Nonexudative age-related macular degeneration, right eye, early dry stage: Secondary | ICD-10-CM | POA: Diagnosis not present

## 2019-07-05 DIAGNOSIS — H524 Presbyopia: Secondary | ICD-10-CM | POA: Diagnosis not present

## 2019-07-05 DIAGNOSIS — H52223 Regular astigmatism, bilateral: Secondary | ICD-10-CM | POA: Diagnosis not present

## 2019-07-08 DIAGNOSIS — Z23 Encounter for immunization: Secondary | ICD-10-CM | POA: Diagnosis not present

## 2019-07-26 ENCOUNTER — Inpatient Hospital Stay: Payer: Medicare Other

## 2019-07-26 ENCOUNTER — Other Ambulatory Visit: Payer: Self-pay | Admitting: *Deleted

## 2019-07-26 ENCOUNTER — Inpatient Hospital Stay: Payer: Medicare Other | Attending: Oncology

## 2019-07-26 ENCOUNTER — Inpatient Hospital Stay: Payer: Medicare Other | Admitting: Oncology

## 2019-07-26 ENCOUNTER — Other Ambulatory Visit: Payer: Self-pay

## 2019-07-26 ENCOUNTER — Inpatient Hospital Stay (HOSPITAL_BASED_OUTPATIENT_CLINIC_OR_DEPARTMENT_OTHER): Payer: Medicare Other | Admitting: Oncology

## 2019-07-26 DIAGNOSIS — D649 Anemia, unspecified: Secondary | ICD-10-CM | POA: Diagnosis not present

## 2019-07-26 DIAGNOSIS — D638 Anemia in other chronic diseases classified elsewhere: Secondary | ICD-10-CM

## 2019-07-26 DIAGNOSIS — Z87891 Personal history of nicotine dependence: Secondary | ICD-10-CM | POA: Insufficient documentation

## 2019-07-26 DIAGNOSIS — Z79899 Other long term (current) drug therapy: Secondary | ICD-10-CM | POA: Diagnosis not present

## 2019-07-26 DIAGNOSIS — I10 Essential (primary) hypertension: Secondary | ICD-10-CM | POA: Insufficient documentation

## 2019-07-26 DIAGNOSIS — D72819 Decreased white blood cell count, unspecified: Secondary | ICD-10-CM | POA: Insufficient documentation

## 2019-07-26 DIAGNOSIS — Z7982 Long term (current) use of aspirin: Secondary | ICD-10-CM | POA: Insufficient documentation

## 2019-07-26 DIAGNOSIS — I4891 Unspecified atrial fibrillation: Secondary | ICD-10-CM | POA: Insufficient documentation

## 2019-07-26 DIAGNOSIS — Z8673 Personal history of transient ischemic attack (TIA), and cerebral infarction without residual deficits: Secondary | ICD-10-CM | POA: Insufficient documentation

## 2019-07-26 LAB — FERRITIN: Ferritin: 179 ng/mL (ref 11–307)

## 2019-07-26 LAB — BASIC METABOLIC PANEL
Anion gap: 11 (ref 5–15)
BUN: 29 mg/dL — ABNORMAL HIGH (ref 8–23)
CO2: 27 mmol/L (ref 22–32)
Calcium: 9.5 mg/dL (ref 8.9–10.3)
Chloride: 104 mmol/L (ref 98–111)
Creatinine, Ser: 0.81 mg/dL (ref 0.44–1.00)
GFR calc Af Amer: >60 mL/min (ref >60)
GFR calc non Af Amer: >60 mL/min (ref >60)
Glucose, Bld: 100 mg/dL — ABNORMAL HIGH (ref 70–99)
Potassium: 3.5 mmol/L (ref 3.5–5.1)
Sodium: 142 mmol/L (ref 135–145)

## 2019-07-26 LAB — CBC WITH DIFFERENTIAL/PLATELET
Abs Immature Granulocytes: 0.03 10*3/uL (ref 0.00–0.07)
Basophils Absolute: 0 10*3/uL (ref 0.0–0.1)
Basophils Relative: 1 %
Eosinophils Absolute: 0.1 10*3/uL (ref 0.0–0.5)
Eosinophils Relative: 2 %
HCT: 35.2 % — ABNORMAL LOW (ref 36.0–46.0)
Hemoglobin: 11.1 g/dL — ABNORMAL LOW (ref 12.0–15.0)
Immature Granulocytes: 1 %
Lymphocytes Relative: 18 %
Lymphs Abs: 0.6 10*3/uL — ABNORMAL LOW (ref 0.7–4.0)
MCH: 26.8 pg (ref 26.0–34.0)
MCHC: 31.5 g/dL (ref 30.0–36.0)
MCV: 85 fL (ref 80.0–100.0)
Monocytes Absolute: 0.3 10*3/uL (ref 0.1–1.0)
Monocytes Relative: 8 %
Neutro Abs: 2.5 10*3/uL (ref 1.7–7.7)
Neutrophils Relative %: 70 %
Platelets: 120 10*3/uL — ABNORMAL LOW (ref 150–400)
RBC: 4.14 MIL/uL (ref 3.87–5.11)
RDW: 15.6 % — ABNORMAL HIGH (ref 11.5–15.5)
WBC: 3.5 10*3/uL — ABNORMAL LOW (ref 4.0–10.5)
nRBC: 0 % (ref 0.0–0.2)

## 2019-07-26 LAB — IRON AND TIBC
Iron: 49 ug/dL (ref 28–170)
Saturation Ratios: 17 % (ref 10.4–31.8)
TIBC: 284 ug/dL (ref 250–450)
UIBC: 235 ug/dL

## 2019-07-26 NOTE — Assessment & Plan Note (Addendum)
#   Anemia- likely IDA vs anemia of chronic disease  [unclear etiology]- s/p IV iron; improved. Hb ~11.1- STABLE.   #Mild asymptomatic leukopenia/intermittent neutropenia-STABLE>   #Mild intermittent thrombocytopenia-today 120  Stable. ? Autoimmune process.   #Chronic joint pains/inability to ride bus; needs transportation.  Paper work for transportation/ Marlowe Kays please mail to pt.   # DISPOSITION:  # follow up in 6 months-MD/cbc/bmp

## 2019-07-26 NOTE — Progress Notes (Signed)
[employer] CONSULT NOTE  Patient Care Team: Valerie Roys, DO as PCP - General (Family Medicine) Ubaldo Glassing Javier Docker, MD as Consulting Physician (Cardiology) Carloyn Manner, MD as Referring Physician (Otolaryngology) Minor, Dalbert Garnet, RN as Index Management  CHIEF COMPLAINTS/PURPOSE OF CONSULTATION:   # Anemia of chronic disease/mild intermittent leucopenia/thromboctyopenia? Autoimmune- EGD/ [place] [Dr.Wohl; 2017] chronic back pain [? 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    HISTORY OF PRESENTING ILLNESS:  Kristina Henson 63 y.o.  female noted Moderate anemia of Unclear etiology- iron deficiency versus chronic disease is here for follow-up.  Patient denies any blood in stools or black or stools.  Denies any nausea vomiting or weight loss.  She denies any concerns today. Overall improved.  Continues 1 p.o. iron daily.  Review of Systems  Constitutional: Negative for chills, diaphoresis, fever, malaise/fatigue and weight loss.  HENT: Negative for nosebleeds and sore throat.   Eyes: Negative for double vision.  Respiratory: Negative for cough, hemoptysis, sputum production, shortness of breath and wheezing.   Cardiovascular: Negative for chest pain, palpitations, orthopnea and leg swelling.  Gastrointestinal: Negative for abdominal pain, blood in stool, constipation, diarrhea, heartburn, melena, nausea and vomiting.  Genitourinary: Negative for dysuria, frequency and urgency.  Musculoskeletal: Negative for back pain and joint pain.  Skin: Negative.  Negative for itching and rash.  Neurological: Negative for dizziness, tingling, focal weakness, weakness and headaches.  Endo/Heme/Allergies: Does not bruise/bleed  easily.  Psychiatric/Behavioral: Negative for depression. The patient is not nervous/anxious and does not have insomnia.      MEDICAL HISTORY:  Past Medical History:  Diagnosis Date  . Anemia   . Arthritis    BACK-LUMBAR  . Atrial fibrillation ([place])   . Coronary artery disease   . Duodenitis   . Dysrhythmia   . Gastritis   . Hypertension   . Mass in neck    lt side  . Pneumonia 2016  . Stroke ([place]) 2012  . Stroke [facility]) 2012    SURGICAL HISTORY: Past Surgical History:  Procedure Laterality Date  . ABDOMINAL HYSTERECTOMY     Total  . cardiac catherization    . COLONOSCOPY WITH PROPOFOL N/A 11/18/2015   Procedure: COLONOSCOPY WITH PROPOFOL;  Surgeon: Lucilla Lame, MD;  Location: [facility] ENDOSCOPY;  Service: Endoscopy;  Laterality: N/A;  . CORONARY ANGIOPLASTY    . ESOPHAGOGASTRODUODENOSCOPY (EGD) WITH PROPOFOL N/A 04/23/2016   Procedure: ESOPHAGOGASTRODUODENOSCOPY (EGD) WITH PROPOFOL;  Surgeon: Lucilla Lame, MD;  Location: [place];  Service: Endoscopy;  Laterality: N/A;  small bowel bx gastric antrum bx  . SUBMANDIBULAR GLAND EXCISION Left 09/08/2017   Procedure: EXCISION SUBMANDIBULAR GLAND;  Surgeon: Carloyn Manner, MD;  Location: [facility] ORS;  Service: ENT;  Laterality: Left;  . throat biopsy      SOCIAL HISTORY: Social History   Socioeconomic History  . Marital status: Divorced    Spouse name: Not on file  . Number of children: Not on file  . Years of education: 61  . Highest education level: 12th grade  Occupational History  . Not on file  Social Needs  . Financial resource strain: Not hard at all  . Food insecurity    Worry: Never true    Inability: Never true  . Transportation needs  Medical: No    Non-medical: No  Tobacco Use  . Smoking status: Former Smoker    Quit date: 1978    Years since quitting: 42.8  . Smokeless tobacco: Never Used  Substance and Sexual Activity  . Alcohol use: Not Currently    Frequency: Never  . Drug use: No  .  Sexual activity: Yes  Lifestyle  . Physical activity    Days per week: 5 days    Minutes per session: 50 min  . Stress: Not at all  Relationships  . Social Herbalist on phone: Once a week    Gets together: More than three times a week    Attends religious service: Never    Active member of club or organization: No    Attends meetings of clubs or organizations: Never    Relationship status: Divorced  . Intimate partner violence    Fear of current or ex partner: No    Emotionally abused: No    Physically abused: No    Forced sexual activity: No  Other Topics Concern  . Not on file  Social History Narrative  . Not on file    FAMILY HISTORY: Family History  Problem Relation Age of Onset  . Arthritis Mother   . Cancer Mother   . Hypertension Sister   . Asthma Brother   . Dementia Brother   . Breast cancer Neg Hx   . Kidney cancer Neg Hx   . Bladder Cancer Neg Hx     ALLERGIES:  is allergic to amoxicillin.  MEDICATIONS:  Current Outpatient Medications  Medication Sig Dispense Refill  . amiodarone (PACERONE) 200 MG tablet Take 1 tablet (200 mg total) by mouth every morning. 30 tablet 0  . amLODipine (NORVASC) 5 MG tablet Take 1 tablet (5 mg total) by mouth daily. 90 tablet 1  . Ascorbic Acid (VITAMIN C) 100 MG tablet Take 100 mg by mouth daily.    Marland Kitchen aspirin EC 81 MG tablet Take 81 mg by mouth daily.     Marland Kitchen CRANBERRY PO Take 1 tablet by mouth daily.    . ferrous sulfate 325 (65 FE) MG EC tablet Take 1 tablet (325 mg total) by mouth 3 (three) times daily with meals. 270 tablet 1  . hydrALAZINE (APRESOLINE) 50 MG tablet Take 1 tablet (50 mg total) by mouth 2 (two) times daily. 180 tablet 1  . lisinopril-hydrochlorothiazide (ZESTORETIC) 20-25 MG tablet Take 1 tablet by mouth 2 (two) times daily. 180 tablet 1  . Multiple Vitamins-Minerals (MULTIVITAMIN WITH MINERALS) tablet Take 1 tablet by mouth daily.     No current facility-administered medications for this  visit.       Marland Kitchen  PHYSICAL EXAMINATION: ECOG PERFORMANCE STATUS: 1 - Symptomatic but completely ambulatory  Vitals:   07/26/19 1023  BP: 134/76  Pulse: 63  Resp: 18  Temp: 98.7 F (37.1 C)   Filed Weights   07/26/19 1023  Weight: 169 lb 12.8 oz (77 kg)    Physical Exam  Constitutional: She is oriented to person, place, and time and well-developed, well-nourished, and in no distress. Vital signs are normal.  HENT:  Head: Normocephalic and atraumatic.  Eyes: Pupils are equal, round, and reactive to light.  Neck: Normal range of motion.  Cardiovascular: Normal rate, regular rhythm and normal heart sounds.  No murmur heard. Pulmonary/Chest: Effort normal and breath sounds normal. She has no wheezes.  Abdominal: Soft. Normal appearance and bowel sounds are normal. She exhibits  no distension. There is no abdominal tenderness.  Musculoskeletal: Normal range of motion.        General: No edema.  Neurological: She is alert and oriented to person, place, and time. Gait normal.  Skin: Skin is warm and dry. No rash noted.  Psychiatric: Mood, memory, affect and judgment normal.     LABORATORY DATA:  I have reviewed the data as listed Lab Results  Component Value Date   WBC 3.5 (L) 07/26/2019   HGB 11.1 (L) 07/26/2019   HCT 35.2 (L) 07/26/2019   MCV 85.0 07/26/2019   PLT 120 (L) 07/26/2019   Recent Labs    10/16/18 1503 01/24/19 1344 04/27/19 1056 07/26/19 0951  NA 142 139 141 142  K 3.9 3.6 3.8 3.5  CL 103 106 99 104  CO2 24 23 24 27   GLUCOSE 97 109* 109* 100*  BUN 25 42* 25 29*  CREATININE 0.91 1.21* 0.93 0.81  CALCIUM 9.6 9.4 9.5 9.5  GFRNONAA 69 48* 67 >60  GFRAA 79 56* 77 >60  PROT 7.1  --  7.3  --   ALBUMIN 4.0  --  4.3  --   AST 26  --  32  --   ALT 35*  --  53*  --   ALKPHOS 95  --  88  --   BILITOT 0.5  --  0.4  --     RADIOGRAPHIC STUDIES: I have personally reviewed the radiological images as listed and agreed with the findings in the report. No  results found.  ASSESSMENT & PLAN:   Anemia due to chronic illness # Anemia- likely IDA vs anemia of chronic disease  [unclear etiology]- s/p IV iron; improved. Hb ~11.1- STABLE.   #Mild asymptomatic leukopenia/intermittent neutropenia-STABLE>   #Mild intermittent thrombocytopenia-today 120  Stable. ? Autoimmune process.   #Chronic joint pains/inability to ride bus; needs transportation.  Paper work for transportation/ Marlowe Kays please mail to pt.   # DISPOSITION:  # follow up in 6 months-MD/cbc/bmp    All questions were answered. The patient knows to call the clinic with any problems, questions or concerns.     Jacquelin Hawking, NP 07/26/2019 10:46 AM

## 2019-08-09 DIAGNOSIS — Z20828 Contact with and (suspected) exposure to other viral communicable diseases: Secondary | ICD-10-CM | POA: Diagnosis not present

## 2019-08-20 ENCOUNTER — Other Ambulatory Visit: Payer: Self-pay | Admitting: Family Medicine

## 2019-08-20 DIAGNOSIS — Z1231 Encounter for screening mammogram for malignant neoplasm of breast: Secondary | ICD-10-CM

## 2019-10-12 ENCOUNTER — Ambulatory Visit
Admission: RE | Admit: 2019-10-12 | Discharge: 2019-10-12 | Disposition: A | Discharge status: Home / Self Care | Payer: Medicare Other | Source: Ambulatory Visit | Attending: Family Medicine | Admitting: Family Medicine

## 2019-10-12 ENCOUNTER — Encounter: Payer: Self-pay | Admitting: Family Medicine

## 2019-10-12 DIAGNOSIS — Z1231 Encounter for screening mammogram for malignant neoplasm of breast: Secondary | ICD-10-CM | POA: Diagnosis not present

## 2019-10-29 ENCOUNTER — Other Ambulatory Visit: Payer: Self-pay | Admitting: Family Medicine

## 2019-10-29 MED ORDER — AMLODIPINE BESYLATE 5 MG PO TABS: 5.0000 mg | ORAL_TABLET | Freq: Every day | ORAL | 1 refills | Status: DC

## 2019-10-29 NOTE — Telephone Encounter (Signed)
Pt is requesting a refill for :  lisinopril-hydrochlorothiazide (ZESTORETIC) 20-25 MG tablet    amLODipine (NORVASC) 5 MG tablet     Pharmacy: [facility] [address] ([address]), [place] [address]  [place], [place] ([place])  [id number]

## 2019-10-29 NOTE — Telephone Encounter (Signed)
Spoke with patient to see if she had enough medication to get her to her appointment on 02/05. Patient has enough lisinopril/hctz but needs a refill on the amlodipine.

## 2019-10-31 ENCOUNTER — Ambulatory Visit (INDEPENDENT_AMBULATORY_CARE_PROVIDER_SITE_OTHER): Payer: Medicare Other

## 2019-10-31 ENCOUNTER — Ambulatory Visit: Payer: Medicare Other | Admitting: Family Medicine

## 2019-10-31 DIAGNOSIS — Z0000 Encounter for general adult medical examination without abnormal findings: Secondary | ICD-10-CM | POA: Diagnosis not present

## 2019-10-31 NOTE — Patient Instructions (Signed)
[name] , Thank you for taking time to come for your Medicare Wellness Visit. I appreciate your ongoing commitment to your health goals. Please review the following plan we discussed and let me know if I can assist you in the future.   Screening recommendations/referrals: Colonoscopy: completed 2017, due 2022 Mammogram: completed 09/2019 Bone Density: not indicated  Recommended yearly ophthalmology/optometry visit for glaucoma screening and checkup Recommended yearly dental visit for hygiene and checkup  Vaccinations: Influenza vaccine: up to date  Pneumococcal vaccine: up to date  Tdap vaccine: up to date Shingles vaccine: shingrix eligible    Covid-19 Vaccine: We are recommending the vaccine to everyone who has not had an allergic reaction to any of the components of the vaccine. If you have specific questions about the vaccine, please bring them up with your health care provider to discuss them.   We will likely not be getting the vaccine in the office for the first rounds of vaccinations. The way they are releasing the vaccines is going to be through the health systems (like [facility], [place], [facility], [place]) or through your county health department.   The [facility] Department is giving vaccines to those 75+ starting 10/03/19  M-F 7AM to 4PM [facility] [address], [place], [place] in a drive through tent  If you are 65+ you can get a vaccine through [facility] by signing up for an appointment.  You can sign up by going to: [email or web address].  You can get more information by going to: [email or web address]  Advanced directives: please pick up a copy of this information next time you are in the office   Conditions/risks identified: none   Next appointment: Follow up in one year for your annual wellness visit   Preventive Care 40-64 Years, Female Preventive care refers to lifestyle choices and visits with your health  care provider that can promote health and wellness. What does preventive care include?  A yearly physical exam. This is also called an annual well check.  Dental exams once or twice a year.  Routine eye exams. Ask your health care provider how often you should have your eyes checked.  Personal lifestyle choices, including:  Daily care of your teeth and gums.  Regular physical activity.  Eating a healthy diet.  Avoiding tobacco and drug use.  Limiting alcohol use.  Practicing safe sex.  Taking low-dose aspirin daily starting at age 66.  Taking vitamin and mineral supplements as recommended by your health care provider. What happens during an annual well check? The services and screenings done by your health care provider during your annual well check will depend on your age, overall health, lifestyle risk factors, and family history of disease. Counseling  Your health care provider may ask you questions about your:  Alcohol use.  Tobacco use.  Drug use.  Emotional well-being.  Home and relationship well-being.  Sexual activity.  Eating habits.  Work and work Statistician.  Method of birth control.  Menstrual cycle.  Pregnancy history. Screening  You may have the following tests or measurements:  Height, weight, and BMI.  Blood pressure.  Lipid and cholesterol levels. These may be checked every 5 years, or more frequently if you are over 7 years old.  Skin check.  Lung cancer screening. You may have this screening every year starting at age 32 if you have a 30-pack-year history of smoking and currently smoke or have quit within the past 15 years.  Fecal occult  blood test (FOBT) of the stool. You may have this test every year starting at age 53.  Flexible sigmoidoscopy or colonoscopy. You may have a sigmoidoscopy every 5 years or a colonoscopy every 10 years starting at age 63.  Hepatitis C blood test.  Hepatitis B blood test.  Sexually  transmitted disease (STD) testing.  Diabetes screening. This is done by checking your blood sugar (glucose) after you have not eaten for a while (fasting). You may have this done every 1-3 years.  Mammogram. This may be done every 1-2 years. Talk to your health care provider about when you should start having regular mammograms. This may depend on whether you have a family history of breast cancer.  BRCA-related cancer screening. This may be done if you have a family history of breast, ovarian, tubal, or peritoneal cancers.  Pelvic exam and Pap test. This may be done every 3 years starting at age 41. Starting at age 43, this may be done every 5 years if you have a Pap test in combination with an HPV test.  Bone density scan. This is done to screen for osteoporosis. You may have this scan if you are at high risk for osteoporosis. Discuss your test results, treatment options, and if necessary, the need for more tests with your health care provider. Vaccines  Your health care provider may recommend certain vaccines, such as:  Influenza vaccine. This is recommended every year.  Tetanus, diphtheria, and acellular pertussis (Tdap, Td) vaccine. You may need a Td booster every 10 years.  Zoster vaccine. You may need this after age 74.  Pneumococcal 13-valent conjugate (PCV13) vaccine. You may need this if you have certain conditions and were not previously vaccinated.  Pneumococcal polysaccharide (PPSV23) vaccine. You may need one or two doses if you smoke cigarettes or if you have certain conditions. Talk to your health care provider about which screenings and vaccines you need and how often you need them. This information is not intended to replace advice given to you by your health care provider. Make sure you discuss any questions you have with your health care provider. Document Released: 10/10/2015 Document Revised: 06/02/2016 Document Reviewed: 07/15/2015 Elsevier Interactive Patient  Education  2017 [place] Prevention in the Home Falls can cause injuries. They can happen to people of all ages. There are many things you can do to make your home safe and to help prevent falls. What can I do on the outside of my home?  Regularly fix the edges of walkways and driveways and fix any cracks.  Remove anything that might make you trip as you walk through a door, such as a raised step or threshold.  Trim any bushes or trees on the path to your home.  Use bright outdoor lighting.  Clear any walking paths of anything that might make someone trip, such as rocks or tools.  Regularly check to see if handrails are loose or broken. Make sure that both sides of any steps have handrails.  Any raised decks and porches should have guardrails on the edges.  Have any leaves, snow, or ice cleared regularly.  Use sand or salt on walking paths during winter.  Clean up any spills in your garage right away. This includes oil or grease spills. What can I do in the bathroom?  Use night lights.  Install grab bars by the toilet and in the tub and shower. Do not use towel bars as grab bars.  Use non-skid  mats or decals in the tub or shower.  If you need to sit down in the shower, use a plastic, non-slip stool.  Keep the floor dry. Clean up any water that spills on the floor as soon as it happens.  Remove soap buildup in the tub or shower regularly.  Attach bath mats securely with double-sided non-slip rug tape.  Do not have throw rugs and other things on the floor that can make you trip. What can I do in the bedroom?  Use night lights.  Make sure that you have a light by your bed that is easy to reach.  Do not use any sheets or blankets that are too big for your bed. They should not hang down onto the floor.  Have a firm chair that has side arms. You can use this for support while you get dressed.  Do not have throw rugs and other things on the floor that can  make you trip. What can I do in the kitchen?  Clean up any spills right away.  Avoid walking on wet floors.  Keep items that you use a lot in easy-to-reach places.  If you need to reach something above you, use a strong step stool that has a grab bar.  Keep electrical cords out of the way.  Do not use floor polish or wax that makes floors slippery. If you must use wax, use non-skid floor wax.  Do not have throw rugs and other things on the floor that can make you trip. What can I do with my stairs?  Do not leave any items on the stairs.  Make sure that there are handrails on both sides of the stairs and use them. Fix handrails that are broken or loose. Make sure that handrails are as long as the stairways.  Check any carpeting to make sure that it is firmly attached to the stairs. Fix any carpet that is loose or worn.  Avoid having throw rugs at the top or bottom of the stairs. If you do have throw rugs, attach them to the floor with carpet tape.  Make sure that you have a light switch at the top of the stairs and the bottom of the stairs. If you do not have them, ask someone to add them for you. What else can I do to help prevent falls?  Wear shoes that:  Do not have high heels.  Have rubber bottoms.  Are comfortable and fit you well.  Are closed at the toe. Do not wear sandals.  If you use a stepladder:  Make sure that it is fully opened. Do not climb a closed stepladder.  Make sure that both sides of the stepladder are locked into place.  Ask someone to hold it for you, if possible.  Clearly mark and make sure that you can see:  Any grab bars or handrails.  First and last steps.  Where the edge of each step is.  Use tools that help you move around (mobility aids) if they are needed. These include:  Canes.  Walkers.  Scooters.  Crutches.  Turn on the lights when you go into a dark area. Replace any light bulbs as soon as they burn out.  Set up your  furniture so you have a clear path. Avoid moving your furniture around.  If any of your floors are uneven, fix them.  If there are any pets around you, be aware of where they are.  Review your medicines with your  doctor. Some medicines can make you feel dizzy. This can increase your chance of falling. Ask your doctor what other things that you can do to help prevent falls. This information is not intended to replace advice given to you by your health care provider. Make sure you discuss any questions you have with your health care provider. Document Released: 07/10/2009 Document Revised: 02/19/2016 Document Reviewed: 10/18/2014 Elsevier Interactive Patient Education  2017 [employer].

## 2019-10-31 NOTE — Progress Notes (Signed)
Subjective:   Kristina Henson is a 64 y.o. female who presents for Medicare Annual (Subsequent) preventive examination.  This visit is being conducted via phone call  - after an attmept to do on video chat - due to the COVID-19 pandemic. This patient has given me verbal consent via phone to conduct this visit, patient states they are participating from their home address. Some vital signs may be absent or patient reported.   Patient identification: identified by name, DOB, and current address.     Review of Systems:   Cardiac Risk Factors include: advanced age (>66men, >71 women);hypertension     Objective:     Vitals: There were no vitals taken for this visit.  There is no height or weight on file to calculate BMI.  Advanced Directives 10/31/2019 07/26/2019 01/24/2019 11/05/2018 03/06/2018 01/23/2018 10/23/2017  Does Patient Have a Medical Advance Directive? No No No No No No No  Does patient want to make changes to medical advance directive? - - - - - - No - Patient declined  Would patient like information on creating a medical advance directive? - No - Patient declined No - Patient declined No - Patient declined No - Patient declined No - Patient declined No - Patient declined    Tobacco Social History   Tobacco Use  Smoking Status Former Smoker  . Quit date: 30  . Years since quitting: 43.1  Smokeless Tobacco Never Used  Tobacco Comment   didnt smoke much      Counseling given: Not Answered Comment: didnt smoke much    Clinical Intake:  Pre-visit preparation completed: Yes  Pain : No/denies pain     Nutritional Risks: None Diabetes: No  How often do you need to have someone help you when you read instructions, pamphlets, or other written materials from your doctor or pharmacy?: 1 - Never  Interpreter Needed?: No  Information entered by :: Arman Loy Hilll,LPN  Past Medical History:  Diagnosis Date  . Anemia   . Arthritis    BACK-LUMBAR  . Atrial  fibrillation ([place])   . Coronary artery disease   . Duodenitis   . Dysrhythmia   . Gastritis   . Hypertension   . Mass in neck    lt side  . Pneumonia 2016  . Stroke ([place]) 2012  . Stroke [facility]) 2012   Past Surgical History:  Procedure Laterality Date  . ABDOMINAL HYSTERECTOMY     Total  . cardiac catherization    . COLONOSCOPY WITH PROPOFOL N/A 11/18/2015   Procedure: COLONOSCOPY WITH PROPOFOL;  Surgeon: Lucilla Lame, MD;  Location: [facility] ENDOSCOPY;  Service: Endoscopy;  Laterality: N/A;  . CORONARY ANGIOPLASTY    . ESOPHAGOGASTRODUODENOSCOPY (EGD) WITH PROPOFOL N/A 04/23/2016   Procedure: ESOPHAGOGASTRODUODENOSCOPY (EGD) WITH PROPOFOL;  Surgeon: Lucilla Lame, MD;  Location: [facility];  Service: Endoscopy;  Laterality: N/A;  small bowel bx gastric antrum bx  . SUBMANDIBULAR GLAND EXCISION Left 09/08/2017   Procedure: EXCISION SUBMANDIBULAR GLAND;  Surgeon: Carloyn Manner, MD;  Location: [facility] ORS;  Service: ENT;  Laterality: Left;  . throat biopsy     Family History  Problem Relation Age of Onset  . Arthritis Mother   . Cancer Mother   . Hypertension Sister   . Asthma Brother   . Dementia Brother   . Breast cancer Neg Hx   . Kidney cancer Neg Hx   . Bladder Cancer Neg Hx    Social History   Socioeconomic History  . Marital  status: Divorced    Spouse name: Not on file  . Number of children: Not on file  . Years of education: 42  . Highest education level: 12th grade  Occupational History  . Occupation: disability   Tobacco Use  . Smoking status: Former Smoker    Quit date: 1978    Years since quitting: 43.1  . Smokeless tobacco: Never Used  . Tobacco comment: didnt smoke much   Substance and Sexual Activity  . Alcohol use: Not Currently  . Drug use: No  . Sexual activity: Yes  Other Topics Concern  . Not on file  Social History Narrative  . Not on file   Social Determinants of Health   Financial Resource Strain:   . Difficulty of Paying Living  Expenses: Not on file  Food Insecurity:   . Worried About Charity fundraiser in the Last Year: Not on file  . Ran Out of Food in the Last Year: Not on file  Transportation Needs:   . Lack of Transportation (Medical): Not on file  . Lack of Transportation (Non-Medical): Not on file  Physical Activity:   . Days of Exercise per Week: Not on file  . Minutes of Exercise per Session: Not on file  Stress:   . Feeling of Stress : Not on file  Social Connections:   . Frequency of Communication with Friends and Family: Not on file  . Frequency of Social Gatherings with Friends and Family: Not on file  . Attends Religious Services: Not on file  . Active Member of Clubs or Organizations: Not on file  . Attends Archivist Meetings: Not on file  . Marital Status: Not on file    Outpatient Encounter Medications as of 10/31/2019  Medication Sig  . amiodarone (PACERONE) 200 MG tablet Take 1 tablet (200 mg total) by mouth every morning.  Marland Kitchen amLODipine (NORVASC) 5 MG tablet Take 1 tablet (5 mg total) by mouth daily.  . Ascorbic Acid (VITAMIN C) 100 MG tablet Take 100 mg by mouth daily.  Marland Kitchen aspirin EC 81 MG tablet Take 81 mg by mouth daily.   . ferrous sulfate 325 (65 FE) MG EC tablet Take 1 tablet (325 mg total) by mouth 3 (three) times daily with meals.  . hydrALAZINE (APRESOLINE) 50 MG tablet Take 1 tablet (50 mg total) by mouth 2 (two) times daily.  Marland Kitchen lisinopril-hydrochlorothiazide (ZESTORETIC) 20-25 MG tablet Take 1 tablet by mouth 2 (two) times daily.  . Multiple Vitamins-Minerals (MULTIVITAMIN WITH MINERALS) tablet Take 1 tablet by mouth daily.  . [DISCONTINUED] CRANBERRY PO Take 1 tablet by mouth daily.   No facility-administered encounter medications on file as of 10/31/2019.    Activities of Daily Living In your present state of health, do you have any difficulty performing the following activities: 10/31/2019  Hearing? N  Comment no hearing aids  Vision? N  Comment reading glasses,  [facility]  Difficulty concentrating or making decisions? N  Walking or climbing stairs? N  Dressing or bathing? N  Doing errands, shopping? N  Comment has Agricultural engineer and eating ? N  Using the Toilet? N  In the past six months, have you accidently leaked urine? N  Do you have problems with loss of bowel control? N  Managing your Medications? N  Managing your Finances? N  Housekeeping or managing your Housekeeping? N  Some recent data might be hidden    Patient Care Team: Valerie Roys,  DO as PCP - General (Family Medicine) Fath, Javier Docker, MD as Consulting Physician (Cardiology) Carloyn Manner, MD as Referring Physician (Otolaryngology) Minor, Dalbert Garnet, RN (Inactive) as [place] Management    Assessment:   This is a routine wellness examination for Maggiemae.  Exercise Activities and Dietary recommendations Current Exercise Habits: Home exercise routine, Time (Minutes): 45, Frequency (Times/Week): 2, Weekly Exercise (Minutes/Week): 90, Intensity: Mild, Exercise limited by: None identified  Goals    . DIET - INCREASE WATER INTAKE     Recommend drinking at least 6-8 glasses of water a day        Fall Risk: Fall Risk  10/31/2019 10/16/2018 04/03/2018 10/03/2017 07/19/2017  Falls in the past year? 0 0 No No No  Number falls in past yr: 0 0 - - -  Injury with Fall? 0 0 - - -    FALL RISK PREVENTION PERTAINING TO THE HOME:  Any stairs in or around the home? Yes  If so, are there any without handrails? No   Home free of loose throw rugs in walkways, pet beds, electrical cords, etc? Yes  Adequate lighting in your home to reduce risk of falls? Yes   ASSISTIVE DEVICES UTILIZED TO PREVENT FALLS:  Life alert? No  Use of a cane, walker or w/c? No  Grab bars in the bathroom? yes Shower chair or bench in shower? yes Elevated toilet seat or a handicapped toilet? No   DME ORDERS:  DME order needed?  No   TIMED UP AND  GO:  Unable to perform    Depression Screen PHQ 2/9 Scores 10/31/2019 10/16/2018 04/03/2018 10/03/2017  PHQ - 2 Score 0 0 0 0  PHQ- 9 Score - 0 0 -     Cognitive Function     6CIT Screen 10/16/2018 10/03/2017 08/11/2016  What Year? 0 points 0 points 0 points  What month? 0 points 0 points 0 points  What time? 0 points 0 points 0 points  Count back from 20 0 points 0 points 2 points  Months in reverse 0 points 0 points 0 points  Repeat phrase 4 points 0 points 2 points  Total Score 4 0 4    Immunization History  Administered Date(s) Administered  . Influenza,inj,Quad PF,6+ Mos 06/25/2015, 06/16/2017, 06/09/2018, 07/08/2019  . Influenza-Unspecified 06/29/2016  . Pneumococcal Polysaccharide-23 07/29/2015  . Td 01/27/2015    Qualifies for Shingles Vaccine? Yes  Zostavax completed n/a. Due for Shingrix. Education has been provided regarding the importance of this vaccine. Pt has been advised to call insurance company to determine out of pocket expense. Advised may also receive vaccine at local pharmacy or Health Dept. Verbalized acceptance and understanding.  Tdap: up to date   Flu Vaccine: up to date   Pneumococcal Vaccine: up to date   Covid-19: wants to discuss with PCP  Screening Tests Health Maintenance  Topic Date Due  . COLONOSCOPY  11/17/2020  . MAMMOGRAM  10/11/2021  . TETANUS/TDAP  01/26/2025  . INFLUENZA VACCINE  Completed  . Hepatitis C Screening  Completed  . HIV Screening  Completed    Cancer Screenings:  Colorectal Screening: Completed 11/18/2015. Repeat every 5 years  Mammogram: Completed 10/12/2019. Repeat every year  Bone Density: not indicated  Lung Cancer Screening: (Low Dose CT Chest recommended if Age 60-80 years, 30 pack-year currently smoking OR have quit w/in 15years.) does not qualify.     Hepatitis C Screening: does not qualify; Completed 04/11/2017  Vision Screening: Recommended annual ophthalmology  exams for early detection of glaucoma and  other disorders of the eye. Is the patient up to date with their annual eye exam?  Yes  Who is the provider or what is the name of the office in which the pt attends annual eye exams? Dr.Nice  Dental Screening: Recommended annual dental exams for proper oral hygiene  Community Resource Referral:  CRR required this visit?  No       Plan:  I have personally reviewed and addressed the Medicare Annual Wellness questionnaire and have noted the following in the patient's chart:  A. Medical and social history B. Use of alcohol, tobacco or illicit drugs  C. Current medications and supplements D. Functional ability and status E.  Nutritional status F.  Physical activity G. Advance directives H. List of other physicians I.  Hospitalizations, surgeries, and ER visits in previous 12 months J.  [place] such as hearing and vision if needed, cognitive and depression L. Referrals and appointments   In addition, I have reviewed and discussed with patient certain preventive protocols, quality metrics, and best practice recommendations. A written personalized care plan for preventive services as well as general preventive health recommendations were provided to patient.  Signed,    Bevelyn Ngo, LPN  [id number] Nurse Health Advisor   Nurse Notes: none

## 2019-11-02 ENCOUNTER — Other Ambulatory Visit: Payer: Self-pay

## 2019-11-02 ENCOUNTER — Encounter: Payer: Self-pay | Admitting: Family Medicine

## 2019-11-02 ENCOUNTER — Ambulatory Visit (INDEPENDENT_AMBULATORY_CARE_PROVIDER_SITE_OTHER): Payer: Medicare Other | Admitting: Family Medicine

## 2019-11-02 VITALS — BP 120/79 | HR 92 | Temp 98.6°F

## 2019-11-02 DIAGNOSIS — I700 Atherosclerosis of aorta: Secondary | ICD-10-CM

## 2019-11-02 DIAGNOSIS — E782 Mixed hyperlipidemia: Secondary | ICD-10-CM | POA: Diagnosis not present

## 2019-11-02 DIAGNOSIS — R8281 Pyuria: Secondary | ICD-10-CM | POA: Diagnosis not present

## 2019-11-02 DIAGNOSIS — D638 Anemia in other chronic diseases classified elsewhere: Secondary | ICD-10-CM | POA: Diagnosis not present

## 2019-11-02 DIAGNOSIS — I129 Hypertensive chronic kidney disease with stage 1 through stage 4 chronic kidney disease, or unspecified chronic kidney disease: Secondary | ICD-10-CM

## 2019-11-02 DIAGNOSIS — I4891 Unspecified atrial fibrillation: Secondary | ICD-10-CM

## 2019-11-02 DIAGNOSIS — D500 Iron deficiency anemia secondary to blood loss (chronic): Secondary | ICD-10-CM | POA: Diagnosis not present

## 2019-11-02 MED ORDER — HYDRALAZINE HCL 50 MG PO TABS: 50.0000 mg | ORAL_TABLET | Freq: Two times a day (BID) | ORAL | 1 refills | Status: DC

## 2019-11-02 MED ORDER — AMLODIPINE BESYLATE 5 MG PO TABS: 5.0000 mg | ORAL_TABLET | Freq: Every day | ORAL | 1 refills | Status: DC

## 2019-11-02 MED ORDER — LISINOPRIL-HYDROCHLOROTHIAZIDE 20-25 MG PO TABS: 1.0000 | ORAL_TABLET | Freq: Two times a day (BID) | ORAL | 1 refills | Status: DC

## 2019-11-02 MED ORDER — FERROUS SULFATE 325 (65 FE) MG PO TBEC: 325.0000 mg | DELAYED_RELEASE_TABLET | Freq: Three times a day (TID) | ORAL | 1 refills | Status: DC

## 2019-11-02 NOTE — Assessment & Plan Note (Signed)
Not on any medicine right now. Will recheck labs and treat as needed. Call with any concerns. Continue to monitor.

## 2019-11-02 NOTE — Assessment & Plan Note (Signed)
Rechecking labs today. Await results. Treat as needed.  °

## 2019-11-02 NOTE — Assessment & Plan Note (Signed)
HR under good control today. Stable. Continue to monitor. Continue to follow with cardiology.

## 2019-11-02 NOTE — Patient Instructions (Signed)
We are recommending the vaccine to everyone who has not had an allergic reaction to any of the components of the vaccine. If you have specific questions about the vaccine, please bring them up with your health care provider to discuss them.   We will likely not be getting the vaccine in the office for the first rounds of vaccinations. The way they are releasing the vaccines is going to be through the health systems (like [facility], [facility], [facility], [facility]) or through your county health department.   The [place] [facility] is giving vaccines to those 75+ starting 10/03/19  M-F 7AM to 4PM [facility] [address], [place], [place] [facility] in a drive through tent  If you are 65+ you can get a vaccine through [facility] by signing up for an appointment.  You can sign up by going to: [email or web address].  You can get more information by going to: [email or web address] 

## 2019-11-02 NOTE — Assessment & Plan Note (Addendum)
Will keep BP and cholesterol under good control. Checking labs today. Call with any concerns. Continue to follow with cardiology.

## 2019-11-02 NOTE — Assessment & Plan Note (Signed)
Under good control on current regimen. Continue current regimen. Continue to monitor. Call with any concerns. Refills given. Labs drawn today.   

## 2019-11-02 NOTE — Progress Notes (Signed)
BP 120/79   Pulse 92   Temp 98.6 F (37 C)    Subjective:    Patient ID: Kristina Henson, female    DOB: 04-Mar-1956, 64 y.o.   MRN: [id number]  HPI: Kristina Henson is a 64 y.o. female  Chief Complaint  Patient presents with  . Hypertension  . Hyperlipidemia  . Anemia   Had her wellness with Tiffany earlier this week.   HYPERTENSION / HYPERLIPIDEMIA Satisfied with current treatment? yes Duration of hypertension: chronic BP monitoring frequency: not checking BP medication side effects: no Past BP meds: lisinopril- HCTZ, hydralazine, amlodipine Duration of hyperlipidemia: chronic Cholesterol medication side effects: not on anything Medication compliance: excellent compliance Aspirin: yes Recent stressors: no Recurrent headaches: no Visual changes: no Palpitations: no Dyspnea: no Chest pain: no Lower extremity edema: no Dizzy/lightheaded: no   ANEMIA Anemia status: stable Etiology of anemia: iron deciency Duration of anemia treatment: chronic Compliance with treatment: excellent compliance Iron supplementation side effects: no Severity of anemia: mild Fatigue: no Decreased exercise tolerance: no  Dyspnea on exertion: no Palpitations: no Bleeding: no Pica: no   Relevant past medical, surgical, family and social history reviewed and updated as indicated. Interim medical history since our last visit reviewed. Allergies and medications reviewed and updated.  Review of Systems  Constitutional: Negative.   Respiratory: Negative.   Cardiovascular: Negative.   Musculoskeletal: Negative.   Neurological: Negative.   Psychiatric/Behavioral: Negative.     Per HPI unless specifically indicated above     Objective:    BP 120/79   Pulse 92   Temp 98.6 F (37 C)   Wt Readings from Last 3 Encounters:  07/26/19 169 lb 12.8 oz (77 kg)  01/24/19 171 lb 6.4 oz (77.7 kg)  11/05/18 170 lb (77.1 kg)    Physical Exam Vitals and nursing note reviewed.    Constitutional:      General: She is not in acute distress.    Appearance: Normal appearance. She is not ill-appearing, toxic-appearing or diaphoretic.  HENT:     Head: Normocephalic and atraumatic.     Right Ear: External ear normal.     Left Ear: External ear normal.     Nose: Nose normal.     Mouth/Throat:     Mouth: Mucous membranes are moist.     Pharynx: Oropharynx is clear.  Eyes:     General: No scleral icterus.       Right eye: No discharge.        Left eye: No discharge.     Extraocular Movements: Extraocular movements intact.     Conjunctiva/sclera: Conjunctivae normal.     Pupils: Pupils are equal, round, and reactive to light.  Cardiovascular:     Rate and Rhythm: Normal rate and regular rhythm.     Pulses: Normal pulses.     Heart sounds: Normal heart sounds. No murmur. No friction rub. No gallop.   Pulmonary:     Effort: Pulmonary effort is normal. No respiratory distress.     Breath sounds: Normal breath sounds. No stridor. No wheezing, rhonchi or rales.  Chest:     Chest wall: No tenderness.  Musculoskeletal:        General: Normal range of motion.     Cervical back: Normal range of motion and neck supple.  Skin:    General: Skin is warm and dry.     Capillary Refill: Capillary refill takes less than 2 seconds.     Coloration: Skin is  not jaundiced or pale.     Findings: No bruising, erythema, lesion or rash.  Neurological:     General: No focal deficit present.     Mental Status: She is alert and oriented to person, place, and time. Mental status is at baseline.  Psychiatric:        Mood and Affect: Mood normal.        Behavior: Behavior normal.        Thought Content: Thought content normal.        Judgment: Judgment normal.     Results for orders placed or performed in visit on 07/26/19  Ferritin  Result Value Ref Range   Ferritin 179 11 - 307 ng/mL  Iron and TIBC  Result Value Ref Range   Iron 49 28 - 170 ug/dL   TIBC 284 250 - 450 ug/dL    Saturation Ratios 17 10.4 - 31.8 %   UIBC 235 ug/dL      Assessment & Plan:   Problem List Items Addressed This Visit      Cardiovascular and Mediastinum   Atrial fibrillation (HCC)    HR under good control today. Stable. Continue to monitor. Continue to follow with cardiology.      Relevant Medications   amLODipine (NORVASC) 5 MG tablet   hydrALAZINE (APRESOLINE) 50 MG tablet   lisinopril-hydrochlorothiazide (ZESTORETIC) 20-25 MG tablet   Other Relevant Orders   CBC with Differential/Platelet   Comprehensive metabolic panel   Atherosclerosis of aorta (HCC) - Primary    Will keep BP and cholesterol under good control. Checking labs today. Call with any concerns. Continue to follow with cardiology.      Relevant Medications   amLODipine (NORVASC) 5 MG tablet   hydrALAZINE (APRESOLINE) 50 MG tablet   lisinopril-hydrochlorothiazide (ZESTORETIC) 20-25 MG tablet   Other Relevant Orders   CBC with Differential/Platelet   Comprehensive metabolic panel     Genitourinary   Benign hypertensive renal disease    Under good control on current regimen. Continue current regimen. Continue to monitor. Call with any concerns. Refills given. Labs drawn today.       Relevant Orders   CBC with Differential/Platelet   Comprehensive metabolic panel   Microalbumin, Urine Waived   TSH   UA/M w/rflx Culture, Routine     Other   HLD (hyperlipidemia)    Not on any medicine right now. Will recheck labs and treat as needed. Call with any concerns. Continue to monitor.       Relevant Medications   amLODipine (NORVASC) 5 MG tablet   hydrALAZINE (APRESOLINE) 50 MG tablet   lisinopril-hydrochlorothiazide (ZESTORETIC) 20-25 MG tablet   Other Relevant Orders   CBC with Differential/Platelet   Comprehensive metabolic panel   Lipid Panel w/o Chol/HDL Ratio   Iron deficiency anemia due to chronic blood loss    Rechecking labs today. Await results. Treat as needed.       Relevant Medications    ferrous sulfate 325 (65 FE) MG EC tablet   Other Relevant Orders   CBC with Differential/Platelet   Comprehensive metabolic panel   Iron and TIBC   Ferritin   Anemia due to chronic illness    Rechecking labs today. Await results. Treat as needed.       Relevant Medications   ferrous sulfate 325 (65 FE) MG EC tablet   Other Relevant Orders   CBC with Differential/Platelet   Comprehensive metabolic panel       Follow up plan: Return in  about 6 months (around 05/01/2020).

## 2019-11-03 LAB — CBC WITH DIFFERENTIAL/PLATELET
Basophils Absolute: 0 10*3/uL (ref 0.0–0.2)
Basos: 1 %
EOS (ABSOLUTE): 0 10*3/uL (ref 0.0–0.4)
Eos: 1 %
Hematocrit: 37.1 % (ref 34.0–46.6)
Hemoglobin: 12.1 g/dL (ref 11.1–15.9)
Immature Grans (Abs): 0 10*3/uL (ref 0.0–0.1)
Immature Granulocytes: 1 %
Lymphocytes Absolute: 0.7 10*3/uL (ref 0.7–3.1)
Lymphs: 17 %
MCH: 27.2 pg (ref 26.6–33.0)
MCHC: 32.6 g/dL (ref 31.5–35.7)
MCV: 83 fL (ref 79–97)
Monocytes Absolute: 0.2 10*3/uL (ref 0.1–0.9)
Monocytes: 6 %
Neutrophils Absolute: 2.8 10*3/uL (ref 1.4–7.0)
Neutrophils: 74 %
Platelets: 131 10*3/uL — ABNORMAL LOW (ref 150–450)
RBC: 4.45 x10E6/uL (ref 3.77–5.28)
RDW: 15.4 % (ref 11.7–15.4)
WBC: 3.8 10*3/uL (ref 3.4–10.8)

## 2019-11-03 LAB — COMPREHENSIVE METABOLIC PANEL
ALT: 62 IU/L — ABNORMAL HIGH (ref 0–32)
AST: 37 IU/L (ref 0–40)
Albumin/Globulin Ratio: 1.2 (ref 1.2–2.2)
Albumin: 3.8 g/dL (ref 3.8–4.8)
Alkaline Phosphatase: 96 IU/L (ref 39–117)
BUN/Creatinine Ratio: 24 (ref 12–28)
BUN: 25 mg/dL (ref 8–27)
Bilirubin Total: 0.3 mg/dL (ref 0.0–1.2)
CO2: 22 mmol/L (ref 20–29)
Calcium: 9.6 mg/dL (ref 8.7–10.3)
Chloride: 104 mmol/L (ref 96–106)
Creatinine, Ser: 1.03 mg/dL — ABNORMAL HIGH (ref 0.57–1.00)
GFR calc Af Amer: 68 mL/min/{1.73_m2} (ref >59)
GFR calc non Af Amer: 59 mL/min/{1.73_m2} — ABNORMAL LOW (ref >59)
Globulin, Total: 3.2 g/dL (ref 1.5–4.5)
Glucose: 100 mg/dL — ABNORMAL HIGH (ref 65–99)
Potassium: 4.4 mmol/L (ref 3.5–5.2)
Sodium: 141 mmol/L (ref 134–144)
Total Protein: 7 g/dL (ref 6.0–8.5)

## 2019-11-03 LAB — LIPID PANEL W/O CHOL/HDL RATIO
Cholesterol, Total: 185 mg/dL (ref 100–199)
HDL: 43 mg/dL (ref >39)
LDL Chol Calc (NIH): 116 mg/dL — ABNORMAL HIGH (ref 0–99)
Triglycerides: 148 mg/dL (ref 0–149)
VLDL Cholesterol Cal: 26 mg/dL (ref 5–40)

## 2019-11-03 LAB — FERRITIN: Ferritin: 309 ng/mL — ABNORMAL HIGH (ref 15–150)

## 2019-11-03 LAB — TSH: TSH: 0.929 u[IU]/mL (ref 0.450–4.500)

## 2019-11-03 LAB — IRON AND TIBC
Iron Saturation: 18 % (ref 15–55)
Iron: 49 ug/dL (ref 27–139)
Total Iron Binding Capacity: 272 ug/dL (ref 250–450)
UIBC: 223 ug/dL (ref 118–369)

## 2019-11-04 LAB — UA/M W/RFLX CULTURE, ROUTINE
Bilirubin, UA: NEGATIVE
Glucose, UA: NEGATIVE
Ketones, UA: NEGATIVE
Nitrite, UA: NEGATIVE
Protein,UA: NEGATIVE
RBC, UA: NEGATIVE
Specific Gravity, UA: 1.025 (ref 1.005–1.030)
Urobilinogen, Ur: 0.2 mg/dL (ref 0.2–1.0)
pH, UA: 6 (ref 5.0–7.5)

## 2019-11-04 LAB — URINE CULTURE, REFLEX

## 2019-11-04 LAB — MICROSCOPIC EXAMINATION

## 2019-11-04 LAB — MICROALBUMIN, URINE WAIVED
Creatinine, Urine Waived: 200 mg/dL (ref 10–300)
Microalb, Ur Waived: 80 mg/L — ABNORMAL HIGH (ref 0–19)
Microalb/Creat Ratio: <30 mg/g (ref <30)

## 2019-11-05 ENCOUNTER — Encounter: Payer: Self-pay | Admitting: Family Medicine

## 2019-11-18 DIAGNOSIS — Z23 Encounter for immunization: Secondary | ICD-10-CM | POA: Diagnosis not present

## 2019-12-18 DIAGNOSIS — Z23 Encounter for immunization: Secondary | ICD-10-CM | POA: Diagnosis not present

## 2019-12-19 ENCOUNTER — Encounter: Payer: Self-pay | Admitting: Family Medicine

## 2019-12-19 ENCOUNTER — Other Ambulatory Visit: Payer: Self-pay

## 2019-12-19 ENCOUNTER — Ambulatory Visit (INDEPENDENT_AMBULATORY_CARE_PROVIDER_SITE_OTHER): Payer: Medicare Other | Admitting: Family Medicine

## 2019-12-19 VITALS — BP 111/70 | HR 63 | Temp 98.4°F | Ht 62.0 in | Wt 168.0 lb

## 2019-12-19 DIAGNOSIS — K1370 Unspecified lesions of oral mucosa: Secondary | ICD-10-CM | POA: Diagnosis not present

## 2019-12-19 MED ORDER — VALACYCLOVIR HCL 1 G PO TABS: 1000.0000 mg | ORAL_TABLET | Freq: Two times a day (BID) | ORAL | 0 refills | Status: DC

## 2019-12-19 NOTE — Progress Notes (Signed)
BP 111/70   Pulse 63   Temp 98.4 F (36.9 C) (Oral)   Ht 5\' 2"  (1.575 m)   Wt 168 lb (76.2 kg)   SpO2 97%   BMI 30.73 kg/m    Subjective:    Patient ID: Kristina Henson, female    DOB: September 28, 1955, 64 y.o.   MRN: [id number]  HPI: Kristina Henson is a 64 y.o. female  Chief Complaint  Patient presents with  . Mouth Lesions    left side mouth over a week ago. has tried OTC cream blistex   Patient presenting today with lesion on left lower lip that's been present for over a week now. Mildly sore, crusting. Keeping covered with blistex. A friend told her it was a fever blister. States she's had them in the past here and there but it's been years since she's had one. Never tested for HSV in the past. Denies fevers, chills, injury to area.   Relevant past medical, surgical, family and social history reviewed and updated as indicated. Interim medical history since our last visit reviewed. Allergies and medications reviewed and updated.  Review of Systems  Per HPI unless specifically indicated above     Objective:    BP 111/70   Pulse 63   Temp 98.4 F (36.9 C) (Oral)   Ht 5\' 2"  (1.575 m)   Wt 168 lb (76.2 kg)   SpO2 97%   BMI 30.73 kg/m   Wt Readings from Last 3 Encounters:  12/19/19 168 lb (76.2 kg)  07/26/19 169 lb 12.8 oz (77 kg)  01/24/19 171 lb 6.4 oz (77.7 kg)    Physical Exam Vitals and nursing note reviewed.  Constitutional:      Appearance: Normal appearance. She is not ill-appearing.  HENT:     Head: Atraumatic.  Eyes:     Extraocular Movements: Extraocular movements intact.     Conjunctiva/sclera: Conjunctivae normal.  Cardiovascular:     Rate and Rhythm: Normal rate and regular rhythm.     Heart sounds: Normal heart sounds.  Pulmonary:     Effort: Pulmonary effort is normal.     Breath sounds: Normal breath sounds.  Musculoskeletal:        General: Normal range of motion.     Cervical back: Normal range of motion and neck supple.    Lymphadenopathy:     Cervical: No cervical adenopathy.  Skin:    General: Skin is warm and dry.     Comments: Large erythematous ulceration just below left lower lip with scab  Neurological:     Mental Status: She is alert and oriented to person, place, and time.  Psychiatric:        Mood and Affect: Mood normal.        Thought Content: Thought content normal.        Judgment: Judgment normal.     Results for orders placed or performed in visit on 11/02/19  Microscopic Examination   URINE  Result Value Ref Range   WBC, UA 11-30 (A) 0 - 5 /hpf   Epithelial Cells (non renal) 0-10 0 - 10 /hpf   Bacteria, UA Moderate (A) None seen/Few  Urine Culture, Reflex   URINE  Result Value Ref Range   Urine Culture, Routine Final report    Organism ID, Bacteria Comment   CBC with Differential/Platelet  Result Value Ref Range   WBC 3.8 3.4 - 10.8 x10E3/uL   RBC 4.45 3.77 - 5.28 x10E6/uL   Hemoglobin  12.1 11.1 - 15.9 g/dL   Hematocrit 37.1 34.0 - 46.6 %   MCV 83 79 - 97 fL   MCH 27.2 26.6 - 33.0 pg   MCHC 32.6 31.5 - 35.7 g/dL   RDW 15.4 11.7 - 15.4 %   Platelets 131 (L) 150 - 450 x10E3/uL   Neutrophils 74 Not Estab. %   Lymphs 17 Not Estab. %   Monocytes 6 Not Estab. %   Eos 1 Not Estab. %   Basos 1 Not Estab. %   Neutrophils Absolute 2.8 1.4 - 7.0 x10E3/uL   Lymphocytes Absolute 0.7 0.7 - 3.1 x10E3/uL   Monocytes Absolute 0.2 0.1 - 0.9 x10E3/uL   EOS (ABSOLUTE) 0.0 0.0 - 0.4 x10E3/uL   Basophils Absolute 0.0 0.0 - 0.2 x10E3/uL   Immature Granulocytes 1 Not Estab. %   Immature Grans (Abs) 0.0 0.0 - 0.1 x10E3/uL  Comprehensive metabolic panel  Result Value Ref Range   Glucose 100 (H) 65 - 99 mg/dL   BUN 25 8 - 27 mg/dL   Creatinine, Ser 1.03 (H) 0.57 - 1.00 mg/dL   GFR calc non Af Amer 59 (L) >59 mL/min/1.73   GFR calc Af Amer 68 >59 mL/min/1.73   BUN/Creatinine Ratio 24 12 - 28   Sodium 141 134 - 144 mmol/L   Potassium 4.4 3.5 - 5.2 mmol/L   Chloride 104 96 - 106 mmol/L    CO2 22 20 - 29 mmol/L   Calcium 9.6 8.7 - 10.3 mg/dL   Total Protein 7.0 6.0 - 8.5 g/dL   Albumin 3.8 3.8 - 4.8 g/dL   Globulin, Total 3.2 1.5 - 4.5 g/dL   Albumin/Globulin Ratio 1.2 1.2 - 2.2   Bilirubin Total 0.3 0.0 - 1.2 mg/dL   Alkaline Phosphatase 96 39 - 117 IU/L   AST 37 0 - 40 IU/L   ALT 62 (H) 0 - 32 IU/L  Lipid Panel w/o Chol/HDL Ratio  Result Value Ref Range   Cholesterol, Total 185 100 - 199 mg/dL   Triglycerides 148 0 - 149 mg/dL   HDL 43 >39 mg/dL   VLDL Cholesterol Cal 26 5 - 40 mg/dL   LDL Chol Calc (NIH) 116 (H) 0 - 99 mg/dL  Microalbumin, Urine Waived  Result Value Ref Range   Microalb, Ur Waived 80 (H) 0 - 19 mg/L   Creatinine, Urine Waived 200 10 - 300 mg/dL   Microalb/Creat Ratio <30 <30 mg/g  TSH  Result Value Ref Range   TSH 0.929 0.450 - 4.500 uIU/mL  UA/M w/rflx Culture, Routine   Specimen: Urine   URINE  Result Value Ref Range   Specific Gravity, UA 1.025 1.005 - 1.030   pH, UA 6.0 5.0 - 7.5   Color, UA Yellow Yellow   Appearance Ur Clear Clear   Leukocytes,UA 3+ (A) Negative   Protein,UA Negative Negative/Trace   Glucose, UA Negative Negative   Ketones, UA Negative Negative   RBC, UA Negative Negative   Bilirubin, UA Negative Negative   Urobilinogen, Ur 0.2 0.2 - 1.0 mg/dL   Nitrite, UA Negative Negative   Microscopic Examination See below:    Urinalysis Reflex Comment   Iron and TIBC  Result Value Ref Range   Total Iron Binding Capacity 272 250 - 450 ug/dL   UIBC 223 118 - 369 ug/dL   Iron 49 27 - 139 ug/dL   Iron Saturation 18 15 - 55 %  Ferritin  Result Value Ref Range   Ferritin 309 (H)  15 - 150 ng/mL      Assessment & Plan:   Problem List Items Addressed This Visit    None    Visit Diagnoses    Mouth lesion    -  Primary   HSV vs impetigo. Tx with neosporin BID, valtrex, will test for HSV. Keep covered and avoid sharing beverages, kissing, etc   Relevant Orders   HSV(herpes simplex vrs) 1+2 ab-IgG       Follow up  plan: Return if symptoms worsen or fail to improve.

## 2019-12-20 ENCOUNTER — Encounter: Payer: Self-pay | Admitting: Family Medicine

## 2019-12-20 DIAGNOSIS — B009 Herpesviral infection, unspecified: Secondary | ICD-10-CM | POA: Insufficient documentation

## 2019-12-20 LAB — HSV(HERPES SIMPLEX VRS) I + II AB-IGG
HSV 1 Glycoprotein G Ab, IgG: 31.2 index — ABNORMAL HIGH (ref 0.00–0.90)
HSV 2 IgG, Type Spec: 16.8 index — ABNORMAL HIGH (ref 0.00–0.90)

## 2019-12-26 ENCOUNTER — Ambulatory Visit: Payer: Medicare Other | Admitting: Nurse Practitioner

## 2019-12-28 ENCOUNTER — Ambulatory Visit: Payer: Self-pay | Admitting: Nurse Practitioner

## 2020-01-02 ENCOUNTER — Encounter: Payer: Self-pay | Admitting: Nurse Practitioner

## 2020-01-02 ENCOUNTER — Ambulatory Visit (INDEPENDENT_AMBULATORY_CARE_PROVIDER_SITE_OTHER): Payer: Medicare Other | Admitting: Nurse Practitioner

## 2020-01-02 ENCOUNTER — Other Ambulatory Visit: Payer: Self-pay

## 2020-01-02 VITALS — BP 117/74 | HR 96 | Temp 97.7°F | Wt 167.6 lb

## 2020-01-02 DIAGNOSIS — N3000 Acute cystitis without hematuria: Secondary | ICD-10-CM | POA: Insufficient documentation

## 2020-01-02 DIAGNOSIS — R300 Dysuria: Secondary | ICD-10-CM | POA: Diagnosis not present

## 2020-01-02 MED ORDER — NITROFURANTOIN MONOHYD MACRO 100 MG PO CAPS: 100.0000 mg | ORAL_CAPSULE | Freq: Two times a day (BID) | ORAL | 0 refills | Status: AC

## 2020-01-02 NOTE — Progress Notes (Signed)
BP 117/74   Pulse 96   Temp 97.7 F (36.5 C) (Oral)   Wt 167 lb 9.6 oz (76 kg)   SpO2 97%   BMI 30.65 kg/m    Subjective:    Patient ID: Kristina Henson, female    DOB: [birth year], 64 y.o.   MRN: [id number]  HPI: Kristina Henson is a 64 y.o. female  Chief Complaint  Patient presents with  . Urinary Tract Infection    pt states she has been having urinary frequency, pressure, urine odor and cloudy urine since last week    URINARY SYMPTOMS Symptoms started last week.   Dysuria: yes Urinary frequency: yes Urgency: yes Small volume voids: yes Symptom severity: yes Urinary incontinence: no Foul odor: yes Hematuria: no Abdominal pain: no Back pain: no Suprapubic pain/pressure: no Flank pain: no Fever:  no Vomiting: no Relief with cranberry juice: yes Relief with pyridium: has not taken Status: stable Previous urinary tract infection: has not had one in long time Recurrent urinary tract infection: no Sexual activity: not in awhile History of sexually transmitted disease: no Treatments attempted: cranberry and increasing fluids   Relevant past medical, surgical, family and social history reviewed and updated as indicated. Interim medical history since our last visit reviewed. Allergies and medications reviewed and updated.  Review of Systems  Constitutional: Negative for activity change, appetite change, diaphoresis, fatigue and fever.  Respiratory: Negative for cough, chest tightness and shortness of breath.   Cardiovascular: Negative for chest pain, palpitations and leg swelling.  Gastrointestinal: Negative for abdominal distention, abdominal pain, constipation, diarrhea, nausea and vomiting.  Genitourinary: Positive for dysuria, frequency and urgency. Negative for decreased urine volume, difficulty urinating, flank pain, pelvic pain and vaginal discharge.  Neurological: Negative.   Psychiatric/Behavioral: Negative.     Per HPI unless specifically indicated  above     Objective:    BP 117/74   Pulse 96   Temp 97.7 F (36.5 C) (Oral)   Wt 167 lb 9.6 oz (76 kg)   SpO2 97%   BMI 30.65 kg/m   Wt Readings from Last 3 Encounters:  01/02/20 167 lb 9.6 oz (76 kg)  12/19/19 168 lb (76.2 kg)  07/26/19 169 lb 12.8 oz (77 kg)    Physical Exam Vitals and nursing note reviewed.  Constitutional:      General: She is awake. She is not in acute distress.    Appearance: She is well-developed and overweight. She is not ill-appearing.  HENT:     Head: Normocephalic.     Right Ear: Hearing normal.     Left Ear: Hearing normal.  Eyes:     General: Lids are normal.        Right eye: No discharge.        Left eye: No discharge.     Conjunctiva/sclera: Conjunctivae normal.     Pupils: Pupils are equal, round, and reactive to light.  Neck:     Vascular: No carotid bruit.  Cardiovascular:     Rate and Rhythm: Normal rate and regular rhythm.     Heart sounds: Normal heart sounds. No murmur. No gallop.   Pulmonary:     Effort: Pulmonary effort is normal. No accessory muscle usage or respiratory distress.     Breath sounds: Normal breath sounds.  Abdominal:     General: Bowel sounds are normal.     Palpations: Abdomen is soft. There is no hepatomegaly or splenomegaly.     Tenderness: There is no abdominal  tenderness. There is no right CVA tenderness or left CVA tenderness.  Musculoskeletal:     Cervical back: Normal range of motion and neck supple.     Right lower leg: No edema.     Left lower leg: No edema.  Skin:    General: Skin is warm and dry.  Neurological:     Mental Status: She is alert and oriented to person, place, and time.  Psychiatric:        Attention and Perception: Attention normal.        Mood and Affect: Mood normal.        Speech: Speech normal.        Behavior: Behavior normal. Behavior is cooperative.        Thought Content: Thought content normal.     Results for orders placed or performed in visit on 12/19/19   HSV(herpes simplex vrs) 1+2 ab-IgG  Result Value Ref Range   HSV 1 Glycoprotein G Ab, IgG 31.20 (H) 0.00 - 0.90 index   HSV 2 IgG, Type Spec 16.80 (H) 0.00 - 0.90 index      Assessment & Plan:   Problem List Items Addressed This Visit      Genitourinary   Acute cystitis without hematuria - Primary    Acute x one week with UA noting + nits and leuks today + bacteria.  Will send this for culture.  Script for [employer] sent to pharmacy and instructed patient on how to take this.  Recommend she continue cranberry tablets at home and increased fluid intake.  Educated on proper female hygiene.  Discussed with her that if culture returns showing no susceptibility to [facility], provider will contact her and switch abx regimen.  Return to office for worsening or ongoing symptoms.      Relevant Orders   UA/M w/rflx Culture, Routine       Follow up plan: Return if symptoms worsen or fail to improve.

## 2020-01-02 NOTE — Assessment & Plan Note (Signed)
Acute x one week with UA noting + nits and leuks today + bacteria.  Will send this for culture.  Script for [employer] sent to pharmacy and instructed patient on how to take this.  Recommend she continue cranberry tablets at home and increased fluid intake.  Educated on proper female hygiene.  Discussed with her that if culture returns showing no susceptibility to [facility], provider will contact her and switch abx regimen.  Return to office for worsening or ongoing symptoms.

## 2020-01-02 NOTE — Patient Instructions (Signed)
Urinary Tract Infection, Adult A urinary tract infection (UTI) is an infection of any part of the urinary tract. The urinary tract includes:  The kidneys.  The ureters.  The bladder.  The urethra. These organs make, store, and get rid of pee (urine) in the body. What are the causes? This is caused by germs (bacteria) in your genital area. These germs grow and cause swelling (inflammation) of your urinary tract. What increases the risk? You are more likely to develop this condition if:  You have a small, thin tube (catheter) to drain pee.  You cannot control when you pee or poop (incontinence).  You are female, and: ? You use these methods to prevent pregnancy:  A medicine that kills sperm (spermicide).  A device that blocks sperm (diaphragm). ? You have low levels of a female hormone (estrogen). ? You are pregnant.  You have genes that add to your risk.  You are sexually active.  You take antibiotic medicines.  You have trouble peeing because of: ? A prostate that is bigger than normal, if you are female. ? A blockage in the part of your body that drains pee from the bladder (urethra). ? A kidney stone. ? A nerve condition that affects your bladder (neurogenic bladder). ? Not getting enough to drink. ? Not peeing often enough.  You have other conditions, such as: ? Diabetes. ? A weak disease-fighting system (immune system). ? Sickle cell disease. ? Gout. ? Injury of the spine. What are the signs or symptoms? Symptoms of this condition include:  Needing to pee right away (urgently).  Peeing often.  Peeing small amounts often.  Pain or burning when peeing.  Blood in the pee.  Pee that smells bad or not like normal.  Trouble peeing.  Pee that is cloudy.  Fluid coming from the vagina, if you are female.  Pain in the belly or lower back. Other symptoms include:  Throwing up (vomiting).  No urge to eat.  Feeling mixed up (confused).  Being tired  and grouchy (irritable).  A fever.  Watery poop (diarrhea). How is this treated? This condition may be treated with:  Antibiotic medicine.  Other medicines.  Drinking enough water. Follow these instructions at home:  Medicines  Take over-the-counter and prescription medicines only as told by your doctor.  If you were prescribed an antibiotic medicine, take it as told by your doctor. Do not stop taking it even if you start to feel better. General instructions  Make sure you: ? Pee until your bladder is empty. ? Do not hold pee for a long time. ? Empty your bladder after sex. ? Wipe from front to back after pooping if you are a female. Use each tissue one time when you wipe.  Drink enough fluid to keep your pee pale yellow.  Keep all follow-up visits as told by your doctor. This is important. Contact a doctor if:  You do not get better after 1-2 days.  Your symptoms go away and then come back. Get help right away if:  You have very bad back pain.  You have very bad pain in your lower belly.  You have a fever.  You are sick to your stomach (nauseous).  You are throwing up. Summary  A urinary tract infection (UTI) is an infection of any part of the urinary tract.  This condition is caused by germs in your genital area.  There are many risk factors for a UTI. These include having a small, thin   tube to drain pee and not being able to control when you pee or poop.  Treatment includes antibiotic medicines for germs.  Drink enough fluid to keep your pee pale yellow. This information is not intended to replace advice given to you by your health care provider. Make sure you discuss any questions you have with your health care provider. Document Revised: 08/31/2018 Document Reviewed: 03/23/2018 Elsevier Patient Education  2020 Elsevier Inc.  

## 2020-01-08 LAB — MICROSCOPIC EXAMINATION
RBC, Urine: NONE SEEN /hpf (ref 0–2)
WBC, UA: >30 /hpf — AB (ref 0–5)

## 2020-01-08 LAB — UA/M W/RFLX CULTURE, ROUTINE
Bilirubin, UA: NEGATIVE
Glucose, UA: NEGATIVE
Ketones, UA: NEGATIVE
Nitrite, UA: POSITIVE — AB
Protein,UA: NEGATIVE
RBC, UA: NEGATIVE
Specific Gravity, UA: 1.025 (ref 1.005–1.030)
Urobilinogen, Ur: 0.2 mg/dL (ref 0.2–1.0)
pH, UA: 5 (ref 5.0–7.5)

## 2020-01-08 LAB — URINE CULTURE, REFLEX

## 2020-01-23 ENCOUNTER — Other Ambulatory Visit: Payer: Self-pay | Admitting: *Deleted

## 2020-01-23 DIAGNOSIS — D638 Anemia in other chronic diseases classified elsewhere: Secondary | ICD-10-CM

## 2020-01-24 ENCOUNTER — Inpatient Hospital Stay (HOSPITAL_BASED_OUTPATIENT_CLINIC_OR_DEPARTMENT_OTHER): Payer: Medicare Other | Admitting: Internal Medicine

## 2020-01-24 ENCOUNTER — Other Ambulatory Visit: Payer: Self-pay

## 2020-01-24 ENCOUNTER — Inpatient Hospital Stay: Payer: Medicare Other | Attending: Internal Medicine

## 2020-01-24 DIAGNOSIS — D72819 Decreased white blood cell count, unspecified: Secondary | ICD-10-CM | POA: Insufficient documentation

## 2020-01-24 DIAGNOSIS — D638 Anemia in other chronic diseases classified elsewhere: Secondary | ICD-10-CM | POA: Insufficient documentation

## 2020-01-24 DIAGNOSIS — Z8261 Family history of arthritis: Secondary | ICD-10-CM | POA: Insufficient documentation

## 2020-01-24 DIAGNOSIS — Z809 Family history of malignant neoplasm, unspecified: Secondary | ICD-10-CM | POA: Insufficient documentation

## 2020-01-24 DIAGNOSIS — I10 Essential (primary) hypertension: Secondary | ICD-10-CM | POA: Insufficient documentation

## 2020-01-24 DIAGNOSIS — I2510 Atherosclerotic heart disease of native coronary artery without angina pectoris: Secondary | ICD-10-CM | POA: Diagnosis not present

## 2020-01-24 DIAGNOSIS — Z8673 Personal history of transient ischemic attack (TIA), and cerebral infarction without residual deficits: Secondary | ICD-10-CM | POA: Insufficient documentation

## 2020-01-24 DIAGNOSIS — I4891 Unspecified atrial fibrillation: Secondary | ICD-10-CM | POA: Diagnosis not present

## 2020-01-24 DIAGNOSIS — Z87891 Personal history of nicotine dependence: Secondary | ICD-10-CM | POA: Diagnosis not present

## 2020-01-24 DIAGNOSIS — D696 Thrombocytopenia, unspecified: Secondary | ICD-10-CM | POA: Diagnosis not present

## 2020-01-24 DIAGNOSIS — Z7982 Long term (current) use of aspirin: Secondary | ICD-10-CM | POA: Diagnosis not present

## 2020-01-24 DIAGNOSIS — Z79899 Other long term (current) drug therapy: Secondary | ICD-10-CM | POA: Diagnosis not present

## 2020-01-24 DIAGNOSIS — Z8249 Family history of ischemic heart disease and other diseases of the circulatory system: Secondary | ICD-10-CM | POA: Insufficient documentation

## 2020-01-24 LAB — CBC WITH DIFFERENTIAL/PLATELET
Abs Immature Granulocytes: 0.03 K/uL (ref 0.00–0.07)
Basophils Absolute: 0 K/uL (ref 0.0–0.1)
Basophils Relative: 1 %
Eosinophils Absolute: 0.1 K/uL (ref 0.0–0.5)
Eosinophils Relative: 2 %
HCT: 38 % (ref 36.0–46.0)
Hemoglobin: 12.2 g/dL (ref 12.0–15.0)
Immature Granulocytes: 1 %
Lymphocytes Relative: 17 %
Lymphs Abs: 0.7 K/uL (ref 0.7–4.0)
MCH: 27.2 pg (ref 26.0–34.0)
MCHC: 32.1 g/dL (ref 30.0–36.0)
MCV: 84.6 fL (ref 80.0–100.0)
Monocytes Absolute: 0.4 K/uL (ref 0.1–1.0)
Monocytes Relative: 9 %
Neutro Abs: 2.7 K/uL (ref 1.7–7.7)
Neutrophils Relative %: 70 %
Platelets: 115 K/uL — ABNORMAL LOW (ref 150–400)
RBC: 4.49 MIL/uL (ref 3.87–5.11)
RDW: 15.5 % (ref 11.5–15.5)
WBC: 3.9 K/uL — ABNORMAL LOW (ref 4.0–10.5)
nRBC: 0 % (ref 0.0–0.2)

## 2020-01-24 LAB — BASIC METABOLIC PANEL
Anion gap: 10 (ref 5–15)
BUN: 29 mg/dL — ABNORMAL HIGH (ref 8–23)
CO2: 26 mmol/L (ref 22–32)
Calcium: 9.3 mg/dL (ref 8.9–10.3)
Chloride: 104 mmol/L (ref 98–111)
Creatinine, Ser: 0.95 mg/dL (ref 0.44–1.00)
GFR calc Af Amer: >60 mL/min (ref >60)
GFR calc non Af Amer: >60 mL/min (ref >60)
Glucose, Bld: 106 mg/dL — ABNORMAL HIGH (ref 70–99)
Potassium: 3.3 mmol/L — ABNORMAL LOW (ref 3.5–5.1)
Sodium: 140 mmol/L (ref 135–145)

## 2020-01-24 NOTE — Progress Notes (Signed)
[place] CONSULT NOTE  Patient Care Team: Valerie Roys, DO as PCP - General (Family Medicine) Ubaldo Glassing Javier Docker, MD as Consulting Physician (Cardiology) Carloyn Manner, MD as Referring Physician (Otolaryngology) Minor, Dalbert Garnet, RN (Inactive) as [facility]  CHIEF COMPLAINTS/PURPOSE OF CONSULTATION:   # Anemia of chronic disease/mild intermittent leucopenia/thromboctyopenia? Autoimmune- EGD/ [place] [Dr.Wohl; 2017] chronic back pain [? 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     HISTORY OF PRESENTING ILLNESS:  Kristina Henson 64 y.o.  female noted Moderate anemia of Unclear etiology- iron deficiency versus chronic disease is here for follow-up.  Patient denies any blood in stools or black or stools.  No nausea vomiting.  No new onset of joint pains.  Chronic mild pain.  Review of Systems  Constitutional: Positive for malaise/fatigue. Negative for chills, diaphoresis, fever and weight loss.  HENT: Negative for nosebleeds and sore throat.   Eyes: Negative for double vision.  Respiratory: Negative for cough, hemoptysis, sputum production, shortness of breath and wheezing.   Cardiovascular: Negative for chest pain, palpitations, orthopnea and leg swelling.  Gastrointestinal: Negative for abdominal pain, blood in stool, constipation, diarrhea, heartburn, melena, nausea and vomiting.  Genitourinary: Negative for dysuria, frequency and urgency.  Musculoskeletal: Positive for joint pain. Negative for back pain.  Skin: Negative.  Negative for itching and rash.  Neurological: Negative for dizziness, tingling, focal weakness, weakness and headaches.  Endo/Heme/Allergies: Does not bruise/bleed easily.   Psychiatric/Behavioral: Negative for depression. The patient is not nervous/anxious and does not have insomnia.      MEDICAL HISTORY:  Past Medical History:  Diagnosis Date  . Anemia   . Arthritis    BACK-LUMBAR  . Atrial fibrillation ([place])   . Coronary artery disease   . Duodenitis   . Dysrhythmia   . Gastritis   . Hypertension   . Mass in neck    lt side  . Pneumonia 2016  . Stroke ([place]) 2012  . Stroke [facility]) 2012    SURGICAL HISTORY: Past Surgical History:  Procedure Laterality Date  . ABDOMINAL HYSTERECTOMY     Total  . cardiac catherization    . COLONOSCOPY WITH PROPOFOL N/A 11/18/2015   Procedure: COLONOSCOPY WITH PROPOFOL;  Surgeon: Lucilla Lame, MD;  Location: [facility] ENDOSCOPY;  Service: Endoscopy;  Laterality: N/A;  . CORONARY ANGIOPLASTY    . ESOPHAGOGASTRODUODENOSCOPY (EGD) WITH PROPOFOL N/A 04/23/2016   Procedure: ESOPHAGOGASTRODUODENOSCOPY (EGD) WITH PROPOFOL;  Surgeon: Lucilla Lame, MD;  Location: [place];  Service: Endoscopy;  Laterality: N/A;  small bowel bx gastric antrum bx  . SUBMANDIBULAR GLAND EXCISION Left 09/08/2017   Procedure: EXCISION SUBMANDIBULAR GLAND;  Surgeon: Carloyn Manner, MD;  Location: [facility] ORS;  Service: ENT;  Laterality: Left;  . throat biopsy      SOCIAL HISTORY: Social History   Socioeconomic History  . Marital status: Divorced    Spouse name: Not on file  . Number of children: Not on file  . Years of education: 46  . Highest education level: 12th grade  Occupational History  . Occupation: disability   Tobacco Use  . Smoking status: Former Smoker    Quit date: 1978    Years since quitting: 43.3  . Smokeless tobacco: Never Used  . Tobacco comment: didnt smoke much  Substance and Sexual Activity  . Alcohol use: Not Currently  . Drug use: No  . Sexual activity: Yes  Other Topics Concern  . Not on file  Social History Narrative  . Not on file   Social Determinants of Health   Financial Resource Strain:    . Difficulty of Paying Living Expenses:   Food Insecurity:   . Worried About Charity fundraiser in the Last Year:   . Arboriculturist in the Last Year:   Transportation Needs:   . Film/video editor (Medical):   Marland Kitchen Lack of Transportation (Non-Medical):   Physical Activity:   . Days of Exercise per Week:   . Minutes of Exercise per Session:   Stress:   . Feeling of Stress :   Social Connections:   . Frequency of Communication with Friends and Family:   . Frequency of Social Gatherings with Friends and Family:   . Attends Religious Services:   . Active Member of Clubs or Organizations:   . Attends Archivist Meetings:   Marland Kitchen Marital Status:   Intimate Partner Violence:   . Fear of Current or Ex-Partner:   . Emotionally Abused:   Marland Kitchen Physically Abused:   . Sexually Abused:     FAMILY HISTORY: Family History  Problem Relation Age of Onset  . Arthritis Mother   . Cancer Mother   . Hypertension Sister   . Asthma Brother   . Dementia Brother   . Breast cancer Neg Hx   . Kidney cancer Neg Hx   . Bladder Cancer Neg Hx     ALLERGIES:  is allergic to amoxicillin.  MEDICATIONS:  Current Outpatient Medications  Medication Sig Dispense Refill  . amiodarone (PACERONE) 200 MG tablet Take 1 tablet (200 mg total) by mouth every morning. 30 tablet 0  . amLODipine (NORVASC) 5 MG tablet Take 1 tablet (5 mg total) by mouth daily. 90 tablet 1  . Ascorbic Acid (VITAMIN C) 100 MG tablet Take 100 mg by mouth daily.    Marland Kitchen aspirin EC 81 MG tablet Take 81 mg by mouth daily.     . ferrous sulfate 325 (65 FE) MG EC tablet Take 1 tablet (325 mg total) by mouth 3 (three) times daily with meals. 270 tablet 1  . hydrALAZINE (APRESOLINE) 50 MG tablet Take 1 tablet (50 mg total) by mouth 2 (two) times daily. 180 tablet 1  . lisinopril-hydrochlorothiazide (ZESTORETIC) 20-25 MG tablet Take 1 tablet by mouth 2 (two) times daily. 180 tablet 1  . Multiple Vitamins-Minerals (MULTIVITAMIN WITH  MINERALS) tablet Take 1 tablet by mouth daily.    . valACYclovir (VALTREX) 1000 MG tablet Take 1 tablet (1,000 mg total) by mouth 2 (two) times daily. 20 tablet 0  . VITAMIN D PO Take by mouth daily.     No current facility-administered medications for this visit.      Marland Kitchen  PHYSICAL EXAMINATION: ECOG PERFORMANCE STATUS: 1 - Symptomatic but completely ambulatory  Vitals:   01/24/20 0958  BP: 113/78  Pulse: 80  Temp: (!) 96.5 F (35.8 C)   Filed Weights   01/24/20 0958  Weight: 166 lb 8 oz (75.5 kg)    Physical Exam  Constitutional: She is oriented to person, place, and time and well-developed, well-nourished, and in no distress.  HENT:  Head: Normocephalic and atraumatic.  Mouth/Throat: Oropharynx is clear and moist. No oropharyngeal exudate.  Eyes: Pupils are equal, round, and reactive to light.  Cardiovascular: Normal rate  and regular rhythm.  Pulmonary/Chest: No respiratory distress. She has no wheezes.  Abdominal: Soft. Bowel sounds are normal. She exhibits no distension and no mass. There is no abdominal tenderness. There is no rebound and no guarding.  Musculoskeletal:        General: No tenderness or edema. Normal range of motion.     Cervical back: Normal range of motion and neck supple.  Neurological: She is alert and oriented to person, place, and time.  Skin: Skin is warm.  Psychiatric: Affect normal.     LABORATORY DATA:  I have reviewed the data as listed Lab Results  Component Value Date   WBC 3.9 (L) 01/24/2020   HGB 12.2 01/24/2020   HCT 38.0 01/24/2020   MCV 84.6 01/24/2020   PLT 115 (L) 01/24/2020   Recent Labs    04/27/19 1056 04/27/19 1056 07/26/19 0951 11/02/19 1459 01/24/20 0937  NA 141  --  142 141 140  K 3.8   < > 3.5 4.4 3.3*  CL 99   < > 104 104 104  CO2 24   < > 27 22 26   GLUCOSE 109*  --  100* 100* 106*  BUN 25  --  29* 25 29*  CREATININE 0.93   < > 0.81 1.03* 0.95  CALCIUM 9.5   < > 9.5 9.6 9.3  GFRNONAA 67   < > >60 59*  >60  GFRAA 77   < > >60 68 >60  PROT 7.3  --   --  7.0  --   ALBUMIN 4.3  --   --  3.8  --   AST 32  --   --  37  --   ALT 53*  --   --  62*  --   ALKPHOS 88  --   --  96  --   BILITOT 0.4  --   --  0.3  --    < > = values in this interval not displayed.    RADIOGRAPHIC STUDIES: I have personally reviewed the radiological images as listed and agreed with the findings in the report. No results found.  ASSESSMENT & PLAN:   Anemia due to chronic illness # Anemia- likely IDA vs anemia of chronic disease  [unclear etiology]- s/p IV iron; improved. Hb ~12; continue PO iron once a day.    #Mild asymptomatic leukopenia/intermittent neutropenia-STABLE>   #Mild intermittent thrombocytopenia-today 115  Stable. ? Autoimmune process.    # DISPOSITION:  # follow up in 12 months-MD/cbc/bmp/iron studies/ferritin- Dr.B    All questions were answered. The patient knows to call the clinic with any problems, questions or concerns.     Kristina Sickle, MD 01/27/2020 4:40 PM

## 2020-01-24 NOTE — Assessment & Plan Note (Addendum)
#   Anemia- likely IDA vs anemia of chronic disease  [unclear etiology]- s/p IV iron; improved. Hb ~12; continue PO iron once a day.    #Mild asymptomatic leukopenia/intermittent neutropenia-STABLE>   #Mild intermittent thrombocytopenia-today 115  Stable. ? Autoimmune process.    # DISPOSITION:  # follow up in 12 months-MD/cbc/bmp/iron studies/ferritin- Dr.B

## 2020-02-12 ENCOUNTER — Encounter: Payer: Self-pay | Admitting: Family Medicine

## 2020-02-12 ENCOUNTER — Ambulatory Visit (INDEPENDENT_AMBULATORY_CARE_PROVIDER_SITE_OTHER): Payer: Medicare Other | Admitting: Family Medicine

## 2020-02-12 ENCOUNTER — Other Ambulatory Visit: Payer: Self-pay

## 2020-02-12 VITALS — BP 125/77 | HR 110 | Temp 99.3°F

## 2020-02-12 DIAGNOSIS — R768 Other specified abnormal immunological findings in serum: Secondary | ICD-10-CM

## 2020-02-12 DIAGNOSIS — N3000 Acute cystitis without hematuria: Secondary | ICD-10-CM | POA: Diagnosis not present

## 2020-02-12 DIAGNOSIS — R300 Dysuria: Secondary | ICD-10-CM | POA: Diagnosis not present

## 2020-02-12 MED ORDER — CIPROFLOXACIN HCL 500 MG PO TABS: 500.0000 mg | ORAL_TABLET | Freq: Two times a day (BID) | ORAL | 0 refills | Status: DC

## 2020-02-12 NOTE — Assessment & Plan Note (Signed)
Given R CVA tenderness will treat with cipro. Call if not better by Thursday. Continue to monitor.

## 2020-02-12 NOTE — Progress Notes (Signed)
BP 125/77 (BP Location: Left Arm, Patient Position: Sitting, Cuff Size: Normal)   Pulse (!) 110   Temp 99.3 F (37.4 C) (Oral)   SpO2 98%    Subjective:    Patient ID: Kristina Henson, female    DOB: [birth year], 64 y.o.   MRN: [id number]  HPI: Kristina Henson is a 64 y.o. female  Chief Complaint  Patient presents with  . urine smell  . Back Pain  . cloudy urine  . Flank Pain   URINARY SYMPTOMS Duration: 1 week Dysuria: burning Urinary frequency: yes Urgency: yes Small volume voids: yes Symptom severity: moderate Urinary incontinence: no Foul odor: yes Hematuria: no Abdominal pain: no Back pain: yes Suprapubic pain/pressure: no Flank pain: yes Fever:  no Vomiting: no Relief with cranberry juice: no Relief with pyridium: no Status: better Previous urinary tract infection: yes Recurrent urinary tract infection: no History of sexually transmitted disease: no Vaginal discharge: no Treatments attempted: cranberry and increasing fluids   Relevant past medical, surgical, family and social history reviewed and updated as indicated. Interim medical history since our last visit reviewed. Allergies and medications reviewed and updated.  Review of Systems  Constitutional: Negative.   Respiratory: Negative.   Cardiovascular: Negative.   Gastrointestinal: Negative.   Genitourinary: Positive for dysuria, frequency and urgency. Negative for decreased urine volume, difficulty urinating, dyspareunia, enuresis, flank pain, genital sores, hematuria, menstrual problem, pelvic pain, vaginal bleeding, vaginal discharge and vaginal pain.  Musculoskeletal: Positive for gait problem and myalgias. Negative for arthralgias, back pain, joint swelling, neck pain and neck stiffness.  Skin: Negative.   Psychiatric/Behavioral: Negative.     Per HPI unless specifically indicated above     Objective:    BP 125/77 (BP Location: Left Arm, Patient Position: Sitting, Cuff Size: Normal)    Pulse (!) 110   Temp 99.3 F (37.4 C) (Oral)   SpO2 98%   Wt Readings from Last 3 Encounters:  01/24/20 166 lb 8 oz (75.5 kg)  01/02/20 167 lb 9.6 oz (76 kg)  12/19/19 168 lb (76.2 kg)    Physical Exam Vitals and nursing note reviewed.  Constitutional:      General: She is not in acute distress.    Appearance: Normal appearance. She is not ill-appearing, toxic-appearing or diaphoretic.  HENT:     Head: Normocephalic and atraumatic.     Right Ear: External ear normal.     Left Ear: External ear normal.     Nose: Nose normal.     Mouth/Throat:     Mouth: Mucous membranes are moist.     Pharynx: Oropharynx is clear.  Eyes:     General: No scleral icterus.       Right eye: No discharge.        Left eye: No discharge.     Extraocular Movements: Extraocular movements intact.     Conjunctiva/sclera: Conjunctivae normal.     Pupils: Pupils are equal, round, and reactive to light.  Cardiovascular:     Rate and Rhythm: Normal rate and regular rhythm.     Pulses: Normal pulses.     Heart sounds: Normal heart sounds. No murmur. No friction rub. No gallop.   Pulmonary:     Effort: Pulmonary effort is normal. No respiratory distress.     Breath sounds: Normal breath sounds. No stridor. No wheezing, rhonchi or rales.  Chest:     Chest wall: No tenderness.  Abdominal:     General: Abdomen is flat. Bowel sounds  are normal. There is no distension.     Palpations: Abdomen is soft. There is no mass.     Tenderness: There is no abdominal tenderness. There is right CVA tenderness. There is no left CVA tenderness, guarding or rebound.     Hernia: No hernia is present.  Musculoskeletal:        General: Normal range of motion.     Cervical back: Normal range of motion and neck supple.  Skin:    General: Skin is warm and dry.     Capillary Refill: Capillary refill takes less than 2 seconds.     Coloration: Skin is not jaundiced or pale.     Findings: No bruising, erythema, lesion or rash.   Neurological:     General: No focal deficit present.     Mental Status: She is alert and oriented to person, place, and time. Mental status is at baseline.  Psychiatric:        Mood and Affect: Mood normal.        Behavior: Behavior normal.        Thought Content: Thought content normal.        Judgment: Judgment normal.     Results for orders placed or performed in visit on [id number]  Basic metabolic panel  Result Value Ref Range   Sodium 140 135 - 145 mmol/L   Potassium 3.3 (L) 3.5 - 5.1 mmol/L   Chloride 104 98 - 111 mmol/L   CO2 26 22 - 32 mmol/L   Glucose, Bld 106 (H) 70 - 99 mg/dL   BUN 29 (H) 8 - 23 mg/dL   Creatinine, Ser 0.95 0.44 - 1.00 mg/dL   Calcium 9.3 8.9 - 10.3 mg/dL   GFR calc non Af Amer >60 >60 mL/min   GFR calc Af Amer >60 >60 mL/min   Anion gap 10 5 - 15  CBC with Differential  Result Value Ref Range   WBC 3.9 (L) 4.0 - 10.5 K/uL   RBC 4.49 3.87 - 5.11 MIL/uL   Hemoglobin 12.2 12.0 - 15.0 g/dL   HCT 38.0 36.0 - 46.0 %   MCV 84.6 80.0 - 100.0 fL   MCH 27.2 26.0 - 34.0 pg   MCHC 32.1 30.0 - 36.0 g/dL   RDW 15.5 11.5 - 15.5 %   Platelets 115 (L) 150 - 400 K/uL   nRBC 0.0 0.0 - 0.2 %   Neutrophils Relative % 70 %   Neutro Abs 2.7 1.7 - 7.7 K/uL   Lymphocytes Relative 17 %   Lymphs Abs 0.7 0.7 - 4.0 K/uL   Monocytes Relative 9 %   Monocytes Absolute 0.4 0.1 - 1.0 K/uL   Eosinophils Relative 2 %   Eosinophils Absolute 0.1 0.0 - 0.5 K/uL   Basophils Relative 1 %   Basophils Absolute 0.0 0.0 - 0.1 K/uL   Immature Granulocytes 1 %   Abs Immature Granulocytes 0.03 0.00 - 0.07 K/uL      Assessment & Plan:   Problem List Items Addressed This Visit      Genitourinary   Acute cystitis without hematuria - Primary    Given R CVA tenderness will treat with cipro. Call if not better by Thursday. Continue to monitor.       Other Visit Diagnoses    Dysuria       2+ leuks. Will treat while awaiting culture. Call with any concerns.    Relevant Orders    UA/M w/rflx Culture, Routine   HSV-2  seropositive       Discussed that as she's never had an outbreak, she may never have one. Call if she notices a painful rash. Continue ot monitor.        Follow up plan: Return As scheduled.

## 2020-02-15 LAB — UA/M W/RFLX CULTURE, ROUTINE
Bilirubin, UA: NEGATIVE
Glucose, UA: NEGATIVE
Ketones, UA: NEGATIVE
Nitrite, UA: NEGATIVE
RBC, UA: NEGATIVE
Specific Gravity, UA: 1.02 (ref 1.005–1.030)
Urobilinogen, Ur: 0.2 mg/dL (ref 0.2–1.0)
pH, UA: 5 (ref 5.0–7.5)

## 2020-02-15 LAB — MICROSCOPIC EXAMINATION: RBC, Urine: NONE SEEN /hpf (ref 0–2)

## 2020-02-15 LAB — URINE CULTURE, REFLEX

## 2020-04-28 DIAGNOSIS — I10 Essential (primary) hypertension: Secondary | ICD-10-CM | POA: Diagnosis not present

## 2020-04-28 DIAGNOSIS — I480 Paroxysmal atrial fibrillation: Secondary | ICD-10-CM | POA: Diagnosis not present

## 2020-04-28 DIAGNOSIS — E7801 Familial hypercholesterolemia: Secondary | ICD-10-CM | POA: Diagnosis not present

## 2020-04-28 DIAGNOSIS — I639 Cerebral infarction, unspecified: Secondary | ICD-10-CM | POA: Diagnosis not present

## 2020-04-28 DIAGNOSIS — I700 Atherosclerosis of aorta: Secondary | ICD-10-CM | POA: Diagnosis not present

## 2020-05-01 ENCOUNTER — Encounter: Payer: Self-pay | Admitting: Unknown Physician Specialty

## 2020-05-01 ENCOUNTER — Ambulatory Visit (INDEPENDENT_AMBULATORY_CARE_PROVIDER_SITE_OTHER): Payer: Medicare Other | Admitting: Unknown Physician Specialty

## 2020-05-01 ENCOUNTER — Ambulatory Visit: Payer: Medicare Other | Admitting: Family Medicine

## 2020-05-01 ENCOUNTER — Other Ambulatory Visit: Payer: Self-pay

## 2020-05-01 VITALS — BP 127/77 | HR 62 | Temp 98.2°F | Wt 165.8 lb

## 2020-05-01 DIAGNOSIS — D500 Iron deficiency anemia secondary to blood loss (chronic): Secondary | ICD-10-CM

## 2020-05-01 DIAGNOSIS — I4891 Unspecified atrial fibrillation: Secondary | ICD-10-CM

## 2020-05-01 DIAGNOSIS — E782 Mixed hyperlipidemia: Secondary | ICD-10-CM

## 2020-05-01 DIAGNOSIS — I129 Hypertensive chronic kidney disease with stage 1 through stage 4 chronic kidney disease, or unspecified chronic kidney disease: Secondary | ICD-10-CM

## 2020-05-01 MED ORDER — LISINOPRIL-HYDROCHLOROTHIAZIDE 20-25 MG PO TABS: 1.0000 | ORAL_TABLET | Freq: Two times a day (BID) | ORAL | 1 refills | Status: DC

## 2020-05-01 MED ORDER — AMLODIPINE BESYLATE 5 MG PO TABS: 5.0000 mg | ORAL_TABLET | Freq: Every day | ORAL | 1 refills | Status: DC

## 2020-05-01 MED ORDER — HYDRALAZINE HCL 50 MG PO TABS: 50.0000 mg | ORAL_TABLET | Freq: Two times a day (BID) | ORAL | 1 refills | Status: DC

## 2020-05-01 NOTE — Assessment & Plan Note (Signed)
Rate controlled.  Continue preset medications and f/u with Dr. Ubaldo Glassing

## 2020-05-01 NOTE — Assessment & Plan Note (Signed)
Per Hematology.  Reviewed notes and being followed

## 2020-05-01 NOTE — Assessment & Plan Note (Signed)
Stable, continue present medications.   

## 2020-05-01 NOTE — Progress Notes (Signed)
BP 127/77 (BP Location: Left Arm, Patient Position: Sitting, Cuff Size: Normal)   Pulse 62   Temp 98.2 F (36.8 C) (Oral)   Wt 165 lb 12.8 oz (75.2 kg)   SpO2 98%   BMI 30.33 kg/m    Subjective:    Patient ID: Kristina Henson, female    DOB: 1955-11-20, 64 y.o.   MRN: [id number]  HPI: Kristina Henson is a 64 y.o. female  Chief Complaint  Patient presents with  . Follow-up    6 month   Hypertension with ckd Using medications without difficulty Average home BPs   No problems or lightheadedness No chest pain with exertion or shortness of breath No Edema   Hyperlipidemia Using medications without problems No Muscle aches  Diet compliance: Eats well Exercise:water fitness Last LDL is 116  Afib Rate controlled.  Takes Paceron through Dr. Ubaldo Glassing who she saw recently  Anemia Followed by hematology   Relevant past medical, surgical, family and social history reviewed and updated as indicated. Interim medical history since our last visit reviewed. Allergies and medications reviewed and updated.  Review of Systems  Per HPI unless specifically indicated above     Objective:    BP 127/77 (BP Location: Left Arm, Patient Position: Sitting, Cuff Size: Normal)   Pulse 62   Temp 98.2 F (36.8 C) (Oral)   Wt 165 lb 12.8 oz (75.2 kg)   SpO2 98%   BMI 30.33 kg/m   Wt Readings from Last 3 Encounters:  05/01/20 165 lb 12.8 oz (75.2 kg)  01/24/20 166 lb 8 oz (75.5 kg)  01/02/20 167 lb 9.6 oz (76 kg)    Physical Exam Constitutional:      General: She is not in acute distress.    Appearance: Normal appearance. She is well-developed.  HENT:     Head: Normocephalic and atraumatic.  Eyes:     General: Lids are normal. No scleral icterus.       Right eye: No discharge.        Left eye: No discharge.     Conjunctiva/sclera: Conjunctivae normal.  Neck:     Vascular: No carotid bruit or JVD.  Cardiovascular:     Rate and Rhythm: Normal rate. Rhythm irregular.     Heart  sounds: Normal heart sounds.  Pulmonary:     Effort: Pulmonary effort is normal.     Breath sounds: Normal breath sounds.  Abdominal:     Palpations: There is no hepatomegaly or splenomegaly.  Musculoskeletal:        General: Normal range of motion.     Cervical back: Normal range of motion and neck supple.  Skin:    General: Skin is warm and dry.     Coloration: Skin is not pale.     Findings: No rash.  Neurological:     Mental Status: She is alert and oriented to person, place, and time.  Psychiatric:        Behavior: Behavior normal.        Thought Content: Thought content normal.        Judgment: Judgment normal.     Results for orders placed or performed in visit on 02/12/20  Microscopic Examination   URINE  Result Value Ref Range   WBC, UA 11-30 (A) 0 - 5 /hpf   RBC None seen 0 - 2 /hpf   Epithelial Cells (non renal) 0-10 0 - 10 /hpf   Bacteria, UA Many (A) None seen/Few  Urine  Culture, Reflex   URINE  Result Value Ref Range   Urine Culture, Routine Final report (A)    Organism ID, Bacteria Escherichia coli (A)    Antimicrobial Susceptibility Comment   UA/M w/rflx Culture, Routine   Specimen: Urine   URINE  Result Value Ref Range   Specific Gravity, UA 1.020 1.005 - 1.030   pH, UA 5.0 5.0 - 7.5   Color, UA Yellow Yellow   Appearance Ur Cloudy (A) Clear   Leukocytes,UA 2+ (A) Negative   Protein,UA 1+ (A) Negative/Trace   Glucose, UA Negative Negative   Ketones, UA Negative Negative   RBC, UA Negative Negative   Bilirubin, UA Negative Negative   Urobilinogen, Ur 0.2 0.2 - 1.0 mg/dL   Nitrite, UA Negative Negative   Microscopic Examination See below:    Urinalysis Reflex Comment       Assessment & Plan:   Problem List Items Addressed This Visit      Unprioritized   Atrial fibrillation ([place])    Rate controlled.  Continue preset medications and f/u with Dr. Ubaldo Glassing      Relevant Medications   lisinopril-hydrochlorothiazide (ZESTORETIC) 20-25 MG tablet     amLODipine (NORVASC) 5 MG tablet   hydrALAZINE (APRESOLINE) 50 MG tablet   Benign hypertensive renal disease - Primary    Stable, continue present medications.        Relevant Orders   Comprehensive metabolic panel   HLD (hyperlipidemia)    Stable, continue present medications.        Relevant Medications   lisinopril-hydrochlorothiazide (ZESTORETIC) 20-25 MG tablet   amLODipine (NORVASC) 5 MG tablet   hydrALAZINE (APRESOLINE) 50 MG tablet   Iron deficiency anemia due to chronic blood loss    Per Hematology.  Reviewed notes and being followed          Follow up plan: Return in about 6 months (around 11/01/2020), or Due for PE then.

## 2020-05-02 ENCOUNTER — Encounter: Payer: Self-pay | Admitting: Unknown Physician Specialty

## 2020-05-02 LAB — COMPREHENSIVE METABOLIC PANEL
ALT: 28 IU/L (ref 0–32)
AST: 23 IU/L (ref 0–40)
Albumin/Globulin Ratio: 1.1 — ABNORMAL LOW (ref 1.2–2.2)
Albumin: 4.1 g/dL (ref 3.8–4.8)
Alkaline Phosphatase: 88 IU/L (ref 48–121)
BUN/Creatinine Ratio: 24 (ref 12–28)
BUN: 25 mg/dL (ref 8–27)
Bilirubin Total: 0.7 mg/dL (ref 0.0–1.2)
CO2: 25 mmol/L (ref 20–29)
Calcium: 9.7 mg/dL (ref 8.7–10.3)
Chloride: 101 mmol/L (ref 96–106)
Creatinine, Ser: 1.03 mg/dL — ABNORMAL HIGH (ref 0.57–1.00)
GFR calc Af Amer: 67 mL/min/{1.73_m2} (ref >59)
GFR calc non Af Amer: 58 mL/min/{1.73_m2} — ABNORMAL LOW (ref >59)
Globulin, Total: 3.6 g/dL (ref 1.5–4.5)
Glucose: 103 mg/dL — ABNORMAL HIGH (ref 65–99)
Potassium: 4.1 mmol/L (ref 3.5–5.2)
Sodium: 141 mmol/L (ref 134–144)
Total Protein: 7.7 g/dL (ref 6.0–8.5)

## 2020-07-15 ENCOUNTER — Ambulatory Visit: Payer: Medicare Other | Admitting: Family Medicine

## 2020-07-17 ENCOUNTER — Ambulatory Visit: Payer: Medicare Other | Admitting: Unknown Physician Specialty

## 2020-07-24 ENCOUNTER — Encounter: Payer: Self-pay | Admitting: Intensive Care

## 2020-07-24 ENCOUNTER — Other Ambulatory Visit: Payer: Self-pay

## 2020-07-24 ENCOUNTER — Emergency Department
Admission: EM | Admit: 2020-07-24 | Discharge: 2020-07-24 | Disposition: A | Discharge status: Home / Self Care | Payer: Medicare Other | Attending: Emergency Medicine | Admitting: Emergency Medicine

## 2020-07-24 ENCOUNTER — Emergency Department: Payer: Medicare Other

## 2020-07-24 ENCOUNTER — Ambulatory Visit: Payer: Self-pay | Admitting: *Deleted

## 2020-07-24 DIAGNOSIS — J811 Chronic pulmonary edema: Secondary | ICD-10-CM | POA: Diagnosis not present

## 2020-07-24 DIAGNOSIS — J189 Pneumonia, unspecified organism: Secondary | ICD-10-CM | POA: Insufficient documentation

## 2020-07-24 DIAGNOSIS — Z87891 Personal history of nicotine dependence: Secondary | ICD-10-CM | POA: Diagnosis not present

## 2020-07-24 DIAGNOSIS — Z86010 Personal history of colonic polyps: Secondary | ICD-10-CM | POA: Diagnosis not present

## 2020-07-24 DIAGNOSIS — I2721 Secondary pulmonary arterial hypertension: Secondary | ICD-10-CM

## 2020-07-24 DIAGNOSIS — I2510 Atherosclerotic heart disease of native coronary artery without angina pectoris: Secondary | ICD-10-CM | POA: Insufficient documentation

## 2020-07-24 DIAGNOSIS — Z951 Presence of aortocoronary bypass graft: Secondary | ICD-10-CM | POA: Insufficient documentation

## 2020-07-24 DIAGNOSIS — I10 Essential (primary) hypertension: Secondary | ICD-10-CM | POA: Insufficient documentation

## 2020-07-24 DIAGNOSIS — R52 Pain, unspecified: Secondary | ICD-10-CM | POA: Diagnosis not present

## 2020-07-24 DIAGNOSIS — R059 Cough, unspecified: Secondary | ICD-10-CM | POA: Diagnosis present

## 2020-07-24 DIAGNOSIS — Z79899 Other long term (current) drug therapy: Secondary | ICD-10-CM | POA: Diagnosis not present

## 2020-07-24 DIAGNOSIS — Z7982 Long term (current) use of aspirin: Secondary | ICD-10-CM | POA: Diagnosis not present

## 2020-07-24 DIAGNOSIS — Z20822 Contact with and (suspected) exposure to covid-19: Secondary | ICD-10-CM | POA: Diagnosis not present

## 2020-07-24 DIAGNOSIS — R0602 Shortness of breath: Secondary | ICD-10-CM | POA: Insufficient documentation

## 2020-07-24 DIAGNOSIS — I517 Cardiomegaly: Secondary | ICD-10-CM | POA: Diagnosis not present

## 2020-07-24 DIAGNOSIS — R531 Weakness: Secondary | ICD-10-CM | POA: Diagnosis not present

## 2020-07-24 DIAGNOSIS — J181 Lobar pneumonia, unspecified organism: Secondary | ICD-10-CM | POA: Diagnosis not present

## 2020-07-24 DIAGNOSIS — I959 Hypotension, unspecified: Secondary | ICD-10-CM | POA: Diagnosis not present

## 2020-07-24 DIAGNOSIS — I700 Atherosclerosis of aorta: Secondary | ICD-10-CM | POA: Diagnosis not present

## 2020-07-24 DIAGNOSIS — J90 Pleural effusion, not elsewhere classified: Secondary | ICD-10-CM | POA: Diagnosis not present

## 2020-07-24 DIAGNOSIS — R069 Unspecified abnormalities of breathing: Secondary | ICD-10-CM | POA: Diagnosis not present

## 2020-07-24 HISTORY — DX: Secondary pulmonary arterial hypertension: I27.21

## 2020-07-24 LAB — BRAIN NATRIURETIC PEPTIDE: B Natriuretic Peptide: 78.3 pg/mL (ref 0.0–100.0)

## 2020-07-24 LAB — BASIC METABOLIC PANEL
Anion gap: 10 (ref 5–15)
BUN: 23 mg/dL (ref 8–23)
CO2: 25 mmol/L (ref 22–32)
Calcium: 9.1 mg/dL (ref 8.9–10.3)
Chloride: 101 mmol/L (ref 98–111)
Creatinine, Ser: 0.86 mg/dL (ref 0.44–1.00)
GFR, Estimated: >60 mL/min (ref >60)
Glucose, Bld: 135 mg/dL — ABNORMAL HIGH (ref 70–99)
Potassium: 3.5 mmol/L (ref 3.5–5.1)
Sodium: 136 mmol/L (ref 135–145)

## 2020-07-24 LAB — CBC
HCT: 33.1 % — ABNORMAL LOW (ref 36.0–46.0)
Hemoglobin: 10.9 g/dL — ABNORMAL LOW (ref 12.0–15.0)
MCH: 27.3 pg (ref 26.0–34.0)
MCHC: 32.9 g/dL (ref 30.0–36.0)
MCV: 83 fL (ref 80.0–100.0)
Platelets: 136 10*3/uL — ABNORMAL LOW (ref 150–400)
RBC: 3.99 MIL/uL (ref 3.87–5.11)
RDW: 15.1 % (ref 11.5–15.5)
WBC: 6.8 10*3/uL (ref 4.0–10.5)
nRBC: 0.3 % — ABNORMAL HIGH (ref 0.0–0.2)

## 2020-07-24 LAB — RESPIRATORY PANEL BY RT PCR (FLU A&B, COVID)
Influenza A by PCR: NEGATIVE
Influenza B by PCR: NEGATIVE
SARS Coronavirus 2 by RT PCR: NEGATIVE

## 2020-07-24 LAB — TROPONIN I (HIGH SENSITIVITY): Troponin I (High Sensitivity): 8 ng/L (ref <18)

## 2020-07-24 MED ORDER — ALBUTEROL SULFATE HFA 108 (90 BASE) MCG/ACT IN AERS
2.0000 | INHALATION_SPRAY | Freq: Once | RESPIRATORY_TRACT | Status: AC
  Administered 2020-07-24: 2 via RESPIRATORY_TRACT
  Filled 2020-07-24: qty 6.7

## 2020-07-24 MED ORDER — DOXYCYCLINE HYCLATE 100 MG PO CAPS: 100.0000 mg | ORAL_CAPSULE | Freq: Two times a day (BID) | ORAL | 0 refills | Status: AC

## 2020-07-24 MED ORDER — SODIUM CHLORIDE 0.9 % IV SOLN
500.0000 mg | Freq: Once | INTRAVENOUS | Status: DC
  Administered 2020-07-24: 500 mg via INTRAVENOUS
  Filled 2020-07-24: qty 500

## 2020-07-24 MED ORDER — IOHEXOL 300 MG/ML  SOLN
75.0000 mL | Freq: Once | INTRAMUSCULAR | Status: AC | PRN
  Administered 2020-07-24: 75 mL via INTRAVENOUS
  Filled 2020-07-24: qty 75

## 2020-07-24 MED ORDER — SODIUM CHLORIDE 0.9 % IV BOLUS
500.0000 mL | Freq: Once | INTRAVENOUS | Status: AC
  Administered 2020-07-24: 500 mL via INTRAVENOUS

## 2020-07-24 MED ORDER — AZITHROMYCIN 250 MG PO TABS: ORAL_TABLET | ORAL | 0 refills | Status: AC

## 2020-07-24 MED ORDER — DOXYCYCLINE HYCLATE 100 MG PO TABS
100.0000 mg | ORAL_TABLET | Freq: Once | ORAL | Status: AC
  Administered 2020-07-24: 100 mg via ORAL
  Filled 2020-07-24: qty 1

## 2020-07-24 MED ORDER — ACETAMINOPHEN 325 MG PO TABS
650.0000 mg | ORAL_TABLET | Freq: Once | ORAL | Status: AC
  Administered 2020-07-24: 650 mg via ORAL
  Filled 2020-07-24: qty 2

## 2020-07-24 NOTE — Telephone Encounter (Signed)
Patient calls with headache yesterday, SOB and non productive cough today. Chills (unsure of fever or not) feels weak in the legs. Audible wheezing and speaking in phrases noted during our call. Advised covid testing and evaluation for SOB and wheezing at UC/ED at this time. She will try to find transportation, if not she will utilize 911.   Reason for Disposition  [1] MODERATE difficulty breathing (e.g., speaks in phrases, SOB even at rest, pulse 100-120) AND [2] NEW-onset or WORSE than normal  Answer Assessment - Initial Assessment Questions 1. RESPIRATORY STATUS: "Describe your breathing?" (e.g., wheezing, shortness of breath, unable to speak, severe coughing)      short of breath while resting. Onset of wheezing last night, non productive cough, feels weak in the legs, chills 2. ONSET: "When did this breathing problem begin?"      Yesterday 3. PATTERN "Does the difficult breathing come and go, or has it been constant since it started?"      Comes and goes over the last 24 hours 4. SEVERITY: "How bad is your breathing?" (e.g., mild, moderate, severe)    - MILD: No SOB at rest, mild SOB with walking, speaks normally in sentences, can lay down, no retractions, pulse < 100.    - MODERATE: SOB at rest, SOB with minimal exertion and prefers to sit, cannot lie down flat, speaks in phrases, mild retractions, audible wheezing, pulse 100-120.    - SEVERE: Very SOB at rest, speaks in single words, struggling to breathe, sitting hunched forward, retractions, pulse > 120      Unsure of heart rate. Audible wheezing and speaks in phrases noted during our conversation. 5. RECURRENT SYMPTOM: "Have you had difficulty breathing before?" If Yes, ask: "When was the last time?" and "What happened that time?"      no 6. CARDIAC HISTORY: "Do you have any history of heart disease?" (e.g., heart attack, angina, bypass surgery, angioplasty)       7. LUNG HISTORY: "Do you have any history of lung disease?"  (e.g.,  pulmonary embolus, asthma, emphysema)     no 8. CAUSE: "What do you think is causing the breathing problem?"      Received the flu vaccine 2 days ago. 9. OTHER SYMPTOMS: "Do you have any other symptoms? (e.g., dizziness, runny nose, cough, chest pain, fever)     Cough, chills 10. PREGNANCY: "Is there any chance you are pregnant?" "When was your last menstrual period?"       na 11. TRAVEL: "Have you traveled out of the country in the last month?" (e.g., travel history, exposures)       None  Protocols used: BREATHING DIFFICULTY-A-AH

## 2020-07-24 NOTE — Discharge Instructions (Addendum)
Take the antibiotic as prescribed and finish the full 7-day course.  You should use the albuterol inhaler, 2 puffs every 4-6 hours for the next several days to help with the wheezing.  Return to the ER for new, worsening, or persistent severe shortness of breath, fever, chest pain, weakness, or any other new or worsening symptoms that concern you.  Follow-up with your doctor in the next 1 to 2 weeks.

## 2020-07-24 NOTE — ED Provider Notes (Signed)
[facility] Emergency Department Provider Note ____________________________________________   First MD Initiated Contact with Patient 07/24/20 1330     (approximate)  I have reviewed the triage vital signs and the nursing notes.   HISTORY  Chief Complaint Weakness, Cough, and Headache    HPI Kristina Henson is a 64 y.o. female with PMH as noted below who presents with multiple symptoms starting acutely yesterday and persisting today.  She primarily describes nonproductive cough, shortness of breath, and wheezing, along with generalized weakness and malaise.  In addition the patient reports headache and body aches.  She states that she had a flu shot 2 days ago and then the symptoms started yesterday.  She denies any sick contacts or other recent illness.  She has no chest pain, palpitations, vomiting, or diarrhea.  She denies any urinary symptoms.  She denies any previous reactions like this to other vaccinations.  She was vaccinated for COVID-19 in March.  Past Medical History:  Diagnosis Date  . Anemia   . Arthritis    BACK-LUMBAR  . Atrial fibrillation ([place])   . Coronary artery disease   . Duodenitis   . Dysrhythmia   . Gastritis   . Hypertension   . Mass in neck    lt side  . Pneumonia 2016  . Stroke ([place]) 2012  . Stroke [facility]) 2012    Patient Active Problem List   Diagnosis Date Noted  . Acute cystitis without hematuria 01/02/2020  . 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 Advanced directives, counseling/discussion 10/16/2018  . Atherosclerosis of aorta ([place]) 04/03/2018  . H/O submandibular gland removal 09/08/2017  . Liver hemangioma 06/14/2016  . 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 Anemia due to chronic illness 04/30/2016  . Iron deficiency anemia due to chronic blood loss   . Benign neoplasm of sigmoid colon   . Benign hypertensive renal disease   . Atrial fibrillation ([place])   . HLD (hyperlipidemia) 07/11/2014  . History of CVA with residual  deficit 07/11/2014    Past Surgical History:  Procedure Laterality Date  . ABDOMINAL HYSTERECTOMY     Total  . cardiac catherization    . COLONOSCOPY WITH PROPOFOL N/A 11/18/2015   Procedure: COLONOSCOPY WITH PROPOFOL;  Surgeon: Lucilla Lame, MD;  Location: [facility] ENDOSCOPY;  Service: Endoscopy;  Laterality: N/A;  . CORONARY ANGIOPLASTY    . ESOPHAGOGASTRODUODENOSCOPY (EGD) WITH PROPOFOL N/A 04/23/2016   Procedure: ESOPHAGOGASTRODUODENOSCOPY (EGD) WITH PROPOFOL;  Surgeon: Lucilla Lame, MD;  Location: [place];  Service: Endoscopy;  Laterality: N/A;  small bowel bx gastric antrum bx  . SUBMANDIBULAR GLAND EXCISION Left 09/08/2017   Procedure: EXCISION SUBMANDIBULAR GLAND;  Surgeon: Carloyn Manner, MD;  Location: [facility] ORS;  Service: ENT;  Laterality: Left;  . throat biopsy      Prior to Admission medications   Medication Sig Start Date End Date Taking? Authorizing Provider  amiodarone (PACERONE) 200 MG tablet Take 1 tablet (200 mg total) by mouth every morning. 04/03/18   Johnson, Megan P, DO  amLODipine (NORVASC) 5 MG tablet Take 1 tablet (5 mg total) by mouth daily. 05/01/20   Kathrine Haddock, NP  Ascorbic Acid (VITAMIN C) 100 MG tablet Take 100 mg by mouth daily.    [provider]  aspirin EC 81 MG tablet Take 81 mg by mouth daily.     [provider]  azithromycin (ZITHROMAX Z-PAK) 250 MG tablet Take 2 tablets (500 mg) on  Day 1,  followed by 1 tablet (250 mg)  once daily on Days 2 through 5. 07/24/20 07/29/20  Arta Silence, MD  CRANBERRY PO Take by mouth.    [provider]  doxycycline (VIBRAMYCIN) 100 MG capsule Take 1 capsule (100 mg total) by mouth 2 (two) times daily for 7 days. 07/24/20 07/31/20  Arta Silence, MD  ferrous sulfate 325 (65 FE) MG EC tablet Take 1 tablet (325 mg total) by mouth 3 (three) times daily with meals. 11/02/19   Johnson, Megan P, DO  hydrALAZINE (APRESOLINE) 50 MG tablet Take 1 tablet (50 mg total) by mouth 2 (two)  times daily. 05/01/20   Kathrine Haddock, NP  lisinopril-hydrochlorothiazide (ZESTORETIC) 20-25 MG tablet Take 1 tablet by mouth 2 (two) times daily. 05/01/20   Kathrine Haddock, NP  Multiple Vitamins-Minerals (MULTIVITAMIN WITH MINERALS) tablet Take 1 tablet by mouth daily.    [provider]  valACYclovir (VALTREX) 1000 MG tablet Take 1 tablet (1,000 mg total) by mouth 2 (two) times daily. Patient not taking: Reported on 05/01/2020 12/19/19   [place] American, PA-C  VITAMIN D PO Take by mouth daily.    [provider]    Allergies Amoxicillin  Family History  Problem Relation Age of Onset  . Arthritis Mother   . Cancer Mother   . Hypertension Sister   . Asthma Brother   . Dementia Brother   . Breast cancer Neg Hx   . Kidney cancer Neg Hx   . Bladder Cancer Neg Hx     Social History Social History   Tobacco Use  . Smoking status: Former Smoker    Quit date: 1978    Years since quitting: 43.8  . Smokeless tobacco: Never Used  . Tobacco comment: didnt smoke much   Vaping Use  . Vaping Use: Never used  Substance Use Topics  . Alcohol use: Not Currently  . Drug use: No    Review of Systems  Constitutional: No fever.  Positive for weakness and malaise. Eyes: No redness. ENT: No sore throat. Cardiovascular: Denies chest pain. Respiratory: Positive for shortness of breath. Gastrointestinal: No vomiting or diarrhea.  Genitourinary: Negative for dysuria.  Musculoskeletal: Negative for back pain. Skin: Negative for rash. Neurological: Positive for headache.   ____________________________________________   PHYSICAL EXAM:  VITAL SIGNS: ED Triage Vitals  Enc Vitals Group     BP 07/24/20 1203 (!) 118/57     Pulse Rate 07/24/20 1203 63     Resp 07/24/20 1203 (!) 22     Temp 07/24/20 1203 99.5 F (37.5 C)     Temp Source 07/24/20 1203 Oral     SpO2 07/24/20 1203 95 %     Weight 07/24/20 1205 160 lb (72.6 kg)     Height 07/24/20 1205 5\' 2"  (1.575 m)       Head Circumference --      Peak Flow --      Pain Score 07/24/20 1205 0     Pain Loc --      Pain Edu? --      Excl. in [place]? --     Constitutional: Alert and oriented.  Relatively well appearing, in no acute distress. Eyes: Conjunctivae are normal.  EOMI.  PERRLA. Head: Atraumatic. Nose: No congestion/rhinnorhea. Mouth/Throat: Mucous membranes are moist.   Neck: Normal range of motion.  Cardiovascular: Normal rate, regular rhythm. Grossly normal heart sounds.  Good peripheral circulation. Respiratory: Slightly increased respiratory effort.  No retractions.  Scattered wheezing bilaterally. Gastrointestinal: Soft and nontender. No distention.  Genitourinary: No CVA  tenderness. Musculoskeletal: No lower extremity edema.  Extremities warm and well perfused.  Neurologic:  Normal speech and language. No gross focal neurologic deficits are appreciated.  Skin:  Skin is warm and dry. No rash noted. Psychiatric: Mood and affect are normal. Speech and behavior are normal.  ____________________________________________   LABS (all labs ordered are listed, but only abnormal results are displayed)  Labs Reviewed  BASIC METABOLIC PANEL - Abnormal; Notable for the following components:      Result Value   Glucose, Bld 135 (*)    All other components within normal limits  CBC - Abnormal; Notable for the following components:   Hemoglobin 10.9 (*)    HCT 33.1 (*)    Platelets 136 (*)    nRBC 0.3 (*)    All other components within normal limits  RESPIRATORY PANEL BY RT PCR (FLU A&B, COVID)  BRAIN NATRIURETIC PEPTIDE  URINALYSIS, COMPLETE (UACMP) WITH MICROSCOPIC  TROPONIN I (HIGH SENSITIVITY)   ____________________________________________  EKG  ED ECG REPORT I, Arta Silence, the attending physician, personally viewed and interpreted this ECG.  Date: 07/24/2020 EKG Time: 1207 Rate: 65 Rhythm: normal sinus rhythm QRS Axis: Left axis Intervals: LBBB pattern, prolonged  QTc ST/T Wave abnormalities: Nonspecific T wave abnormalities Narrative Interpretation: Nonspecific abnormalities with no evidence of acute ischemia; no significant change when compared to EKG of 10/21/2015  ____________________________________________  RADIOLOGY  Chest x-ray interpreted by me shows right retrohilar opacity with no other acute abnormalities CT chest: Right lower lobe consolidation consistent with pneumonia  ____________________________________________   PROCEDURES  Procedure(s) performed: No  Procedures  Critical Care performed: No ____________________________________________   INITIAL IMPRESSION / ASSESSMENT AND PLAN / ED COURSE  Pertinent labs & imaging results that were available during my care of the patient were reviewed by me and considered in my medical decision making (see chart for details).  XT 7-year-old female with PMH as noted above including a prior history of atrial fibrillation (currently in sinus and not on anticoagulation) who presents with multiple symptoms including cough, wheezing, malaise, body aches, and headache over the last 2 days after receiving a flu shot the day previously.  I reviewed the past medical records in [place].  The patient was last seen in the ED last year with cough and congestion and reassuring work-up.  She otherwise has no recent ED visits or admissions.  On exam, the patient is overall relatively well-appearing.  She does have slightly increased work of breathing but her O2 saturation is in the mid 90s on room air.  There is diffuse wheezing bilaterally.  Temperature is borderline elevated.  Neurologic exam is normal.  The exam is otherwise unremarkable.  Differential includes vaccine reaction/side effect (although I doubt this would cause wheezing or respiratory symptoms), acute bronchitis, pneumonia, COVID-19, flu, or less likely cardiac etiology.  Chest x-ray obtained from triage shows possible right retrohilar opacity  and radiology recommends obtaining a CT chest for further evaluation.  In addition to this we will obtain cardiac labs, give fluids, and bronchodilators.  ----------------------------------------- 3:48 PM on 07/24/2020 -----------------------------------------  Covid test is negative.  Troponin and BNP are both within normal limits.  The patient is feeling better after albuterol.  CT chest shows a right lower lobe pneumonia.  Given the patient's reassuring vital signs and lab work-up, she is appropriate for outpatient treatment for CAP.  I initially considered starting azithromycin, however given the patient's prolonged QT and the fact that she is on amiodarone, we will avoid azithromycin  and fluoroquinolones.  The patient has an apparent allergy to amoxicillin, so we will treat with doxycycline monotherapy.  At this time, the patient is stable for discharge home.  I counseled her on the results of the work-up.  We will start the doxycycline here and give the patient an inhaler to take home.  Return precautions given, and she expresses understanding.  ____________________________________________   FINAL CLINICAL IMPRESSION(S) / ED DIAGNOSES  Final diagnoses:  Community acquired pneumonia of right lung, unspecified part of lung      NEW MEDICATIONS STARTED DURING THIS VISIT:  New Prescriptions   AZITHROMYCIN (ZITHROMAX Z-PAK) 250 MG TABLET    Take 2 tablets (500 mg) on  Day 1,  followed by 1 tablet (250 mg) once daily on Days 2 through 5.   DOXYCYCLINE (VIBRAMYCIN) 100 MG CAPSULE    Take 1 capsule (100 mg total) by mouth 2 (two) times daily for 7 days.     Note:  This document was prepared using Dragon voice recognition software and may include unintentional dictation errors.    Arta Silence, MD 07/24/20 [phone]

## 2020-07-24 NOTE — ED Notes (Signed)
BIB [facility] from home c/o flu like sx.

## 2020-07-24 NOTE — ED Triage Notes (Addendum)
Arrived by EMS from home. Patient reports getting flu shot on Tuesday 07/22/2020 and starting Wednesday 07/23/2020 started having weakness, headaches, dry mouth, cough and wheezing. Denies pain right now. Patient keeps repeating "I just don't feel good." Vaccinated for covid March 2021

## 2020-07-30 ENCOUNTER — Other Ambulatory Visit: Payer: Self-pay

## 2020-07-30 ENCOUNTER — Encounter: Payer: Self-pay | Admitting: Family Medicine

## 2020-07-30 ENCOUNTER — Ambulatory Visit (INDEPENDENT_AMBULATORY_CARE_PROVIDER_SITE_OTHER): Payer: Medicare Other | Admitting: Family Medicine

## 2020-07-30 VITALS — BP 112/72 | HR 103 | Temp 98.0°F | Wt 163.6 lb

## 2020-07-30 DIAGNOSIS — G4700 Insomnia, unspecified: Secondary | ICD-10-CM | POA: Diagnosis not present

## 2020-07-30 DIAGNOSIS — J189 Pneumonia, unspecified organism: Secondary | ICD-10-CM | POA: Diagnosis not present

## 2020-07-30 MED ORDER — HYDROCOD POLST-CPM POLST ER 10-8 MG/5ML PO SUER: 5.0000 mL | Freq: Every evening | ORAL | 0 refills | Status: DC | PRN

## 2020-07-30 MED ORDER — TRAZODONE HCL 50 MG PO TABS
25.0000 mg | ORAL_TABLET | Freq: Every evening | ORAL | 2 refills | Status: DC | PRN
Start: 2020-07-30 — End: 2021-02-12

## 2020-07-30 NOTE — Assessment & Plan Note (Signed)
Will start PRN trazodone. Recheck 1 month. Call with any concerns.

## 2020-07-30 NOTE — Progress Notes (Signed)
BP 112/72   Pulse (!) 103   Temp 98 F (36.7 C)   Wt 163 lb 9.6 oz (74.2 kg)   SpO2 95%   BMI 29.92 kg/m    Subjective:    Patient ID: Kristina Henson, female    DOB: November 09, 1955, 64 y.o.   MRN: [id number]  HPI: NIAOMI Henson is a 64 y.o. female  Chief Complaint  Patient presents with  . Insomnia    pt states about 3-4 nights out of the week she can not fall asleep, states it has been happening for almost 2 months. pt states she has tried otc melatonin but doesn't feel like it helps  . Pneumonia    pt was seen in the hospital on 07/24/20 was diagnosed with pneumonia, states she started with a cough on 07/23/20 along with cold chills, and wheezing   ER FOLLOW UP Time since discharge: 6 days  Hospital/facility: [facility] Diagnosis: CAP, RLL Procedures/tests: CXR, labs Consultants: None New medications: doxycycline Discharge instructions:  Follow up here Status: better   no fevers, no chills, continues to cough. Not bringing anything up. No SOB  Has been coughing that seems to be keeping her awake for the past week.   INSOMNIA- not on any caffiene, goes to bed around 11PM, sometimes doesn't sleep at night, sometimes will dose off, getting about 3-4 hours a night Duration: 1-2 months Satisfied with sleep quality: no Difficulty falling asleep: yes Difficulty staying asleep: no Waking a few hours after sleep onset: no Early morning awakenings: no Daytime hypersomnolence: no Wakes feeling refreshed: no Good sleep hygiene: no Apnea: no Snoring: no Depressed/anxious mood: yes Recent stress: no Restless legs/nocturnal leg cramps: no Chronic pain/arthritis: no History of sleep study: no Treatments attempted: melatonin    Relevant past medical, surgical, family and social history reviewed and updated as indicated. Interim medical history since our last visit reviewed. Allergies and medications reviewed and updated.  Review of Systems  Constitutional: Positive for  fatigue. Negative for activity change, appetite change, chills, diaphoresis, fever and unexpected weight change.  HENT: Negative.   Respiratory: Positive for cough. Negative for apnea, choking, chest tightness, shortness of breath, wheezing and stridor.   Cardiovascular: Negative.   Gastrointestinal: Negative.   Musculoskeletal: Negative.   Psychiatric/Behavioral: Positive for sleep disturbance. Negative for agitation, behavioral problems, confusion, decreased concentration, dysphoric mood, hallucinations, self-injury and suicidal ideas. The patient is not nervous/anxious and is not hyperactive.     Per HPI unless specifically indicated above     Objective:    BP 112/72   Pulse (!) 103   Temp 98 F (36.7 C)   Wt 163 lb 9.6 oz (74.2 kg)   SpO2 95%   BMI 29.92 kg/m   Wt Readings from Last 3 Encounters:  07/30/20 163 lb 9.6 oz (74.2 kg)  07/24/20 160 lb (72.6 kg)  05/01/20 165 lb 12.8 oz (75.2 kg)    Physical Exam Vitals and nursing note reviewed.  Constitutional:      General: She is not in acute distress.    Appearance: Normal appearance. She is not ill-appearing, toxic-appearing or diaphoretic.  HENT:     Head: Normocephalic and atraumatic.     Right Ear: External ear normal.     Left Ear: External ear normal.     Nose: Nose normal.     Mouth/Throat:     Mouth: Mucous membranes are moist.     Pharynx: Oropharynx is clear.  Eyes:     General:  No scleral icterus.       Right eye: No discharge.        Left eye: No discharge.     Extraocular Movements: Extraocular movements intact.     Conjunctiva/sclera: Conjunctivae normal.     Pupils: Pupils are equal, round, and reactive to light.  Cardiovascular:     Rate and Rhythm: Normal rate and regular rhythm.     Pulses: Normal pulses.     Heart sounds: Normal heart sounds. No murmur heard.  No friction rub. No gallop.   Pulmonary:     Effort: Pulmonary effort is normal. No respiratory distress.     Breath sounds: Normal  breath sounds. No stridor. No wheezing, rhonchi or rales.  Chest:     Chest wall: No tenderness.  Musculoskeletal:        General: Normal range of motion.     Cervical back: Normal range of motion and neck supple.  Skin:    General: Skin is warm and dry.     Capillary Refill: Capillary refill takes less than 2 seconds.     Coloration: Skin is not jaundiced or pale.     Findings: No bruising, erythema, lesion or rash.  Neurological:     General: No focal deficit present.     Mental Status: She is alert and oriented to person, place, and time. Mental status is at baseline.  Psychiatric:        Mood and Affect: Mood normal.        Behavior: Behavior normal.        Thought Content: Thought content normal.        Judgment: Judgment normal.     Results for orders placed or performed during the hospital encounter of 07/24/20  Respiratory Panel by RT PCR (Flu A&B, Covid) - Nasopharyngeal Swab   Specimen: Nasopharyngeal Swab  Result Value Ref Range   SARS Coronavirus 2 by RT PCR NEGATIVE NEGATIVE   Influenza A by PCR NEGATIVE NEGATIVE   Influenza B by PCR NEGATIVE NEGATIVE  Basic metabolic panel  Result Value Ref Range   Sodium 136 135 - 145 mmol/L   Potassium 3.5 3.5 - 5.1 mmol/L   Chloride 101 98 - 111 mmol/L   CO2 25 22 - 32 mmol/L   Glucose, Bld 135 (H) 70 - 99 mg/dL   BUN 23 8 - 23 mg/dL   Creatinine, Ser 0.86 0.44 - 1.00 mg/dL   Calcium 9.1 8.9 - 10.3 mg/dL   GFR, Estimated >60 >60 mL/min   Anion gap 10 5 - 15  CBC  Result Value Ref Range   WBC 6.8 4.0 - 10.5 K/uL   RBC 3.99 3.87 - 5.11 MIL/uL   Hemoglobin 10.9 (L) 12.0 - 15.0 g/dL   HCT 33.1 (L) 36 - 46 %   MCV 83.0 80.0 - 100.0 fL   MCH 27.3 26.0 - 34.0 pg   MCHC 32.9 30.0 - 36.0 g/dL   RDW 15.1 11.5 - 15.5 %   Platelets 136 (L) 150 - 400 K/uL   nRBC 0.3 (H) 0.0 - 0.2 %  Brain natriuretic peptide  Result Value Ref Range   B Natriuretic Peptide 78.3 0.0 - 100.0 pg/mL  Troponin I (High Sensitivity)  Result Value  Ref Range   Troponin I (High Sensitivity) 8 <18 ng/L      Assessment & Plan:   Problem List Items Addressed This Visit      Other   Insomnia    Will start  PRN trazodone. Recheck 1 month. Call with any concerns.        Other Visit Diagnoses    Pneumonia of right lower lobe due to infectious organism    -  Primary   Lungs clear. Continue doxycycline. Tussionex for the next couple of days. Call with any concerns. Continue to monitor.    Relevant Medications   chlorpheniramine-HYDROcodone (TUSSIONEX PENNKINETIC ER) 10-8 MG/5ML SUER       Follow up plan: Return in about 4 weeks (around 08/27/2020).   >25 minutes spent with patient today

## 2020-08-08 ENCOUNTER — Encounter: Payer: Self-pay | Admitting: Emergency Medicine

## 2020-08-08 ENCOUNTER — Ambulatory Visit: Payer: Self-pay | Admitting: Family Medicine

## 2020-08-08 ENCOUNTER — Emergency Department: Payer: Medicare Other

## 2020-08-08 ENCOUNTER — Other Ambulatory Visit: Payer: Self-pay

## 2020-08-08 ENCOUNTER — Emergency Department
Admission: EM | Admit: 2020-08-08 | Discharge: 2020-08-08 | Disposition: A | Discharge status: Home / Self Care | Payer: Medicare Other | Attending: Emergency Medicine | Admitting: Emergency Medicine

## 2020-08-08 DIAGNOSIS — J9811 Atelectasis: Secondary | ICD-10-CM | POA: Diagnosis not present

## 2020-08-08 DIAGNOSIS — J181 Lobar pneumonia, unspecified organism: Secondary | ICD-10-CM | POA: Diagnosis not present

## 2020-08-08 DIAGNOSIS — I2510 Atherosclerotic heart disease of native coronary artery without angina pectoris: Secondary | ICD-10-CM | POA: Diagnosis not present

## 2020-08-08 DIAGNOSIS — Z87891 Personal history of nicotine dependence: Secondary | ICD-10-CM | POA: Diagnosis not present

## 2020-08-08 DIAGNOSIS — Z86010 Personal history of colonic polyps: Secondary | ICD-10-CM | POA: Insufficient documentation

## 2020-08-08 DIAGNOSIS — R0602 Shortness of breath: Secondary | ICD-10-CM | POA: Diagnosis not present

## 2020-08-08 DIAGNOSIS — Z7982 Long term (current) use of aspirin: Secondary | ICD-10-CM | POA: Diagnosis not present

## 2020-08-08 DIAGNOSIS — I129 Hypertensive chronic kidney disease with stage 1 through stage 4 chronic kidney disease, or unspecified chronic kidney disease: Secondary | ICD-10-CM | POA: Diagnosis not present

## 2020-08-08 DIAGNOSIS — R079 Chest pain, unspecified: Secondary | ICD-10-CM | POA: Diagnosis not present

## 2020-08-08 DIAGNOSIS — R0789 Other chest pain: Secondary | ICD-10-CM | POA: Diagnosis not present

## 2020-08-08 DIAGNOSIS — Z79899 Other long term (current) drug therapy: Secondary | ICD-10-CM | POA: Diagnosis not present

## 2020-08-08 DIAGNOSIS — J189 Pneumonia, unspecified organism: Secondary | ICD-10-CM

## 2020-08-08 DIAGNOSIS — N189 Chronic kidney disease, unspecified: Secondary | ICD-10-CM | POA: Insufficient documentation

## 2020-08-08 DIAGNOSIS — Z20822 Contact with and (suspected) exposure to covid-19: Secondary | ICD-10-CM | POA: Insufficient documentation

## 2020-08-08 DIAGNOSIS — I4891 Unspecified atrial fibrillation: Secondary | ICD-10-CM | POA: Diagnosis not present

## 2020-08-08 DIAGNOSIS — R059 Cough, unspecified: Secondary | ICD-10-CM | POA: Diagnosis not present

## 2020-08-08 DIAGNOSIS — Z955 Presence of coronary angioplasty implant and graft: Secondary | ICD-10-CM | POA: Diagnosis not present

## 2020-08-08 DIAGNOSIS — J90 Pleural effusion, not elsewhere classified: Secondary | ICD-10-CM | POA: Diagnosis not present

## 2020-08-08 DIAGNOSIS — I959 Hypotension, unspecified: Secondary | ICD-10-CM | POA: Diagnosis not present

## 2020-08-08 DIAGNOSIS — R0781 Pleurodynia: Secondary | ICD-10-CM | POA: Diagnosis present

## 2020-08-08 DIAGNOSIS — I700 Atherosclerosis of aorta: Secondary | ICD-10-CM | POA: Insufficient documentation

## 2020-08-08 DIAGNOSIS — I517 Cardiomegaly: Secondary | ICD-10-CM | POA: Diagnosis not present

## 2020-08-08 LAB — RESPIRATORY PANEL BY RT PCR (FLU A&B, COVID)
Influenza A by PCR: NEGATIVE
Influenza B by PCR: NEGATIVE
SARS Coronavirus 2 by RT PCR: NEGATIVE

## 2020-08-08 LAB — CBC
HCT: 35.9 % — ABNORMAL LOW (ref 36.0–46.0)
Hemoglobin: 11.2 g/dL — ABNORMAL LOW (ref 12.0–15.0)
MCH: 26.4 pg (ref 26.0–34.0)
MCHC: 31.2 g/dL (ref 30.0–36.0)
MCV: 84.7 fL (ref 80.0–100.0)
Platelets: 248 10*3/uL (ref 150–400)
RBC: 4.24 MIL/uL (ref 3.87–5.11)
RDW: 15.5 % (ref 11.5–15.5)
WBC: 5.8 10*3/uL (ref 4.0–10.5)
nRBC: 0 % (ref 0.0–0.2)

## 2020-08-08 LAB — BASIC METABOLIC PANEL
Anion gap: 13 (ref 5–15)
BUN: 32 mg/dL — ABNORMAL HIGH (ref 8–23)
CO2: 24 mmol/L (ref 22–32)
Calcium: 9.2 mg/dL (ref 8.9–10.3)
Chloride: 100 mmol/L (ref 98–111)
Creatinine, Ser: 0.94 mg/dL (ref 0.44–1.00)
GFR, Estimated: >60 mL/min (ref >60)
Glucose, Bld: 110 mg/dL — ABNORMAL HIGH (ref 70–99)
Potassium: 4.2 mmol/L (ref 3.5–5.1)
Sodium: 137 mmol/L (ref 135–145)

## 2020-08-08 LAB — TROPONIN I (HIGH SENSITIVITY)
Troponin I (High Sensitivity): 4 ng/L (ref <18)
Troponin I (High Sensitivity): 4 ng/L (ref <18)

## 2020-08-08 LAB — PROCALCITONIN: Procalcitonin: <0.1 ng/mL

## 2020-08-08 LAB — FIBRIN DERIVATIVES D-DIMER (ARMC ONLY): Fibrin derivatives D-dimer (ARMC): 2469.28 ng/mL (FEU) — ABNORMAL HIGH (ref 0.00–499.00)

## 2020-08-08 LAB — BRAIN NATRIURETIC PEPTIDE: B Natriuretic Peptide: 45.6 pg/mL (ref 0.0–100.0)

## 2020-08-08 LAB — MAGNESIUM: Magnesium: 2.1 mg/dL (ref 1.7–2.4)

## 2020-08-08 MED ORDER — LEVOFLOXACIN 750 MG PO TABS
750.0000 mg | ORAL_TABLET | Freq: Every day | ORAL | 0 refills | Status: AC
Start: 2020-08-08 — End: 2020-08-15

## 2020-08-08 MED ORDER — LEVOFLOXACIN IN D5W 750 MG/150ML IV SOLN
750.0000 mg | Freq: Once | INTRAVENOUS | Status: AC
  Administered 2020-08-08: 750 mg via INTRAVENOUS
  Filled 2020-08-08: qty 150

## 2020-08-08 MED ORDER — IOHEXOL 350 MG/ML SOLN
75.0000 mL | Freq: Once | INTRAVENOUS | Status: AC | PRN
  Administered 2020-08-08: 75 mL via INTRAVENOUS

## 2020-08-08 NOTE — ED Notes (Signed)
Pt back from CT. CT tech made this RN aware IV was working great for IV contrast but states site now looks swollen and bruised. This RN assessed. IV infiltrated and removed at this time. Unable to obtain another access. IV team consult placed.

## 2020-08-08 NOTE — Telephone Encounter (Signed)
Agree with ER.

## 2020-08-08 NOTE — ED Notes (Signed)
Pt transported to CT ?

## 2020-08-08 NOTE — ED Triage Notes (Signed)
First Rn Note: pt to ED via POV with c/o L lower CP x several days. Per EMS tenderness with palpation, currently recovering from pneumonia. Per EMS pt with hxof A-fib at this time.   324 ASA given en route 133/80

## 2020-08-08 NOTE — ED Notes (Signed)
Lab called to come draw pts repeat troponin due to pt being difficult stick.

## 2020-08-08 NOTE — ED Notes (Signed)
Lab called to come draw blood on patient.

## 2020-08-08 NOTE — ED Triage Notes (Signed)
See this RN first RN Note, pt states initially pain started under L breast, then pain moved over L breast. Pt states pain is intermittent throughout the day. Pt states just finished course of abx for pneumonia.

## 2020-08-08 NOTE — ED Provider Notes (Signed)
[facility] Emergency Department Provider Note  ____________________________________________   First MD Initiated Contact with Patient 08/08/20 1623     (approximate)  I have reviewed the triage vital signs and the nursing notes.   HISTORY  Chief Complaint Chest Pain   HPI Kristina Henson is a 64 y.o. female with a past medical history of iron deficiency anemia, CAD, A. fib rhythm controlled on amiodarone, CVA, HTN, gastritis, and arthritis as well as recent diagnosis of [facility] pneumonia on 10/28 discharged with a prescription for azithromycin and doxycycline who presents for assessment of 3 to 4 days of left-sided intermittent pleuritic chest pain.  Patient states she still has some cough and shortness of breath, recently but feels these have improved over the last couple of days.  She states her left-sided chest pain is only intermittent and is not exertional or positional.  It does not radiate.  She is never had pain in this location before.  She denies any rashes or recent trauma.  She denies any headache, earache, sore throat, fevers, vomiting, diarrhea, dysuria, back pain, abdominal pain, urinary symptoms, or other acute complaints.  Denies tobacco abuse or EtOH abuse.  No other acute concerns at this time.         Past Medical History:  Diagnosis Date  . Anemia   . Arthritis    BACK-LUMBAR  . Atrial fibrillation ([place])   . Coronary artery disease   . Duodenitis   . Dysrhythmia   . Gastritis   . Hypertension   . Mass in neck    lt side  . Pneumonia 2016  . Stroke ([place]) 2012  . Stroke [facility]) 2012    Patient Active Problem List   Diagnosis Date Noted  . Insomnia 07/30/2020  . Acute cystitis without hematuria 01/02/2020  . 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 Advanced directives, counseling/discussion 10/16/2018  . Atherosclerosis of aorta ([place]) 04/03/2018  . H/O submandibular gland removal 09/08/2017  . Liver hemangioma 06/14/2016  .  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 Anemia due to chronic illness 04/30/2016  . Iron deficiency anemia due to chronic blood loss   . Benign neoplasm of sigmoid colon   . Benign hypertensive renal disease   . Atrial fibrillation ([place])   . HLD (hyperlipidemia) 07/11/2014  . History of CVA with residual deficit 07/11/2014    Past Surgical History:  Procedure Laterality Date  . ABDOMINAL HYSTERECTOMY     Total  . cardiac catherization    . COLONOSCOPY WITH PROPOFOL N/A 11/18/2015   Procedure: COLONOSCOPY WITH PROPOFOL;  Surgeon: Lucilla Lame, MD;  Location: [facility] ENDOSCOPY;  Service: Endoscopy;  Laterality: N/A;  . CORONARY ANGIOPLASTY    . ESOPHAGOGASTRODUODENOSCOPY (EGD) WITH PROPOFOL N/A 04/23/2016   Procedure: ESOPHAGOGASTRODUODENOSCOPY (EGD) WITH PROPOFOL;  Surgeon: Lucilla Lame, MD;  Location: [place];  Service: Endoscopy;  Laterality: N/A;  small bowel bx gastric antrum bx  . SUBMANDIBULAR GLAND EXCISION Left 09/08/2017   Procedure: EXCISION SUBMANDIBULAR GLAND;  Surgeon: Carloyn Manner, MD;  Location: [facility] ORS;  Service: ENT;  Laterality: Left;  . throat biopsy      Prior to Admission medications   Medication Sig Start Date End Date Taking? Authorizing Provider  amiodarone (PACERONE) 200 MG tablet Take 1 tablet (200 mg total) by mouth every morning. 04/03/18   Johnson, Megan P, DO  amLODipine (NORVASC) 5 MG tablet Take 1 tablet (5 mg total) by mouth daily. 05/01/20   Kathrine Haddock, NP  Ascorbic Acid (VITAMIN C)  100 MG tablet Take 100 mg by mouth daily.    [provider]  aspirin EC 81 MG tablet Take 81 mg by mouth daily.     [provider]  chlorpheniramine-HYDROcodone (TUSSIONEX PENNKINETIC ER) 10-8 MG/5ML SUER Take 5 mLs by mouth at bedtime as needed. 07/30/20   Johnson, Megan P, DO  CRANBERRY PO Take by mouth.    [provider]  ferrous sulfate 325 (65 FE) MG EC tablet Take 1 tablet (325 mg total) by mouth 3 (three) times daily with meals.  11/02/19   Johnson, Megan P, DO  hydrALAZINE (APRESOLINE) 50 MG tablet Take 1 tablet (50 mg total) by mouth 2 (two) times daily. 05/01/20   Kathrine Haddock, NP  levofloxacin (LEVAQUIN) 750 MG tablet Take 1 tablet (750 mg total) by mouth daily for 7 days. 08/08/20 08/15/20  Lucrezia Starch, MD  lisinopril-hydrochlorothiazide (ZESTORETIC) 20-25 MG tablet Take 1 tablet by mouth 2 (two) times daily. 05/01/20   Kathrine Haddock, NP  Multiple Vitamins-Minerals (MULTIVITAMIN WITH MINERALS) tablet Take 1 tablet by mouth daily.    [provider]  traZODone (DESYREL) 50 MG tablet Take 0.5-1 tablets (25-50 mg total) by mouth at bedtime as needed for sleep. 07/30/20   Johnson, Megan P, DO  valACYclovir (VALTREX) 1000 MG tablet Take 1 tablet (1,000 mg total) by mouth 2 (two) times daily. Patient not taking: Reported on 05/01/2020 12/19/19   Volney American, PA-C  VITAMIN D PO Take by mouth daily.    [provider]    Allergies Amoxicillin  Family History  Problem Relation Age of Onset  . Arthritis Mother   . Cancer Mother   . Hypertension Sister   . Asthma Brother   . Dementia Brother   . Breast cancer Neg Hx   . Kidney cancer Neg Hx   . Bladder Cancer Neg Hx     Social History Social History   Tobacco Use  . Smoking status: Former Smoker    Quit date: 1978    Years since quitting: 43.8  . Smokeless tobacco: Never Used  . Tobacco comment: didnt smoke much   Vaping Use  . Vaping Use: Never used  Substance Use Topics  . Alcohol use: Not Currently  . Drug use: No    Review of Systems  Review of Systems  Constitutional: Negative for chills and fever.  HENT: Negative for sore throat.   Eyes: Negative for pain.  Respiratory: Positive for cough. Negative for stridor.   Cardiovascular: Positive for chest pain.  Gastrointestinal: Negative for vomiting.  Skin: Negative for rash.  Neurological: Negative for seizures, loss of consciousness and headaches.    Psychiatric/Behavioral: Negative for suicidal ideas.  All other systems reviewed and are negative.     ____________________________________________   PHYSICAL EXAM:  VITAL SIGNS: ED Triage Vitals  Enc Vitals Group     BP 08/08/20 1315 120/60     Pulse Rate 08/08/20 1315 63     Resp 08/08/20 1315 20     Temp 08/08/20 1315 99.1 F (37.3 C)     Temp Source 08/08/20 1315 Oral     SpO2 08/08/20 1315 97 %     Weight 08/08/20 1313 150 lb (68 kg)     Height 08/08/20 1313 5\' 2"  (1.575 m)     Head Circumference --      Peak Flow --      Pain Score 08/08/20 1313 10     Pain Loc --  Pain Edu? --      Excl. in [place]? --    Vitals:   08/08/20 1730 08/08/20 1800  BP: (!) 129/58 117/62  Pulse: 63 64  Resp: 16 16  Temp:    SpO2: 100% 99%   Physical Exam Vitals and nursing note reviewed.  Constitutional:      General: She is not in acute distress.    Appearance: She is well-developed.  HENT:     Head: Normocephalic and atraumatic.     Right Ear: External ear normal.     Left Ear: External ear normal.     Nose: Nose normal.  Eyes:     Conjunctiva/sclera: Conjunctivae normal.  Cardiovascular:     Rate and Rhythm: Normal rate and regular rhythm.     Heart sounds: No murmur heard.   Pulmonary:     Effort: Pulmonary effort is normal. No respiratory distress.     Breath sounds: Examination of the left-lower field reveals rhonchi. Rhonchi present.  Abdominal:     Palpations: Abdomen is soft.     Tenderness: There is no abdominal tenderness.  Musculoskeletal:     Cervical back: Neck supple.     Right lower leg: No edema.     Left lower leg: No edema.  Skin:    General: Skin is warm and dry.  Neurological:     Mental Status: She is alert and oriented to person, place, and time.  Psychiatric:        Mood and Affect: Mood normal.      ____________________________________________   LABS (all labs ordered are listed, but only abnormal results are displayed)  Labs  Reviewed  BASIC METABOLIC PANEL - Abnormal; Notable for the following components:      Result Value   Glucose, Bld 110 (*)    BUN 32 (*)    All other components within normal limits  CBC - Abnormal; Notable for the following components:   Hemoglobin 11.2 (*)    HCT 35.9 (*)    All other components within normal limits  FIBRIN DERIVATIVES D-DIMER ([facility] ONLY) - Abnormal; Notable for the following components:   Fibrin derivatives D-dimer [facility]) [phone] (*)    All other components within normal limits  RESPIRATORY PANEL BY RT PCR (FLU A&B, COVID)  BRAIN NATRIURETIC PEPTIDE  PROCALCITONIN  MAGNESIUM  TROPONIN I (HIGH SENSITIVITY)  TROPONIN I (HIGH SENSITIVITY)   ____________________________________________  EKG  Sinus rhythm with a ventricular rate of 65, normal axis, unremarkable intervals, T wave inversions in lead III and aVF as well as anterior leads V2, V3, V4 and V5.  No other clear evidence of acute ischemia or significant arrhythmia. ____________________________________________  RADIOLOGY  ED MD interpretation: Left lateral focal consolidation and some opacity in the right perihilar region without overt edema, large effusion, pneumothorax, or other clear acute thoracic process.  Official radiology report(s): DG Chest 2 View  Result Date: 08/08/2020 CLINICAL DATA:  Shortness of breath and cough EXAM: CHEST - 2 VIEW COMPARISON:  July 24, 2020 chest radiograph and chest CT FINDINGS: There is new airspace opacity in the lateral left base with questionable small left pleural effusion. There has been significant clearing of airspace consolidation from the right lower lobe. Mild infiltrate in the perihilar region on the right persists. There is stable cardiac prominence with pulmonary vascularity normal. No adenopathy. There is aortic atherosclerosis. There is degenerative change in the thoracic spine. IMPRESSION: New airspace opacity in the lateral left base with equivocal left  pleural  effusion. Significant clearing of infiltrate from the right lower lobe with persistent ill-defined opacity right perihilar region. Stable cardiac prominence. Aortic Atherosclerosis (ICD10-I70.0). Electronically Signed   By: Lowella Grip III M.D.   On: 08/08/2020 14:00   CT Angio Chest PE W and/or Wo Contrast  Result Date: 08/08/2020 CLINICAL DATA:  Intermittent left breast pain, recent pneumonia EXAM: CT ANGIOGRAPHY CHEST WITH CONTRAST TECHNIQUE: Multidetector CT imaging of the chest was performed using the standard protocol during bolus administration of intravenous contrast. Multiplanar CT image reconstructions and MIPs were obtained to evaluate the vascular anatomy. CONTRAST:  18mL OMNIPAQUE IOHEXOL 350 MG/ML SOLN COMPARISON:  08/08/2020, 07/24/2020 FINDINGS: Cardiovascular: This is a technically adequate evaluation of the pulmonary vasculature. No filling defects or pulmonary emboli. Dilated main pulmonary artery unchanged consistent with pulmonary arterial hypertension. Mild cardiomegaly with trace pericardial effusion. Normal caliber of the thoracic aorta. Mild atherosclerosis. Mediastinum/Nodes: No enlarged mediastinal, hilar, or axillary lymph nodes. Thyroid gland, trachea, and esophagus demonstrate no significant findings. Lungs/Pleura: Persistent but improving dense consolidation within the superior segment of the right lower lobe. Findings are consistent with residual pneumonia. Please note that it may take 6-8 weeks after completion of antibiotic therapy for complete radiographic resolution. Patchy consolidation at the left lung base has the appearance of atelectasis rather than acute airspace disease. Trace bilateral pleural effusions. No pneumothorax. Central airways are patent. Upper Abdomen: No acute abnormality. Musculoskeletal: No acute or destructive bony lesions. Reconstructed images demonstrate no additional findings. Review of the MIP images confirms the above findings.  IMPRESSION: 1. No evidence of pulmonary embolus. 2. Persistent but improving superior segment right lower lobe airspace disease, consistent with pneumonia. Followup PA and lateral chest X-ray is recommended in 3-4 weeks following trial of antibiotic therapy to ensure resolution and exclude underlying malignancy. 3. Trace bilateral pleural effusions. Patchy atelectasis left lower lobe. 4. Mild cardiomegaly, with trace pericardial effusion new since prior study. 5. Stable pulmonary arterial hypertension. 6.  Aortic Atherosclerosis (ICD10-I70.0). Electronically Signed   By: Randa Ngo M.D.   On: 08/08/2020 18:51    ____________________________________________   PROCEDURES  Procedure(s) performed (including Critical Care):  Procedures   ____________________________________________   INITIAL IMPRESSION / ASSESSMENT AND PLAN / ED COURSE        Patient presents above to history exam for assessment of some intermittent pleuritic left-sided chest pain over the last couple of days in the setting of recent diagnosis of [facility] pneumonia.  On arrival patient is afebrile hemodynamically stable.  Exam as above.  Differential includes but is not limited to PE, persistent pneumonia, pleurisy, ACS, arrhythmia, acute anemia, metabolic derangements, pneumothorax, myocarditis, pericarditis.  No rash or skin changes to suggest cellulitis.  CT obtained due to elevated dimer shows no evidence of PE but did show left lower lobe pneumonia seen on chest x-ray.  No evidence of significant volume overload or other acute thoracic process.  Pneumonia does appear significantly improving all patient states she does not currently take any other antibiotics.  She does have some trace bilateral pleural effusions very mild cardiomegaly and otherwise stable or pulmonary hypertension and atherosclerosis.  Given she is nontoxic and imaging shows improving pneumonia I am hesitant to say she failed her initial outpatient  antibiotic therapy likely requires additional course.  She has no evidence of hypoxic respiratory failure sepsis or systemic illness.  We will switch her to levofloxacin.  He had work-up otherwise not consistent with ACS or other new left lung process.  No significant ocular metabolic  derangements.  Patient discharged stable condition.  Strict precautions advised discussed.   ____________________________________________   FINAL CLINICAL IMPRESSION(S) / ED DIAGNOSES  Final diagnoses:  Pneumonia of left lower lobe due to infectious organism    Medications  levofloxacin (LEVAQUIN) IVPB 750 mg (750 mg Intravenous New Bag/Given 08/08/20 1711)  iohexol (OMNIPAQUE) 350 MG/ML injection 75 mL (75 mLs Intravenous Contrast Given 08/08/20 1823)     ED Discharge Orders         Ordered    levofloxacin (LEVAQUIN) 750 MG tablet  Daily        08/08/20 1900           Note:  This document was prepared using Dragon voice recognition software and may include unintentional dictation errors.   Lucrezia Starch, MD 08/08/20 1902

## 2020-08-08 NOTE — Telephone Encounter (Signed)
Phone call to pt.  Reported she has had left breast pain for the past few days.  Stated yesterday the pain was "serious", and she was in so much pain that she cried.  Today the pain is underneath the left breast.  Pain comes and goes and lasts for short periods.  Describes as "moderate" pain today.  Reported recent hx of pneumonia, and questioned if the pain could be related to a pulled muscle from coughing.  Denied shortness of breath, sweating, nausea or vomiting.  Denied pain with taking a deep breath.  Stated that 2 days ago, she had pain in the left neck; stated the neck pain did not occur at same time as the chest pain.    Call placed to [facility].  No available appts. Today.  Will send Triage note to office for Dr. Wynetta Emery to review.  Advised pt. To go to the ER today; emphasized need to rule out cardiac cause for her chest pain.  Pt. Verb. Understanding.  Agreed to make arrangements for someone to drive her.      Reason for Disposition . [1] Chest pain lasts > 5 minutes AND [2] occurred in past 3 days (72 hours) (Exception: feels exactly the same as previously diagnosed heartburn and has accompanying sour taste in mouth)  Answer Assessment - Initial Assessment Questions 1. LOCATION: "Where does it hurt?"       Beneath the left breast 2. RADIATION: "Does the pain go anywhere else?" (e.g., into neck, jaw, arms, back)     Intermittent pain in the left neck (occurred 2 days ago) 3. ONSET: "When did the chest pain begin?" (Minutes, hours or days)      A few days ago 4. PATTERN "Does the pain come and go, or has it been constant since it started?"  "Does it get worse with exertion?"      Comes and goes 5. DURATION: "How long does it last" (e.g., seconds, minutes, hours)     Lasts short periods 6. SEVERITY: "How bad is the pain?"  (e.g., Scale 1-10; mild, moderate, or severe)    - MILD (1-3): doesn't interfere with normal activities     - MODERATE (4-7): interferes with normal activities or awakens  from sleep    - SEVERE (8-10): excruciating pain, unable to do any normal activities       "moderate"  7. CARDIAC RISK FACTORS: "Do you have any history of heart problems or risk factors for heart disease?" (e.g., angina, prior heart attack; diabetes, high blood pressure, high cholesterol, smoker, or strong family history of heart disease)     Denied heart attack; hx of HTN, non-smoker, denied family hx 8. PULMONARY RISK FACTORS: "Do you have any history of lung disease?"  (e.g., blood clots in lung, asthma, emphysema, birth control pills)     Denied chronic lung hx. Or blood clot hx 9. CAUSE: "What do you think is causing the chest pain?"     Reported she has done a lot of coughing with recent pneumonia 10. OTHER SYMPTOMS: "Do you have any other symptoms?" (e.g., dizziness, nausea, vomiting, sweating, fever, difficulty breathing, cough)       Stated some shortness of breath as she has been recovering from pneumonia;  Denied sweating, nausea/ vomiting; describes a "serious pain" at times in left breast area; today the pain is less than yesterday.  11. PREGNANCY: "Is there any chance you are pregnant?" "When was your last menstrual period?"       n/a  Protocols used: CHEST PAIN-A-AH  Message from Beverley Fiedler sent at 08/08/2020 10:50 AM EST  Summary: Clinical Advice   Patient would like to speak with nurse about pulled muscle under breast. Please advise

## 2020-08-11 ENCOUNTER — Ambulatory Visit (INDEPENDENT_AMBULATORY_CARE_PROVIDER_SITE_OTHER): Payer: Medicare Other | Admitting: Nurse Practitioner

## 2020-08-11 ENCOUNTER — Other Ambulatory Visit: Payer: Self-pay

## 2020-08-11 ENCOUNTER — Encounter: Payer: Self-pay | Admitting: Nurse Practitioner

## 2020-08-11 VITALS — BP 116/72 | HR 62 | Temp 97.8°F | Wt 162.8 lb

## 2020-08-11 DIAGNOSIS — R8281 Pyuria: Secondary | ICD-10-CM

## 2020-08-11 DIAGNOSIS — R350 Frequency of micturition: Secondary | ICD-10-CM | POA: Diagnosis not present

## 2020-08-11 LAB — WET PREP FOR TRICH, YEAST, CLUE
Clue Cell Exam: POSITIVE — AB
Trichomonas Exam: NEGATIVE
Yeast Exam: NEGATIVE

## 2020-08-11 LAB — MICROSCOPIC EXAMINATION: Bacteria, UA: NONE SEEN

## 2020-08-11 LAB — URINALYSIS, ROUTINE W REFLEX MICROSCOPIC
Bilirubin, UA: NEGATIVE
Glucose, UA: NEGATIVE
Ketones, UA: NEGATIVE
Nitrite, UA: NEGATIVE
Protein,UA: NEGATIVE
RBC, UA: NEGATIVE
Specific Gravity, UA: 1.02 (ref 1.005–1.030)
Urobilinogen, Ur: 0.2 mg/dL (ref 0.2–1.0)
pH, UA: 5 (ref 5.0–7.5)

## 2020-08-11 MED ORDER — METRONIDAZOLE 500 MG PO TABS: 500.0000 mg | ORAL_TABLET | Freq: Two times a day (BID) | ORAL | 0 refills | Status: AC

## 2020-08-11 NOTE — Patient Instructions (Signed)

## 2020-08-11 NOTE — Assessment & Plan Note (Signed)
Acute x one week with current abx use.  UA 2+ LEUK and 6-10 WBC, not other abnormal findings.  Wet prep performed and noted + clue cells, negative trich and yeast.  Will send in script for Flagyl.  Educated patient on medication and bacterial vaginosis.  To return to office for any worsening or ongoing symptoms.

## 2020-08-11 NOTE — Progress Notes (Signed)
BP 116/72    Pulse 62    Temp 97.8 F (36.6 C)    Wt 162 lb 12.8 oz (73.8 kg)    SpO2 95%    BMI 29.78 kg/m    Subjective:    Patient ID: Kristina Henson, female    DOB: 10-10-1955, 64 y.o.   MRN: [id number]  HPI: Kristina Henson is a 64 y.o. female  Chief Complaint  Patient presents with   Urinary Tract Infection    pt states she has urinary frequency for about 3 days, pt states urine has an odor    URINARY SYMPTOMS Started sometime last week.  Having more frequency and odor to urine. Currently has pneumonia and is being treated with Levofloxacin.   Dysuria: no Urinary frequency: yes Urgency: no Small volume voids: no Symptom severity: no Urinary incontinence: no Foul odor: yes Hematuria: no Abdominal pain: no Back pain: no Suprapubic pain/pressure: no Flank pain: no Fever:  no Vomiting: no Relief with cranberry juice: yes Relief with pyridium: no Status: stable Previous urinary tract infection: yes Recurrent urinary tract infection: no Sexual activity: No sexually active History of sexually transmitted disease: no Treatments attempted: increasing fluids  Relevant past medical, surgical, family and social history reviewed and updated as indicated. Interim medical history since our last visit reviewed. Allergies and medications reviewed and updated.  Review of Systems  Constitutional: Negative for activity change, appetite change, diaphoresis, fatigue and fever.  Respiratory: Negative for cough, chest tightness and shortness of breath.   Cardiovascular: Negative for chest pain, palpitations and leg swelling.  Gastrointestinal: Negative.   Genitourinary: Positive for frequency. Negative for decreased urine volume, dysuria, hematuria, pelvic pain, urgency, vaginal bleeding, vaginal discharge and vaginal pain.  Neurological: Negative.   Psychiatric/Behavioral: Negative.     Per HPI unless specifically indicated above     Objective:    BP 116/72    Pulse 62     Temp 97.8 F (36.6 C)    Wt 162 lb 12.8 oz (73.8 kg)    SpO2 95%    BMI 29.78 kg/m   Wt Readings from Last 3 Encounters:  08/11/20 162 lb 12.8 oz (73.8 kg)  08/08/20 150 lb (68 kg)  07/30/20 163 lb 9.6 oz (74.2 kg)    Physical Exam Vitals and nursing note reviewed.  Constitutional:      General: She is awake. She is not in acute distress.    Appearance: She is well-developed, well-groomed and overweight. She is not ill-appearing.  HENT:     Head: Normocephalic.     Right Ear: Hearing normal.     Left Ear: Hearing normal.  Eyes:     General: Lids are normal.        Right eye: No discharge.        Left eye: No discharge.     Conjunctiva/sclera: Conjunctivae normal.     Pupils: Pupils are equal, round, and reactive to light.  Neck:     Vascular: No carotid bruit.  Cardiovascular:     Rate and Rhythm: Normal rate and regular rhythm.     Heart sounds: Normal heart sounds. No murmur heard.  No gallop.   Pulmonary:     Effort: Pulmonary effort is normal. No accessory muscle usage or respiratory distress.     Breath sounds: Normal breath sounds.  Abdominal:     General: Bowel sounds are normal.     Palpations: Abdomen is soft. There is no hepatomegaly.  Tenderness: There is no abdominal tenderness. There is no right CVA tenderness or left CVA tenderness.  Musculoskeletal:     Cervical back: Normal range of motion and neck supple.     Right lower leg: No edema.     Left lower leg: No edema.  Skin:    General: Skin is warm and dry.  Neurological:     Mental Status: She is alert and oriented to person, place, and time.  Psychiatric:        Attention and Perception: Attention normal.        Mood and Affect: Mood normal.        Speech: Speech normal.        Behavior: Behavior normal. Behavior is cooperative.        Thought Content: Thought content normal.        Judgment: Judgment normal.     Results for orders placed or performed during the hospital encounter of  08/08/20  Respiratory Panel by RT PCR (Flu A&B, Covid) - Nasopharyngeal Swab   Specimen: Nasopharyngeal Swab  Result Value Ref Range   SARS Coronavirus 2 by RT PCR NEGATIVE NEGATIVE   Influenza A by PCR NEGATIVE NEGATIVE   Influenza B by PCR NEGATIVE NEGATIVE  Basic metabolic panel  Result Value Ref Range   Sodium 137 135 - 145 mmol/L   Potassium 4.2 3.5 - 5.1 mmol/L   Chloride 100 98 - 111 mmol/L   CO2 24 22 - 32 mmol/L   Glucose, Bld 110 (H) 70 - 99 mg/dL   BUN 32 (H) 8 - 23 mg/dL   Creatinine, Ser 0.94 0.44 - 1.00 mg/dL   Calcium 9.2 8.9 - 10.3 mg/dL   GFR, Estimated >60 >60 mL/min   Anion gap 13 5 - 15  CBC  Result Value Ref Range   WBC 5.8 4.0 - 10.5 K/uL   RBC 4.24 3.87 - 5.11 MIL/uL   Hemoglobin 11.2 (L) 12.0 - 15.0 g/dL   HCT 35.9 (L) 36 - 46 %   MCV 84.7 80.0 - 100.0 fL   MCH 26.4 26.0 - 34.0 pg   MCHC 31.2 30.0 - 36.0 g/dL   RDW 15.5 11.5 - 15.5 %   Platelets 248 150 - 400 K/uL   nRBC 0.0 0.0 - 0.2 %  Brain natriuretic peptide  Result Value Ref Range   B Natriuretic Peptide 45.6 0.0 - 100.0 pg/mL  Procalcitonin - Baseline  Result Value Ref Range   Procalcitonin <0.10 ng/mL  Magnesium  Result Value Ref Range   Magnesium 2.1 1.7 - 2.4 mg/dL  Fibrin derivatives D-Dimer ([facility] only)  Result Value Ref Range   Fibrin derivatives D-dimer ([facility]) 2,469.28 (H) 0.00 - 499.00 ng/mL (FEU)  Troponin I (High Sensitivity)  Result Value Ref Range   Troponin I (High Sensitivity) 4 <18 ng/L  Troponin I (High Sensitivity)  Result Value Ref Range   Troponin I (High Sensitivity) 4 <18 ng/L      Assessment & Plan:   Problem List Items Addressed This Visit      Other   Urinary frequency - Primary    Acute x one week with current abx use.  UA 2+ LEUK and 6-10 WBC, not other abnormal findings.  Wet prep performed and noted + clue cells, negative trich and yeast.  Will send in script for Flagyl.  Educated patient on medication and bacterial vaginosis.  To return to office for  any worsening or ongoing symptoms.  Relevant Orders   Urinalysis, Routine w reflex microscopic   WET PREP FOR TRICH, YEAST, CLUE    Other Visit Diagnoses    Pyuria       Send urine for culture   Relevant Orders   Urine Culture       Follow up plan: Return for as scheduled 09/03/20 with Dr.Johnson.

## 2020-08-13 LAB — URINE CULTURE: Organism ID, Bacteria: NO GROWTH

## 2020-08-13 NOTE — Progress Notes (Signed)
Please let Dallas know her urine returned showing no growth, no urine infection.:)

## 2020-08-26 ENCOUNTER — Telehealth: Payer: Self-pay

## 2020-08-26 DIAGNOSIS — Z23 Encounter for immunization: Secondary | ICD-10-CM | POA: Diagnosis not present

## 2020-08-26 NOTE — Telephone Encounter (Signed)
Copied from [place] [id number]. Topic: Quick Communication - See Telephone Encounter >> Aug 26, 2020  4:30 PM Loma [place] wrote: CRM for notification. See Telephone encounter for: 08/26/20. [phone] Pt wants a follow up with Dr Joanie Coddington nurse as had pneumonia and been in the hospital twice, her breathing is not really improving and breathless to walk around, knows was really sick but wants to understand "normal" Pt has apt on 09/03/2020 did she need to come in sooner?

## 2020-08-26 NOTE — Telephone Encounter (Signed)
Patient scheduled for a follow up on Thursday.

## 2020-08-26 NOTE — Telephone Encounter (Signed)
Please advise 

## 2020-08-26 NOTE — Telephone Encounter (Signed)
Yes, if not better, needs to be seen sooner

## 2020-08-27 ENCOUNTER — Ambulatory Visit: Payer: Medicare Other | Admitting: Family Medicine

## 2020-08-27 NOTE — Progress Notes (Deleted)
    SUBJECTIVE:   CHIEF COMPLAINT / HPI:   ED follow-up - Seen initially on 10/28 at [facility] ED for nonproductive cough, shortness of breath, and wheezing with general malaise. COVID negative. CXR and CT chest consistent with RLL PNA. D/ced with doxycycline and albuterol inhaler. ED f/u 6 days later with improvement in sx. - Seen again in ED 11/12 for persistent cough and new pleuritic L sided chest pain. CXR with new LLL opacity with improvement in RLL PNA. CTA PE negative. Switched antibiotic to levaquin - ***  Rpt CXR? Echo?  PERTINENT  PMH / PSH: Aortic atherosclerosis, A. fib, liver hemangioma, hypertensive renal disease, anemia, H/o CVA, HLD, HSV, insomnia  OBJECTIVE:   There were no vitals taken for this visit.  ***  ASSESSMENT/PLAN:   No problem-specific Assessment & Plan notes found for this encounter.     Myles Gip, DO [place]

## 2020-08-28 ENCOUNTER — Other Ambulatory Visit: Payer: Self-pay | Admitting: Family Medicine

## 2020-08-28 ENCOUNTER — Encounter: Payer: Self-pay | Admitting: Family Medicine

## 2020-08-28 ENCOUNTER — Ambulatory Visit (INDEPENDENT_AMBULATORY_CARE_PROVIDER_SITE_OTHER): Payer: Medicare Other | Admitting: Family Medicine

## 2020-08-28 ENCOUNTER — Ambulatory Visit
Admission: RE | Admit: 2020-08-28 | Discharge: 2020-08-28 | Disposition: A | Discharge status: Home / Self Care | Payer: Medicare Other | Source: Ambulatory Visit | Attending: Family Medicine | Admitting: Family Medicine

## 2020-08-28 ENCOUNTER — Other Ambulatory Visit: Payer: Self-pay

## 2020-08-28 ENCOUNTER — Ambulatory Visit
Admission: RE | Admit: 2020-08-28 | Discharge: 2020-08-28 | Disposition: A | Discharge status: Home / Self Care | Payer: Medicare Other | Attending: Family Medicine | Admitting: Family Medicine

## 2020-08-28 VITALS — BP 128/75 | HR 83 | Temp 99.1°F | Wt 162.0 lb

## 2020-08-28 DIAGNOSIS — R059 Cough, unspecified: Secondary | ICD-10-CM

## 2020-08-28 DIAGNOSIS — R0602 Shortness of breath: Secondary | ICD-10-CM | POA: Diagnosis not present

## 2020-08-28 DIAGNOSIS — J189 Pneumonia, unspecified organism: Secondary | ICD-10-CM | POA: Diagnosis not present

## 2020-08-28 DIAGNOSIS — I517 Cardiomegaly: Secondary | ICD-10-CM

## 2020-08-28 DIAGNOSIS — J9811 Atelectasis: Secondary | ICD-10-CM | POA: Diagnosis not present

## 2020-08-28 DIAGNOSIS — J90 Pleural effusion, not elsewhere classified: Secondary | ICD-10-CM | POA: Diagnosis not present

## 2020-08-28 MED ORDER — GUAIFENESIN ER 600 MG PO TB12: 600.0000 mg | ORAL_TABLET | Freq: Two times a day (BID) | ORAL | 0 refills | Status: AC

## 2020-08-28 MED ORDER — FUROSEMIDE 20 MG PO TABS: 20.0000 mg | ORAL_TABLET | Freq: Every day | ORAL | 0 refills | Status: DC

## 2020-08-28 NOTE — Assessment & Plan Note (Addendum)
Improving s/p 2 antibiotics with persistent cough. Comfortable WOB on RA, appropriately saturated with rhonchorous sounds at lung bases. Afebrile and otherwise well appearing. Will repeat CXR to assess improvement. Persistent cough likely 2/2 healing PNA. Doubt cardiac involvement given no evidence of volume overload PND, orthopnea, and prior BNPs wnl. Will trial BID mucinex. F/u in 1-2 weeks. Emergency precautions reviewed.

## 2020-08-28 NOTE — Patient Instructions (Signed)
It was great to see you!  Our plans for today:  - We are getting an xray of your lungs. We will call you with these results.  - Take the mucinex twice daily for the next week. - Come back in 1-2 weeks for follow up.   Take care and seek immediate care sooner if you develop any concerns.   Dr. Ky Barban

## 2020-08-28 NOTE — Progress Notes (Signed)
   SUBJECTIVE:   CHIEF COMPLAINT / HPI:   SHORTNESS OF BREATH - Seen previously 10/28 at [facility] ED for 2 day h/o cough, SOB, malaise. Dx w/ CAP - CXR and CT chest c/w RLL PNA. COVID negative. Improvement with albuterol. Tx w/ doxycycline. - Seen again 11/12 at [facility] ED for L sided pleuritic chest pain with persistent cough and SOB. Persistent CAP - abx changed to levaquin. CTA PE negative. CT chest with new LLL PNA, RLL improving. - feels better but with persistent nonproductive cough - finished abx about 1 week ago.  Chest tightness: no Wheezing: no Fevers: no Chest pain: no Palpitations: no  Nausea: no Diaphoresis: no Status: better Aggravating factors: nothing Alleviating factors: nothing Treatments attempted: antibiotics No swelling of legs.  Coughing worse with laying down.  Sleeps on 2 pillows at night. Hasn't changed recently.  No PND.  PERTINENT  PMH / PSH: afib, liver hemangioma, anemia, h/o CVA, insomnia  OBJECTIVE:   BP 128/75   Pulse 83   Temp 99.1 F (37.3 C)   Wt 162 lb (73.5 kg)   SpO2 92%   BMI 29.63 kg/m   Gen: overweight, in NAD Cardiac: RRR, no murmur Lungs: rhonchorous sounds at b/l bases R>L. Comfortable WOB on RA, appropriately saturated Ext: WWP, no edema  ASSESSMENT/PLAN:   Pneumonia Improving s/p 2 antibiotics with persistent cough. Comfortable WOB on RA, appropriately saturated with rhonchorous sounds at lung bases. Afebrile and otherwise well appearing. Will repeat CXR to assess improvement. Persistent cough likely 2/2 healing PNA. Doubt cardiac involvement given no evidence of volume overload PND, orthopnea, and prior BNPs wnl. Will trial BID mucinex. F/u in 1-2 weeks. Emergency precautions reviewed.    Myles Gip, DO

## 2020-09-03 ENCOUNTER — Ambulatory Visit: Payer: Medicare Other | Admitting: Family Medicine

## 2020-09-10 NOTE — Progress Notes (Signed)
° °  SUBJECTIVE:   CHIEF COMPLAINT / HPI:   Past Medical History:  Diagnosis Date   Anemia    Arthritis    BACK-LUMBAR   Atrial fibrillation ([place])    Coronary artery disease    Duodenitis    Dysrhythmia    Gastritis    Hypertension    Mass in neck    lt side   Pneumonia 2016   Stroke [facility]) 2012   Stroke [facility]) 2012   SHORTNESS OF BREATH, COUGH - seen previously 12/2 for ongoing cough with prior multiple ED visits and tx for PNA. CXR at that time with worsened pleural effusions and cardiac enlargement. Ordered echo and trialed low dose lasix. ECHO scheduled 12/30. - breathing better since starting lasix. Able to walk around more and for longer distances. Cough still the same, nonproductive. - urinating about the same.  - sleeps on 2 pillows, no change. No PND. - doesn't feel heavy in chest.  - remote h/o smoking as a teen, only tried a few times.   - no swelling of legs, did have some previously she reports. Fevers: no Chest pain: no Palpitations: no  Nausea: no Diaphoresis: no Status: better  Last saw Cardiology 04/2020.  ECHO 2018 with normal RV/LV function, mild LVH, mild AR, mild MR. EF >55%.  OBJECTIVE:   BP 120/77    Pulse (!) 111    Temp 98 F (36.7 C)    Wt 161 lb 3.2 oz (73.1 kg)    SpO2 94%    BMI 29.48 kg/m   Gen: overweight, in NAD Cardiac: regular rhythm, slightly tachycardic Lungs: CTAB with crackles in R lower base. Comfortable WOB on RA, appropriately saturated. No wheezing. Ext: no LE edema  Bedside US performed - Pleural effusion again seen at R lung base with B lines present bilaterally at bases. Cardiac wall motion appears normal though difficult to get adequate views.  ASSESSMENT/PLAN:   Cough Unclear etiology, cardiac vs pulmonary. Given improvement with lasix trial and presence of cardiac risk factors, concern for cardiac etiology. Bedside US did not reveal abnormal wall motion though was difficult to obtain adequate views,  awaiting scheduled formal ECHO. Will obtain BMP, BNP today. Pleural effusion again seen with bedside US and on auscultation with some consolidation, unlikely to be PNA given lack of fever and productive cough and otherwise improvement with lasix. Consider pulm referral pending ECHO results especially given remote questionable h/o autoimmune process per prior Heme/Onc notes. Lasix refill sent. F/u in 2-3 weeks after ECHO. Return and emergency precautions discussed.     Myles Gip, DO

## 2020-09-11 ENCOUNTER — Encounter: Payer: Self-pay | Admitting: Family Medicine

## 2020-09-11 ENCOUNTER — Other Ambulatory Visit: Payer: Self-pay

## 2020-09-11 ENCOUNTER — Ambulatory Visit (INDEPENDENT_AMBULATORY_CARE_PROVIDER_SITE_OTHER): Payer: Medicare Other | Admitting: Family Medicine

## 2020-09-11 VITALS — BP 120/77 | HR 111 | Temp 98.0°F | Wt 161.2 lb

## 2020-09-11 DIAGNOSIS — R059 Cough, unspecified: Secondary | ICD-10-CM

## 2020-09-11 DIAGNOSIS — I517 Cardiomegaly: Secondary | ICD-10-CM

## 2020-09-11 DIAGNOSIS — R0602 Shortness of breath: Secondary | ICD-10-CM | POA: Diagnosis not present

## 2020-09-11 MED ORDER — FUROSEMIDE 20 MG PO TABS
20.0000 mg | ORAL_TABLET | Freq: Every day | ORAL | 0 refills | Status: DC
Start: 2020-09-11 — End: 2020-11-03

## 2020-09-11 NOTE — Assessment & Plan Note (Signed)
Unclear etiology, cardiac vs pulmonary. Given improvement with lasix trial and presence of cardiac risk factors, concern for cardiac etiology. Bedside US did not reveal abnormal wall motion though was difficult to obtain adequate views, awaiting scheduled formal ECHO. Will obtain BMP, BNP today. Pleural effusion again seen with bedside US and on auscultation with some consolidation, unlikely to be PNA given lack of fever and productive cough and otherwise improvement with lasix. Consider pulm referral pending ECHO results especially given remote questionable h/o autoimmune process per prior Heme/Onc notes. Lasix refill sent. F/u in 2-3 weeks after ECHO. Return and emergency precautions discussed.

## 2020-09-11 NOTE — Patient Instructions (Signed)
It was great to see you!  Our plans for today:  - Take the furosemide every day as prescribed. This will help remove excess fluid from your lungs.  - Come back in 3 weeks after you get the ultrasound done of your heart.  - If you develop worsened breathing, fevers, nausea, vomiting, go to the Emergency Room.   We are checking some labs today, we will release these results to your MyChart.  Take care and seek immediate care sooner if you develop any concerns.   Dr. Ky Barban

## 2020-09-12 LAB — BASIC METABOLIC PANEL
BUN/Creatinine Ratio: 36 — ABNORMAL HIGH (ref 12–28)
BUN: 27 mg/dL (ref 8–27)
CO2: 26 mmol/L (ref 20–29)
Calcium: 9.3 mg/dL (ref 8.7–10.3)
Chloride: 94 mmol/L — ABNORMAL LOW (ref 96–106)
Creatinine, Ser: 0.75 mg/dL (ref 0.57–1.00)
GFR calc Af Amer: 99 mL/min/{1.73_m2} (ref >59)
GFR calc non Af Amer: 86 mL/min/{1.73_m2} (ref >59)
Glucose: 105 mg/dL — ABNORMAL HIGH (ref 65–99)
Potassium: 3.7 mmol/L (ref 3.5–5.2)
Sodium: 134 mmol/L (ref 134–144)

## 2020-09-12 LAB — BRAIN NATRIURETIC PEPTIDE: BNP: 177.9 pg/mL — ABNORMAL HIGH (ref 0.0–100.0)

## 2020-09-25 ENCOUNTER — Other Ambulatory Visit: Payer: Medicare Other

## 2020-10-02 ENCOUNTER — Encounter: Payer: Self-pay | Admitting: Family Medicine

## 2020-10-02 ENCOUNTER — Ambulatory Visit: Payer: Medicare Other | Admitting: Family Medicine

## 2020-10-02 ENCOUNTER — Other Ambulatory Visit: Payer: Self-pay

## 2020-10-02 ENCOUNTER — Ambulatory Visit (INDEPENDENT_AMBULATORY_CARE_PROVIDER_SITE_OTHER): Payer: Medicare Other | Admitting: Family Medicine

## 2020-10-02 VITALS — BP 122/70 | HR 69 | Temp 98.7°F | Wt 155.2 lb

## 2020-10-02 DIAGNOSIS — R059 Cough, unspecified: Secondary | ICD-10-CM | POA: Diagnosis not present

## 2020-10-02 DIAGNOSIS — D500 Iron deficiency anemia secondary to blood loss (chronic): Secondary | ICD-10-CM

## 2020-10-02 DIAGNOSIS — R0602 Shortness of breath: Secondary | ICD-10-CM | POA: Diagnosis not present

## 2020-10-02 DIAGNOSIS — I517 Cardiomegaly: Secondary | ICD-10-CM

## 2020-10-02 DIAGNOSIS — M62838 Other muscle spasm: Secondary | ICD-10-CM | POA: Diagnosis not present

## 2020-10-02 MED ORDER — BACLOFEN 10 MG PO TABS: 10.0000 mg | ORAL_TABLET | Freq: Every evening | ORAL | 3 refills | Status: DC | PRN

## 2020-10-02 NOTE — Progress Notes (Signed)
BP 122/70   Pulse 69   Temp 98.7 F (37.1 C)   Wt 155 lb 3.2 oz (70.4 kg)   SpO2 95%   BMI 28.39 kg/m    Subjective:    Patient ID: Kristina Henson, female    DOB: 1956-08-25, 65 y.o.   MRN: [id number]  HPI: Kristina Henson is a 65 y.o. female  Chief Complaint  Patient presents with  . Cough    Pt states she mostly coughs at night time. Pt states she is hurting at the top of her shoulders she believes it is from coughing a lot she feels like she is having muscle spasms.   . Shortness of Breath    Pt here to follow up shortness of breath, pt seems to be doing a little better    Kristina Henson presents today for follow up on her cough and breathing. She notes that she is not doing the deep deep breathing but is doing OK. Feeling a little better. Had some bad muscle spasms in her back a couple of days ago. She notes that her cough is better. Now not happening as often, happening at night. Doesn't change with position. She has been feeling more SOB with exertion- especially picking things up. Last x-ray was normal. Lungs looked good. She has not seen cardiology yet and has not had her echo yet. She does not note any side effects from the lasix and feels like it has been helping. No other concerns or complaints at this time.   Relevant past medical, surgical, family and social history reviewed and updated as indicated. Interim medical history since our last visit reviewed. Allergies and medications reviewed and updated.  Review of Systems  Constitutional: Negative.   HENT: Negative.   Respiratory: Positive for shortness of breath. Negative for apnea, cough, choking, chest tightness, wheezing and stridor.   Cardiovascular: Negative.   Gastrointestinal: Negative.   Psychiatric/Behavioral: Negative.     Per HPI unless specifically indicated above     Objective:    BP 122/70   Pulse 69   Temp 98.7 F (37.1 C)   Wt 155 lb 3.2 oz (70.4 kg)   SpO2 95%   BMI 28.39 kg/m   Wt Readings  from Last 3 Encounters:  10/02/20 155 lb 3.2 oz (70.4 kg)  09/11/20 161 lb 3.2 oz (73.1 kg)  08/28/20 162 lb (73.5 kg)    Physical Exam Vitals and nursing note reviewed.  Constitutional:      General: She is not in acute distress.    Appearance: Normal appearance. She is not ill-appearing, toxic-appearing or diaphoretic.  HENT:     Head: Normocephalic and atraumatic.     Right Ear: External ear normal.     Left Ear: External ear normal.     Nose: Nose normal.     Mouth/Throat:     Mouth: Mucous membranes are moist.     Pharynx: Oropharynx is clear.  Eyes:     General: No scleral icterus.       Right eye: No discharge.        Left eye: No discharge.     Extraocular Movements: Extraocular movements intact.     Conjunctiva/sclera: Conjunctivae normal.     Pupils: Pupils are equal, round, and reactive to light.  Cardiovascular:     Rate and Rhythm: Normal rate and regular rhythm.     Pulses: Normal pulses.     Heart sounds: Normal heart sounds. No murmur heard. No friction  rub. No gallop.   Pulmonary:     Effort: Pulmonary effort is normal. No respiratory distress.     Breath sounds: Normal breath sounds. No stridor. No wheezing, rhonchi or rales.  Chest:     Chest wall: No tenderness.  Musculoskeletal:        General: Normal range of motion.     Cervical back: Normal range of motion and neck supple.  Skin:    General: Skin is warm and dry.     Capillary Refill: Capillary refill takes less than 2 seconds.     Coloration: Skin is not jaundiced or pale.     Findings: No bruising, erythema, lesion or rash.  Neurological:     General: No focal deficit present.     Mental Status: She is alert and oriented to person, place, and time. Mental status is at baseline.  Psychiatric:        Mood and Affect: Mood normal.        Behavior: Behavior normal.        Thought Content: Thought content normal.        Judgment: Judgment normal.     Results for orders placed or performed in  visit on [id number]  Basic metabolic panel  Result Value Ref Range   Glucose 107 (H) 65 - 99 mg/dL   BUN 35 (H) 8 - 27 mg/dL   Creatinine, Ser 1.17 (H) 0.57 - 1.00 mg/dL   GFR calc non Af Amer 50 (L) >59 mL/min/1.73   GFR calc Af Amer 58 (L) >59 mL/min/1.73   BUN/Creatinine Ratio 30 (H) 12 - 28   Sodium 136 134 - 144 mmol/L   Potassium 3.9 3.5 - 5.2 mmol/L   Chloride 98 96 - 106 mmol/L   CO2 20 20 - 29 mmol/L   Calcium 9.3 8.7 - 10.3 mg/dL  Iron and TIBC  Result Value Ref Range   Total Iron Binding Capacity 217 (L) 250 - 450 ug/dL   UIBC 204 118 - 369 ug/dL   Iron 13 (L) 27 - 139 ug/dL   Iron Saturation 6 (LL) 15 - 55 %  CBC with Differential/Platelet  Result Value Ref Range   WBC 10.8 3.4 - 10.8 x10E3/uL   RBC 4.10 3.77 - 5.28 x10E6/uL   Hemoglobin 9.6 (L) 11.1 - 15.9 g/dL   Hematocrit 31.1 (L) 34.0 - 46.6 %   MCV 76 (L) 79 - 97 fL   MCH 23.4 (L) 26.6 - 33.0 pg   MCHC 30.9 (L) 31.5 - 35.7 g/dL   RDW 16.5 (H) 11.7 - 15.4 %   Platelets 275 150 - 450 x10E3/uL   Neutrophils 86 Not Estab. %   Lymphs 7 Not Estab. %   Monocytes 6 Not Estab. %   Eos 0 Not Estab. %   Basos 0 Not Estab. %   Neutrophils Absolute 9.3 (H) 1.4 - 7.0 x10E3/uL   Lymphocytes Absolute 0.8 0.7 - 3.1 x10E3/uL   Monocytes Absolute 0.6 0.1 - 0.9 x10E3/uL   EOS (ABSOLUTE) 0.0 0.0 - 0.4 x10E3/uL   Basophils Absolute 0.0 0.0 - 0.2 x10E3/uL   Immature Granulocytes 1 Not Estab. %   Immature Grans (Abs) 0.1 0.0 - 0.1 x10E3/uL  Ferritin  Result Value Ref Range   Ferritin 361 (H) 15 - 150 ng/mL      Assessment & Plan:   Problem List Items Addressed This Visit      Other   Iron deficiency anemia due to chronic blood loss  Rechecking levels today. Await results. Treat as needed.       Relevant Orders   Iron and TIBC (Completed)   CBC with Differential/Platelet (Completed)   Ferritin (Completed)   Cough - Primary    Feeling much better. Will check labs. To have ECHO shortly. To see cardiology as well.  Call with any concerns. Continue current regimen. Continue to monitor.        Other Visit Diagnoses    Cardiomegaly       Feeling much better. Will check labs. To have ECHO shortly. To see cardiology as well. Call with any concerns. Continue current regimen. Continue to monitor.    Relevant Orders   Basic metabolic panel (Completed)   Shortness of breath       Feeling much better. Will check labs. To have ECHO shortly. To see cardiology as well. Call with any concerns. Continue current regimen. Continue to monitor.    Muscle spasm       Will check labs. Await results. Treat as needed. Call with any concerns.        Follow up plan: Return in about 4 weeks (around 10/30/2020).   >25 minutes spent with patient today

## 2020-10-03 ENCOUNTER — Other Ambulatory Visit: Payer: Self-pay | Admitting: Family Medicine

## 2020-10-03 DIAGNOSIS — Z1231 Encounter for screening mammogram for malignant neoplasm of breast: Secondary | ICD-10-CM

## 2020-10-03 LAB — IRON AND TIBC
Iron Saturation: 6 % — CL (ref 15–55)
Iron: 13 ug/dL — ABNORMAL LOW (ref 27–139)
Total Iron Binding Capacity: 217 ug/dL — ABNORMAL LOW (ref 250–450)
UIBC: 204 ug/dL (ref 118–369)

## 2020-10-03 LAB — BASIC METABOLIC PANEL
BUN/Creatinine Ratio: 30 — ABNORMAL HIGH (ref 12–28)
BUN: 35 mg/dL — ABNORMAL HIGH (ref 8–27)
CO2: 20 mmol/L (ref 20–29)
Calcium: 9.3 mg/dL (ref 8.7–10.3)
Chloride: 98 mmol/L (ref 96–106)
Creatinine, Ser: 1.17 mg/dL — ABNORMAL HIGH (ref 0.57–1.00)
GFR calc Af Amer: 58 mL/min/{1.73_m2} — ABNORMAL LOW (ref >59)
GFR calc non Af Amer: 50 mL/min/{1.73_m2} — ABNORMAL LOW (ref >59)
Glucose: 107 mg/dL — ABNORMAL HIGH (ref 65–99)
Potassium: 3.9 mmol/L (ref 3.5–5.2)
Sodium: 136 mmol/L (ref 134–144)

## 2020-10-03 LAB — CBC WITH DIFFERENTIAL/PLATELET
Basophils Absolute: 0 10*3/uL (ref 0.0–0.2)
Basos: 0 %
EOS (ABSOLUTE): 0 10*3/uL (ref 0.0–0.4)
Eos: 0 %
Hematocrit: 31.1 % — ABNORMAL LOW (ref 34.0–46.6)
Hemoglobin: 9.6 g/dL — ABNORMAL LOW (ref 11.1–15.9)
Immature Grans (Abs): 0.1 10*3/uL (ref 0.0–0.1)
Immature Granulocytes: 1 %
Lymphocytes Absolute: 0.8 10*3/uL (ref 0.7–3.1)
Lymphs: 7 %
MCH: 23.4 pg — ABNORMAL LOW (ref 26.6–33.0)
MCHC: 30.9 g/dL — ABNORMAL LOW (ref 31.5–35.7)
MCV: 76 fL — ABNORMAL LOW (ref 79–97)
Monocytes Absolute: 0.6 10*3/uL (ref 0.1–0.9)
Monocytes: 6 %
Neutrophils Absolute: 9.3 10*3/uL — ABNORMAL HIGH (ref 1.4–7.0)
Neutrophils: 86 %
Platelets: 275 10*3/uL (ref 150–450)
RBC: 4.1 x10E6/uL (ref 3.77–5.28)
RDW: 16.5 % — ABNORMAL HIGH (ref 11.7–15.4)
WBC: 10.8 10*3/uL (ref 3.4–10.8)

## 2020-10-03 LAB — FERRITIN: Ferritin: 361 ng/mL — ABNORMAL HIGH (ref 15–150)

## 2020-10-06 ENCOUNTER — Other Ambulatory Visit: Payer: Self-pay | Admitting: Family Medicine

## 2020-10-06 DIAGNOSIS — N289 Disorder of kidney and ureter, unspecified: Secondary | ICD-10-CM

## 2020-10-09 ENCOUNTER — Telehealth: Payer: Self-pay | Admitting: Internal Medicine

## 2020-10-09 DIAGNOSIS — D638 Anemia in other chronic diseases classified elsewhere: Secondary | ICD-10-CM

## 2020-10-09 NOTE — Telephone Encounter (Signed)
Pt. Called and reported "low iron levels" from her 10/02/20 appt with Dr. Wynetta Emery (PCP). She has requested we move up her 01/23/21 appt.

## 2020-10-10 ENCOUNTER — Other Ambulatory Visit: Payer: Self-pay | Admitting: Internal Medicine

## 2020-10-10 NOTE — Telephone Encounter (Signed)
H-I reviewed pt's labs- I would agree moving up pt's appt to 2 weeks from now- MD; labs- cbc/venofer. Please schedue- GB

## 2020-10-10 NOTE — Telephone Encounter (Signed)
Dr. B - Please advise.  

## 2020-10-12 ENCOUNTER — Encounter: Payer: Self-pay | Admitting: Family Medicine

## 2020-10-12 NOTE — Assessment & Plan Note (Signed)
Rechecking levels today. Await results. Treat as needed.  

## 2020-10-12 NOTE — Assessment & Plan Note (Signed)
Feeling much better. Will check labs. To have ECHO shortly. To see cardiology as well. Call with any concerns. Continue current regimen. Continue to monitor.

## 2020-10-14 NOTE — Addendum Note (Signed)
Addended by: Gloris Ham on: 10/14/2020 03:24 PM   Modules accepted: Orders

## 2020-10-14 NOTE — Telephone Encounter (Signed)
Colette - please arrange for additional apts in approx. 2 weeks for lab/venofer

## 2020-10-16 ENCOUNTER — Ambulatory Visit (INDEPENDENT_AMBULATORY_CARE_PROVIDER_SITE_OTHER): Payer: Medicare Other

## 2020-10-16 ENCOUNTER — Other Ambulatory Visit: Payer: Self-pay

## 2020-10-16 DIAGNOSIS — I517 Cardiomegaly: Secondary | ICD-10-CM | POA: Diagnosis not present

## 2020-10-16 DIAGNOSIS — R0602 Shortness of breath: Secondary | ICD-10-CM | POA: Diagnosis not present

## 2020-10-16 DIAGNOSIS — I77810 Thoracic aortic ectasia: Secondary | ICD-10-CM

## 2020-10-16 HISTORY — DX: Thoracic aortic ectasia: I77.810

## 2020-10-16 LAB — ECHOCARDIOGRAM COMPLETE
Area-P 1/2: 4.54 cm2
S' Lateral: 3.1 cm

## 2020-10-24 ENCOUNTER — Ambulatory Visit: Payer: Medicare Other | Admitting: Family Medicine

## 2020-10-27 ENCOUNTER — Other Ambulatory Visit: Payer: Self-pay

## 2020-10-27 ENCOUNTER — Ambulatory Visit
Admission: RE | Admit: 2020-10-27 | Discharge: 2020-10-27 | Disposition: A | Discharge status: Home / Self Care | Payer: Medicare Other | Source: Ambulatory Visit | Attending: Family Medicine | Admitting: Family Medicine

## 2020-10-27 DIAGNOSIS — Z1231 Encounter for screening mammogram for malignant neoplasm of breast: Secondary | ICD-10-CM

## 2020-10-28 ENCOUNTER — Encounter: Payer: Self-pay | Admitting: Family Medicine

## 2020-10-28 ENCOUNTER — Ambulatory Visit (INDEPENDENT_AMBULATORY_CARE_PROVIDER_SITE_OTHER): Payer: Medicare Other | Admitting: Family Medicine

## 2020-10-28 VITALS — BP 119/72 | HR 68 | Temp 98.9°F

## 2020-10-28 DIAGNOSIS — I700 Atherosclerosis of aorta: Secondary | ICD-10-CM | POA: Diagnosis not present

## 2020-10-28 DIAGNOSIS — D638 Anemia in other chronic diseases classified elsewhere: Secondary | ICD-10-CM | POA: Diagnosis not present

## 2020-10-28 DIAGNOSIS — Z1211 Encounter for screening for malignant neoplasm of colon: Secondary | ICD-10-CM | POA: Diagnosis not present

## 2020-10-28 DIAGNOSIS — N289 Disorder of kidney and ureter, unspecified: Secondary | ICD-10-CM | POA: Diagnosis not present

## 2020-10-28 DIAGNOSIS — I4891 Unspecified atrial fibrillation: Secondary | ICD-10-CM | POA: Diagnosis not present

## 2020-10-28 NOTE — Progress Notes (Signed)
BP 119/72   Pulse 68   Temp 98.9 F (37.2 C) (Oral)   SpO2 98%    Subjective:    Patient ID: Kristina Henson, female    DOB: [birth year], 65 y.o.   MRN: [id number]  HPI: Kristina Henson is a 65 y.o. female  Chief Complaint  Patient presents with  . Shortness of Breath    4 week f/up   Kristina Henson presents today for follow up on her SOB. She is feeling a bit better, but continues to feel tired and has SOB with exertion. She had her ECHO done and thankfully it was normal. No sign of CHF. She has been off the lasix and still is feeling well.   ANEMIA Anemia status: exacerbated Etiology of anemia: multifactorial Duration of anemia treatment: chronic Compliance with treatment: good compliance Iron supplementation side effects: no Severity of anemia: moderate Fatigue: yes Decreased exercise tolerance: yes  Dyspnea on exertion: yes Palpitations: yes Bleeding: no Pica: no   Relevant past medical, surgical, family and social history reviewed and updated as indicated. Interim medical history since our last visit reviewed. Allergies and medications reviewed and updated.  Review of Systems  Constitutional: Negative.   Respiratory: Negative for apnea, cough, choking, chest tightness, shortness of breath, wheezing and stridor.   Cardiovascular: Negative.   Gastrointestinal: Negative.   Musculoskeletal: Negative.   Neurological: Negative.   Psychiatric/Behavioral: Negative.     Per HPI unless specifically indicated above     Objective:    BP 119/72   Pulse 68   Temp 98.9 F (37.2 C) (Oral)   SpO2 98%   Wt Readings from Last 3 Encounters:  10/31/20 152 lb 9 oz (69.2 kg)  10/02/20 155 lb 3.2 oz (70.4 kg)  09/11/20 161 lb 3.2 oz (73.1 kg)    Physical Exam Vitals and nursing note reviewed.  Constitutional:      General: She is not in acute distress.    Appearance: Normal appearance. She is not ill-appearing, toxic-appearing or diaphoretic.  HENT:     Head: Normocephalic  and atraumatic.     Right Ear: External ear normal.     Left Ear: External ear normal.     Nose: Nose normal.     Mouth/Throat:     Mouth: Mucous membranes are moist.     Pharynx: Oropharynx is clear.  Eyes:     General: No scleral icterus.       Right eye: No discharge.        Left eye: No discharge.     Extraocular Movements: Extraocular movements intact.     Conjunctiva/sclera: Conjunctivae normal.     Pupils: Pupils are equal, round, and reactive to light.  Cardiovascular:     Rate and Rhythm: Normal rate and regular rhythm.     Pulses: Normal pulses.     Heart sounds: Normal heart sounds. No murmur heard. No friction rub. No gallop.   Pulmonary:     Effort: Pulmonary effort is normal. No respiratory distress.     Breath sounds: Normal breath sounds. No stridor. No wheezing, rhonchi or rales.  Chest:     Chest wall: No tenderness.  Musculoskeletal:        General: Normal range of motion.     Cervical back: Normal range of motion and neck supple.  Skin:    General: Skin is warm and dry.     Capillary Refill: Capillary refill takes less than 2 seconds.     Coloration: Skin  is not jaundiced or pale.     Findings: No bruising, erythema, lesion or rash.  Neurological:     General: No focal deficit present.     Mental Status: She is alert and oriented to person, place, and time. Mental status is at baseline.  Psychiatric:        Mood and Affect: Mood normal.        Behavior: Behavior normal.        Thought Content: Thought content normal.        Judgment: Judgment normal.     Results for orders placed or performed in visit on 20/94/70  Basic metabolic panel  Result Value Ref Range   Glucose 98 65 - 99 mg/dL   BUN 26 8 - 27 mg/dL   Creatinine, Ser 0.93 0.57 - 1.00 mg/dL   GFR calc non Af Amer 66 >59 mL/min/1.73   GFR calc Af Amer 76 >59 mL/min/1.73   BUN/Creatinine Ratio 28 12 - 28   Sodium 141 134 - 144 mmol/L   Potassium 4.1 3.5 - 5.2 mmol/L   Chloride 103 96 -  106 mmol/L   CO2 25 20 - 29 mmol/L   Calcium 9.7 8.7 - 10.3 mg/dL  CBC with Differential/Platelet  Result Value Ref Range   WBC 4.3 3.4 - 10.8 x10E3/uL   RBC 4.61 3.77 - 5.28 x10E6/uL   Hemoglobin 10.8 (L) 11.1 - 15.9 g/dL   Hematocrit 34.8 34.0 - 46.6 %   MCV 76 (L) 79 - 97 fL   MCH 23.4 (L) 26.6 - 33.0 pg   MCHC 31.0 (L) 31.5 - 35.7 g/dL   RDW 19.2 (H) 11.7 - 15.4 %   Platelets 194 150 - 450 x10E3/uL   Neutrophils 61 Not Estab. %   Lymphs 25 Not Estab. %   Monocytes 11 Not Estab. %   Eos 1 Not Estab. %   Basos 1 Not Estab. %   Neutrophils Absolute 2.7 1.4 - 7.0 x10E3/uL   Lymphocytes Absolute 1.1 0.7 - 3.1 x10E3/uL   Monocytes Absolute 0.5 0.1 - 0.9 x10E3/uL   EOS (ABSOLUTE) 0.0 0.0 - 0.4 x10E3/uL   Basophils Absolute 0.0 0.0 - 0.2 x10E3/uL   Immature Granulocytes 1 Not Estab. %   Immature Grans (Abs) 0.0 0.0 - 0.1 x10E3/uL  Iron and TIBC  Result Value Ref Range   Total Iron Binding Capacity 247 (L) 250 - 450 ug/dL   UIBC 206 118 - 369 ug/dL   Iron 41 27 - 139 ug/dL   Iron Saturation 17 15 - 55 %  Ferritin  Result Value Ref Range   Ferritin 288 (H) 15 - 150 ng/mL      Assessment & Plan:   Problem List Items Addressed This Visit      Cardiovascular and Mediastinum   Atrial fibrillation (HCC)    NSR today. Call with any concerns. Continue to monitor.       Atherosclerosis of aorta ([place])    Does not want to take a statin at this time. Continue to monitor. Call with any concerns.         Other   Anemia due to chronic illness    Likely contributing to her SOB. To see hematology later this week. Will check labs today. Await results. Call with any concerns.       Relevant Orders   Ferritin   CBC with Differential/Platelet (Completed)   Iron and TIBC    Other Visit Diagnoses    Screening for  colon cancer    -  Primary   Referral to GI made today.    Relevant Orders   Ambulatory referral to Gastroenterology   Abnormal kidney function       Rechecking labs  today. Await results. Call with any concerns.        Follow up plan: Return in about 6 weeks (around 12/09/2020).

## 2020-10-29 ENCOUNTER — Encounter: Payer: Self-pay | Admitting: Family Medicine

## 2020-10-29 ENCOUNTER — Other Ambulatory Visit: Payer: Self-pay | Admitting: Family Medicine

## 2020-10-29 ENCOUNTER — Other Ambulatory Visit: Payer: Self-pay | Admitting: Unknown Physician Specialty

## 2020-10-29 LAB — CBC WITH DIFFERENTIAL/PLATELET
Basophils Absolute: 0 10*3/uL (ref 0.0–0.2)
Basos: 1 %
EOS (ABSOLUTE): 0 10*3/uL (ref 0.0–0.4)
Eos: 1 %
Hematocrit: 34.8 % (ref 34.0–46.6)
Hemoglobin: 10.8 g/dL — ABNORMAL LOW (ref 11.1–15.9)
Immature Grans (Abs): 0 10*3/uL (ref 0.0–0.1)
Immature Granulocytes: 1 %
Lymphocytes Absolute: 1.1 10*3/uL (ref 0.7–3.1)
Lymphs: 25 %
MCH: 23.4 pg — ABNORMAL LOW (ref 26.6–33.0)
MCHC: 31 g/dL — ABNORMAL LOW (ref 31.5–35.7)
MCV: 76 fL — ABNORMAL LOW (ref 79–97)
Monocytes Absolute: 0.5 10*3/uL (ref 0.1–0.9)
Monocytes: 11 %
Neutrophils Absolute: 2.7 10*3/uL (ref 1.4–7.0)
Neutrophils: 61 %
Platelets: 194 10*3/uL (ref 150–450)
RBC: 4.61 x10E6/uL (ref 3.77–5.28)
RDW: 19.2 % — ABNORMAL HIGH (ref 11.7–15.4)
WBC: 4.3 10*3/uL (ref 3.4–10.8)

## 2020-10-29 LAB — BASIC METABOLIC PANEL
BUN/Creatinine Ratio: 28 (ref 12–28)
BUN: 26 mg/dL (ref 8–27)
CO2: 25 mmol/L (ref 20–29)
Calcium: 9.7 mg/dL (ref 8.7–10.3)
Chloride: 103 mmol/L (ref 96–106)
Creatinine, Ser: 0.93 mg/dL (ref 0.57–1.00)
GFR calc Af Amer: 76 mL/min/{1.73_m2} (ref >59)
GFR calc non Af Amer: 66 mL/min/{1.73_m2} (ref >59)
Glucose: 98 mg/dL (ref 65–99)
Potassium: 4.1 mmol/L (ref 3.5–5.2)
Sodium: 141 mmol/L (ref 134–144)

## 2020-10-29 LAB — IRON AND TIBC
Iron Saturation: 17 % (ref 15–55)
Iron: 41 ug/dL (ref 27–139)
Total Iron Binding Capacity: 247 ug/dL — ABNORMAL LOW (ref 250–450)
UIBC: 206 ug/dL (ref 118–369)

## 2020-10-29 LAB — FERRITIN: Ferritin: 288 ng/mL — ABNORMAL HIGH (ref 15–150)

## 2020-10-29 NOTE — Telephone Encounter (Signed)
PT called to check on status of prescription. Please advise.

## 2020-10-30 ENCOUNTER — Other Ambulatory Visit: Payer: Self-pay

## 2020-10-30 DIAGNOSIS — D638 Anemia in other chronic diseases classified elsewhere: Secondary | ICD-10-CM

## 2020-10-30 DIAGNOSIS — D500 Iron deficiency anemia secondary to blood loss (chronic): Secondary | ICD-10-CM

## 2020-10-31 ENCOUNTER — Inpatient Hospital Stay (HOSPITAL_BASED_OUTPATIENT_CLINIC_OR_DEPARTMENT_OTHER): Payer: Medicare Other | Admitting: Internal Medicine

## 2020-10-31 ENCOUNTER — Inpatient Hospital Stay: Payer: Medicare Other | Attending: Internal Medicine

## 2020-10-31 ENCOUNTER — Encounter: Payer: Self-pay | Admitting: Internal Medicine

## 2020-10-31 ENCOUNTER — Inpatient Hospital Stay: Payer: Medicare Other

## 2020-10-31 DIAGNOSIS — D649 Anemia, unspecified: Secondary | ICD-10-CM | POA: Diagnosis not present

## 2020-10-31 DIAGNOSIS — Z7982 Long term (current) use of aspirin: Secondary | ICD-10-CM | POA: Insufficient documentation

## 2020-10-31 DIAGNOSIS — Z809 Family history of malignant neoplasm, unspecified: Secondary | ICD-10-CM | POA: Diagnosis not present

## 2020-10-31 DIAGNOSIS — D500 Iron deficiency anemia secondary to blood loss (chronic): Secondary | ICD-10-CM

## 2020-10-31 DIAGNOSIS — D72819 Decreased white blood cell count, unspecified: Secondary | ICD-10-CM | POA: Diagnosis not present

## 2020-10-31 DIAGNOSIS — Z79899 Other long term (current) drug therapy: Secondary | ICD-10-CM | POA: Insufficient documentation

## 2020-10-31 DIAGNOSIS — Z8673 Personal history of transient ischemic attack (TIA), and cerebral infarction without residual deficits: Secondary | ICD-10-CM | POA: Diagnosis not present

## 2020-10-31 DIAGNOSIS — I119 Hypertensive heart disease without heart failure: Secondary | ICD-10-CM | POA: Insufficient documentation

## 2020-10-31 DIAGNOSIS — Z8261 Family history of arthritis: Secondary | ICD-10-CM | POA: Insufficient documentation

## 2020-10-31 DIAGNOSIS — D638 Anemia in other chronic diseases classified elsewhere: Secondary | ICD-10-CM

## 2020-10-31 DIAGNOSIS — I4891 Unspecified atrial fibrillation: Secondary | ICD-10-CM | POA: Insufficient documentation

## 2020-10-31 DIAGNOSIS — D696 Thrombocytopenia, unspecified: Secondary | ICD-10-CM | POA: Insufficient documentation

## 2020-10-31 NOTE — Progress Notes (Signed)
[facility] CONSULT NOTE  Patient Care Team: Valerie Roys, DO as PCP - General (Family Medicine) Ubaldo Glassing Javier Docker, MD as Consulting Physician (Cardiology) Carloyn Manner, MD as Referring Physician (Otolaryngology) Minor, Dalbert Garnet, RN (Inactive) as [place] Management  CHIEF COMPLAINTS/PURPOSE OF CONSULTATION:   # Anemia of chronic disease/mild intermittent leucopenia/thromboctyopenia? Autoimmune- EGD/ [facility] [Dr.Wohl; 2017] chronic back pain [? 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     HISTORY OF PRESENTING ILLNESS:  Kristina Henson 65 y.o.  female noted Moderate anemia of Unclear etiology- iron deficiency versus chronic disease is here for follow-up.  Patient interim was diagnosed with pneumonia status post treatment with antibiotics.  Not related to Covid.  No blood in stools or black or stools.  She continues to be on p.o. iron once a day.   Review of Systems  Constitutional: Positive for malaise/fatigue. Negative for chills, diaphoresis, fever and weight loss.  HENT: Negative for nosebleeds and sore throat.   Eyes: Negative for double vision.  Respiratory: Negative for cough, hemoptysis, sputum production, shortness of breath and wheezing.   Cardiovascular: Negative for chest pain, palpitations, orthopnea and leg swelling.  Gastrointestinal: Negative for abdominal pain, blood in stool, constipation, diarrhea, heartburn, melena, nausea and vomiting.  Genitourinary: Negative for dysuria, frequency and urgency.  Musculoskeletal: Positive for joint pain. Negative for back pain.  Skin: Negative.  Negative for itching and rash.  Neurological: Negative for dizziness, tingling, focal weakness, weakness and  headaches.  Endo/Heme/Allergies: Does not bruise/bleed easily.  Psychiatric/Behavioral: Negative for depression. The patient is not nervous/anxious and does not have insomnia.      MEDICAL HISTORY:  Past Medical History:  Diagnosis Date  . Anemia   . Arthritis    BACK-LUMBAR  . Atrial fibrillation ([place])   . Coronary artery disease   . Duodenitis   . Dysrhythmia   . Gastritis   . Hypertension   . Mass in neck    lt side  . Pneumonia 2016  . Stroke ([place]) 2012  . Stroke [facility]) 2012    SURGICAL HISTORY: Past Surgical History:  Procedure Laterality Date  . ABDOMINAL HYSTERECTOMY     Total  . cardiac catherization    . COLONOSCOPY WITH PROPOFOL N/A 11/18/2015   Procedure: COLONOSCOPY WITH PROPOFOL;  Surgeon: Lucilla Lame, MD;  Location: [facility] END[facility]COPY;  Service: Endoscopy;  Laterality: N/A;  . CORONARY ANGIOPLASTY    . ESOPHAGOGASTRODUODENOSCOPY (EGD) WITH PROPOFOL N/A 04/23/2016   Procedure: ESOPHAGOGASTRODUODENOSCOPY (EGD) WITH PROPOFOL;  Surgeon: Lucilla Lame, MD;  Location: [facility];  Service: Endoscopy;  Laterality: N/A;  small bowel bx gastric antrum bx  . SUBMANDIBULAR GLAND EXCISION Left 09/08/2017   Procedure: EXCISION SUBMANDIBULAR GLAND;  Surgeon: Carloyn Manner, MD;  Location: [facility] ORS;  Service: ENT;  Laterality: Left;  . throat biopsy      SOCIAL HISTORY: Social History   Socioeconomic History  . Marital status: Divorced    Spouse name: Not on file  . Number of children: Not on file  . Years of education: 29  . Highest education level: 12th grade  Occupational History  . Occupation: disability   Tobacco Use  . Smoking status: Former Smoker    Quit date: 1978    Years since quitting: 44.1  .  Smokeless tobacco: Never Used  . Tobacco comment: didnt smoke much   Vaping Use  . Vaping Use: Never used  Substance and Sexual Activity  . Alcohol use: Not Currently  . Drug use: No  . Sexual activity: Yes  Other Topics Concern  . Not on file   Social History Narrative  . Not on file   Social Determinants of Health   Financial Resource Strain: Not on file  Food Insecurity: Not on file  Transportation Needs: Not on file  Physical Activity: Not on file  Stress: Not on file  Social Connections: Not on file  Intimate Partner Violence: Not on file    FAMILY HISTORY: Family History  Problem Relation Age of Onset  . Arthritis Mother   . Cancer Mother   . Hypertension Sister   . Asthma Brother   . Dementia Brother   . Breast cancer Neg Hx   . Kidney cancer Neg Hx   . Bladder Cancer Neg Hx     ALLERGIES:  is allergic to amoxicillin.  MEDICATIONS:  Current Outpatient Medications  Medication Sig Dispense Refill  . amiodarone (PACERONE) 200 MG tablet Take 1 tablet (200 mg total) by mouth every morning. 30 tablet 0  . amLODipine (NORVASC) 5 MG tablet Take 1 tablet by mouth once daily 90 tablet 0  . Ascorbic Acid (VITAMIN C) 100 MG tablet Take 100 mg by mouth daily.    Marland Kitchen aspirin EC 81 MG tablet Take 81 mg by mouth daily.     . baclofen (LIORESAL) 10 MG tablet Take 1 tablet (10 mg total) by mouth at bedtime as needed for muscle spasms. 30 each 3  . CRANBERRY PO Take by mouth.    . ferrous sulfate 325 (65 FE) MG EC tablet Take 1 tablet (325 mg total) by mouth 3 (three) times daily with meals. 270 tablet 1  . furosemide (LASIX) 20 MG tablet Take 1 tablet (20 mg total) by mouth daily. 30 tablet 0  . hydrALAZINE (APRESOLINE) 50 MG tablet Take 1 tablet by mouth twice daily 180 tablet 0  . lisinopril-hydrochlorothiazide (ZESTORETIC) 20-25 MG tablet Take 1 tablet by mouth twice daily 180 tablet 0  . VITAMIN D PO Take by mouth daily.    . traZODone (DESYREL) 50 MG tablet Take 0.5-1 tablets (25-50 mg total) by mouth at bedtime as needed for sleep. (Patient not taking: No sig reported) 30 tablet 2   No current facility-administered medications for this visit.      Marland Kitchen  PHYSICAL EXAMINATION: ECOG PERFORMANCE STATUS: 1 -  Symptomatic but completely ambulatory  Vitals:   10/31/20 1047  BP: 127/62  Pulse: 70  Temp: (!) 97.4 F (36.3 C)  SpO2: 99%   Filed Weights   10/31/20 1047  Weight: 152 lb 9 oz (69.2 kg)    Physical Exam HENT:     Head: Normocephalic and atraumatic.     Mouth/Throat:     Pharynx: No oropharyngeal exudate.  Eyes:     Pupils: Pupils are equal, round, and reactive to light.  Cardiovascular:     Rate and Rhythm: Normal rate and regular rhythm.  Pulmonary:     Effort: No respiratory distress.     Breath sounds: No wheezing.  Abdominal:     General: Bowel sounds are normal. There is no distension.     Palpations: Abdomen is soft. There is no mass.     Tenderness: There is no abdominal tenderness. There is no guarding or rebound.  Musculoskeletal:  General: No tenderness. Normal range of motion.     Cervical back: Normal range of motion and neck supple.  Skin:    General: Skin is warm.  Neurological:     Mental Status: She is alert and oriented to person, place, and time.  Psychiatric:        Mood and Affect: Affect normal.      LABORATORY DATA:  I have reviewed the data as listed Lab Results  Component Value Date   WBC 4.3 10/28/2020   HGB 10.8 (L) 10/28/2020   HCT 34.8 10/28/2020   MCV 76 (L) 10/28/2020   PLT 194 10/28/2020   Recent Labs    11/02/19 1459 01/24/20 0937 05/01/20 1148 07/24/20 1221 09/11/20 1141 10/02/20 1224 10/28/20 1358  NA 141   < > 141   < > 134 136 141  K 4.4   < > 4.1   < > 3.7 3.9 4.1  CL 104   < > 101   < > 94* 98 103  CO2 22   < > 25   < > 26 20 25   GLUCOSE 100*   < > 103*   < > 105* 107* 98  BUN 25   < > 25   < > 27 35* 26  CREATININE 1.03*   < > 1.03*   < > 0.75 1.17* 0.93  CALCIUM 9.6   < > 9.7   < > 9.3 9.3 9.7  GFRNONAA 59*   < > 58*   < > 86 50* 66  GFRAA 68   < > 67  --  99 58* 76  PROT 7.0  --  7.7  --   --   --   --   ALBUMIN 3.8  --  4.1  --   --   --   --   AST 37  --  23  --   --   --   --   ALT 62*  --   28  --   --   --   --   ALKPHOS 96  --  88  --   --   --   --   BILITOT 0.3  --  0.7  --   --   --   --    < > = values in this interval not displayed.    RADIOGRAPHIC STUDIES: I have personally reviewed the radiological images as listed and agreed with the findings in the report. MM 3D SCREEN BREAST BILATERAL  Result Date: 10/27/2020 CLINICAL DATA:  Screening. EXAM: DIGITAL SCREENING BILATERAL MAMMOGRAM WITH TOMO AND CAD COMPARISON:  Previous exam(s). ACR Breast Density Category b: There are scattered areas of fibroglandular density. FINDINGS: There are no findings suspicious for malignancy. The images were evaluated with computer-aided detection. IMPRESSION: No mammographic evidence of malignancy. A result letter of this screening mammogram will be mailed directly to the patient. RECOMMENDATION: Screening mammogram in one year. (Code:SM-B-01Y) BI-RADS CATEGORY  1: Negative. Electronically Signed   By: Nolon Nations M.D.   On: 10/27/2020 15:09   ECHOCARDIOGRAM COMPLETE  Result Date: 10/16/2020    ECHOCARDIOGRAM REPORT   Patient Name:   RAECHELL FOELLER Date of Exam: 10/16/2020 Medical Rec #:  [id number]       Height:       62.0 in Accession #:    [id number]      Weight:       155.2 lb Date of Birth:  [birth year]  BSA:          1.716 m Patient Age:    84 years        BP:           128/75 mmHg Patient Gender: F               HR:           100 bpm. Exam Location:  [facility] Procedure: 2D Echo, 3D Echo, Cardiac Doppler and Color Doppler Indications:    R06.02 SOB; R00.2 Palpitations; I51.7 Cardiomegaly  History:        Patient has no prior history of Echocardiogram examinations.                 Cardiomegaly, Arrythmias:Atrial Fibrillation,                 Signs/Symptoms:Shortness of Breath; Risk Factors:Hypertension                 and Non-Smoker.  Sonographer:    Geradine Girt Referring Phys: [id number] [place]  1. Left ventricular ejection fraction, by estimation, is 60 to 65%. The  left ventricle has normal function. The left ventricle has no regional wall motion abnormalities. There is mild to moderate left ventricular hypertrophy. Left ventricular diastolic parameters are indeterminate.  2. Right ventricular systolic function is normal. The right ventricular size is normal. There is normal pulmonary artery systolic pressure. The estimated right ventricular systolic pressure is [id number] mmHg.  3. The mitral valve is normal in structure. Mild mitral valve regurgitation.  4. There is borderline dilatation of the ascending aorta, measuring 37 mm.  5. A small pericardial effusion is present. FINDINGS  Left Ventricle: Left ventricular ejection fraction, by estimation, is 60 to 65%. The left ventricle has normal function. The left ventricle has no regional wall motion abnormalities. The left ventricular internal cavity size was normal in size. There is  mild to moderaet left ventricular hypertrophy. Left ventricular diastolic parameters are indeterminate. Right Ventricle: The right ventricular size is normal. No increase in right ventricular wall thickness. Right ventricular systolic function is normal. There is normal pulmonary artery systolic pressure. The tricuspid regurgitant velocity is 2.77 m/s, and  with an assumed right atrial pressure of 3 mmHg, the estimated right ventricular systolic pressure is [id number] mmHg. Left Atrium: Left atrial size was normal in size. Right Atrium: Right atrial size was normal in size. Pericardium: A small pericardial effusion is present. Mitral Valve: The mitral valve is normal in structure. Mild mitral valve regurgitation. No evidence of mitral valve stenosis. Tricuspid Valve: The tricuspid valve is normal in structure. Tricuspid valve regurgitation is mild . No evidence of tricuspid stenosis. Aortic Valve: The aortic valve was not well visualized. Aortic valve regurgitation is not visualized. No aortic stenosis is present. Pulmonic Valve: The pulmonic valve was normal  in structure. Pulmonic valve regurgitation is not visualized. No evidence of pulmonic stenosis. Aorta: The aortic root is normal in size and structure. There is borderline dilatation of the ascending aorta, measuring 37 mm. Venous: The inferior vena cava is normal in size with greater than 50% respiratory variability, suggesting right atrial pressure of 3 mmHg. IAS/Shunts: No atrial level shunt detected by color flow Doppler.  LEFT VENTRICLE PLAX 2D LVIDd:         4.40 cm LVIDs:         3.10 cm LV PW:         1.20 cm LV IVS:  1.40 cm LVOT diam:     2.20 cm  3D Volume EF: LV SV:         57       3D EF:        49 % LV SV Index:   33       LV EDV:       135 ml LVOT Area:     3.80 cm LV ESV:       69 ml                         LV SV:        66 ml RIGHT VENTRICLE TAPSE (M-mode): 1.3 cm LEFT ATRIUM             Index       RIGHT ATRIUM           Index LA diam:        3.90 cm 2.27 cm/m  RA Area:     15.90 cm LA Vol (A2C):   42.5 ml 24.76 ml/m RA Volume:   45.40 ml  26.45 ml/m LA Vol (A4C):   46.7 ml 27.21 ml/m LA Biplane Vol: 44.8 ml 26.10 ml/m  AORTIC VALVE LVOT Vmax:   84.20 cm/s LVOT Vmean:  51.700 cm/s LVOT VTI:    0.149 m  AORTA Ao Root diam: 3.10 cm Ao Asc diam:  3.70 cm MITRAL VALVE               TRICUSPID VALVE MV Area (PHT): 4.54 cm    TR Peak grad:   30.7 mmHg MV Decel Time: 167 msec    TR Vmax:        277.00 cm/s MV E velocity: 88.70 cm/s                            SHUNTS                            Systemic VTI:  0.15 m                            Systemic Diam: 2.20 cm Ida Rogue MD Electronically signed by Ida Rogue MD Signature Date/Time: 10/16/2020/6:37:08 PM    Final     ASSESSMENT & PLAN:   Anemia due to chronic illness # Anemia- likely IDA vs anemia of chronic disease  [unclear etiology]- s/p IV iron; improved. Hb ~10.8; [ferritin-240s; iron sat-17%-Feb 1st 2022]; continue PO iron once a day.  Hold any IV iron.  #Mild asymptomatic leukopenia/intermittent  neutropenia-STABLE  #Mild intermittent thrombocytopenia-today 194 Stable. ? Autoimmune process.    #Recent pneumonia/left lower lobe effusion improved-followed by PCP.  # DISPOSITION:  # no infusions today # follow up in 6 months-MD/cbc/bmp/iron studies/ferritin; possible Venofer-- Dr.B    All questions were answered. The patient knows to call the clinic with any problems, questions or concerns.     Cammie Sickle, MD 10/31/2020 1:19 PM

## 2020-10-31 NOTE — Assessment & Plan Note (Addendum)
#   Anemia- likely IDA vs anemia of chronic disease  [unclear etiology]- s/p IV iron; improved. Hb ~10.8; [ferritin-240s; iron sat-17%-Feb 1st 2022]; continue PO iron once a day.  Hold any IV iron.  #Mild asymptomatic leukopenia/intermittent neutropenia-STABLE  #Mild intermittent thrombocytopenia-today 194 Stable. ? Autoimmune process.    #Recent pneumonia/left lower lobe effusion improved-followed by PCP.  # DISPOSITION:  # no infusions today # follow up in 6 months-MD/cbc/bmp/iron studies/ferritin; possible Venofer-- Dr.B

## 2020-11-02 NOTE — Progress Notes (Signed)
Thanks for the message, Dr.B. :) GB

## 2020-11-03 NOTE — Assessment & Plan Note (Signed)
NSR today. Call with any concerns. Continue to monitor.

## 2020-11-03 NOTE — Assessment & Plan Note (Signed)
Likely contributing to her SOB. To see hematology later this week. Will check labs today. Await results. Call with any concerns.

## 2020-11-03 NOTE — Assessment & Plan Note (Signed)
Does not want to take a statin at this time. Continue to monitor. Call with any concerns.

## 2020-11-04 ENCOUNTER — Encounter: Payer: Self-pay | Admitting: Gastroenterology

## 2020-11-04 ENCOUNTER — Telehealth: Payer: Medicare Other

## 2020-11-04 ENCOUNTER — Telehealth: Payer: Self-pay

## 2020-11-04 ENCOUNTER — Ambulatory Visit: Payer: Medicare Other | Admitting: Family Medicine

## 2020-11-04 NOTE — Telephone Encounter (Signed)
Unable to contact patient for her 10am nurse triage call.  LVM for her to call me back directly or call to the front office as soon as she gets this message.  Thanks,  [place], [place]

## 2020-11-05 ENCOUNTER — Ambulatory Visit: Payer: Medicare Other

## 2020-11-07 ENCOUNTER — Ambulatory Visit (INDEPENDENT_AMBULATORY_CARE_PROVIDER_SITE_OTHER): Payer: Medicare Other

## 2020-11-07 VITALS — Ht 62.0 in | Wt 151.0 lb

## 2020-11-07 DIAGNOSIS — Z0000 Encounter for general adult medical examination without abnormal findings: Secondary | ICD-10-CM | POA: Diagnosis not present

## 2020-11-07 NOTE — Progress Notes (Signed)
I connected with Kristina Henson today by telephone and verified that I am speaking with the correct person using two identifiers. Location patient: home Location provider: work Persons participating in the virtual visit: Shauniece, Kwan LPN.   I discussed the limitations, risks, security and privacy concerns of performing an evaluation and management service by telephone and the availability of in person appointments. I also discussed with the patient that there may be a patient responsible charge related to this service. The patient expressed understanding and verbally consented to this telephonic visit.    Interactive audio and video telecommunications were attempted between this provider and patient, however failed, due to patient having technical difficulties OR patient did not have access to video capability.  We continued and completed visit with audio only.     Vital signs may be patient reported or missing.  Subjective:   Kristina Henson is a 65 y.o. female who presents for Medicare Annual (Subsequent) preventive examination.  Review of Systems     Cardiac Risk Factors include: sedentary lifestyle     Objective:    Today's Vitals   11/07/20 0941  Weight: 151 lb (68.5 kg)  Height: 5\' 2"  (1.575 m)   Body mass index is 27.62 kg/m.  Advanced Directives 11/07/2020 08/08/2020 07/24/2020 10/31/2019 07/26/2019 01/24/2019 11/05/2018  Does Patient Have a Medical Advance Directive? No No No No No No No  Does patient want to make changes to medical advance directive? - - - - - - -  Would patient like information on creating a medical advance directive? - No - Patient declined No - Patient declined - No - Patient declined No - Patient declined No - Patient declined    Current Medications (verified) Outpatient Encounter Medications as of 11/07/2020  Medication Sig  . amiodarone (PACERONE) 200 MG tablet Take 1 tablet (200 mg total) by mouth every morning.  Marland Kitchen amLODipine  (NORVASC) 5 MG tablet Take 1 tablet by mouth once daily  . Ascorbic Acid (VITAMIN C) 100 MG tablet Take 100 mg by mouth daily.  Marland Kitchen aspirin EC 81 MG tablet Take 81 mg by mouth daily.   . baclofen (LIORESAL) 10 MG tablet Take 1 tablet (10 mg total) by mouth at bedtime as needed for muscle spasms.  Marland Kitchen CRANBERRY PO Take by mouth.  . ferrous sulfate 325 (65 FE) MG EC tablet Take 1 tablet (325 mg total) by mouth 3 (three) times daily with meals.  . hydrALAZINE (APRESOLINE) 50 MG tablet Take 1 tablet by mouth twice daily  . lisinopril-hydrochlorothiazide (ZESTORETIC) 20-25 MG tablet Take 1 tablet by mouth twice daily  . VITAMIN D PO Take by mouth daily.  . traZODone (DESYREL) 50 MG tablet Take 0.5-1 tablets (25-50 mg total) by mouth at bedtime as needed for sleep. (Patient not taking: No sig reported)   No facility-administered encounter medications on file as of 11/07/2020.    Allergies (verified) Amoxicillin   History: Past Medical History:  Diagnosis Date  . Anemia   . Arthritis    BACK-LUMBAR  . Atrial fibrillation ([place])   . Coronary artery disease   . Duodenitis   . Dysrhythmia   . Gastritis   . Hypertension   . Mass in neck    lt side  . Pneumonia 2016  . Stroke ([place]) 2012  . Stroke [facility]) 2012   Past Surgical History:  Procedure Laterality Date  . ABDOMINAL HYSTERECTOMY     Total  . cardiac catherization    . COLONOSCOPY  WITH PROPOFOL N/A 11/18/2015   Procedure: COLONOSCOPY WITH PROPOFOL;  Surgeon: Lucilla Lame, MD;  Location: [facility] ENDOSCOPY;  Service: Endoscopy;  Laterality: N/A;  . CORONARY ANGIOPLASTY    . ESOPHAGOGASTRODUODENOSCOPY (EGD) WITH PROPOFOL N/A 04/23/2016   Procedure: ESOPHAGOGASTRODUODENOSCOPY (EGD) WITH PROPOFOL;  Surgeon: Lucilla Lame, MD;  Location: [place];  Service: Endoscopy;  Laterality: N/A;  small bowel bx gastric antrum bx  . SUBMANDIBULAR GLAND EXCISION Left 09/08/2017   Procedure: EXCISION SUBMANDIBULAR GLAND;  Surgeon: Carloyn Manner,  MD;  Location: [facility] ORS;  Service: ENT;  Laterality: Left;  . throat biopsy     Family History  Problem Relation Age of Onset  . Arthritis Mother   . Cancer Mother   . Hypertension Sister   . Asthma Brother   . Dementia Brother   . Breast cancer Neg Hx   . Kidney cancer Neg Hx   . Bladder Cancer Neg Hx    Social History   Socioeconomic History  . Marital status: Divorced    Spouse name: Not on file  . Number of children: Not on file  . Years of education: 16  . Highest education level: 12th grade  Occupational History  . Occupation: disability   Tobacco Use  . Smoking status: Former Smoker    Quit date: 1978    Years since quitting: 44.1  . Smokeless tobacco: Never Used  . Tobacco comment: didnt smoke much   Vaping Use  . Vaping Use: Never used  Substance and Sexual Activity  . Alcohol use: Not Currently  . Drug use: No  . Sexual activity: Yes  Other Topics Concern  . Not on file  Social History Narrative  . Not on file   Social Determinants of Health   Financial Resource Strain: Low Risk   . Difficulty of Paying Living Expenses: Not hard at all  Food Insecurity: No Food Insecurity  . Worried About Charity fundraiser in the Last Year: Never true  . Ran Out of Food in the Last Year: Never true  Transportation Needs: No Transportation Needs  . Lack of Transportation (Medical): No  . Lack of Transportation (Non-Medical): No  Physical Activity: Inactive  . Days of Exercise per Week: 0 days  . Minutes of Exercise per Session: 0 min  Stress: No Stress Concern Present  . Feeling of Stress : Not at all  Social Connections: Not on file    Tobacco Counseling Counseling given: Not Answered Comment: didnt smoke much    Clinical Intake:  Pre-visit preparation completed: Yes  Pain : No/denies pain     Nutritional Status: BMI 25 -29 Overweight Nutritional Risks: None Diabetes: No  How often do you need to have someone help you when you read instructions,  pamphlets, or other written materials from your doctor or pharmacy?: 1 - Never What is the last grade level you completed in school?: 12th grade  Diabetic? no  Interpreter Needed?: No  Information entered by :: NAllen LPN   Activities of Daily Living In your present state of health, do you have any difficulty performing the following activities: 11/07/2020  Hearing? N  Vision? N  Difficulty concentrating or making decisions? N  Walking or climbing stairs? N  Dressing or bathing? N  Doing errands, shopping? N  Preparing Food and eating ? N  Using the Toilet? N  In the past six months, have you accidently leaked urine? N  Do you have problems with loss of bowel control? N  Managing  your Medications? N  Managing your Finances? N  Housekeeping or managing your Housekeeping? N  Some recent data might be hidden    Patient Care Team: Valerie Roys, DO as PCP - General (Family Medicine) Ubaldo Glassing Javier Docker, MD as Consulting Physician (Cardiology) Carloyn Manner, MD as Referring Physician (Otolaryngology) Minor, Dalbert Garnet, RN (Inactive) as [place] any recent [place] you may have received from other than [facility] providers in the past year (date may be approximate).     Assessment:   This is a routine wellness examination for [place].  Hearing/Vision screen  Hearing Screening   125Hz  250Hz  500Hz  1000Hz  2000Hz  3000Hz  4000Hz  6000Hz  8000Hz   Right ear:           Left ear:           Vision Screening Comments: No regular eye exams, [facility]  Dietary issues and exercise activities discussed: Current Exercise Habits: The patient does not participate in regular exercise at present  Goals    . DIET - INCREASE WATER INTAKE     Recommend drinking at least 6-8 glasses of water a day     . Patient Stated     11/07/2020, drink more water      Depression Screen PHQ 2/9 Scores 11/07/2020 10/31/2019 10/16/2018 04/03/2018 10/03/2017 07/19/2017  08/11/2016  PHQ - 2 Score 0 0 0 0 0 0 0  PHQ- 9 Score - - 0 0 - - -    Fall Risk Fall Risk  11/07/2020 10/31/2019 10/16/2018 04/03/2018 10/03/2017  Falls in the past year? 1 0 0 No No  Number falls in past yr: - 0 0 - -  Injury with Fall? - 0 0 - -  Risk for fall due to : Medication side effect - - - -  Follow up Falls evaluation completed;Education provided;Falls prevention discussed - - - -    FALL RISK PREVENTION PERTAINING TO THE HOME:  Any stairs in or around the home? Yes  If so, are there any without handrails? No  Home free of loose throw rugs in walkways, pet beds, electrical cords, etc? Yes  Adequate lighting in your home to reduce risk of falls? Yes   ASSISTIVE DEVICES UTILIZED TO PREVENT FALLS:  Life alert? No  Use of a cane, walker or w/c? No  Grab bars in the bathroom? Yes  Shower chair or bench in shower? Yes  Elevated toilet seat or a handicapped toilet? No   TIMED UP AND GO:  Was the test performed? No .    Cognitive Function:     6CIT Screen 11/07/2020 10/16/2018 10/03/2017 08/11/2016  What Year? 4 points 0 points 0 points 0 points  What month? 0 points 0 points 0 points 0 points  What time? 0 points 0 points 0 points 0 points  Count back from 20 2 points 0 points 0 points 2 points  Months in reverse 4 points 0 points 0 points 0 points  Repeat phrase 6 points 4 points 0 points 2 points  Total Score 16 4 0 4    Immunizations Immunization History  Administered Date(s) Administered  . Influenza,inj,Quad PF,6+ Mos 06/25/2015, 06/16/2017, 06/09/2018, 07/08/2019  . Influenza-Unspecified 06/29/2016, 05/28/2020  . Moderna Sars-Covid-2 Vaccination 11/15/2019, 12/13/2019  . Pneumococcal Polysaccharide-23 07/29/2015  . Td 01/27/2015    TDAP status: Up to date  Flu Vaccine status: Up to date  Pneumococcal vaccine status: Up to date  Covid-19 vaccine status: Completed vaccines  Qualifies for  Shingles Vaccine? Yes   Zostavax completed No   Shingrix  Completed?: No.    Education has been provided regarding the importance of this vaccine. Patient has been advised to call insurance company to determine out of pocket expense if they have not yet received this vaccine. Advised may also receive vaccine at local pharmacy or Health Dept. Verbalized acceptance and understanding.  Screening Tests Health Maintenance  Topic Date Due  . COVID-19 Vaccine (3 - Booster for Moderna series) 06/14/2020  . COLONOSCOPY (Pts 45-34yrs Insurance coverage will need to be confirmed)  11/17/2020  . MAMMOGRAM  10/27/2022  . TETANUS/TDAP  01/26/2025  . INFLUENZA VACCINE  Completed  . Hepatitis C Screening  Completed  . HIV Screening  Completed    Health Maintenance  Health Maintenance Due  Topic Date Due  . COVID-19 Vaccine (3 - Booster for Moderna series) 06/14/2020    Colorectal cancer screening: Type of screening: Colonoscopy. Completed 11/18/2015. Repeat every 5 years  Mammogram status: Completed 10/27/2020. Repeat every year  Bone Density status: Completed 07/02/2015.   Lung Cancer Screening: (Low Dose CT Chest recommended if Age 26-80 years, 30 pack-year currently smoking OR have quit w/in 15years.) does not qualify.   Lung Cancer Screening Referral: no  Additional Screening:  Hepatitis C Screening: does qualify; Completed 04/11/2017   Vision Screening: Recommended annual ophthalmology exams for early detection of glaucoma and other disorders of the eye. Is the patient up to date with their annual eye exam?  No  Who is the provider or what is the name of the office in which the patient attends annual eye exams? [facility] If pt is not established with a provider, would they like to be referred to a provider to establish care? No .   Dental Screening: Recommended annual dental exams for proper oral hygiene  Community Resource Referral / Chronic Care Management: CRR required this visit?  No   CCM required this visit?  No      Plan:     I  have personally reviewed and noted the following in the patient's chart:   . Medical and social history . Use of alcohol, tobacco or illicit drugs  . Current medications and supplements . Functional ability and status . Nutritional status . Physical activity . Advanced directives . List of other physicians . Hospitalizations, surgeries, and ER visits in previous 12 months . Vitals . Screenings to include cognitive, depression, and falls . Referrals and appointments  In addition, I have reviewed and discussed with patient certain preventive protocols, quality metrics, and best practice recommendations. A written personalized care plan for preventive services as well as general preventive health recommendations were provided to patient.     Kellie Simmering, LPN   8/33/8250   Nurse Notes:

## 2020-11-07 NOTE — Patient Instructions (Signed)
Kristina Henson , Thank you for taking time to come for your Medicare Wellness Visit. I appreciate your ongoing commitment to your health goals. Please review the following plan we discussed and let me know if I can assist you in the future.   Screening recommendations/referrals: Colonoscopy: completed 11/18/2015, due 11/17/2020 Mammogram: completed 10/27/2020 Bone Density: completed 07/02/2015 Recommended yearly ophthalmology/optometry visit for glaucoma screening and checkup Recommended yearly dental visit for hygiene and checkup  Vaccinations: Influenza vaccine: completed 05/28/2020, due 04/27/2021 Pneumococcal vaccine: completed 07/29/2015 Tdap vaccine: completed 01/27/2015, due 01/26/2025 Shingles vaccine: discussed  Covid-19:  12/13/2019, 11/15/2019  Advanced directives: Advance directive discussed with you today.  Conditions/risks identified: none  Next appointment: Follow up in one year for your annual wellness visit.   Preventive Care 40-64 Years, Female Preventive care refers to lifestyle choices and visits with your health care provider that can promote health and wellness. What does preventive care include?  A yearly physical exam. This is also called an annual well check.  Dental exams once or twice a year.  Routine eye exams. Ask your health care provider how often you should have your eyes checked.  Personal lifestyle choices, including:  Daily care of your teeth and gums.  Regular physical activity.  Eating a healthy diet.  Avoiding tobacco and drug use.  Limiting alcohol use.  Practicing safe sex.  Taking low-dose aspirin daily starting at age 12.  Taking vitamin and mineral supplements as recommended by your health care provider. What happens during an annual well check? The services and screenings done by your health care provider during your annual well check will depend on your age, overall health, lifestyle risk factors, and family history of disease. Counseling   Your health care provider may ask you questions about your:  Alcohol use.  Tobacco use.  Drug use.  Emotional well-being.  Home and relationship well-being.  Sexual activity.  Eating habits.  Work and work Statistician.  Method of birth control.  Menstrual cycle.  Pregnancy history. Screening  You may have the following tests or measurements:  Height, weight, and BMI.  Blood pressure.  Lipid and cholesterol levels. These may be checked every 5 years, or more frequently if you are over 32 years old.  Skin check.  Lung cancer screening. You may have this screening every year starting at age 51 if you have a 30-pack-year history of smoking and currently smoke or have quit within the past 15 years.  Fecal occult blood test (FOBT) of the stool. You may have this test every year starting at age 16.  Flexible sigmoidoscopy or colonoscopy. You may have a sigmoidoscopy every 5 years or a colonoscopy every 10 years starting at age 28.  Hepatitis C blood test.  Hepatitis B blood test.  Sexually transmitted disease (STD) testing.  Diabetes screening. This is done by checking your blood sugar (glucose) after you have not eaten for a while (fasting). You may have this done every 1-3 years.  Mammogram. This may be done every 1-2 years. Talk to your health care provider about when you should start having regular mammograms. This may depend on whether you have a family history of breast cancer.  BRCA-related cancer screening. This may be done if you have a family history of breast, ovarian, tubal, or peritoneal cancers.  Pelvic exam and Pap test. This may be done every 3 years starting at age 39. Starting at age 5, this may be done every 5 years if you have a Pap test  in combination with an HPV test.  Bone density scan. This is done to screen for osteoporosis. You may have this scan if you are at high risk for osteoporosis. Discuss your test results, treatment options, and if  necessary, the need for more tests with your health care provider. Vaccines  Your health care provider may recommend certain vaccines, such as:  Influenza vaccine. This is recommended every year.  Tetanus, diphtheria, and acellular pertussis (Tdap, Td) vaccine. You may need a Td booster every 10 years.  Zoster vaccine. You may need this after age 21.  Pneumococcal 13-valent conjugate (PCV13) vaccine. You may need this if you have certain conditions and were not previously vaccinated.  Pneumococcal polysaccharide (PPSV23) vaccine. You may need one or two doses if you smoke cigarettes or if you have certain conditions. Talk to your health care provider about which screenings and vaccines you need and how often you need them. This information is not intended to replace advice given to you by your health care provider. Make sure you discuss any questions you have with your health care provider. Document Released: 10/10/2015 Document Revised: 06/02/2016 Document Reviewed: 07/15/2015 Elsevier Interactive Patient Education  2017 [place] Prevention in the Home Falls can cause injuries. They can happen to people of all ages. There are many things you can do to make your home safe and to help prevent falls. What can I do on the outside of my home?  Regularly fix the edges of walkways and driveways and fix any cracks.  Remove anything that might make you trip as you walk through a door, such as a raised step or threshold.  Trim any bushes or trees on the path to your home.  Use bright outdoor lighting.  Clear any walking paths of anything that might make someone trip, such as rocks or tools.  Regularly check to see if handrails are loose or broken. Make sure that both sides of any steps have handrails.  Any raised decks and porches should have guardrails on the edges.  Have any leaves, snow, or ice cleared regularly.  Use sand or salt on walking paths during  winter.  Clean up any spills in your garage right away. This includes oil or grease spills. What can I do in the bathroom?  Use night lights.  Install grab bars by the toilet and in the tub and shower. Do not use towel bars as grab bars.  Use non-skid mats or decals in the tub or shower.  If you need to sit down in the shower, use a plastic, non-slip stool.  Keep the floor dry. Clean up any water that spills on the floor as soon as it happens.  Remove soap buildup in the tub or shower regularly.  Attach bath mats securely with double-sided non-slip rug tape.  Do not have throw rugs and other things on the floor that can make you trip. What can I do in the bedroom?  Use night lights.  Make sure that you have a light by your bed that is easy to reach.  Do not use any sheets or blankets that are too big for your bed. They should not hang down onto the floor.  Have a firm chair that has side arms. You can use this for support while you get dressed.  Do not have throw rugs and other things on the floor that can make you trip. What can I do in the kitchen?  Clean up any  spills right away.  Avoid walking on wet floors.  Keep items that you use a lot in easy-to-reach places.  If you need to reach something above you, use a strong step stool that has a grab bar.  Keep electrical cords out of the way.  Do not use floor polish or wax that makes floors slippery. If you must use wax, use non-skid floor wax.  Do not have throw rugs and other things on the floor that can make you trip. What can I do with my stairs?  Do not leave any items on the stairs.  Make sure that there are handrails on both sides of the stairs and use them. Fix handrails that are broken or loose. Make sure that handrails are as long as the stairways.  Check any carpeting to make sure that it is firmly attached to the stairs. Fix any carpet that is loose or worn.  Avoid having throw rugs at the top or  bottom of the stairs. If you do have throw rugs, attach them to the floor with carpet tape.  Make sure that you have a light switch at the top of the stairs and the bottom of the stairs. If you do not have them, ask someone to add them for you. What else can I do to help prevent falls?  Wear shoes that:  Do not have high heels.  Have rubber bottoms.  Are comfortable and fit you well.  Are closed at the toe. Do not wear sandals.  If you use a stepladder:  Make sure that it is fully opened. Do not climb a closed stepladder.  Make sure that both sides of the stepladder are locked into place.  Ask someone to hold it for you, if possible.  Clearly mark and make sure that you can see:  Any grab bars or handrails.  First and last steps.  Where the edge of each step is.  Use tools that help you move around (mobility aids) if they are needed. These include:  Canes.  Walkers.  Scooters.  Crutches.  Turn on the lights when you go into a dark area. Replace any light bulbs as soon as they burn out.  Set up your furniture so you have a clear path. Avoid moving your furniture around.  If any of your floors are uneven, fix them.  If there are any pets around you, be aware of where they are.  Review your medicines with your doctor. Some medicines can make you feel dizzy. This can increase your chance of falling. Ask your doctor what other things that you can do to help prevent falls. This information is not intended to replace advice given to you by your health care provider. Make sure you discuss any questions you have with your health care provider. Document Released: 07/10/2009 Document Revised: 02/19/2016 Document Reviewed: 10/18/2014 Elsevier Interactive Patient Education  2017 [employer].

## 2020-11-14 ENCOUNTER — Other Ambulatory Visit: Payer: Self-pay

## 2020-11-14 ENCOUNTER — Telehealth (INDEPENDENT_AMBULATORY_CARE_PROVIDER_SITE_OTHER): Payer: Self-pay | Admitting: Gastroenterology

## 2020-11-14 DIAGNOSIS — Z86010 Personal history of colonic polyps: Secondary | ICD-10-CM

## 2020-11-14 MED ORDER — NA SULFATE-K SULFATE-MG SULF 17.5-3.13-1.6 GM/177ML PO SOLN: 1.0000 | Freq: Once | ORAL | 0 refills | Status: AC

## 2020-11-14 NOTE — Progress Notes (Signed)
Gastroenterology Pre-Procedure Review  Request Date: Tuesday 12/02/20 Requesting Physician: Dr. Allen Norris  PATIENT REVIEW QUESTIONS: The patient responded to the following health history questions as indicated:    1. Are you having any GI issues? no 2. Do you have a personal history of Polyps? yes (11/18/15 colonoscopy performed by Dr. Allen Norris) 3. Do you have a family history of Colon Cancer or Polyps? no 4. Diabetes Mellitus? no 5. Joint replacements in the past 12 months?no 6. Major health problems in the past 3 months?no 7. Any artificial heart valves, MVP, or defibrillator?no    MEDICATIONS & ALLERGIES:    Patient reports the following regarding taking any anticoagulation/antiplatelet therapy:   Plavix, Coumadin, Eliquis, Xarelto, Lovenox, Pradaxa, Brilinta, or Effient? no Aspirin? no  Patient confirms/reports the following medications:  Current Outpatient Medications  Medication Sig Dispense Refill  . amiodarone (PACERONE) 200 MG tablet Take 1 tablet (200 mg total) by mouth every morning. 30 tablet 0  . amLODipine (NORVASC) 5 MG tablet Take 1 tablet by mouth once daily 90 tablet 0  . Ascorbic Acid (VITAMIN C) 100 MG tablet Take 100 mg by mouth daily.    Marland Kitchen aspirin EC 81 MG tablet Take 81 mg by mouth daily.     . baclofen (LIORESAL) 10 MG tablet Take 1 tablet (10 mg total) by mouth at bedtime as needed for muscle spasms. 30 each 3  . CRANBERRY PO Take by mouth.    . ferrous sulfate 325 (65 FE) MG EC tablet Take 1 tablet (325 mg total) by mouth 3 (three) times daily with meals. 270 tablet 1  . hydrALAZINE (APRESOLINE) 50 MG tablet Take 1 tablet by mouth twice daily 180 tablet 0  . lisinopril-hydrochlorothiazide (ZESTORETIC) 20-25 MG tablet Take 1 tablet by mouth twice daily 180 tablet 0  . Na Sulfate-K Sulfate-Mg Sulf 17.5-3.13-1.6 GM/177ML SOLN Take 1 kit by mouth once for 1 dose. 354 mL 0  . traZODone (DESYREL) 50 MG tablet Take 0.5-1 tablets (25-50 mg total) by mouth at bedtime as  needed for sleep. 30 tablet 2  . VITAMIN D PO Take by mouth daily.     No current facility-administered medications for this visit.    Patient confirms/reports the following allergies:  Allergies  Allergen Reactions  . Amoxicillin Other (See Comments)    Joint pains    No orders of the defined types were placed in this encounter.   AUTHORIZATION INFORMATION Primary Insurance: 1D#: Group #:  Secondary Insurance: 1D#: Group #:  SCHEDULE INFORMATION: Date: Tuesday 12/02/20 Time: Location:[facility]

## 2020-11-24 ENCOUNTER — Ambulatory Visit (INDEPENDENT_AMBULATORY_CARE_PROVIDER_SITE_OTHER): Payer: Medicare Other | Admitting: Nurse Practitioner

## 2020-11-24 ENCOUNTER — Other Ambulatory Visit: Payer: Self-pay

## 2020-11-24 ENCOUNTER — Encounter: Payer: Self-pay | Admitting: Nurse Practitioner

## 2020-11-24 VITALS — BP 118/73 | HR 72 | Temp 98.5°F | Wt 154.6 lb

## 2020-11-24 DIAGNOSIS — R8281 Pyuria: Secondary | ICD-10-CM | POA: Diagnosis not present

## 2020-11-24 DIAGNOSIS — R399 Unspecified symptoms and signs involving the genitourinary system: Secondary | ICD-10-CM | POA: Insufficient documentation

## 2020-11-24 LAB — URINALYSIS, ROUTINE W REFLEX MICROSCOPIC
Bilirubin, UA: NEGATIVE
Glucose, UA: NEGATIVE
Ketones, UA: NEGATIVE
Nitrite, UA: NEGATIVE
Protein,UA: NEGATIVE
Specific Gravity, UA: 1.015 (ref 1.005–1.030)
Urobilinogen, Ur: 0.2 mg/dL (ref 0.2–1.0)
pH, UA: 5.5 (ref 5.0–7.5)

## 2020-11-24 LAB — MICROSCOPIC EXAMINATION: WBC, UA: >30 /HPF — AB (ref 0–5)

## 2020-11-24 LAB — WET PREP FOR TRICH, YEAST, CLUE
Clue Cell Exam: NEGATIVE
Trichomonas Exam: NEGATIVE
Yeast Exam: NEGATIVE

## 2020-11-24 MED ORDER — SULFAMETHOXAZOLE-TRIMETHOPRIM 800-160 MG PO TABS: 1.0000 | ORAL_TABLET | Freq: Two times a day (BID) | ORAL | 0 refills | Status: AC

## 2020-11-24 NOTE — Assessment & Plan Note (Signed)
Acute x one week.  Wet prep negative and UA noting trace BLD, LEUK 3+, Many bacteria, >30 WBC, RBC 11-30.  At this time will send in Bactrim DS 1 tablet BID x 3 days to treat.  Urine sent for culture, discussed with patient may need to adjust regimen dependent on culture results.  Will notify her if changes needed.  Continue increased hydration and cranberry juice.  Return for worsening or ongoing.

## 2020-11-24 NOTE — Progress Notes (Signed)
BP 118/73   Pulse 72   Temp 98.5 F (36.9 C) (Oral)   Wt 154 lb 9.6 oz (70.1 kg)   SpO2 97%   BMI 28.28 kg/m    Subjective:    Patient ID: Kristina Henson, female    DOB: October 27, 1955, 65 y.o.   MRN: [id number]  HPI: Kristina Henson is a 65 y.o. female  Chief Complaint  Patient presents with  . Urinary Tract Infection    Patient states she is experiencing urinary frequency, pain in her lower back (kidney area), and states her urine looks cloudy. Patient states it started last week and didn't have transportation last week for an appointment.   URINARY SYMPTOMS Started sometime last week -- going to bathroom a lot and has cloudy urine.  She does endorse an odor from urine.  Last treatment for UTI was several months back. Dysuria: no Urinary frequency: yes Urgency: yes Small volume voids: yes Symptom severity: yes Urinary incontinence: no Foul odor: yes Hematuria: no Abdominal pain: no Back pain: a little bit Suprapubic pain/pressure: no Flank pain: no Fever:  no Vomiting: no Relief with cranberry juice: not helping Relief with pyridium: none taken Status: stable Previous urinary tract infection: yes Recurrent urinary tract infection: no Sexual activity: No sexually active/monogomous/practicing safe sex History of sexually transmitted disease: no Treatments attempted: cranberry and increasing fluids   Relevant past medical, surgical, family and social history reviewed and updated as indicated. Interim medical history since our last visit reviewed. Allergies and medications reviewed and updated.  Review of Systems  Constitutional: Negative for activity change, appetite change, diaphoresis, fatigue and fever.  Respiratory: Negative for cough, chest tightness and shortness of breath.   Cardiovascular: Negative for chest pain, palpitations and leg swelling.  Gastrointestinal: Negative.   Genitourinary: Positive for decreased urine volume, frequency and urgency. Negative  for dysuria, hematuria, pelvic pain, vaginal bleeding, vaginal discharge and vaginal pain.  Neurological: Negative.   Psychiatric/Behavioral: Negative.     Per HPI unless specifically indicated above     Objective:    BP 118/73   Pulse 72   Temp 98.5 F (36.9 C) (Oral)   Wt 154 lb 9.6 oz (70.1 kg)   SpO2 97%   BMI 28.28 kg/m   Wt Readings from Last 3 Encounters:  11/24/20 154 lb 9.6 oz (70.1 kg)  11/07/20 151 lb (68.5 kg)  10/31/20 152 lb 9 oz (69.2 kg)    Physical Exam Vitals and nursing note reviewed.  Constitutional:      General: She is awake. She is not in acute distress.    Appearance: She is well-developed, well-groomed and overweight. She is not ill-appearing.  HENT:     Head: Normocephalic.     Right Ear: Hearing normal.     Left Ear: Hearing normal.  Eyes:     General: Lids are normal.        Right eye: No discharge.        Left eye: No discharge.     Conjunctiva/sclera: Conjunctivae normal.     Pupils: Pupils are equal, round, and reactive to light.  Neck:     Vascular: No carotid bruit.  Cardiovascular:     Rate and Rhythm: Normal rate and regular rhythm.     Heart sounds: Normal heart sounds. No murmur heard. No gallop.   Pulmonary:     Effort: Pulmonary effort is normal. No accessory muscle usage or respiratory distress.     Breath sounds: Normal breath sounds.  Abdominal:     General: Bowel sounds are normal.     Palpations: Abdomen is soft. There is no hepatomegaly.     Tenderness: There is no abdominal tenderness. There is no right CVA tenderness or left CVA tenderness.  Musculoskeletal:     Cervical back: Normal range of motion and neck supple.     Right lower leg: No edema.     Left lower leg: No edema.  Skin:    General: Skin is warm and dry.  Neurological:     Mental Status: She is alert and oriented to person, place, and time.  Psychiatric:        Attention and Perception: Attention normal.        Mood and Affect: Mood normal.         Speech: Speech normal.        Behavior: Behavior normal. Behavior is cooperative.        Thought Content: Thought content normal.        Judgment: Judgment normal.    Results for orders placed or performed in visit on 11/24/20  WET PREP FOR [place], YEAST, CLUE   Specimen: Sterile Swab   Sterile Swab  Result Value Ref Range   Trichomonas Exam Negative Negative   Yeast Exam Negative Negative   Clue Cell Exam Negative Negative  Microscopic Examination   Urine  Result Value Ref Range   WBC, UA >30 (A) 0 - 5 /hpf   RBC 11-30 (A) 0 - 2 /hpf   Epithelial Cells (non renal) 0-10 0 - 10 /hpf   Bacteria, UA Many (A) None seen/Few  Urinalysis, Routine w reflex microscopic  Result Value Ref Range   Specific Gravity, UA 1.015 1.005 - 1.030   pH, UA 5.5 5.0 - 7.5   Color, UA Yellow Yellow   Appearance Ur Cloudy (A) Clear   Leukocytes,UA 3+ (A) Negative   Protein,UA Negative Negative/Trace   Glucose, UA Negative Negative   Ketones, UA Negative Negative   RBC, UA Trace (A) Negative   Bilirubin, UA Negative Negative   Urobilinogen, Ur 0.2 0.2 - 1.0 mg/dL   Nitrite, UA Negative Negative   Microscopic Examination See below:       Assessment & Plan:   Problem List Items Addressed This Visit      Other   Urinary tract infection symptoms - Primary    Acute x one week.  Wet prep negative and UA noting trace BLD, LEUK 3+, Many bacteria, >30 WBC, RBC 11-30.  At this time will send in Bactrim DS 1 tablet BID x 3 days to treat.  Urine sent for culture, discussed with patient may need to adjust regimen dependent on culture results.  Will notify her if changes needed.  Continue increased hydration and cranberry juice.  Return for worsening or ongoing.        Relevant Orders   Urinalysis, Routine w reflex microscopic (Completed)   WET PREP FOR [place], YEAST, CLUE (Completed)    Other Visit Diagnoses    Pyuria       Urine for culture.   Relevant Orders   Urine Culture       Follow up  plan: Return if symptoms worsen or fail to improve.

## 2020-11-24 NOTE — Patient Instructions (Signed)
Urinary Tract Infection, Adult A urinary tract infection (UTI) is an infection of any part of the urinary tract. The urinary tract includes:  The kidneys.  The ureters.  The bladder.  The urethra. These organs make, store, and get rid of pee (urine) in the body. What are the causes? This infection is caused by germs (bacteria) in your genital area. These germs grow and cause swelling (inflammation) of your urinary tract. What increases the risk? The following factors may make you more likely to develop this condition:  Using a small, thin tube (catheter) to drain pee.  Not being able to control when you pee or poop (incontinence).  Being female. If you are female, these things can increase the risk: ? Using these methods to prevent pregnancy:  A medicine that kills sperm (spermicide).  A device that blocks sperm (diaphragm). ? Having low levels of a female hormone (estrogen). ? Being pregnant. You are more likely to develop this condition if:  You have genes that add to your risk.  You are sexually active.  You take antibiotic medicines.  You have trouble peeing because of: ? A prostate that is bigger than normal, if you are female. ? A blockage in the part of your body that drains pee from the bladder. ? A kidney stone. ? A nerve condition that affects your bladder. ? Not getting enough to drink. ? Not peeing often enough.  You have other conditions, such as: ? Diabetes. ? A weak disease-fighting system (immune system). ? Sickle cell disease. ? Gout. ? Injury of the spine. What are the signs or symptoms? Symptoms of this condition include:  Needing to pee right away.  Peeing small amounts often.  Pain or burning when peeing.  Blood in the pee.  Pee that smells bad or not like normal.  Trouble peeing.  Pee that is cloudy.  Fluid coming from the vagina, if you are female.  Pain in the belly or lower back. Other symptoms include:  Vomiting.  Not  feeling hungry.  Feeling mixed up (confused). This may be the first symptom in older adults.  Being tired and grouchy (irritable).  A fever.  Watery poop (diarrhea). How is this treated?  Taking antibiotic medicine.  Taking other medicines.  Drinking enough water. In some cases, you may need to see a specialist. Follow these instructions at home: Medicines  Take over-the-counter and prescription medicines only as told by your doctor.  If you were prescribed an antibiotic medicine, take it as told by your doctor. Do not stop taking it even if you start to feel better. General instructions  Make sure you: ? Pee until your bladder is empty. ? Do not hold pee for a long time. ? Empty your bladder after sex. ? Wipe from front to back after peeing or pooping if you are a female. Use each tissue one time when you wipe.  Drink enough fluid to keep your pee pale yellow.  Keep all follow-up visits.   Contact a doctor if:  You do not get better after 1-2 days.  Your symptoms go away and then come back. Get help right away if:  You have very bad back pain.  You have very bad pain in your lower belly.  You have a fever.  You have chills.  You feeling like you will vomit or you vomit. Summary  A urinary tract infection (UTI) is an infection of any part of the urinary tract.  This condition is caused by   germs in your genital area.  There are many risk factors for a UTI.  Treatment includes antibiotic medicines.  Drink enough fluid to keep your pee pale yellow. This information is not intended to replace advice given to you by your health care provider. Make sure you discuss any questions you have with your health care provider. Document Revised: 04/25/2020 Document Reviewed: 04/25/2020 Elsevier Patient Education  2021 Elsevier Inc.  

## 2020-11-25 ENCOUNTER — Telehealth: Payer: Self-pay | Admitting: Gastroenterology

## 2020-11-25 NOTE — Telephone Encounter (Signed)
Patient has questions about medication she is taking for UTI.  She wanted to make sure she will be ok by her procedure time if she takes medication as directed.

## 2020-11-25 NOTE — Telephone Encounter (Signed)
Pt advised to continue taking antibiotic for UTI. This will not effect her colonoscopy scheduled for 12/02/20.

## 2020-11-26 LAB — URINE CULTURE

## 2020-11-26 NOTE — Progress Notes (Signed)
Please let Kristina Henson know her urine did return showing an infection present and the antibiotic sent should work well for this, continue taking this until complete and if ongoing symptoms then return to office.  Also ensure increasing fluid intake.  Any questions?  Have a great day!!

## 2020-11-28 ENCOUNTER — Other Ambulatory Visit: Payer: Self-pay

## 2020-11-28 ENCOUNTER — Ambulatory Visit: Payer: Self-pay

## 2020-11-28 ENCOUNTER — Other Ambulatory Visit
Admission: RE | Admit: 2020-11-28 | Discharge: 2020-11-28 | Disposition: A | Discharge status: Home / Self Care | Payer: Medicare Other | Source: Ambulatory Visit | Attending: Gastroenterology | Admitting: Gastroenterology

## 2020-11-28 DIAGNOSIS — Z01812 Encounter for preprocedural laboratory examination: Secondary | ICD-10-CM | POA: Insufficient documentation

## 2020-11-28 DIAGNOSIS — Z20822 Contact with and (suspected) exposure to covid-19: Secondary | ICD-10-CM | POA: Insufficient documentation

## 2020-11-28 LAB — SARS CORONAVIRUS 2 (TAT 6-24 HRS): SARS Coronavirus 2: NEGATIVE

## 2020-11-28 NOTE — Telephone Encounter (Signed)
Patient informed to Dr. Durenda Age recommendations and verbalized understanding.

## 2020-11-28 NOTE — Telephone Encounter (Signed)
She can stop her aspirin now and restart it 3 days after the colonoscopy- everything else she needs to check with the GI doctor.

## 2020-11-28 NOTE — Telephone Encounter (Signed)
Does Lamis need to stop any medications before colonoscopy or does she need to reach out to GI?

## 2020-11-28 NOTE — Telephone Encounter (Signed)
   [place] Boot Female, 65 y.o., [birth year]  MRN:  [id number] Phone:  [phone] Jerilynn Mages)       PCP:  Valerie Roys, DO Primary Cvg:  Medicare/Medicare Part A And B  Next Appt With Family Medicine 12/16/2020 at 10:40 AM         Message from [place] sent at 11/28/2020 12:27 PM EST  Summary: Med advice   Pt had questions regarding her medications, Pt was not sure if she needs to stop taking her medications because she has a colonoscopy coming up.          Call History   Type Contact Phone/Fax User  11/28/2020 12:25 PM EST Phone (Incoming) Kristell, Wooding (Self) [phone] (H) Pawlus, Monica A   Pt. Having a colonoscopy 12/02/20. Confused about what medicines to hold the morning of procedure. States the information says to hold "fluid pills and blood pressure medicine and bring it with me when I go." States she called the number on her instructions but "they have gone for the day." Wants Dr. Wynetta Emery to clarify this for her.Please advise.  Answer Assessment - Initial Assessment Questions 1. NAME of MEDICATION: "What medicine are you calling about?"     All my medicines 2. QUESTION: "What is your question?" (e.g., medication refill, side effect)     Should I hold all my medicines before my colonoscopy 3. PRESCRIBING HCP: "Who prescribed it?" Reason: if prescribed by specialist, call should be referred to that group.     Dr. Wynetta Emery 4. SYMPTOMS: "Do you have any symptoms?"     No 5. SEVERITY: If symptoms are present, ask "Are they mild, moderate or severe?"     n/a 6. PREGNANCY:  "Is there any chance that you are pregnant?" "When was your last menstrual period?"     No  Protocols used: MEDICATION QUESTION CALL-A-AH

## 2020-12-02 ENCOUNTER — Encounter
Admission: RE | Disposition: A | Discharge status: Home / Self Care | Payer: Self-pay | Source: Home / Self Care | Attending: Gastroenterology

## 2020-12-02 ENCOUNTER — Encounter: Payer: Self-pay | Admitting: Gastroenterology

## 2020-12-02 ENCOUNTER — Other Ambulatory Visit: Payer: Self-pay

## 2020-12-02 ENCOUNTER — Ambulatory Visit: Payer: Medicare Other | Admitting: Certified Registered"

## 2020-12-02 ENCOUNTER — Ambulatory Visit
Admission: RE | Admit: 2020-12-02 | Discharge: 2020-12-02 | Disposition: A | Discharge status: Home / Self Care | Payer: Medicare Other | Attending: Gastroenterology | Admitting: Gastroenterology

## 2020-12-02 DIAGNOSIS — K621 Rectal polyp: Secondary | ICD-10-CM | POA: Insufficient documentation

## 2020-12-02 DIAGNOSIS — Z880 Allergy status to penicillin: Secondary | ICD-10-CM | POA: Diagnosis not present

## 2020-12-02 DIAGNOSIS — Z7982 Long term (current) use of aspirin: Secondary | ICD-10-CM | POA: Insufficient documentation

## 2020-12-02 DIAGNOSIS — Z79899 Other long term (current) drug therapy: Secondary | ICD-10-CM | POA: Insufficient documentation

## 2020-12-02 DIAGNOSIS — D123 Benign neoplasm of transverse colon: Secondary | ICD-10-CM | POA: Insufficient documentation

## 2020-12-02 DIAGNOSIS — Z860100 Personal history of colon polyps, unspecified: Secondary | ICD-10-CM

## 2020-12-02 DIAGNOSIS — Z1211 Encounter for screening for malignant neoplasm of colon: Secondary | ICD-10-CM | POA: Diagnosis not present

## 2020-12-02 DIAGNOSIS — Z87891 Personal history of nicotine dependence: Secondary | ICD-10-CM | POA: Insufficient documentation

## 2020-12-02 DIAGNOSIS — K635 Polyp of colon: Secondary | ICD-10-CM | POA: Diagnosis not present

## 2020-12-02 DIAGNOSIS — D500 Iron deficiency anemia secondary to blood loss (chronic): Secondary | ICD-10-CM | POA: Diagnosis not present

## 2020-12-02 DIAGNOSIS — I4891 Unspecified atrial fibrillation: Secondary | ICD-10-CM | POA: Diagnosis not present

## 2020-12-02 DIAGNOSIS — D126 Benign neoplasm of colon, unspecified: Secondary | ICD-10-CM | POA: Diagnosis not present

## 2020-12-02 DIAGNOSIS — K641 Second degree hemorrhoids: Secondary | ICD-10-CM | POA: Insufficient documentation

## 2020-12-02 DIAGNOSIS — E785 Hyperlipidemia, unspecified: Secondary | ICD-10-CM | POA: Diagnosis not present

## 2020-12-02 HISTORY — PX: COLONOSCOPY WITH PROPOFOL: SHX5780

## 2020-12-02 SURGERY — COLONOSCOPY WITH PROPOFOL
Anesthesia: General

## 2020-12-02 MED ORDER — MIDAZOLAM HCL 2 MG/2ML IJ SOLN
INTRAMUSCULAR | Status: AC
  Filled 2020-12-02: qty 2

## 2020-12-02 MED ORDER — LIDOCAINE 2% (20 MG/ML) 5 ML SYRINGE
INTRAMUSCULAR | Status: DC | PRN
  Administered 2020-12-02: 25 mg via INTRAVENOUS

## 2020-12-02 MED ORDER — PROPOFOL 500 MG/50ML IV EMUL
INTRAVENOUS | Status: DC | PRN
  Administered 2020-12-02: 120 ug/kg/min via INTRAVENOUS

## 2020-12-02 MED ORDER — PROPOFOL 10 MG/ML IV BOLUS
INTRAVENOUS | Status: DC | PRN
  Administered 2020-12-02: 30 mg via INTRAVENOUS
  Administered 2020-12-02: 70 mg via INTRAVENOUS

## 2020-12-02 MED ORDER — SODIUM CHLORIDE 0.9 % IV SOLN: INTRAVENOUS | Status: DC

## 2020-12-02 MED ORDER — MIDAZOLAM HCL 5 MG/5ML IJ SOLN
INTRAMUSCULAR | Status: DC | PRN
  Administered 2020-12-02: 2 mg via INTRAVENOUS

## 2020-12-02 NOTE — Anesthesia Postprocedure Evaluation (Signed)
Anesthesia Post Note  Patient: Kristina Henson  Procedure(s) Performed: COLONOSCOPY WITH PROPOFOL (N/A )  Patient location during evaluation: Endoscopy Anesthesia Type: General Level of consciousness: awake and alert Pain management: pain level controlled Vital Signs Assessment: post-procedure vital signs reviewed and stable Respiratory status: spontaneous breathing and respiratory function stable Cardiovascular status: stable Anesthetic complications: no   No complications documented.   Last Vitals:  Vitals:   12/02/20 0910 12/02/20 0946  BP: 138/85 (!) 105/50  Pulse: 69 65  Resp: 20 (!) 23  Temp: (!) 35.9 C (!) 35.9 C  SpO2: 100% 99%    Last Pain:  Vitals:   12/02/20 0946  TempSrc: Temporal  PainSc: Asleep                 Brandell Maready K

## 2020-12-02 NOTE — Op Note (Signed)
[facility] Gastroenterology Patient Name: Kristina Henson Procedure Date: 12/02/2020 9:28 AM MRN: [id number] Account #: [id number] Date of Birth: 1956/07/12 Admit Type: Outpatient Age: 65 Room: [facility] ENDO ROOM 4 Gender: Female Note Status: Finalized Procedure:             Colonoscopy Indications:           High risk colon cancer surveillance: Personal history                         of colonic polyps Providers:             Lucilla Lame MD, MD Medicines:             Propofol per Anesthesia Complications:         No immediate complications. Procedure:             Pre-Anesthesia Assessment:                        - Prior to the procedure, a History and Physical was                         performed, and patient medications and allergies were                         reviewed. The patient's tolerance of previous                         anesthesia was also reviewed. The risks and benefits                         of the procedure and the sedation options and risks                         were discussed with the patient. All questions were                         answered, and informed consent was obtained. Prior                         Anticoagulants: The patient has taken no previous                         anticoagulant or antiplatelet agents. ASA Grade                         Assessment: II - A patient with mild systemic disease.                         After reviewing the risks and benefits, the patient                         was deemed in satisfactory condition to undergo the                         procedure.                        After obtaining informed consent, the colonoscope was  passed under direct vision. Throughout the procedure,                         the patient's blood pressure, pulse, and oxygen                         saturations were monitored continuously. The                         Colonoscope was introduced through the anus  and                         advanced to the the cecum, identified by appendiceal                         orifice and ileocecal valve. The colonoscopy was                         performed without difficulty. The patient tolerated                         the procedure well. The quality of the bowel                         preparation was excellent. Findings:      The perianal and digital rectal examinations were normal.      A 3 mm polyp was found in the transverse colon. The polyp was sessile.       The polyp was removed with a cold biopsy forceps. Resection and       retrieval were complete.      Two sessile polyps were found in the rectum. The polyps were 1 to 2 mm       in size. These polyps were removed with a cold biopsy forceps. Resection       and retrieval were complete.      Non-bleeding internal hemorrhoids were found during retroflexion. The       hemorrhoids were Grade II (internal hemorrhoids that prolapse but reduce       spontaneously). Impression:            - One 3 mm polyp in the transverse colon, removed with                         a cold biopsy forceps. Resected and retrieved.                        - Two 1 to 2 mm polyps in the rectum, removed with a                         cold biopsy forceps. Resected and retrieved.                        - Non-bleeding internal hemorrhoids. Recommendation:        - Discharge patient to home.                        - Resume previous diet.                        -  Continue present medications.                        - Await pathology results.                        - Repeat colonoscopy in 7 years for surveillance. Procedure Code(s):     --- Professional ---                        [phone], Colonoscopy, flexible; with biopsy, single or                         multiple Diagnosis Code(s):     --- Professional ---                        Z86.010, Personal history of colonic polyps                        K63.5, Polyp of colon CPT  copyright 2019 American Medical Association. All rights reserved. The codes documented in this report are preliminary and upon coder review may  be revised to meet current compliance requirements. Lucilla Lame MD, MD 12/02/2020 9:45:14 AM This report has been signed electronically. Number of Addenda: 0 Note Initiated On: 12/02/2020 9:28 AM Scope Withdrawal Time: 0 hours 7 minutes 22 seconds  Total Procedure Duration: 0 hours 9 minutes 41 seconds  Estimated Blood Loss:  Estimated blood loss: none.      [facility]

## 2020-12-02 NOTE — H&P (Signed)
Lucilla Lame, MD [address] [place], [place] [address] [phone] Fax : [phone]  Primary Care Physician:  Valerie Roys, DO Primary Gastroenterologist:  Dr. Allen Norris  Pre-Procedure History & Physical: HPI:  Kristina Henson is a 65 y.o. female is here for an colonoscopy.   Past Medical History:  Diagnosis Date  . Anemia   . Arthritis    BACK-LUMBAR  . Atrial fibrillation ([place])   . Coronary artery disease   . Duodenitis   . Dysrhythmia   . Gastritis   . Hypertension   . Mass in neck    lt side  . Pneumonia 2016  . Stroke ([place]) 2012  . Stroke [facility]) 2012    Past Surgical History:  Procedure Laterality Date  . ABDOMINAL HYSTERECTOMY     Total  . cardiac catherization    . COLONOSCOPY WITH PROPOFOL N/A 11/18/2015   Procedure: COLONOSCOPY WITH PROPOFOL;  Surgeon: Lucilla Lame, MD;  Location: [facility] ENDOSCOPY;  Service: Endoscopy;  Laterality: N/A;  . CORONARY ANGIOPLASTY    . ESOPHAGOGASTRODUODENOSCOPY (EGD) WITH PROPOFOL N/A 04/23/2016   Procedure: ESOPHAGOGASTRODUODENOSCOPY (EGD) WITH PROPOFOL;  Surgeon: Lucilla Lame, MD;  Location: [facility];  Service: Endoscopy;  Laterality: N/A;  small bowel bx gastric antrum bx  . SUBMANDIBULAR GLAND EXCISION Left 09/08/2017   Procedure: EXCISION SUBMANDIBULAR GLAND;  Surgeon: Carloyn Manner, MD;  Location: [facility] ORS;  Service: ENT;  Laterality: Left;  . throat biopsy      Prior to Admission medications   Medication Sig Start Date End Date Taking? Authorizing Provider  amiodarone (PACERONE) 200 MG tablet Take 1 tablet (200 mg total) by mouth every morning. 04/03/18   Park Liter P, DO  amLODipine (NORVASC) 5 MG tablet Take 1 tablet by mouth once daily 10/29/20   Kathrine Haddock, NP  Ascorbic Acid (VITAMIN C) 100 MG tablet Take 100 mg by mouth daily.    [provider]  aspirin EC 81 MG tablet Take 81 mg by mouth daily.     [provider]  baclofen (LIORESAL) 10 MG tablet Take 1  tablet (10 mg total) by mouth at bedtime as needed for muscle spasms. 10/02/20   Johnson, Megan P, DO  CRANBERRY PO Take by mouth.    [provider]  ferrous sulfate 325 (65 FE) MG EC tablet Take 1 tablet (325 mg total) by mouth 3 (three) times daily with meals. 11/02/19   Park Liter P, DO  hydrALAZINE (APRESOLINE) 50 MG tablet Take 1 tablet by mouth twice daily 10/29/20   Kathrine Haddock, NP  lisinopril-hydrochlorothiazide (ZESTORETIC) 20-25 MG tablet Take 1 tablet by mouth twice daily 10/29/20   Kathrine Haddock, NP  traZODone (DESYREL) 50 MG tablet Take 0.5-1 tablets (25-50 mg total) by mouth at bedtime as needed for sleep. 07/30/20   Johnson, Megan P, DO  VITAMIN D PO Take by mouth daily.    [provider]    Allergies as of 11/14/2020 - Review Complete 11/07/2020  Allergen Reaction Noted  . Amoxicillin Other (See Comments) 01/30/2016    Family History  Problem Relation Age of Onset  . Arthritis Mother   . Cancer Mother   . Hypertension Sister   . Asthma Brother   . Dementia Brother   . Breast cancer Neg Hx   . Kidney cancer Neg Hx   . Bladder Cancer Neg Hx     Social History   Socioeconomic History  . Marital status: Divorced    Spouse name:  Not on file  . Number of children: Not on file  . Years of education: 36  . Highest education level: 12th grade  Occupational History  . Occupation: disability   Tobacco Use  . Smoking status: Former Smoker    Quit date: 1978    Years since quitting: 44.2  . Smokeless tobacco: Never Used  . Tobacco comment: didnt smoke much   Vaping Use  . Vaping Use: Never used  Substance and Sexual Activity  . Alcohol use: Yes    Alcohol/week: 1.0 standard drink    Types: 1 Glasses of wine per week    Comment: occasionally  . Drug use: No  . Sexual activity: Yes  Other Topics Concern  . Not on file  Social History Narrative  . Not on file   Social Determinants of Health   Financial Resource Strain: Low Risk   .  Difficulty of Paying Living Expenses: Not hard at all  Food Insecurity: No Food Insecurity  . Worried About Charity fundraiser in the Last Year: Never true  . Ran Out of Food in the Last Year: Never true  Transportation Needs: No Transportation Needs  . Lack of Transportation (Medical): No  . Lack of Transportation (Non-Medical): No  Physical Activity: Inactive  . Days of Exercise per Week: 0 days  . Minutes of Exercise per Session: 0 min  Stress: No Stress Concern Present  . Feeling of Stress : Not at all  Social Connections: Not on file  Intimate Partner Violence: Not on file    Review of Systems: See HPI, otherwise negative ROS  Physical Exam: BP 138/85   Pulse 69   Temp (!) 96.6 F (35.9 C) (Temporal)   Resp 20   Ht 5\' 2"  (1.575 m)   Wt 68.5 kg   SpO2 100%   BMI 27.62 kg/m  General:   Alert,  pleasant and cooperative in NAD Head:  Normocephalic and atraumatic. Neck:  Supple; no masses or thyromegaly. Lungs:  Clear throughout to auscultation.    Heart:  Regular rate and rhythm. Abdomen:  Soft, nontender and nondistended. Normal bowel sounds, without guarding, and without rebound.   Neurologic:  Alert and  oriented x4;  grossly normal neurologically.  Impression/Plan: Kristina Henson is here for an colonoscopy to be performed for a history of adenomatous polyps on 10/2015   Risks, benefits, limitations, and alternatives regarding  colonoscopy have been reviewed with the patient.  Questions have been answered.  All parties agreeable.   Lucilla Lame, MD  12/02/2020, 9:13 AM

## 2020-12-02 NOTE — Anesthesia Preprocedure Evaluation (Signed)
Anesthesia Evaluation  Patient identified by MRN, date of birth, ID band Patient awake    Reviewed: Allergy & Precautions, NPO status , Patient's Chart, lab work & pertinent test results  History of Anesthesia Complications Negative for: history of anesthetic complications  Airway Mallampati: II       Dental  (+) Missing, Poor Dentition, Chipped   Pulmonary neg sleep apnea, neg COPD, Not current smoker, former smoker,           Cardiovascular hypertension, Pt. on medications (-) Past MI and (-) CHF + dysrhythmias (afib, controlled on meds) Atrial Fibrillation (-) Valvular Problems/Murmurs     Neuro/Psych neg Seizures CVA (speech difficulties, resolved)    GI/Hepatic Neg liver ROS, neg GERD  ,  Endo/Other  neg diabetes  Renal/GU Renal InsufficiencyRenal disease     Musculoskeletal   Abdominal   Peds  Hematology   Anesthesia Other Findings   Reproductive/Obstetrics                             Anesthesia Physical Anesthesia Plan  ASA: III  Anesthesia Plan: General   Post-op Pain Management:    Induction: Intravenous  PONV Risk Score and Plan: 3 and Propofol infusion and TIVA  Airway Management Planned: Nasal Cannula  Additional Equipment:   Intra-op Plan:   Post-operative Plan:   Informed Consent: I have reviewed the patients History and Physical, chart, labs and discussed the procedure including the risks, benefits and alternatives for the proposed anesthesia with the patient or authorized representative who has indicated his/her understanding and acceptance.       Plan Discussed with:   Anesthesia Plan Comments:         Anesthesia Quick Evaluation

## 2020-12-02 NOTE — Transfer of Care (Signed)
Immediate Anesthesia Transfer of Care Note  Patient: Kristina Henson  Procedure(s) Performed: COLONOSCOPY WITH PROPOFOL (N/A )  Patient Location: Endoscopy Unit  Anesthesia Type:General  Level of Consciousness: sedated  Airway & Oxygen Therapy: Patient Spontanous Breathing  Post-op Assessment: Report given to RN and Post -op Vital signs reviewed and stable  Post vital signs: Reviewed  Last Vitals:  Vitals Value Taken Time  BP 105/50 12/02/20 0946  Temp 35.9 C 12/02/20 0946  Pulse 64 12/02/20 0946  Resp 23 12/02/20 0946  SpO2 99 % 12/02/20 0946  Vitals shown include unvalidated device data.  Last Pain:  Vitals:   12/02/20 0946  TempSrc: Temporal  PainSc: Asleep         Complications: No complications documented.

## 2020-12-03 ENCOUNTER — Encounter: Payer: Self-pay | Admitting: Gastroenterology

## 2020-12-03 LAB — SURGICAL PATHOLOGY

## 2020-12-11 ENCOUNTER — Encounter: Payer: Self-pay | Admitting: Nurse Practitioner

## 2020-12-11 ENCOUNTER — Ambulatory Visit (INDEPENDENT_AMBULATORY_CARE_PROVIDER_SITE_OTHER): Payer: Medicare Other | Admitting: Nurse Practitioner

## 2020-12-11 ENCOUNTER — Ambulatory Visit: Payer: Self-pay | Admitting: *Deleted

## 2020-12-11 DIAGNOSIS — R197 Diarrhea, unspecified: Secondary | ICD-10-CM | POA: Diagnosis not present

## 2020-12-11 NOTE — Progress Notes (Signed)
There were no vitals taken for this visit.   Subjective:    Patient ID: Kristina Henson, female    DOB: 28-Dec-1955, 65 y.o.   MRN: [id number]  HPI: Kristina Henson is a 65 y.o. female  Chief Complaint  Patient presents with  . Abdominal Pain    Patient states she is having abdominal pain and diarrhea. Patient states symptoms began 3 days ago and she felt better yesterday but symptoms began today again.    ABDOMINAL ISSUES  Monday patient felt like she was getting a stomach virus which lasted into Tuesday.  She was having vomiting and diarrhea on Monday and Tuesday she just didn't feel well.  Wednesday patient felt better. Today patient started having diarrhea today.  Duration: days Petra Kuba: aptient states it just starts hurting and she has to get to the bathroom quickly. Location: periumbilical  Severity: moderate  Radiation: no Episode duration: Once patient goes to the bathroom pain is improved.  Frequency: intermittent Alleviating factors: Aggravating factors: Treatments attempted: none Constipation: no Diarrhea: yes Episodes of diarrhea/day: 4 Mucous in the stool: no Heartburn: no Bloating:no Flatulence: no Nausea: no Vomiting: yes Episodes of vomit/day: Melena or hematochezia: no Rash: no Jaundice: no Fever: no Weight loss: no    Relevant past medical, surgical, family and social history reviewed and updated as indicated. Interim medical history since our last visit reviewed. Allergies and medications reviewed and updated.  Review of Systems  Constitutional: Negative for fever.  Gastrointestinal: Positive for abdominal pain, diarrhea and vomiting.       Patient states she doesn't have pain in her LLQ.  Pain is in the middle of her stomach near her belly button.    Per HPI unless specifically indicated above     Objective:    There were no vitals taken for this visit.  Wt Readings from Last 3 Encounters:  12/02/20 151 lb (68.5 kg)  11/24/20 154 lb 9.6  oz (70.1 kg)  11/07/20 151 lb (68.5 kg)    Physical Exam Vitals and nursing note reviewed.  Pulmonary:     Effort: Pulmonary effort is normal. No respiratory distress.  Neurological:     Mental Status: She is alert.  Psychiatric:        Mood and Affect: Mood normal.        Behavior: Behavior normal.        Thought Content: Thought content normal.        Judgment: Judgment normal.    Unable to evaluate patient due to phone call.   Assessment & Plan:   Problem List Items Addressed This Visit   None   Visit Diagnoses    Diarrhea, unspecified type    -  Primary   Reviewed s/s to monitor for and when to seek higher level of care. Recommend staying well hydrated and eating a bland diet. RTC is symptoms worsen.     Requested patient come in and be evaluated tomorrow.  She was unable to come in and be evaluated due to transportation.  Requested patient to call the office tomorrow to let us know how she is feeling.  Patient states she will let us know.    Follow up plan: Return if symptoms worsen or fail to improve.    This visit was completed via MyChart due to the restrictions of the COVID-19 pandemic. All issues as above were discussed and addressed. Physical exam was done as above through visual confirmation on MyChart. If it was felt that  the patient should be evaluated in the office, they were directed there. The patient verbally consented to this visit. 1. Location of the patient: Home 2. Location of the provider: Office 3. Those involved with this call:  ? Provider: Jon Billings, NP ? CMA: Lowry Ram, CMA ? Front Desk/Registration: Jill Side 4. Time spent on call: 13 minutes with patient via phone conference. More than 50% of this time was spent in counseling and coordination of care. 20 minutes total spent in review of patient's record and preparation of their chart.

## 2020-12-11 NOTE — Telephone Encounter (Signed)
FYI scheduled telephone visit today at 3

## 2020-12-11 NOTE — Telephone Encounter (Signed)
C/o diarrhea and abdominal pain since Monday 12/08/20. Vomiting reported on Monday but not since Monday. Felt better on Wednesday and today  Dull abdominal pain and diarrhea returned. Diarrhea 4-5 times today. Treated with antibiotics greater than a month ago per patient for UTI. Taking pepto bismol with little relief. Denies fever, dizziness, weakness, or blood in stool or signs of dehydration. Patient reports she has been drinking gingerale and gatorade and continues to have diarrhea after drinking fluids. No available virtual visits until April. Patient requesting if she needs medication to help with diarrhea. Please advise. Care advise given. Patient verbalized understanding of care advise and to call back or go to ED if symptoms worsen.    Reason for Disposition . [1] MODERATE diarrhea (e.g., 4-6 times / day more than normal) AND [2] present > 48 hours (2 days)  Answer Assessment - Initial Assessment Questions 1. DIARRHEA SEVERITY: "How bad is the diarrhea?" "How many extra stools have you had in the past 24 hours than normal?"    - NO DIARRHEA (SCALE 0)   - MILD (SCALE 1-3): Few loose or mushy BMs; increase of 1-3 stools over normal daily number of stools; mild increase in ostomy output.   -  MODERATE (SCALE 4-7): Increase of 4-6 stools daily over normal; moderate increase in ostomy output.  SEVERE (SCALE 8-10; OR 'WORST POSSIBLE'): Increase of 7 or more stools daily over normal; moderate increase in ostomy output; incontinence.     moderate 2. ONSET: "When did the diarrhea begin?"      Monday 12/08/20 3. BM CONSISTENCY: "How loose or watery is the diarrhea?"      Watery  4. VOMITING: "Are you also vomiting?" If Yes, ask: "How many times in the past 24 hours?"      Vomiting  On Monday  5. ABDOMINAL PAIN: "Are you having any abdominal pain?" If Yes, ask: "What does it feel like?" (e.g., crampy, dull, intermittent, constant)      Dull  6. ABDOMINAL PAIN SEVERITY: If present, ask: "How bad is  the pain?"  (e.g., Scale 1-10; mild, moderate, or severe)   - MILD (1-3): doesn't interfere with normal activities, abdomen soft and not tender to touch    - MODERATE (4-7): interferes with normal activities or awakens from sleep, tender to touch    - SEVERE (8-10): excruciating pain, doubled over, unable to do any normal activities       Moderate  7. ORAL INTAKE: If vomiting, "Have you been able to drink liquids?" "How much fluids have you had in the past 24 hours?"     Able to drink some liquids like gingerale and gatorade. Does have diarrhea after drinking. 8. HYDRATION: "Any signs of dehydration?" (e.g., dry mouth [not just dry lips], too weak to stand, dizziness, new weight loss) "When did you last urinate?"     denies 9. EXPOSURE: "Have you traveled to a foreign country recently?" "Have you been exposed to anyone with diarrhea?" "Could you have eaten any food that was spoiled?"     na 10. ANTIBIOTIC USE: "Are you taking antibiotics now or have you taken antibiotics in the past 2 months?"       Antibiotics approx over a month for UTI 11. OTHER SYMPTOMS: "Do you have any other symptoms?" (e.g., fever, blood in stool)       no 12. PREGNANCY: "Is there any chance you are pregnant?" "When was your last menstrual period?"       na  Protocols used:  DIARRHEA-A-AH

## 2020-12-16 ENCOUNTER — Encounter: Payer: Self-pay | Admitting: Family Medicine

## 2020-12-16 ENCOUNTER — Other Ambulatory Visit: Payer: Self-pay

## 2020-12-16 ENCOUNTER — Ambulatory Visit (INDEPENDENT_AMBULATORY_CARE_PROVIDER_SITE_OTHER): Payer: Medicare Other | Admitting: Family Medicine

## 2020-12-16 VITALS — BP 123/74 | HR 125 | Temp 98.0°F | Wt 156.8 lb

## 2020-12-16 DIAGNOSIS — D500 Iron deficiency anemia secondary to blood loss (chronic): Secondary | ICD-10-CM | POA: Diagnosis not present

## 2020-12-16 DIAGNOSIS — R197 Diarrhea, unspecified: Secondary | ICD-10-CM | POA: Diagnosis not present

## 2020-12-16 NOTE — Progress Notes (Signed)
BP 123/74   Pulse (!) 125   Temp 98 F (36.7 C)   Wt 156 lb 12.8 oz (71.1 kg)   SpO2 98%   BMI 28.68 kg/m    Subjective:    Patient ID: Kristina Henson, female    DOB: 10/02/1955, 65 y.o.   MRN: [id number]  HPI: Kristina Henson is a 65 y.o. female  Chief Complaint  Patient presents with  . Anemia   ANEMIA Anemia status: better Etiology of anemia: IDA or chronic disease Duration of anemia treatment: chronic Compliance with treatment: excellent compliance Iron supplementation side effects: no Severity of anemia: moderate Fatigue: no Decreased exercise tolerance: no  Dyspnea on exertion: no Palpitations: no Bleeding: no Pica: no   Diarrhea has resolved. Feeling better.   Relevant past medical, surgical, family and social history reviewed and updated as indicated. Interim medical history since our last visit reviewed. Allergies and medications reviewed and updated.  Review of Systems  Constitutional: Negative.   Respiratory: Negative.   Cardiovascular: Negative.   Gastrointestinal: Negative.   Genitourinary: Negative.   Psychiatric/Behavioral: Negative.     Per HPI unless specifically indicated above     Objective:    BP 123/74   Pulse (!) 125   Temp 98 F (36.7 C)   Wt 156 lb 12.8 oz (71.1 kg)   SpO2 98%   BMI 28.68 kg/m   Wt Readings from Last 3 Encounters:  12/16/20 156 lb 12.8 oz (71.1 kg)  12/02/20 151 lb (68.5 kg)  11/24/20 154 lb 9.6 oz (70.1 kg)    Physical Exam Vitals and nursing note reviewed.  Constitutional:      General: She is not in acute distress.    Appearance: Normal appearance. She is not ill-appearing, toxic-appearing or diaphoretic.  HENT:     Head: Normocephalic and atraumatic.     Right Ear: External ear normal.     Left Ear: External ear normal.     Nose: Nose normal.     Mouth/Throat:     Mouth: Mucous membranes are moist.     Pharynx: Oropharynx is clear.  Eyes:     General: No scleral icterus.       Right eye:  No discharge.        Left eye: No discharge.     Extraocular Movements: Extraocular movements intact.     Conjunctiva/sclera: Conjunctivae normal.     Pupils: Pupils are equal, round, and reactive to light.  Cardiovascular:     Rate and Rhythm: Normal rate and regular rhythm.     Pulses: Normal pulses.     Heart sounds: Normal heart sounds. No murmur heard. No friction rub. No gallop.   Pulmonary:     Effort: Pulmonary effort is normal. No respiratory distress.     Breath sounds: Normal breath sounds. No stridor. No wheezing, rhonchi or rales.  Chest:     Chest wall: No tenderness.  Musculoskeletal:        General: Normal range of motion.     Cervical back: Normal range of motion and neck supple.  Skin:    General: Skin is warm and dry.     Capillary Refill: Capillary refill takes less than 2 seconds.     Coloration: Skin is not jaundiced or pale.     Findings: No bruising, erythema, lesion or rash.  Neurological:     General: No focal deficit present.     Mental Status: She is alert and oriented to person,  place, and time. Mental status is at baseline.  Psychiatric:        Mood and Affect: Mood normal.        Behavior: Behavior normal.        Thought Content: Thought content normal.        Judgment: Judgment normal.     Results for orders placed or performed during the hospital encounter of 12/02/20  Surgical pathology  Result Value Ref Range   SURGICAL PATHOLOGY      SURGICAL PATHOLOGY CASE: [id number] PATIENT: [name] Surgical Pathology Report     Specimen Submitted: A. Colon polyp, transverse; cbx B. Rectum polyp x2; cbx  Clinical History: Personal history of colon polyps, Colon polyp      DIAGNOSIS: A. COLON POLYP, TRANSVERSE; COLD BIOPSY: - TUBULAR ADENOMA. - NEGATIVE FOR HIGH-GRADE DYSPLASIA AND MALIGNANCY.  B.  RECTUM POLYP X2; COLD BIOPSY: - HYPERPLASTIC POLYP (2). - NEGATIVE FOR DYSPLASIA AND MALIGNANCY.  GROSS DESCRIPTION: A.  Labeled: cbx polyp transverse colon Received: Formalin Collection time: 9:37 AM on 12/02/2020 Placed into formalin time: 9:37 AM on 12/02/2020 Tissue fragment(s): 1 Size: 0.4 x 0.3 x 0.1 cm Description: Tan soft tissue fragment Entirely submitted in 1 cassette.  B. Labeled: cbx polyp rectum x2 Received: Formalin Collection time: 9:41 AM on 12/02/2020 Placed into formalin time: 9:41 AM on 12/02/2020 Tissue fragment(s): 1 Size: 0.5 x 0.4 x 0.1 cm Description: Red-pink soft tis sue fragment Entirely submitted in 1 cassette.  RB 12/02/2020   Final Diagnosis performed by Quay Burow, MD.   Electronically signed 12/03/2020 10:40:19AM The electronic signature indicates that the named Attending Pathologist has evaluated the specimen Technical component performed at [facility], [address] [place], [place] [address] Lab: [phone] Dir: Rush Farmer, MD, MMM  Professional component performed at [facility], [facility], [place], [place], [place] [address] Lab: [phone] Dir: Dellia Nims. Reuel Derby, MD       Assessment & Plan:   Problem List Items Addressed This Visit      Other   Iron deficiency anemia due to chronic blood loss - Primary    Feeling much better. Will recheck labs today. Continue to follow with hematology as needed. Call with any concerns.       Relevant Orders   CBC with Differential/Platelet    Other Visit Diagnoses    Diarrhea, unspecified type       Resolved. Will check BMP.   Relevant Orders   Basic metabolic panel       Follow up plan: Return May for 6 month follow up.

## 2020-12-16 NOTE — Assessment & Plan Note (Signed)
Feeling much better. Will recheck labs today. Continue to follow with hematology as needed. Call with any concerns.

## 2020-12-17 LAB — CBC WITH DIFFERENTIAL/PLATELET
Basophils Absolute: 0 10*3/uL (ref 0.0–0.2)
Basos: 1 %
EOS (ABSOLUTE): 0 10*3/uL (ref 0.0–0.4)
Eos: 1 %
Hematocrit: 37.6 % (ref 34.0–46.6)
Hemoglobin: 11.8 g/dL (ref 11.1–15.9)
Immature Grans (Abs): 0 10*3/uL (ref 0.0–0.1)
Immature Granulocytes: 1 %
Lymphocytes Absolute: 0.8 10*3/uL (ref 0.7–3.1)
Lymphs: 20 %
MCH: 25.5 pg — ABNORMAL LOW (ref 26.6–33.0)
MCHC: 31.4 g/dL — ABNORMAL LOW (ref 31.5–35.7)
MCV: 81 fL (ref 79–97)
Monocytes Absolute: 0.2 10*3/uL (ref 0.1–0.9)
Monocytes: 6 %
Neutrophils Absolute: 2.9 10*3/uL (ref 1.4–7.0)
Neutrophils: 71 %
Platelets: 152 10*3/uL (ref 150–450)
RBC: 4.63 x10E6/uL (ref 3.77–5.28)
RDW: 19.7 % — ABNORMAL HIGH (ref 11.7–15.4)
WBC: 4 10*3/uL (ref 3.4–10.8)

## 2020-12-17 LAB — BASIC METABOLIC PANEL
BUN/Creatinine Ratio: 23 (ref 12–28)
BUN: 18 mg/dL (ref 8–27)
CO2: 23 mmol/L (ref 20–29)
Calcium: 9.3 mg/dL (ref 8.7–10.3)
Chloride: 101 mmol/L (ref 96–106)
Creatinine, Ser: 0.8 mg/dL (ref 0.57–1.00)
Glucose: 98 mg/dL (ref 65–99)
Potassium: 3.5 mmol/L (ref 3.5–5.2)
Sodium: 141 mmol/L (ref 134–144)
eGFR: 83 mL/min/{1.73_m2} (ref >59)

## 2020-12-22 ENCOUNTER — Encounter: Payer: Self-pay | Admitting: Family Medicine

## 2021-01-15 ENCOUNTER — Ambulatory Visit: Payer: Medicare Other | Admitting: Nurse Practitioner

## 2021-01-16 ENCOUNTER — Encounter: Payer: Self-pay | Admitting: Nurse Practitioner

## 2021-01-16 ENCOUNTER — Ambulatory Visit (INDEPENDENT_AMBULATORY_CARE_PROVIDER_SITE_OTHER): Payer: Medicare Other | Admitting: Nurse Practitioner

## 2021-01-16 ENCOUNTER — Other Ambulatory Visit: Payer: Self-pay

## 2021-01-16 VITALS — BP 131/86 | HR 98 | Temp 97.6°F | Wt 152.0 lb

## 2021-01-16 DIAGNOSIS — R8290 Unspecified abnormal findings in urine: Secondary | ICD-10-CM | POA: Diagnosis not present

## 2021-01-16 DIAGNOSIS — R399 Unspecified symptoms and signs involving the genitourinary system: Secondary | ICD-10-CM | POA: Diagnosis not present

## 2021-01-16 LAB — MICROSCOPIC EXAMINATION: RBC, Urine: NONE SEEN /hpf (ref 0–2)

## 2021-01-16 LAB — URINALYSIS, ROUTINE W REFLEX MICROSCOPIC
Bilirubin, UA: NEGATIVE
Glucose, UA: NEGATIVE
Ketones, UA: NEGATIVE
Nitrite, UA: NEGATIVE
Protein,UA: NEGATIVE
RBC, UA: NEGATIVE
Specific Gravity, UA: 1.015 (ref 1.005–1.030)
Urobilinogen, Ur: 0.2 mg/dL (ref 0.2–1.0)
pH, UA: 5.5 (ref 5.0–7.5)

## 2021-01-16 MED ORDER — NITROFURANTOIN MONOHYD MACRO 100 MG PO CAPS: 100.0000 mg | ORAL_CAPSULE | Freq: Two times a day (BID) | ORAL | 0 refills | Status: DC

## 2021-01-16 NOTE — Progress Notes (Signed)
BP 131/86   Pulse 98   Temp 97.6 F (36.4 C)   Wt 152 lb (68.9 kg)   SpO2 98%   BMI 27.80 kg/m    Subjective:    Patient ID: Kristina Henson, female    DOB: May 09, 1956, 65 y.o.   MRN: [id number]  HPI: Kristina Henson is a 65 y.o. female  Chief Complaint  Patient presents with  . Urinary Tract Infection    Cloudy urine, back pain and white substances in urine Started last week.    URINARY SYMPTOMS Dysuria: no Urinary frequency: yes Urgency: yes Small volume voids: sometimes Symptom severity: no Urinary incontinence: no Foul odor: yes Hematuria: no Abdominal pain: no Back pain: yes Suprapubic pain/pressure: no Flank pain: yes Fever:  no Vomiting: no Relief with cranberry juice: no Relief with pyridium: no Status: worse Previous urinary tract infection: yes Recurrent urinary tract infection: no Sexual activity: No sexually active/monogomous/practicing safe sex History of sexually transmitted disease: no Penile discharge: no Treatments attempted: none   Relevant past medical, surgical, family and social history reviewed and updated as indicated. Interim medical history since our last visit reviewed. Allergies and medications reviewed and updated.  Review of Systems  Constitutional: Negative for fever.  Gastrointestinal: Negative for abdominal pain and vomiting.  Genitourinary: Positive for flank pain, frequency and urgency. Negative for decreased urine volume, dysuria and hematuria.  Musculoskeletal: Positive for back pain.    Per HPI unless specifically indicated above     Objective:    BP 131/86   Pulse 98   Temp 97.6 F (36.4 C)   Wt 152 lb (68.9 kg)   SpO2 98%   BMI 27.80 kg/m   Wt Readings from Last 3 Encounters:  01/16/21 152 lb (68.9 kg)  12/16/20 156 lb 12.8 oz (71.1 kg)  12/02/20 151 lb (68.5 kg)    Physical Exam Vitals and nursing note reviewed.  Constitutional:      General: She is not in acute distress.    Appearance: Normal  appearance. She is normal weight. She is not ill-appearing, toxic-appearing or diaphoretic.  HENT:     Head: Normocephalic.     Right Ear: External ear normal.     Left Ear: External ear normal.     Nose: Nose normal.     Mouth/Throat:     Mouth: Mucous membranes are moist.     Pharynx: Oropharynx is clear.  Eyes:     General:        Right eye: No discharge.        Left eye: No discharge.     Extraocular Movements: Extraocular movements intact.     Conjunctiva/sclera: Conjunctivae normal.     Pupils: Pupils are equal, round, and reactive to light.  Cardiovascular:     Rate and Rhythm: Normal rate and regular rhythm.     Heart sounds: No murmur heard.   Pulmonary:     Effort: Pulmonary effort is normal. No respiratory distress.     Breath sounds: Normal breath sounds. No wheezing or rales.  Abdominal:     General: Abdomen is flat. Bowel sounds are normal. There is no distension.     Palpations: Abdomen is soft.     Tenderness: There is no abdominal tenderness. There is no right CVA tenderness, left CVA tenderness or guarding.  Musculoskeletal:     Cervical back: Normal range of motion and neck supple.  Skin:    General: Skin is warm and dry.  Capillary Refill: Capillary refill takes less than 2 seconds.  Neurological:     General: No focal deficit present.     Mental Status: She is alert and oriented to person, place, and time. Mental status is at baseline.  Psychiatric:        Mood and Affect: Mood normal.        Behavior: Behavior normal.        Thought Content: Thought content normal.        Judgment: Judgment normal.     Results for orders placed or performed in visit on 47/20/72  Basic metabolic panel  Result Value Ref Range   Glucose 98 65 - 99 mg/dL   BUN 18 8 - 27 mg/dL   Creatinine, Ser 0.80 0.57 - 1.00 mg/dL   eGFR 83 >59 mL/min/1.73   BUN/Creatinine Ratio 23 12 - 28   Sodium 141 134 - 144 mmol/L   Potassium 3.5 3.5 - 5.2 mmol/L   Chloride 101 96 -  106 mmol/L   CO2 23 20 - 29 mmol/L   Calcium 9.3 8.7 - 10.3 mg/dL  CBC with Differential/Platelet  Result Value Ref Range   WBC 4.0 3.4 - 10.8 x10E3/uL   RBC 4.63 3.77 - 5.28 x10E6/uL   Hemoglobin 11.8 11.1 - 15.9 g/dL   Hematocrit 37.6 34.0 - 46.6 %   MCV 81 79 - 97 fL   MCH 25.5 (L) 26.6 - 33.0 pg   MCHC 31.4 (L) 31.5 - 35.7 g/dL   RDW 19.7 (H) 11.7 - 15.4 %   Platelets 152 150 - 450 x10E3/uL   Neutrophils 71 Not Estab. %   Lymphs 20 Not Estab. %   Monocytes 6 Not Estab. %   Eos 1 Not Estab. %   Basos 1 Not Estab. %   Neutrophils Absolute 2.9 1.4 - 7.0 x10E3/uL   Lymphocytes Absolute 0.8 0.7 - 3.1 x10E3/uL   Monocytes Absolute 0.2 0.1 - 0.9 x10E3/uL   EOS (ABSOLUTE) 0.0 0.0 - 0.4 x10E3/uL   Basophils Absolute 0.0 0.0 - 0.2 x10E3/uL   Immature Granulocytes 1 Not Estab. %   Immature Grans (Abs) 0.0 0.0 - 0.1 x10E3/uL      Assessment & Plan:   Problem List Items Addressed This Visit      Other   Urinary tract infection symptoms - Primary    Other Visit Diagnoses    Cloudy urine       Relevant Orders   Urinalysis, Routine w reflex microscopic   Urine Culture       Follow up plan: Return if symptoms worsen or fail to improve.  A total of 20 minutes were spent on this encounter today.  When total time is documented, this includes both the face-to-face and non-face-to-face time personally spent before, during and after the visit on the date of the encounter.

## 2021-01-19 NOTE — Progress Notes (Signed)
Results discussed with patient in office. Urine was sent for culture.

## 2021-01-21 LAB — URINE CULTURE

## 2021-01-21 MED ORDER — SULFAMETHOXAZOLE-TRIMETHOPRIM 800-160 MG PO TABS: 1.0000 | ORAL_TABLET | Freq: Two times a day (BID) | ORAL | 0 refills | Status: DC

## 2021-01-21 NOTE — Progress Notes (Signed)
Please let patient know that her urine grew the bacteria Klebsiella.  I need to change her antibiotic to Bactrim.  I would like her to complete the antibiotic and repeat the urine culture in 3 weeks. I sent the new antibiotic to [facility].

## 2021-01-21 NOTE — Addendum Note (Signed)
Addended by: Jon Billings on: 01/21/2021 12:58 PM   Modules accepted: Orders

## 2021-01-21 NOTE — Addendum Note (Signed)
Addended by: Jon Billings on: 01/21/2021 02:28 PM   Modules accepted: Orders

## 2021-01-23 ENCOUNTER — Other Ambulatory Visit: Payer: Medicare Other

## 2021-01-23 ENCOUNTER — Ambulatory Visit: Payer: Medicare Other | Admitting: Internal Medicine

## 2021-01-27 ENCOUNTER — Other Ambulatory Visit: Payer: Self-pay | Admitting: Unknown Physician Specialty

## 2021-01-27 NOTE — Telephone Encounter (Signed)
Requested Prescriptions  Pending Prescriptions Disp Refills  . hydrALAZINE (APRESOLINE) 50 MG tablet [Pharmacy Med Name: hydrALAZINE HCl 50 MG Oral Tablet] 180 tablet 0    Sig: Take 1 tablet by mouth twice daily     Cardiovascular:  Vasodilators Passed - 01/27/2021  3:45 PM      Passed - HCT in normal range and within 360 days    Hematocrit  Date Value Ref Range Status  12/16/2020 37.6 34.0 - 46.6 % Final         Passed - HGB in normal range and within 360 days    Hemoglobin  Date Value Ref Range Status  12/16/2020 11.8 11.1 - 15.9 g/dL Final         Passed - RBC in normal range and within 360 days    RBC  Date Value Ref Range Status  12/16/2020 4.63 3.77 - 5.28 x10E6/uL Final  08/08/2020 4.24 3.87 - 5.11 MIL/uL Final         Passed - WBC in normal range and within 360 days    WBC  Date Value Ref Range Status  12/16/2020 4.0 3.4 - 10.8 x10E3/uL Final  08/08/2020 5.8 4.0 - 10.5 K/uL Final         Passed - PLT in normal range and within 360 days    Platelets  Date Value Ref Range Status  12/16/2020 152 150 - 450 x10E3/uL Final         Passed - Last BP in normal range    BP Readings from Last 1 Encounters:  01/16/21 131/86         Passed - Valid encounter within last 12 months    Recent Outpatient Visits          1 week ago Urinary tract infection symptoms   [facility] Jon Billings, NP   1 month ago Iron deficiency anemia due to chronic blood loss   [facility], [place], DO   1 month ago Diarrhea, unspecified type   [facility] Jon Billings, NP   2 months ago Urinary tract infection symptoms   [place], [place] T, NP   3 months ago Screening for colon cancer   [place], Barb Merino, DO      Future Appointments            In 2 weeks Wynetta Emery, Barb Merino, DO [place], [facility]   In 9 months  [facility], [place]           .  lisinopril-hydrochlorothiazide (ZESTORETIC) 20-25 MG tablet [Pharmacy Med Name: Lisinopril-hydroCHLOROthiazide 20-25 MG Oral Tablet] 180 tablet 0    Sig: Take 1 tablet by mouth twice daily     Cardiovascular:  ACEI + Diuretic Combos Passed - 01/27/2021  3:45 PM      Passed - Na in normal range and within 180 days    Sodium  Date Value Ref Range Status  12/16/2020 141 134 - 144 mmol/L Final         Passed - K in normal range and within 180 days    Potassium  Date Value Ref Range Status  12/16/2020 3.5 3.5 - 5.2 mmol/L Final         Passed - Cr in normal range and within 180 days    Creatinine, Ser  Date Value Ref Range Status  12/16/2020 0.80 0.57 - 1.00 mg/dL Final         Passed -  Ca in normal range and within 180 days    Calcium  Date Value Ref Range Status  12/16/2020 9.3 8.7 - 10.3 mg/dL Final         Passed - Patient is not pregnant      Passed - Last BP in normal range    BP Readings from Last 1 Encounters:  01/16/21 131/86         Passed - Valid encounter within last 6 months    Recent Outpatient Visits          1 week ago Urinary tract infection symptoms   [place], Santiago Glad, NP   1 month ago Iron deficiency anemia due to chronic blood loss   [facility], [place], DO   1 month ago Diarrhea, unspecified type   [facility], NP   2 months ago Urinary tract infection symptoms   [place], [place] T, NP   3 months ago Screening for colon cancer   [place], Megan P, DO      Future Appointments            In 2 weeks [place], Megan P, DO [place], [facility]   In 9 months  [facility], [facility]           . amLODipine ([place]) 5 MG tablet [Pharmacy Med Name: amLODIPine Besylate 5 MG Oral Tablet] 90 tablet 0    Sig: Take 1 tablet by mouth once daily     Cardiovascular:  Calcium Channel Blockers Passed - 01/27/2021  3:45 PM       Passed - Last BP in normal range    BP Readings from Last 1 Encounters:  01/16/21 131/86         Passed - Valid encounter within last 6 months    Recent Outpatient Visits          1 week ago Urinary tract infection symptoms   [place], Santiago Glad, NP   1 month ago Iron deficiency anemia due to chronic blood loss   [place], Megan P, DO   1 month ago Diarrhea, unspecified type   [place], NP   2 months ago Urinary tract infection symptoms   [place], [place] T, NP   3 months ago Screening for colon cancer   [place], DO      Future Appointments            In 2 weeks Wynetta Emery, Megan P, DO [place], [facility]   In 9 months  [facility], [place] addressing issue 05/01/20

## 2021-02-12 ENCOUNTER — Ambulatory Visit (INDEPENDENT_AMBULATORY_CARE_PROVIDER_SITE_OTHER): Payer: Medicare Other | Admitting: Family Medicine

## 2021-02-12 ENCOUNTER — Other Ambulatory Visit: Payer: Self-pay

## 2021-02-12 ENCOUNTER — Encounter: Payer: Self-pay | Admitting: Family Medicine

## 2021-02-12 VITALS — BP 125/83 | HR 99 | Temp 97.9°F | Wt 163.2 lb

## 2021-02-12 DIAGNOSIS — E782 Mixed hyperlipidemia: Secondary | ICD-10-CM

## 2021-02-12 DIAGNOSIS — I129 Hypertensive chronic kidney disease with stage 1 through stage 4 chronic kidney disease, or unspecified chronic kidney disease: Secondary | ICD-10-CM

## 2021-02-12 DIAGNOSIS — R8281 Pyuria: Secondary | ICD-10-CM | POA: Diagnosis not present

## 2021-02-12 DIAGNOSIS — D500 Iron deficiency anemia secondary to blood loss (chronic): Secondary | ICD-10-CM | POA: Diagnosis not present

## 2021-02-12 DIAGNOSIS — Z87440 Personal history of urinary (tract) infections: Secondary | ICD-10-CM

## 2021-02-12 LAB — MICROALBUMIN, URINE WAIVED
Creatinine, Urine Waived: 100 mg/dL (ref 10–300)
Microalb, Ur Waived: 30 mg/L — ABNORMAL HIGH (ref 0–19)
Microalb/Creat Ratio: <30 mg/g (ref <30)

## 2021-02-12 LAB — URINALYSIS, ROUTINE W REFLEX MICROSCOPIC
Bilirubin, UA: NEGATIVE
Glucose, UA: NEGATIVE
Ketones, UA: NEGATIVE
Nitrite, UA: NEGATIVE
Protein,UA: NEGATIVE
RBC, UA: NEGATIVE
Specific Gravity, UA: 1.015 (ref 1.005–1.030)
Urobilinogen, Ur: 0.2 mg/dL (ref 0.2–1.0)
pH, UA: 5 (ref 5.0–7.5)

## 2021-02-12 LAB — MICROSCOPIC EXAMINATION: RBC, Urine: NONE SEEN /hpf (ref 0–2)

## 2021-02-12 MED ORDER — LISINOPRIL-HYDROCHLOROTHIAZIDE 20-25 MG PO TABS: 1.0000 | ORAL_TABLET | Freq: Two times a day (BID) | ORAL | 1 refills | Status: DC

## 2021-02-12 MED ORDER — BACLOFEN 10 MG PO TABS: 10.0000 mg | ORAL_TABLET | Freq: Every evening | ORAL | 3 refills | Status: DC | PRN

## 2021-02-12 MED ORDER — HYDRALAZINE HCL 50 MG PO TABS: 50.0000 mg | ORAL_TABLET | Freq: Two times a day (BID) | ORAL | 1 refills | Status: DC

## 2021-02-12 MED ORDER — FERROUS SULFATE 325 (65 FE) MG PO TBEC: 325.0000 mg | DELAYED_RELEASE_TABLET | Freq: Three times a day (TID) | ORAL | 1 refills | Status: DC

## 2021-02-12 MED ORDER — AMLODIPINE BESYLATE 5 MG PO TABS: 1.0000 | ORAL_TABLET | Freq: Every day | ORAL | 1 refills | Status: DC

## 2021-02-12 MED ORDER — TRAZODONE HCL 50 MG PO TABS: 50.0000 mg | ORAL_TABLET | Freq: Every evening | ORAL | 2 refills | Status: DC | PRN

## 2021-02-12 NOTE — Progress Notes (Signed)
BP 125/83   Pulse 99   Temp 97.9 F (36.6 C)   Wt 163 lb 3.2 oz (74 kg)   SpO2 98%   BMI 29.85 kg/m    Subjective:    Patient ID: Kristina Henson, female    DOB: [birth year], 65 y.o.   MRN: [id number]  HPI: Kristina Henson is a 65 y.o. female  Chief Complaint  Patient presents with  . Hypertension  . Hyperlipidemia  . Anemia  . Urinary Tract Infection    Patient was told to follow up on urine in 3 weeks due to last urine culture coming back with bacteria    HYPERTENSION / [place] Satisfied with current treatment? yes Duration of hypertension: chronic BP monitoring frequency: not checking BP medication side effects: no Past BP meds: lisinopril, HCTZ, hydralazine, amlodipine Duration of hyperlipidemia: chronic Cholesterol medication side effects: not on anything Cholesterol supplements: none Past cholesterol medications: none Medication compliance: excellent compliance Aspirin: yes Recent stressors: no Recurrent headaches: no Visual changes: no Palpitations: no Dyspnea: no Chest pain: no Lower extremity edema: no Dizzy/lightheaded: no  ANEMIA Anemia status: stable Etiology of anemia: iron def Duration of anemia treatment: chronic Compliance with treatment: excellent compliance Iron supplementation side effects: no Severity of anemia: moderate Fatigue: yes Decreased exercise tolerance: no  Dyspnea on exertion: no Palpitations: no Bleeding: no Pica: no  No more problems in her urine. No burning. No issues with needing to go right away.   Relevant past medical, surgical, family and social history reviewed and updated as indicated. Interim medical history since our last visit reviewed. Allergies and medications reviewed and updated.  Review of Systems  Constitutional: Negative.   Respiratory: Negative.   Cardiovascular: Negative.   Gastrointestinal: Negative.   Musculoskeletal: Negative.   Psychiatric/Behavioral: Negative.     Per HPI unless  specifically indicated above     Objective:    BP 125/83   Pulse 99   Temp 97.9 F (36.6 C)   Wt 163 lb 3.2 oz (74 kg)   SpO2 98%   BMI 29.85 kg/m   Wt Readings from Last 3 Encounters:  02/12/21 163 lb 3.2 oz (74 kg)  01/16/21 152 lb (68.9 kg)  12/16/20 156 lb 12.8 oz (71.1 kg)    Physical Exam Vitals and nursing note reviewed.  Constitutional:      General: She is not in acute distress.    Appearance: Normal appearance. She is not ill-appearing, toxic-appearing or diaphoretic.  HENT:     Head: Normocephalic and atraumatic.     Right Ear: External ear normal.     Left Ear: External ear normal.     Nose: Nose normal.     Mouth/Throat:     Mouth: Mucous membranes are moist.     Pharynx: Oropharynx is clear.  Eyes:     General: No scleral icterus.       Right eye: No discharge.        Left eye: No discharge.     Extraocular Movements: Extraocular movements intact.     Conjunctiva/sclera: Conjunctivae normal.     Pupils: Pupils are equal, round, and reactive to light.  Cardiovascular:     Rate and Rhythm: Normal rate and regular rhythm.     Pulses: Normal pulses.     Heart sounds: Normal heart sounds. No murmur heard. No friction rub. No gallop.   Pulmonary:     Effort: Pulmonary effort is normal. No respiratory distress.     Breath sounds:  Normal breath sounds. No stridor. No wheezing, rhonchi or rales.  Chest:     Chest wall: No tenderness.  Musculoskeletal:        General: Normal range of motion.     Cervical back: Normal range of motion and neck supple.  Skin:    General: Skin is warm and dry.     Capillary Refill: Capillary refill takes less than 2 seconds.     Coloration: Skin is not jaundiced or pale.     Findings: No bruising, erythema, lesion or rash.  Neurological:     General: No focal deficit present.     Mental Status: She is alert and oriented to person, place, and time. Mental status is at baseline.  Psychiatric:        Mood and Affect: Mood  normal.        Behavior: Behavior normal.        Thought Content: Thought content normal.        Judgment: Judgment normal.     Results for orders placed or performed in visit on 01/16/21  Urine Culture   Specimen: Urine   UR  Result Value Ref Range   Urine Culture, Routine Final report (A)    Organism ID, Bacteria Klebsiella pneumoniae (A)    Antimicrobial Susceptibility Comment   Microscopic Examination   Urine  Result Value Ref Range   WBC, UA 11-30 (A) 0 - 5 /hpf   RBC None seen 0 - 2 /hpf   Epithelial Cells (non renal) 0-10 0 - 10 /hpf   Bacteria, UA Many (A) None seen/Few  Urinalysis, Routine w reflex microscopic  Result Value Ref Range   Specific Gravity, UA 1.015 1.005 - 1.030   pH, UA 5.5 5.0 - 7.5   Color, UA Yellow Yellow   Appearance Ur Cloudy (A) Clear   Leukocytes,UA 2+ (A) Negative   Protein,UA Negative Negative/Trace   Glucose, UA Negative Negative   Ketones, UA Negative Negative   RBC, UA Negative Negative   Bilirubin, UA Negative Negative   Urobilinogen, Ur 0.2 0.2 - 1.0 mg/dL   Nitrite, UA Negative Negative   Microscopic Examination See below:       Assessment & Plan:   Problem List Items Addressed This Visit      Genitourinary   Benign hypertensive renal disease - Primary    Under good control on current regimen. Continue current regimen. Continue to monitor. Call with any concerns. Refills given. Labs drawn today.       Relevant Orders   Comprehensive metabolic panel   Microalbumin, Urine Waived     Other   HLD (hyperlipidemia)    Rechecking labs today. Await results.        Relevant Medications   lisinopril-hydrochlorothiazide (ZESTORETIC) 20-25 MG tablet   hydrALAZINE (APRESOLINE) 50 MG tablet   amLODipine (NORVASC) 5 MG tablet   Other Relevant Orders   Comprehensive metabolic panel   Lipid Panel [place], Waived   Iron deficiency anemia due to chronic blood loss    Rechecking labs today. Await results. Treat as needed.       Relevant Medications   ferrous sulfate 325 (65 FE) MG EC tablet   Other Relevant Orders   CBC with Differential/Platelet   Comprehensive metabolic panel   Ferritin   Iron and TIBC    Other Visit Diagnoses    History of UTI       Rechecking labs today.    Relevant Orders   Urinalysis, Routine w  reflex microscopic   Pyuria       Await culture.    Relevant Orders   Urine Culture       Follow up plan: Return in about 6 months (around 08/15/2021).

## 2021-02-12 NOTE — Assessment & Plan Note (Signed)
Rechecking labs today. Await results. Treat as needed.  °

## 2021-02-12 NOTE — Assessment & Plan Note (Signed)
Under good control on current regimen. Continue current regimen. Continue to monitor. Call with any concerns. Refills given. Labs drawn today.   

## 2021-02-12 NOTE — Assessment & Plan Note (Signed)
Rechecking labs today. Await results.  

## 2021-02-13 LAB — COMPREHENSIVE METABOLIC PANEL
ALT: 26 IU/L (ref 0–32)
AST: 20 IU/L (ref 0–40)
Albumin/Globulin Ratio: 1.3 (ref 1.2–2.2)
Albumin: 4 g/dL (ref 3.8–4.8)
Alkaline Phosphatase: 92 IU/L (ref 44–121)
BUN/Creatinine Ratio: 31 — ABNORMAL HIGH (ref 12–28)
BUN: 24 mg/dL (ref 8–27)
Bilirubin Total: 0.5 mg/dL (ref 0.0–1.2)
CO2: 24 mmol/L (ref 20–29)
Calcium: 9.5 mg/dL (ref 8.7–10.3)
Chloride: 101 mmol/L (ref 96–106)
Creatinine, Ser: 0.78 mg/dL (ref 0.57–1.00)
Globulin, Total: 3.1 g/dL (ref 1.5–4.5)
Glucose: 93 mg/dL (ref 65–99)
Potassium: 3.7 mmol/L (ref 3.5–5.2)
Sodium: 139 mmol/L (ref 134–144)
Total Protein: 7.1 g/dL (ref 6.0–8.5)
eGFR: 85 mL/min/{1.73_m2} (ref >59)

## 2021-02-13 LAB — CBC WITH DIFFERENTIAL/PLATELET
Basophils Absolute: 0 10*3/uL (ref 0.0–0.2)
Basos: 1 %
EOS (ABSOLUTE): 0.1 10*3/uL (ref 0.0–0.4)
Eos: 1 %
Hematocrit: 35.7 % (ref 34.0–46.6)
Hemoglobin: 11.7 g/dL (ref 11.1–15.9)
Immature Grans (Abs): 0 10*3/uL (ref 0.0–0.1)
Immature Granulocytes: 1 %
Lymphocytes Absolute: 0.6 10*3/uL — ABNORMAL LOW (ref 0.7–3.1)
Lymphs: 17 %
MCH: 27.7 pg (ref 26.6–33.0)
MCHC: 32.8 g/dL (ref 31.5–35.7)
MCV: 85 fL (ref 79–97)
Monocytes Absolute: 0.3 10*3/uL (ref 0.1–0.9)
Monocytes: 8 %
Neutrophils Absolute: 2.6 10*3/uL (ref 1.4–7.0)
Neutrophils: 72 %
Platelets: 145 10*3/uL — ABNORMAL LOW (ref 150–450)
RBC: 4.22 x10E6/uL (ref 3.77–5.28)
RDW: 16.5 % — ABNORMAL HIGH (ref 11.7–15.4)
WBC: 3.6 10*3/uL (ref 3.4–10.8)

## 2021-02-13 LAB — IRON AND TIBC
Iron Saturation: 17 % (ref 15–55)
Iron: 45 ug/dL (ref 27–139)
Total Iron Binding Capacity: 271 ug/dL (ref 250–450)
UIBC: 226 ug/dL (ref 118–369)

## 2021-02-13 LAB — FERRITIN: Ferritin: 163 ng/mL — ABNORMAL HIGH (ref 15–150)

## 2021-02-14 LAB — LIPID PANEL W/O CHOL/HDL RATIO
Cholesterol, Total: 172 mg/dL (ref 100–199)
HDL: 48 mg/dL (ref >39)
LDL Chol Calc (NIH): 100 mg/dL — ABNORMAL HIGH (ref 0–99)
Triglycerides: 135 mg/dL (ref 0–149)
VLDL Cholesterol Cal: 24 mg/dL (ref 5–40)

## 2021-02-14 LAB — SPECIMEN STATUS REPORT

## 2021-02-16 ENCOUNTER — Ambulatory Visit: Payer: Medicare Other | Admitting: Family Medicine

## 2021-02-16 ENCOUNTER — Encounter: Payer: Self-pay | Admitting: Family Medicine

## 2021-02-18 LAB — URINE CULTURE

## 2021-04-01 DIAGNOSIS — Z23 Encounter for immunization: Secondary | ICD-10-CM | POA: Diagnosis not present

## 2021-04-13 ENCOUNTER — Ambulatory Visit: Payer: Self-pay | Admitting: *Deleted

## 2021-04-13 NOTE — Telephone Encounter (Signed)
Patient called to report bleeding during urination. Call lost prior to transfer to NT. Attempted to call patient back x 2 and left voicemail to call clinic back to review symptoms. Appt scheduled with different provider on 04/14/21.

## 2021-04-13 NOTE — Telephone Encounter (Signed)
noted 

## 2021-04-14 ENCOUNTER — Other Ambulatory Visit: Payer: Self-pay

## 2021-04-14 ENCOUNTER — Encounter: Payer: Self-pay | Admitting: Nurse Practitioner

## 2021-04-14 ENCOUNTER — Ambulatory Visit (INDEPENDENT_AMBULATORY_CARE_PROVIDER_SITE_OTHER): Payer: Medicare Other | Admitting: Nurse Practitioner

## 2021-04-14 VITALS — BP 159/76 | HR 60 | Wt 163.0 lb

## 2021-04-14 DIAGNOSIS — R8281 Pyuria: Secondary | ICD-10-CM

## 2021-04-14 DIAGNOSIS — G4700 Insomnia, unspecified: Secondary | ICD-10-CM | POA: Diagnosis not present

## 2021-04-14 DIAGNOSIS — N3000 Acute cystitis without hematuria: Secondary | ICD-10-CM

## 2021-04-14 MED ORDER — NITROFURANTOIN MONOHYD MACRO 100 MG PO CAPS: 100.0000 mg | ORAL_CAPSULE | Freq: Two times a day (BID) | ORAL | 0 refills | Status: DC

## 2021-04-14 NOTE — Assessment & Plan Note (Signed)
Discussed sleep hygiene and having night time routine. Limit caffeine to the morning. Has tried trazadone and melatonin. Will refer to sleep medicine per patient request.

## 2021-04-14 NOTE — Progress Notes (Signed)
Acute Office Visit  Subjective:    Patient ID: Kristina Henson, female    DOB: [birth year], 65 y.o.   MRN: [id number]  Chief Complaint  Patient presents with   Hematuria    Patient states she was having blood in urine over the weekend.   Insomnia    Patient states trazadone does not help with sleep, patient can not fall asleep some nights.    HPI Patient is in today for blood in her urine on Saturday morning. Noticed blood 3-4 times while urinating until 11am. Has also noted blood on toilet tissue. She has not seen blood since. She had a colonscopy in March, and was told she had hemorrhoids. She endorses stomach pain before the episode. Denies back pain, dysuria, urgency, frequency. Does not feel like prior UTI. No symptoms since Saturday.   INSOMNIA  She started tazadone taking 1 tablet and then taking 2 tablets and is still having trouble sleeping. She has also tried melatonin in the past. She wants to see a sleep medicine doctor.   Duration: months Satisfied with sleep quality: no Difficulty falling asleep: yes Difficulty staying asleep: no Waking a few hours after sleep onset: no Early morning awakenings: no Daytime hypersomnolence: yes Wakes feeling refreshed: no Good sleep hygiene: no Apnea: no Snoring: no Depressed/anxious mood: no Recent stress: no Restless legs/nocturnal leg cramps: no Chronic pain/arthritis: no History of sleep study: no Treatments attempted: melatonin and trazadone   Past Medical History:  Diagnosis Date   Anemia    Arthritis    BACK-LUMBAR   Atrial fibrillation (HCC)    Coronary artery disease    Duodenitis    Dysrhythmia    Gastritis    Hypertension    Mass in neck    lt side   Pneumonia 2016   Stroke ([place]) 2012   Stroke [facility]) 2012    Past Surgical History:  Procedure Laterality Date   ABDOMINAL HYSTERECTOMY     Total   cardiac catherization     COLONOSCOPY WITH PROPOFOL N/A 11/18/2015   Procedure: COLONOSCOPY WITH  PROPOFOL;  Surgeon: Lucilla Lame, MD;  Location: [facility] ENDOSCOPY;  Service: Endoscopy;  Laterality: N/A;   COLONOSCOPY WITH PROPOFOL N/A 12/02/2020   Procedure: COLONOSCOPY WITH PROPOFOL;  Surgeon: Lucilla Lame, MD;  Location: [facility] ENDOSCOPY;  Service: Endoscopy;  Laterality: N/A;   CORONARY ANGIOPLASTY     ESOPHAGOGASTRODUODENOSCOPY (EGD) WITH PROPOFOL N/A 04/23/2016   Procedure: ESOPHAGOGASTRODUODENOSCOPY (EGD) WITH PROPOFOL;  Surgeon: Lucilla Lame, MD;  Location: [facility];  Service: Endoscopy;  Laterality: N/A;  small bowel bx gastric antrum bx   SUBMANDIBULAR GLAND EXCISION Left 09/08/2017   Procedure: EXCISION SUBMANDIBULAR GLAND;  Surgeon: Carloyn Manner, MD;  Location: [facility] ORS;  Service: ENT;  Laterality: Left;   throat biopsy      Family History  Problem Relation Age of Onset   Arthritis Mother    Cancer Mother    Hypertension Sister    Asthma Brother    Dementia Brother    Breast cancer Neg Hx    Kidney cancer Neg Hx    Bladder Cancer Neg Hx     Social History   Socioeconomic History   Marital status: Divorced    Spouse name: Not on file   Number of children: Not on file   Years of education: 12   Highest education level: 12th grade  Occupational History   Occupation: disability   Tobacco Use   Smoking status: Former    Types: Cigarettes  Quit date: 31    Years since quitting: 44.5   Smokeless tobacco: Never   Tobacco comments:    didnt smoke much   Vaping Use   Vaping Use: Never used  Substance and Sexual Activity   Alcohol use: Yes    Alcohol/week: 1.0 standard drink    Types: 1 Glasses of wine per week    Comment: occasionally   Drug use: No   Sexual activity: Not Currently  Other Topics Concern   Not on file  Social History Narrative   Not on file   Social Determinants of Health   Financial Resource Strain: Low Risk    Difficulty of Paying Living Expenses: Not hard at all  Food Insecurity: No Food Insecurity   Worried About  Charity fundraiser in the Last Year: Never true   [place] in the Last Year: Never true  Transportation Needs: No Transportation Needs   Lack of Transportation (Medical): No   Lack of Transportation (Non-Medical): No  Physical Activity: Inactive   Days of Exercise per Week: 0 days   Minutes of Exercise per Session: 0 min  Stress: No Stress Concern Present   Feeling of Stress : Not at all  Social Connections: Not on file  Intimate Partner Violence: Not on file    Outpatient Medications Prior to Visit  Medication Sig Dispense Refill   amiodarone (PACERONE) 200 MG tablet Take 1 tablet (200 mg total) by mouth every morning. 30 tablet 0   amLODipine (NORVASC) 5 MG tablet Take 1 tablet (5 mg total) by mouth daily. 90 tablet 1   Ascorbic Acid (VITAMIN C) 100 MG tablet Take 100 mg by mouth daily.     aspirin EC 81 MG tablet Take 81 mg by mouth daily.      baclofen (LIORESAL) 10 MG tablet Take 1 tablet (10 mg total) by mouth at bedtime as needed for muscle spasms. 30 each 3   CRANBERRY PO Take by mouth.     ferrous sulfate 325 (65 FE) MG EC tablet Take 1 tablet (325 mg total) by mouth 3 (three) times daily with meals. 270 tablet 1   hydrALAZINE (APRESOLINE) 50 MG tablet Take 1 tablet (50 mg total) by mouth 2 (two) times daily. 180 tablet 1   lisinopril-hydrochlorothiazide (ZESTORETIC) 20-25 MG tablet Take 1 tablet by mouth 2 (two) times daily. 180 tablet 1   traZODone (DESYREL) 50 MG tablet Take 1-2 tablets (50-100 mg total) by mouth at bedtime as needed for sleep. 60 tablet 2   VITAMIN D PO Take by mouth daily.     No facility-administered medications prior to visit.    Allergies  Allergen Reactions   Amoxicillin Other (See Comments)    Joint pains    Review of Systems  Constitutional:  Positive for fatigue.  HENT: Negative.    Respiratory: Negative.    Cardiovascular: Negative.   Gastrointestinal: Negative.   Genitourinary:  Positive for hematuria (few episodes on  Saturday). Negative for difficulty urinating, dysuria and urgency.  Musculoskeletal: Negative.   Skin: Negative.   Neurological: Negative.   Psychiatric/Behavioral:  Positive for sleep disturbance.       Objective:    Physical Exam Vitals and nursing note reviewed.  Constitutional:      General: She is not in acute distress.    Appearance: Normal appearance.  HENT:     Head: Normocephalic.  Eyes:     Conjunctiva/sclera: Conjunctivae normal.  Cardiovascular:     Rate  and Rhythm: Normal rate and regular rhythm.     Pulses: Normal pulses.     Heart sounds: Normal heart sounds.  Pulmonary:     Effort: Pulmonary effort is normal.     Breath sounds: Normal breath sounds.  Abdominal:     Palpations: Abdomen is soft.     Tenderness: There is no abdominal tenderness. There is no right CVA tenderness or left CVA tenderness.  Musculoskeletal:     Cervical back: Normal range of motion.  Skin:    General: Skin is warm.  Neurological:     General: No focal deficit present.     Mental Status: She is alert and oriented to person, place, and time.  Psychiatric:        Mood and Affect: Mood normal.        Behavior: Behavior normal.        Thought Content: Thought content normal.        Judgment: Judgment normal.    BP (!) 159/76   Pulse 60   Wt 163 lb (73.9 kg)   SpO2 96%   BMI 29.81 kg/m  Wt Readings from Last 3 Encounters:  04/14/21 163 lb (73.9 kg)  02/12/21 163 lb 3.2 oz (74 kg)  01/16/21 152 lb (68.9 kg)    Health Maintenance Due  Topic Date Due   Zoster Vaccines- Shingrix (1 of 2) Never done   COVID-19 Vaccine (3 - Booster for Moderna series) 05/14/2020    There are no preventive care reminders to display for this patient.   Lab Results  Component Value Date   TSH 0.929 11/02/2019   Lab Results  Component Value Date   WBC 3.6 02/12/2021   HGB 11.7 02/12/2021   HCT 35.7 02/12/2021   MCV 85 02/12/2021   PLT 145 (L) 02/12/2021   Lab Results  Component  Value Date   NA 139 02/12/2021   K 3.7 02/12/2021   CO2 24 02/12/2021   GLUCOSE 93 02/12/2021   BUN 24 02/12/2021   CREATININE 0.78 02/12/2021   BILITOT 0.5 02/12/2021   ALKPHOS 92 02/12/2021   AST 20 02/12/2021   ALT 26 02/12/2021   PROT 7.1 02/12/2021   ALBUMIN 4.0 02/12/2021   CALCIUM 9.5 02/12/2021   ANIONGAP 13 08/08/2020   EGFR 85 02/12/2021   Lab Results  Component Value Date   CHOL 172 02/12/2021   Lab Results  Component Value Date   HDL 48 02/12/2021   Lab Results  Component Value Date   LDLCALC 100 (H) 02/12/2021   Lab Results  Component Value Date   TRIG 135 02/12/2021   No results found for: CHOLHDL No results found for: HGBA1C     Assessment & Plan:   Problem List Items Addressed This Visit       Other   Insomnia    Discussed sleep hygiene and having night time routine. Limit caffeine to the morning. Has tried trazadone and melatonin. Will refer to sleep medicine per patient request.        Relevant Orders   Ambulatory referral to Sleep Studies   Other Visit Diagnoses     Pyuria    -  Primary   U/A negative for blood. Showed moderate bacteria and 3+ leukocytes. Will treat for U/A   Relevant Orders   Urinalysis, Routine w reflex microscopic   Acute cystitis without hematuria       Will treat with macrobid. send urine for culture. If notices more blood in urine  again, call office   Relevant Orders   Urine Culture        Meds ordered this encounter  Medications   nitrofurantoin, macrocrystal-monohydrate, (MACROBID) 100 MG capsule    Sig: Take 1 capsule (100 mg total) by mouth 2 (two) times daily.    Dispense:  10 capsule    Refill:  0     Charyl Dancer, NP

## 2021-04-15 LAB — MICROSCOPIC EXAMINATION: RBC, Urine: NONE SEEN /hpf (ref 0–2)

## 2021-04-15 LAB — URINALYSIS, ROUTINE W REFLEX MICROSCOPIC
Bilirubin, UA: NEGATIVE
Glucose, UA: NEGATIVE
Ketones, UA: NEGATIVE
Nitrite, UA: NEGATIVE
Protein,UA: NEGATIVE
RBC, UA: NEGATIVE
Specific Gravity, UA: 1.02 (ref 1.005–1.030)
Urobilinogen, Ur: 0.2 mg/dL (ref 0.2–1.0)
pH, UA: 5.5 (ref 5.0–7.5)

## 2021-04-16 LAB — URINE CULTURE

## 2021-05-01 ENCOUNTER — Inpatient Hospital Stay: Payer: Medicare Other

## 2021-05-01 ENCOUNTER — Inpatient Hospital Stay: Payer: Medicare Other | Admitting: Internal Medicine

## 2021-05-02 ENCOUNTER — Other Ambulatory Visit: Payer: Self-pay | Admitting: Family Medicine

## 2021-05-02 NOTE — Telephone Encounter (Signed)
last RF 02/12/21 #90 1 RF

## 2021-05-04 DIAGNOSIS — I480 Paroxysmal atrial fibrillation: Secondary | ICD-10-CM | POA: Diagnosis not present

## 2021-05-04 DIAGNOSIS — I700 Atherosclerosis of aorta: Secondary | ICD-10-CM | POA: Diagnosis not present

## 2021-05-04 DIAGNOSIS — E7801 Familial hypercholesterolemia: Secondary | ICD-10-CM | POA: Diagnosis not present

## 2021-05-06 ENCOUNTER — Ambulatory Visit: Payer: Medicare Other | Admitting: Family Medicine

## 2021-05-07 ENCOUNTER — Encounter: Payer: Self-pay | Admitting: Family Medicine

## 2021-05-07 ENCOUNTER — Encounter: Payer: Self-pay | Admitting: Internal Medicine

## 2021-05-07 ENCOUNTER — Ambulatory Visit (INDEPENDENT_AMBULATORY_CARE_PROVIDER_SITE_OTHER): Payer: Medicare Other | Admitting: Family Medicine

## 2021-05-07 ENCOUNTER — Other Ambulatory Visit: Payer: Self-pay

## 2021-05-07 VITALS — BP 138/73 | HR 64 | Temp 98.0°F | Wt 165.4 lb

## 2021-05-07 DIAGNOSIS — N390 Urinary tract infection, site not specified: Secondary | ICD-10-CM

## 2021-05-07 DIAGNOSIS — R310 Gross hematuria: Secondary | ICD-10-CM | POA: Diagnosis not present

## 2021-05-07 DIAGNOSIS — R300 Dysuria: Secondary | ICD-10-CM

## 2021-05-07 LAB — URINALYSIS, ROUTINE W REFLEX MICROSCOPIC
Bilirubin, UA: NEGATIVE
Glucose, UA: NEGATIVE
Ketones, UA: NEGATIVE
Nitrite, UA: NEGATIVE
Protein,UA: NEGATIVE
RBC, UA: NEGATIVE
Specific Gravity, UA: 1.02 (ref 1.005–1.030)
Urobilinogen, Ur: 0.2 mg/dL (ref 0.2–1.0)
pH, UA: 5 (ref 5.0–7.5)

## 2021-05-07 LAB — MICROSCOPIC EXAMINATION: RBC, Urine: NONE SEEN /hpf (ref 0–2)

## 2021-05-07 MED ORDER — CIPROFLOXACIN HCL 500 MG PO TABS: 500.0000 mg | ORAL_TABLET | Freq: Two times a day (BID) | ORAL | 0 refills | Status: DC

## 2021-05-07 NOTE — Progress Notes (Signed)
BP 138/73   Pulse 64   Temp 98 F (36.7 C) (Oral)   Wt 165 lb 6.4 oz (75 kg)   SpO2 95%   BMI 30.25 kg/m    Subjective:    Patient ID: Kristina Henson, female    DOB: 1956/05/05, 65 y.o.   MRN: [id number]  HPI: Kristina Henson is a 65 y.o. female  Chief Complaint  Patient presents with   Urinary Tract Infection    Pt states she has had some blood in her urine, low back pain, and urinary frequency since Sunday   URINARY SYMPTOMS- this is the 4th UTI she's had this year Duration: 5 days ago Dysuria: yes Urinary frequency: yes Urgency: yes Small volume voids: yes Symptom severity: severe Urinary incontinence: no Foul odor: no Hematuria: yes Abdominal pain: no Back pain: yes Suprapubic pain/pressure: no Flank pain: no Fever:  no Vomiting: no Relief with cranberry juice: no Relief with pyridium: no Status: worse Previous urinary tract infection: yes Recurrent urinary tract infection: no History of sexually transmitted disease: no Vaginal discharge: no Treatments attempted: increasing fluids   Relevant past medical, surgical, family and social history reviewed and updated as indicated. Interim medical history since our last visit reviewed. Allergies and medications reviewed and updated.  Review of Systems  Constitutional: Negative.   Respiratory: Negative.    Cardiovascular: Negative.   Gastrointestinal: Negative.   Musculoskeletal: Negative.   Neurological: Negative.   Psychiatric/Behavioral: Negative.     Per HPI unless specifically indicated above     Objective:    BP 138/73   Pulse 64   Temp 98 F (36.7 C) (Oral)   Wt 165 lb 6.4 oz (75 kg)   SpO2 95%   BMI 30.25 kg/m   Wt Readings from Last 3 Encounters:  05/07/21 165 lb 6.4 oz (75 kg)  04/14/21 163 lb (73.9 kg)  02/12/21 163 lb 3.2 oz (74 kg)    Physical Exam Vitals and nursing note reviewed.  Constitutional:      General: She is not in acute distress.    Appearance: Normal appearance.  She is not ill-appearing, toxic-appearing or diaphoretic.  HENT:     Head: Normocephalic and atraumatic.     Right Ear: External ear normal.     Left Ear: External ear normal.     Nose: Nose normal.     Mouth/Throat:     Mouth: Mucous membranes are moist.     Pharynx: Oropharynx is clear.  Eyes:     General: No scleral icterus.       Right eye: No discharge.        Left eye: No discharge.     Extraocular Movements: Extraocular movements intact.     Conjunctiva/sclera: Conjunctivae normal.     Pupils: Pupils are equal, round, and reactive to light.  Cardiovascular:     Rate and Rhythm: Normal rate and regular rhythm.     Pulses: Normal pulses.     Heart sounds: Normal heart sounds. No murmur heard.   No friction rub. No gallop.  Pulmonary:     Effort: Pulmonary effort is normal. No respiratory distress.     Breath sounds: Normal breath sounds. No stridor. No wheezing, rhonchi or rales.  Chest:     Chest wall: No tenderness.  Abdominal:     General: Abdomen is flat. Bowel sounds are normal. There is no distension.     Palpations: Abdomen is soft. There is no mass.  Tenderness: There is no abdominal tenderness. There is right CVA tenderness. There is no guarding or rebound.     Hernia: No hernia is present.  Musculoskeletal:        General: Normal range of motion.     Cervical back: Normal range of motion and neck supple.  Skin:    General: Skin is warm and dry.     Capillary Refill: Capillary refill takes less than 2 seconds.     Coloration: Skin is not jaundiced or pale.     Findings: No bruising, erythema, lesion or rash.  Neurological:     General: No focal deficit present.     Mental Status: She is alert and oriented to person, place, and time. Mental status is at baseline.  Psychiatric:        Mood and Affect: Mood normal.        Behavior: Behavior normal.        Thought Content: Thought content normal.        Judgment: Judgment normal.    Results for orders  placed or performed in visit on 05/07/21  Microscopic Examination   Urine  Result Value Ref Range   WBC, UA 0-5 0 - 5 /hpf   RBC None seen 0 - 2 /hpf   Epithelial Cells (non renal) 0-10 0 - 10 /hpf   Mucus, UA Present (A) Not Estab.   Bacteria, UA Many (A) None seen/Few  Urinalysis, Routine w reflex microscopic  Result Value Ref Range   Specific Gravity, UA 1.020 1.005 - 1.030   pH, UA 5.0 5.0 - 7.5   Color, UA Yellow Yellow   Appearance Ur Cloudy (A) Clear   Leukocytes,UA 1+ (A) Negative   Protein,UA Negative Negative/Trace   Glucose, UA Negative Negative   Ketones, UA Negative Negative   RBC, UA Negative Negative   Bilirubin, UA Negative Negative   Urobilinogen, Ur 0.2 0.2 - 1.0 mg/dL   Nitrite, UA Negative Negative   Microscopic Examination See below:       Assessment & Plan:   Problem List Items Addressed This Visit   None Visit Diagnoses     Recurrent UTI    -  Primary   Will treat with cipro while we await culture due to CVA tenderness. Not on UA today. Would like to see urology. Referral generated today.    Relevant Orders   Ambulatory referral to Urology   Gross hematuria       Not on UA today. Would like to see urology. Referral generated today.    Relevant Orders   Ambulatory referral to Urology   Dysuria       1+ leuks   Relevant Orders   Urinalysis, Routine w reflex microscopic (Completed)   Urine Culture        Follow up plan: Return if symptoms worsen or fail to improve.

## 2021-05-07 NOTE — Patient Instructions (Signed)
[facility] [id number], [place], [place] [address] Phone: [phone]

## 2021-05-08 ENCOUNTER — Inpatient Hospital Stay: Payer: Medicare Other

## 2021-05-08 ENCOUNTER — Inpatient Hospital Stay (HOSPITAL_BASED_OUTPATIENT_CLINIC_OR_DEPARTMENT_OTHER): Payer: Medicare Other | Admitting: Oncology

## 2021-05-08 ENCOUNTER — Inpatient Hospital Stay: Payer: Medicare Other | Attending: Internal Medicine

## 2021-05-08 ENCOUNTER — Encounter: Payer: Self-pay | Admitting: Oncology

## 2021-05-08 VITALS — BP 146/78 | HR 58 | Temp 98.1°F | Resp 16 | Wt 166.8 lb

## 2021-05-08 DIAGNOSIS — D72819 Decreased white blood cell count, unspecified: Secondary | ICD-10-CM | POA: Insufficient documentation

## 2021-05-08 DIAGNOSIS — Z79899 Other long term (current) drug therapy: Secondary | ICD-10-CM | POA: Diagnosis not present

## 2021-05-08 DIAGNOSIS — D638 Anemia in other chronic diseases classified elsewhere: Secondary | ICD-10-CM

## 2021-05-08 DIAGNOSIS — E785 Hyperlipidemia, unspecified: Secondary | ICD-10-CM | POA: Insufficient documentation

## 2021-05-08 DIAGNOSIS — G4700 Insomnia, unspecified: Secondary | ICD-10-CM

## 2021-05-08 DIAGNOSIS — N390 Urinary tract infection, site not specified: Secondary | ICD-10-CM | POA: Diagnosis not present

## 2021-05-08 DIAGNOSIS — D500 Iron deficiency anemia secondary to blood loss (chronic): Secondary | ICD-10-CM

## 2021-05-08 DIAGNOSIS — Z8673 Personal history of transient ischemic attack (TIA), and cerebral infarction without residual deficits: Secondary | ICD-10-CM | POA: Diagnosis not present

## 2021-05-08 DIAGNOSIS — Z87440 Personal history of urinary (tract) infections: Secondary | ICD-10-CM | POA: Insufficient documentation

## 2021-05-08 DIAGNOSIS — R319 Hematuria, unspecified: Secondary | ICD-10-CM | POA: Diagnosis not present

## 2021-05-08 DIAGNOSIS — I4891 Unspecified atrial fibrillation: Secondary | ICD-10-CM | POA: Diagnosis not present

## 2021-05-08 DIAGNOSIS — Z7982 Long term (current) use of aspirin: Secondary | ICD-10-CM | POA: Insufficient documentation

## 2021-05-08 DIAGNOSIS — I10 Essential (primary) hypertension: Secondary | ICD-10-CM | POA: Insufficient documentation

## 2021-05-08 DIAGNOSIS — D649 Anemia, unspecified: Secondary | ICD-10-CM | POA: Insufficient documentation

## 2021-05-08 LAB — BASIC METABOLIC PANEL
Anion gap: 10 (ref 5–15)
BUN: 28 mg/dL — ABNORMAL HIGH (ref 8–23)
CO2: 26 mmol/L (ref 22–32)
Calcium: 9.3 mg/dL (ref 8.9–10.3)
Chloride: 103 mmol/L (ref 98–111)
Creatinine, Ser: 0.96 mg/dL (ref 0.44–1.00)
GFR, Estimated: >60 mL/min (ref >60)
Glucose, Bld: 111 mg/dL — ABNORMAL HIGH (ref 70–99)
Potassium: 3.4 mmol/L — ABNORMAL LOW (ref 3.5–5.1)
Sodium: 139 mmol/L (ref 135–145)

## 2021-05-08 LAB — IRON AND TIBC
Iron: 33 ug/dL (ref 28–170)
Saturation Ratios: 11 % (ref 10.4–31.8)
TIBC: 315 ug/dL (ref 250–450)
UIBC: 282 ug/dL

## 2021-05-08 LAB — CBC WITH DIFFERENTIAL/PLATELET
Abs Immature Granulocytes: 0.02 10*3/uL (ref 0.00–0.07)
Basophils Absolute: 0 10*3/uL (ref 0.0–0.1)
Basophils Relative: 1 %
Eosinophils Absolute: 0 10*3/uL (ref 0.0–0.5)
Eosinophils Relative: 1 %
HCT: 34.4 % — ABNORMAL LOW (ref 36.0–46.0)
Hemoglobin: 11.1 g/dL — ABNORMAL LOW (ref 12.0–15.0)
Immature Granulocytes: 1 %
Lymphocytes Relative: 22 %
Lymphs Abs: 0.7 10*3/uL (ref 0.7–4.0)
MCH: 29.2 pg (ref 26.0–34.0)
MCHC: 32.3 g/dL (ref 30.0–36.0)
MCV: 90.5 fL (ref 80.0–100.0)
Monocytes Absolute: 0.3 10*3/uL (ref 0.1–1.0)
Monocytes Relative: 11 %
Neutro Abs: 2 10*3/uL (ref 1.7–7.7)
Neutrophils Relative %: 64 %
Platelets: 129 10*3/uL — ABNORMAL LOW (ref 150–400)
RBC: 3.8 MIL/uL — ABNORMAL LOW (ref 3.87–5.11)
RDW: 15.5 % (ref 11.5–15.5)
WBC: 3.1 10*3/uL — ABNORMAL LOW (ref 4.0–10.5)
nRBC: 0 % (ref 0.0–0.2)

## 2021-05-08 LAB — FERRITIN: Ferritin: 128 ng/mL (ref 11–307)

## 2021-05-08 NOTE — Progress Notes (Signed)
[place] CONSULT NOTE  Patient Care Team: Valerie Roys, DO as PCP - General (Family Medicine) Ubaldo Glassing Javier Docker, MD as Consulting Physician (Cardiology) Carloyn Manner, MD as Referring Physician (Otolaryngology) Minor, Dalbert Garnet, RN (Inactive) as [place] Management Rogue Bussing, Elisha Headland, MD as Consulting Physician (Hematology and Oncology)  CHIEF COMPLAINTS/PURPOSE OF CONSULTATION:  # Anemia of chronic disease/mild intermittent leucopenia/thromboctyopenia? Autoimmune- EGD/ [place] [Dr.Wohl; 2017] chronic back pain [? 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 Rogue Bussing for iron deficiency anemia.  She was last seen in clinic on 10/31/2020 and she last required IV iron back in August 2018.  In the interim she was seen by GI and had a colonoscopy with Dr. Allen Norris.  Several polyps were discovered and retrieved and sent off for testing.  She had nonbleeding internal hemorrhoids.  All were negative for malignancy or dysplasia.   She has had several recurrent UTIs this year most recently on 05/07/2021.  She was referred to urology.  She presents back today to discuss recent lab work.  She reports hematuria, dysuria and increased urinary frequency for the past 4 to 5 days.  She will start on an antibiotic today after she leaves our clinic.  Reports insomnia.  States she has not slept in many months.  She takes occasional catnaps but overall does not sleep.  Reports she was started on a sleep medication but is not helping at all.  She feels  tired and weak.  Denies any bleeding except for hematuria.    Review of Systems  Constitutional:  Positive for malaise/fatigue. Negative for chills, fever and weight loss.  HENT:  Negative for congestion, ear pain and tinnitus.   Eyes: Negative.  Negative for blurred vision and double vision.  Respiratory: Negative.  Negative for cough, sputum production and shortness of breath.   Cardiovascular: Negative.  Negative for chest pain, palpitations and leg swelling.  Gastrointestinal: Negative.  Negative for abdominal pain, constipation, diarrhea, nausea and vomiting.  Genitourinary:  Positive for dysuria and hematuria. Negative for frequency and urgency.       Recurrent UTI-picking up antibiotics today from PCP.   Musculoskeletal:  Negative for back pain and falls.  Skin: Negative.  Negative for rash.  Neurological:  Positive for weakness. Negative for headaches.  Endo/Heme/Allergies: Negative.  Does not bruise/bleed easily.  Psychiatric/Behavioral:  Negative for depression. The patient has insomnia. The patient is not nervous/anxious.     MEDICAL HISTORY:  Past Medical History:  Diagnosis Date   Anemia    Arthritis    BACK-LUMBAR   Atrial fibrillation ([place])    Coronary artery disease    Duodenitis    Dysrhythmia    Gastritis    Hypertension    Mass in neck    lt side   Pneumonia 2016   Stroke ([place]) 2012   Stroke [facility]) 2012    SURGICAL HISTORY: Past Surgical History:  Procedure Laterality Date   ABDOMINAL HYSTERECTOMY     Total   cardiac catherization     COLONOSCOPY WITH PROPOFOL N/A 11/18/2015   Procedure: COLONOSCOPY WITH PROPOFOL;  Surgeon: Evangeline Gula  Allen Norris, MD;  Location: [facility] ENDOSCOPY;  Service: Endoscopy;  Laterality: N/A;   COLONOSCOPY WITH PROPOFOL N/A 12/02/2020   Procedure: COLONOSCOPY WITH PROPOFOL;  Surgeon: Lucilla Lame, MD;  Location: [facility] ENDOSCOPY;  Service: Endoscopy;  Laterality: N/A;   CORONARY ANGIOPLASTY     ESOPHAGOGASTRODUODENOSCOPY (EGD) WITH PROPOFOL  N/A 04/23/2016   Procedure: ESOPHAGOGASTRODUODENOSCOPY (EGD) WITH PROPOFOL;  Surgeon: Lucilla Lame, MD;  Location: [place];  Service: Endoscopy;  Laterality: N/A;  small bowel bx gastric antrum bx   SUBMANDIBULAR GLAND EXCISION Left 09/08/2017   Procedure: EXCISION SUBMANDIBULAR GLAND;  Surgeon: Carloyn Manner, MD;  Location: [facility] ORS;  Service: ENT;  Laterality: Left;   throat biopsy      SOCIAL HISTORY: Social History   Socioeconomic History   Marital status: Divorced    Spouse name: Not on file   Number of children: Not on file   Years of education: 12   Highest education level: 12th grade  Occupational History   Occupation: disability   Tobacco Use   Smoking status: Former    Types: Cigarettes    Quit date: 1978    Years since quitting: 44.6   Smokeless tobacco: Never   Tobacco comments:    didnt smoke much   Vaping Use   Vaping Use: Never used  Substance and Sexual Activity   Alcohol use: Yes    Alcohol/week: 1.0 standard drink    Types: 1 Glasses of wine per week    Comment: occasionally   Drug use: No   Sexual activity: Not Currently  Other Topics Concern   Not on file  Social History Narrative   Not on file   Social Determinants of Health   Financial Resource Strain: Low Risk    Difficulty of Paying Living Expenses: Not hard at all  Food Insecurity: No Food Insecurity   Worried About Charity fundraiser in the Last Year: Never true   Forks in the Last Year: Never true  Transportation Needs: No Transportation Needs   Lack of Transportation (Medical): No   Lack of Transportation (Non-Medical): No  Physical Activity: Inactive   Days of Exercise per Week: 0 days   Minutes of Exercise per Session: 0 min  Stress: No Stress Concern Present   Feeling of Stress : Not at all  Social Connections: Not on file  Intimate Partner Violence: Not on file    FAMILY HISTORY: Family History  Problem Relation Age of Onset   Arthritis Mother     Cancer Mother    Hypertension Sister    Asthma Brother    Dementia Brother    Breast cancer Neg Hx    Kidney cancer Neg Hx    Bladder Cancer Neg Hx     ALLERGIES:  is allergic to amoxicillin.  MEDICATIONS:  Current Outpatient Medications  Medication Sig Dispense Refill   amiodarone (PACERONE) 200 MG tablet Take 1 tablet (200 mg total) by mouth every morning. 30 tablet 0   amLODipine (NORVASC) 5 MG tablet Take 1 tablet (5 mg total) by mouth daily. 90 tablet 1   Ascorbic Acid (VITAMIN C) 100 MG tablet Take 100 mg by mouth daily.     aspirin EC 81 MG tablet Take 81 mg by mouth daily.      baclofen (LIORESAL) 10 MG tablet Take 1 tablet (10 mg total) by mouth at bedtime as needed for muscle spasms. 30 each 3   ciprofloxacin (CIPRO) 500 MG tablet Take 1 tablet (500 mg  total) by mouth 2 (two) times daily. 14 tablet 0   CRANBERRY PO Take by mouth.     ferrous sulfate 325 (65 FE) MG EC tablet Take 1 tablet (325 mg total) by mouth 3 (three) times daily with meals. 270 tablet 1   hydrALAZINE (APRESOLINE) 50 MG tablet Take 1 tablet (50 mg total) by mouth 2 (two) times daily. 180 tablet 1   lisinopril-hydrochlorothiazide (ZESTORETIC) 20-25 MG tablet Take 1 tablet by mouth 2 (two) times daily. 180 tablet 1   traZODone (DESYREL) 50 MG tablet Take 1-2 tablets (50-100 mg total) by mouth at bedtime as needed for sleep. 60 tablet 2   VITAMIN D PO Take by mouth daily.     No current facility-administered medications for this visit.      Marland Kitchen  PHYSICAL EXAMINATION: ECOG PERFORMANCE STATUS: 1 - Symptomatic but completely ambulatory  There were no vitals filed for this visit.  There were no vitals filed for this visit.   Physical Exam Constitutional:      Appearance: Normal appearance.  HENT:     Head: Normocephalic and atraumatic.  Eyes:     Pupils: Pupils are equal, round, and reactive to light.  Cardiovascular:     Rate and Rhythm: Normal rate and regular rhythm.     Heart sounds: Normal  heart sounds. No murmur heard. Pulmonary:     Effort: Pulmonary effort is normal.     Breath sounds: Normal breath sounds. No wheezing.  Abdominal:     General: Bowel sounds are normal. There is no distension.     Palpations: Abdomen is soft.     Tenderness: There is no abdominal tenderness.  Musculoskeletal:        General: Normal range of motion.     Cervical back: Normal range of motion.  Skin:    General: Skin is warm and dry.     Findings: No rash.  Neurological:     Mental Status: She is alert and oriented to person, place, and time.     Gait: Gait is intact.  Psychiatric:        Mood and Affect: Mood and affect normal.        Cognition and Memory: Memory normal.        Judgment: Judgment normal.     LABORATORY DATA:  I have reviewed the data as listed Lab Results  Component Value Date   WBC 3.1 (L) 05/08/2021   HGB 11.1 (L) 05/08/2021   HCT 34.4 (L) 05/08/2021   MCV 90.5 05/08/2021   PLT 129 (L) 05/08/2021   Recent Labs    09/11/20 1141 10/02/20 1224 10/28/20 1358 12/16/20 1111 02/12/21 0925 05/08/21 1333  NA 134 136 141 141 139 139  K 3.7 3.9 4.1 3.5 3.7 3.4*  CL 94* 98 103 101 101 103  CO2 '26 20 25 23 24 26  '$ GLUCOSE 105* 107* 98 98 93 111*  BUN 27 35* '26 18 24 '$ 28*  CREATININE 0.75 1.17* 0.93 0.80 0.78 0.96  CALCIUM 9.3 9.3 9.7 9.3 9.5 9.3  GFRNONAA 86 50* 66  --   --  >60  GFRAA 99 58* 76  --   --   --   PROT  --   --   --   --  7.1  --   ALBUMIN  --   --   --   --  4.0  --   AST  --   --   --   --  20  --   ALT  --   --   --   --  26  --   ALKPHOS  --   --   --   --  92  --   BILITOT  --   --   --   --  0.5  --      RADIOGRAPHIC STUDIES: I have personally reviewed the radiological images as listed and agreed with the findings in the report. No results found.   ASSESSMENT & PLAN:  Anemia- Unclear etiology.  She has not received IV iron since 2018.  Labs from today show improvement of her hemoglobin to 11.1 although her ferritin and iron  panel are pending.  Previous ferritin is from 02/12/2021 which was 163 with iron saturations of 17%.  No additional IV iron today.  Recommend she continue oral iron since she is tolerating well.  She is currently taking one 325 mg iron tablet daily.   Leukopenia- This is been intermittent for several years now..  Labs from today show a white count of 3.1.  She has had recurrent urinary tract infections.  No recent fevers or hospitalizations.  Thrombocytopenia- Platelet count is 129,000 today.  Unclear etiology but will continue to monitor.  Recurrent UTI- She has recently been referred to urology for four UTIs this past year.  States she was called in an antibiotic that she is to pick up today.  Reports hematuria for the past 4 to 5 days.   Disposition- Return to clinic in 6 months with repeat lab work (CBC, CMP, iron and ferritin level), see Dr. Rogue Bussing and possible IV Venofer.  I spent 25 minutes dedicated to the care of this patient (face-to-face and non-face-to-face) on the date of the encounter to include what is described in the assessment and plan.  No problem-specific Assessment & Plan notes found for this encounter.  All questions were answered. The patient knows to call the clinic with any problems, questions or concerns.     Kristina Hawking, NP 05/08/2021 1:54 PM

## 2021-05-08 NOTE — Progress Notes (Signed)
Patient denies new problems/concerns today.   °

## 2021-05-09 LAB — URINE CULTURE

## 2021-05-26 NOTE — Progress Notes (Signed)
05/27/2021 5:14 PM   [name] 03/28/1956 [id number]  Referring provider: Valerie Roys, DO [place],  [place] [address]  Urological history: 1. rUTI -Contributing factors of age, vaginal atrophy and poor perineal hygiene -documented positive urine cultures over the last year  01/16/2021 Klebsiella pneumoniae resistant to ampicillin  11/24/2020 Klebsiella pneumoniae resistant to ampicillin  2. Vaginal atrophy -not using vaginal estrogen cream   Chief Complaint  Patient presents with   Hematuria   Recurrent UTI    HPI: Kristina Henson is a 65 y.o. female who presents today for gross hematuria and rUTI's  She had gross heme in 04/2021 with a negative urine culture.  She states she has been having symptoms of low back pain, frequency and gross hematuria.  She is asymptomatic at this appointment.  Patient denies any modifying or aggravating factors.  Patient denies any dysuria or suprapubic/flank pain.  Patient denies any fevers, chills, nausea or vomiting.    UA 11-30 WBCs and many bacteria.  PMH: Past Medical History:  Diagnosis Date   Anemia    Arthritis    BACK-LUMBAR   Atrial fibrillation ([place])    Coronary artery disease    Duodenitis    Dysrhythmia    Gastritis    Hypertension    Mass in neck    lt side   Pneumonia 2016   Stroke [facility]) 2012   Stroke [facility]) 2012    Surgical History: Past Surgical History:  Procedure Laterality Date   cardiac catherization     COLONOSCOPY WITH PROPOFOL N/A 11/18/2015   Procedure: COLONOSCOPY WITH PROPOFOL;  Surgeon: Lucilla Lame, MD;  Location: [facility] ENDOSCOPY;  Service: Endoscopy;  Laterality: N/A;   COLONOSCOPY WITH PROPOFOL N/A 12/02/2020   Procedure: COLONOSCOPY WITH PROPOFOL;  Surgeon: Lucilla Lame, MD;  Location: [facility] ENDOSCOPY;  Service: Endoscopy;  Laterality: N/A;   CORONARY ANGIOPLASTY     ESOPHAGOGASTRODUODENOSCOPY (EGD) WITH PROPOFOL N/A 04/23/2016   Procedure: ESOPHAGOGASTRODUODENOSCOPY (EGD)  WITH PROPOFOL;  Surgeon: Lucilla Lame, MD;  Location: [place];  Service: Endoscopy;  Laterality: N/A;  small bowel bx gastric antrum bx   SUBMANDIBULAR GLAND EXCISION Left 09/08/2017   Procedure: EXCISION SUBMANDIBULAR GLAND;  Surgeon: Carloyn Manner, MD;  Location: [facility] ORS;  Service: ENT;  Laterality: Left;   throat biopsy     TOTAL ABDOMINAL HYSTERECTOMY W/ BILATERAL SALPINGOOPHORECTOMY     Total    Home Medications:  Allergies as of 05/27/2021       Reactions   Amoxicillin Other (See Comments)   Joint pains        Medication List        Accurate as of May 27, 2021 11:59 PM. If you have any questions, ask your nurse or doctor.          STOP taking these medications    ciprofloxacin 500 MG tablet Commonly known as: Cipro Stopped by: Zara Council, PA-C       TAKE these medications    amiodarone 200 MG tablet Commonly known as: PACERONE Take 1 tablet (200 mg total) by mouth every morning.   amLODipine 5 MG tablet Commonly known as: NORVASC Take 1 tablet (5 mg total) by mouth daily.   aspirin EC 81 MG tablet Take 81 mg by mouth daily.   baclofen 10 MG tablet Commonly known as: LIORESAL Take 1 tablet (10 mg total) by mouth at bedtime as needed for muscle spasms.   CRANBERRY PO Take by mouth.  ferrous sulfate 325 (65 FE) MG EC tablet Take 1 tablet (325 mg total) by mouth 3 (three) times daily with meals.   hydrALAZINE 50 MG tablet Commonly known as: APRESOLINE Take 1 tablet (50 mg total) by mouth 2 (two) times daily.   lisinopril-hydrochlorothiazide 20-25 MG tablet Commonly known as: ZESTORETIC Take 1 tablet by mouth 2 (two) times daily.   traZODone 50 MG tablet Commonly known as: DESYREL Take 1-2 tablets (50-100 mg total) by mouth at bedtime as needed for sleep.   vitamin C 100 MG tablet Take 100 mg by mouth daily.   VITAMIN D PO Take by mouth daily.        Allergies:  Allergies  Allergen Reactions   Amoxicillin  Other (See Comments)    Joint pains    Family History: Family History  Problem Relation Age of Onset   Arthritis Mother    Cancer Mother    Hypertension Sister    Asthma Brother    Dementia Brother    Breast cancer Neg Hx    Kidney cancer Neg Hx    Bladder Cancer Neg Hx     Social History:  reports that she quit smoking about 44 years ago. Her smoking use included cigarettes. She has never used smokeless tobacco. She reports current alcohol use of about 1.0 standard drink per week. She reports that she does not use drugs.  ROS: Pertinent ROS in HPI  Physical Exam: BP 117/78   Pulse (!) 102   Ht '5\' 2"'$  (1.575 m)   Wt 163 lb (73.9 kg)   BMI 29.81 kg/m   Constitutional:  Well nourished. Alert and oriented, No acute distress. HEENT: [place] AT, mask in place.  Trachea midline Cardiovascular: No clubbing, cyanosis, or edema. Respiratory: Normal respiratory effort, no increased work of breathing. Neurologic: Grossly intact, no focal deficits, moving all 4 extremities. Psychiatric: Normal mood and affect.    Laboratory Data: Lab Results  Component Value Date   WBC 3.1 (L) 05/08/2021   HGB 11.1 (L) 05/08/2021   HCT 34.4 (L) 05/08/2021   MCV 90.5 05/08/2021   PLT 129 (L) 05/08/2021    Lab Results  Component Value Date   CREATININE 0.96 05/08/2021       Component Value Date/Time   CHOL 172 02/12/2021 0925   HDL 48 02/12/2021 0925   LDLCALC 100 (H) 02/12/2021 0925    Lab Results  Component Value Date   AST 20 02/12/2021   Lab Results  Component Value Date   ALT 26 02/12/2021    Urinalysis Component     Latest Ref Rng & Units 05/27/2021  Specific Gravity, UA     1.005 - 1.030 1.020  pH, UA     5.0 - 7.5 5.5  Color, UA     Yellow Yellow  Appearance Ur     Clear Cloudy (A)  Leukocytes,UA     Negative 2+ (A)  Protein,UA     Negative/Trace Negative  Glucose, UA     Negative Negative  Ketones, UA     Negative Negative  RBC, UA     Negative Negative   Bilirubin, UA     Negative Negative  Urobilinogen, Ur     0.2 - 1.0 mg/dL 0.2  Nitrite, UA     Negative Negative  Microscopic Examination      See below:   Component     Latest Ref Rng & Units 05/27/2021          WBC, UA  0 - 5 /hpf 11-30 (A)  RBC     0 - 2 /hpf 0-2  Epithelial Cells (non renal)     0 - 10 /hpf 0-10  Bacteria, UA     None seen/Few Many (A)  I have reviewed the labs.   Pertinent Imaging: N/A    Assessment & Plan:    1. Gross hematuria - I explained to the patient that there are a number of causes that can be associated with blood in the urine, such as stones, UTI's, damage to the urinary tract and/or cancer. - AUA stratification: High risk  -Will need CT urogram and cystoscopy to complete work-up - I explained to the patient that a contrast material will be injected into a vein and that in rare instances, an allergic reaction can result and may even life threatening (1:100,000)  The patient denies any allergies to contrast, iodine and/or seafood and is not taking metformin. - Following the imaging study,  I've recommended a cystoscopy. I described how this is performed, typically in an office setting with a flexible cystoscope. We described the risks, benefits, and possible side effects, the most common of which is a minor amount of blood in the urine and/or burning which usually resolves in 24 to 48 hours.   - The patient had the opportunity to ask questions which were answered. Based upon this discussion, the patient is willing to proceed. Therefore, I've ordered: a CT Urogram and cystoscopy. - The patient will return following all of the above for discussion of the results.  - UA - Urine culture in anticipation for instrumentation  2. rUTI's -Explained to the patient that some her urine cultures returned back negative for infection, so if no etiology is found for her symptoms of low back pain, frequency and gross hematuria on CT urogram or cystoscopy  this will need to be reassessed at a different appointment     Return for CT Urogram report and cystoscopy.  These notes generated with voice recognition software. I apologize for typographical errors.  Zara Council, PA-C  [facility] [address]  [place], [place] [address] [phone]

## 2021-05-27 ENCOUNTER — Ambulatory Visit (INDEPENDENT_AMBULATORY_CARE_PROVIDER_SITE_OTHER): Payer: Medicare Other | Admitting: Urology

## 2021-05-27 ENCOUNTER — Encounter: Payer: Self-pay | Admitting: Urology

## 2021-05-27 ENCOUNTER — Other Ambulatory Visit: Payer: Self-pay

## 2021-05-27 VITALS — BP 117/78 | HR 102 | Ht 62.0 in | Wt 163.0 lb

## 2021-05-27 DIAGNOSIS — N390 Urinary tract infection, site not specified: Secondary | ICD-10-CM | POA: Diagnosis not present

## 2021-05-27 DIAGNOSIS — R310 Gross hematuria: Secondary | ICD-10-CM

## 2021-05-29 LAB — MICROSCOPIC EXAMINATION

## 2021-05-29 LAB — URINALYSIS, COMPLETE
Bilirubin, UA: NEGATIVE
Glucose, UA: NEGATIVE
Ketones, UA: NEGATIVE
Nitrite, UA: NEGATIVE
Protein,UA: NEGATIVE
RBC, UA: NEGATIVE
Specific Gravity, UA: 1.02 (ref 1.005–1.030)
Urobilinogen, Ur: 0.2 mg/dL (ref 0.2–1.0)
pH, UA: 5.5 (ref 5.0–7.5)

## 2021-05-31 LAB — CULTURE, URINE COMPREHENSIVE

## 2021-06-02 ENCOUNTER — Other Ambulatory Visit: Payer: Self-pay

## 2021-06-02 ENCOUNTER — Other Ambulatory Visit
Admission: RE | Admit: 2021-06-02 | Discharge: 2021-06-02 | Disposition: A | Discharge status: Home / Self Care | Payer: Medicare Other | Source: Ambulatory Visit | Attending: Nurse Practitioner | Admitting: Nurse Practitioner

## 2021-06-02 DIAGNOSIS — Z20822 Contact with and (suspected) exposure to covid-19: Secondary | ICD-10-CM | POA: Insufficient documentation

## 2021-06-02 DIAGNOSIS — Z01812 Encounter for preprocedural laboratory examination: Secondary | ICD-10-CM | POA: Insufficient documentation

## 2021-06-02 LAB — SARS CORONAVIRUS 2 (TAT 6-24 HRS): SARS Coronavirus 2: NEGATIVE

## 2021-06-03 ENCOUNTER — Other Ambulatory Visit: Payer: Medicare Other

## 2021-06-04 ENCOUNTER — Ambulatory Visit: Payer: Medicare Other | Attending: Neurology

## 2021-06-04 DIAGNOSIS — F5101 Primary insomnia: Secondary | ICD-10-CM | POA: Insufficient documentation

## 2021-06-04 DIAGNOSIS — G4733 Obstructive sleep apnea (adult) (pediatric): Secondary | ICD-10-CM | POA: Insufficient documentation

## 2021-06-08 ENCOUNTER — Other Ambulatory Visit: Payer: Self-pay

## 2021-06-12 ENCOUNTER — Encounter: Payer: Self-pay | Admitting: Nurse Practitioner

## 2021-06-12 ENCOUNTER — Ambulatory Visit (INDEPENDENT_AMBULATORY_CARE_PROVIDER_SITE_OTHER): Payer: Medicare Other | Admitting: Nurse Practitioner

## 2021-06-12 ENCOUNTER — Other Ambulatory Visit: Payer: Self-pay

## 2021-06-12 VITALS — BP 135/83 | HR 89 | Temp 98.9°F | Wt 166.6 lb

## 2021-06-12 DIAGNOSIS — R350 Frequency of micturition: Secondary | ICD-10-CM | POA: Diagnosis not present

## 2021-06-12 DIAGNOSIS — N3090 Cystitis, unspecified without hematuria: Secondary | ICD-10-CM | POA: Diagnosis not present

## 2021-06-12 LAB — URINALYSIS, ROUTINE W REFLEX MICROSCOPIC
Bilirubin, UA: NEGATIVE
Glucose, UA: NEGATIVE
Ketones, UA: NEGATIVE
Nitrite, UA: NEGATIVE
Protein,UA: NEGATIVE
RBC, UA: NEGATIVE
Specific Gravity, UA: 1.02 (ref 1.005–1.030)
Urobilinogen, Ur: 0.2 mg/dL (ref 0.2–1.0)
pH, UA: 5 (ref 5.0–7.5)

## 2021-06-12 LAB — MICROSCOPIC EXAMINATION

## 2021-06-12 MED ORDER — SULFAMETHOXAZOLE-TRIMETHOPRIM 800-160 MG PO TABS: 1.0000 | ORAL_TABLET | Freq: Two times a day (BID) | ORAL | 0 refills | Status: DC

## 2021-06-12 NOTE — Progress Notes (Signed)
Acute Office Visit  Subjective:    Patient ID: Kristina Henson, female    DOB: Jan 02, 1956, 65 y.o.   MRN: [id number]  Chief Complaint  Patient presents with   Urinary Tract Infection    Pt states she has been having urinary frequency and low back pain for the last few days. States she just got over a UTI not to long ago but feels it has come back.     HPI Patient is in today for UTI symptoms. She has been treated for several UTIs recently. She was referred to urology and needs to have a CT scan along with another follow-up.   URINARY SYMPTOMS  Dysuria: no Urinary frequency: yes Urgency: yes Small volume voids:  varies Symptom severity:  moderate Urinary incontinence: no Foul odor: yes Hematuria: yes Abdominal pain: no Back pain: yes Suprapubic pain/pressure: no Flank pain: no Fever:  no Vomiting: no Relief with cranberry juice: no Relief with pyridium:  n/a Status: worse Previous urinary tract infection: yes Recurrent urinary tract infection: yes Treatments attempted: antibiotics, cranberry, and increasing fluids    Past Medical History:  Diagnosis Date   Anemia    Arthritis    BACK-LUMBAR   Atrial fibrillation (HCC)    Coronary artery disease    Duodenitis    Dysrhythmia    Gastritis    Hypertension    Mass in neck    lt side   Pneumonia 2016   Stroke ([place]) 2012   Stroke [facility]) 2012    Past Surgical History:  Procedure Laterality Date   cardiac catherization     COLONOSCOPY WITH PROPOFOL N/A 11/18/2015   Procedure: COLONOSCOPY WITH PROPOFOL;  Surgeon: Lucilla Lame, MD;  Location: [facility] ENDOSCOPY;  Service: Endoscopy;  Laterality: N/A;   COLONOSCOPY WITH PROPOFOL N/A 12/02/2020   Procedure: COLONOSCOPY WITH PROPOFOL;  Surgeon: Lucilla Lame, MD;  Location: [facility] ENDOSCOPY;  Service: Endoscopy;  Laterality: N/A;   CORONARY ANGIOPLASTY     ESOPHAGOGASTRODUODENOSCOPY (EGD) WITH PROPOFOL N/A 04/23/2016   Procedure: ESOPHAGOGASTRODUODENOSCOPY (EGD) WITH  PROPOFOL;  Surgeon: Lucilla Lame, MD;  Location: [place];  Service: Endoscopy;  Laterality: N/A;  small bowel bx gastric antrum bx   SUBMANDIBULAR GLAND EXCISION Left 09/08/2017   Procedure: EXCISION SUBMANDIBULAR GLAND;  Surgeon: Carloyn Manner, MD;  Location: [facility] ORS;  Service: ENT;  Laterality: Left;   throat biopsy     TOTAL ABDOMINAL HYSTERECTOMY W/ BILATERAL SALPINGOOPHORECTOMY     Total    Family History  Problem Relation Age of Onset   Arthritis Mother    Cancer Mother    Hypertension Sister    Asthma Brother    Dementia Brother    Breast cancer Neg Hx    Kidney cancer Neg Hx    Bladder Cancer Neg Hx     Social History   Socioeconomic History   Marital status: Divorced    Spouse name: Not on file   Number of children: Not on file   Years of education: 12   Highest education level: 12th grade  Occupational History   Occupation: disability   Tobacco Use   Smoking status: Former    Types: Cigarettes    Quit date: 1978    Years since quitting: 44.7   Smokeless tobacco: Never   Tobacco comments:    didnt smoke much   Vaping Use   Vaping Use: Never used  Substance and Sexual Activity   Alcohol use: Yes    Alcohol/week: 1.0 standard drink  Types: 1 Glasses of wine per week    Comment: occasionally   Drug use: No   Sexual activity: Not Currently  Other Topics Concern   Not on file  Social History Narrative   Not on file   Social Determinants of Health   Financial Resource Strain: Low Risk    Difficulty of Paying Living Expenses: Not hard at all  Food Insecurity: No Food Insecurity   Worried About Charity fundraiser in the Last Year: Never true   [place] in the Last Year: Never true  Transportation Needs: No Transportation Needs   Lack of Transportation (Medical): No   Lack of Transportation (Non-Medical): No  Physical Activity: Inactive   Days of Exercise per Week: 0 days   Minutes of Exercise per Session: 0 min  Stress: No  Stress Concern Present   Feeling of Stress : Not at all  Social Connections: Not on file  Intimate Partner Violence: Not on file    Outpatient Medications Prior to Visit  Medication Sig Dispense Refill   amiodarone (PACERONE) 200 MG tablet Take 1 tablet (200 mg total) by mouth every morning. 30 tablet 0   amLODipine (NORVASC) 5 MG tablet Take 1 tablet (5 mg total) by mouth daily. 90 tablet 1   Ascorbic Acid (VITAMIN C) 100 MG tablet Take 100 mg by mouth daily.     aspirin EC 81 MG tablet Take 81 mg by mouth daily.      baclofen (LIORESAL) 10 MG tablet Take 1 tablet (10 mg total) by mouth at bedtime as needed for muscle spasms. 30 each 3   CRANBERRY PO Take by mouth.     ferrous sulfate 325 (65 FE) MG EC tablet Take 1 tablet (325 mg total) by mouth 3 (three) times daily with meals. 270 tablet 1   hydrALAZINE (APRESOLINE) 50 MG tablet Take 1 tablet (50 mg total) by mouth 2 (two) times daily. 180 tablet 1   lisinopril-hydrochlorothiazide (ZESTORETIC) 20-25 MG tablet Take 1 tablet by mouth 2 (two) times daily. 180 tablet 1   traZODone (DESYREL) 50 MG tablet Take 1-2 tablets (50-100 mg total) by mouth at bedtime as needed for sleep. 60 tablet 2   VITAMIN D PO Take by mouth daily.     No facility-administered medications prior to visit.    Allergies  Allergen Reactions   Amoxicillin Other (See Comments)    Joint pains    Review of Systems  Constitutional:  Positive for fatigue.  Respiratory: Negative.    Cardiovascular: Negative.   Gastrointestinal: Negative.   Genitourinary:  Positive for dysuria, frequency and hematuria.  Musculoskeletal:  Positive for back pain.  Skin: Negative.   Neurological: Negative.       Objective:    Physical Exam Vitals and nursing note reviewed.  Constitutional:      General: She is not in acute distress.    Appearance: Normal appearance.  HENT:     Head: Normocephalic.  Eyes:     Conjunctiva/sclera: Conjunctivae normal.  Cardiovascular:      Rate and Rhythm: Normal rate and regular rhythm.     Pulses: Normal pulses.     Heart sounds: Normal heart sounds.  Pulmonary:     Effort: Pulmonary effort is normal.     Breath sounds: Normal breath sounds.  Abdominal:     Palpations: Abdomen is soft.     Tenderness: There is no abdominal tenderness. There is no right CVA tenderness or left CVA tenderness.  Musculoskeletal:     Cervical back: Normal range of motion.  Skin:    General: Skin is warm.  Neurological:     General: No focal deficit present.     Mental Status: She is alert and oriented to person, place, and time.  Psychiatric:        Mood and Affect: Mood normal.        Behavior: Behavior normal.        Thought Content: Thought content normal.        Judgment: Judgment normal.    BP 135/83   Pulse 89   Temp 98.9 F (37.2 C) (Oral)   Wt 166 lb 9.6 oz (75.6 kg)   SpO2 98%   BMI 30.47 kg/m  Wt Readings from Last 3 Encounters:  06/12/21 166 lb 9.6 oz (75.6 kg)  05/27/21 163 lb (73.9 kg)  05/08/21 166 lb 12.8 oz (75.7 kg)    Health Maintenance Due  Topic Date Due   Zoster Vaccines- Shingrix (1 of 2) Never done   COVID-19 Vaccine (3 - Booster for Moderna series) 05/14/2020   INFLUENZA VACCINE  04/27/2021    There are no preventive care reminders to display for this patient.   Lab Results  Component Value Date   TSH 0.929 11/02/2019   Lab Results  Component Value Date   WBC 3.1 (L) 05/08/2021   HGB 11.1 (L) 05/08/2021   HCT 34.4 (L) 05/08/2021   MCV 90.5 05/08/2021   PLT 129 (L) 05/08/2021   Lab Results  Component Value Date   NA 139 05/08/2021   K 3.4 (L) 05/08/2021   CO2 26 05/08/2021   GLUCOSE 111 (H) 05/08/2021   BUN 28 (H) 05/08/2021   CREATININE 0.96 05/08/2021   BILITOT 0.5 02/12/2021   ALKPHOS 92 02/12/2021   AST 20 02/12/2021   ALT 26 02/12/2021   PROT 7.1 02/12/2021   ALBUMIN 4.0 02/12/2021   CALCIUM 9.3 05/08/2021   ANIONGAP 10 05/08/2021   EGFR 85 02/12/2021   Lab Results   Component Value Date   CHOL 172 02/12/2021   Lab Results  Component Value Date   HDL 48 02/12/2021   Lab Results  Component Value Date   LDLCALC 100 (H) 02/12/2021   Lab Results  Component Value Date   TRIG 135 02/12/2021   No results found for: CHOLHDL No results found for: HGBA1C     Assessment & Plan:   Problem List Items Addressed This Visit   None Visit Diagnoses     Urinary frequency    -  Primary   U/A positive for many bacteria, and leukocytes   Relevant Orders   Urinalysis, Routine w reflex microscopic (Completed)   Cystitis       Recently treated with cipro. Will treat this time with bactrim. Check culture. Keep f/u and CT scan with urology.    Relevant Orders   Urine Culture        Meds ordered this encounter  Medications   sulfamethoxazole-trimethoprim (BACTRIM DS) 800-160 MG tablet    Sig: Take 1 tablet by mouth 2 (two) times daily.    Dispense:  14 tablet    Refill:  0     Charyl Dancer, NP

## 2021-06-17 ENCOUNTER — Other Ambulatory Visit: Payer: Medicare Other | Admitting: Urology

## 2021-06-21 LAB — URINE CULTURE

## 2021-06-22 ENCOUNTER — Ambulatory Visit
Admission: RE | Admit: 2021-06-22 | Discharge: 2021-06-22 | Disposition: A | Discharge status: Home / Self Care | Payer: Medicare Other | Source: Ambulatory Visit | Attending: Urology | Admitting: Urology

## 2021-06-22 ENCOUNTER — Other Ambulatory Visit: Payer: Self-pay

## 2021-06-22 DIAGNOSIS — R310 Gross hematuria: Secondary | ICD-10-CM | POA: Diagnosis not present

## 2021-06-22 DIAGNOSIS — K8020 Calculus of gallbladder without cholecystitis without obstruction: Secondary | ICD-10-CM | POA: Diagnosis not present

## 2021-06-22 DIAGNOSIS — K769 Liver disease, unspecified: Secondary | ICD-10-CM | POA: Diagnosis not present

## 2021-06-22 DIAGNOSIS — K449 Diaphragmatic hernia without obstruction or gangrene: Secondary | ICD-10-CM | POA: Diagnosis not present

## 2021-06-22 LAB — POCT I-STAT CREATININE: Creatinine, Ser: 1.2 mg/dL — ABNORMAL HIGH (ref 0.44–1.00)

## 2021-06-22 MED ORDER — IOHEXOL 350 MG/ML SOLN
100.0000 mL | Freq: Once | INTRAVENOUS | Status: AC | PRN
  Administered 2021-06-22: 100 mL via INTRAVENOUS

## 2021-06-23 NOTE — Progress Notes (Signed)
   06/24/21  CC:  Chief Complaint  Patient presents with   Cysto     HPI: Kristina Henson is a 65 y.o.female with a personal history of gross hematuria, recurrent UTI and vaginal atrophy, who presents today for a cystoscopy.   She has two documented UTIs this year one on 11/24/2020 which grew Klebsiella pneumoniae resistant to ampicillin and another on 01/16/2021 which grew Klebsiella pneumoniae resistant to ampicillin   She has gross hematuria in 04/2021 with a negative urine culture.   Recent UA on 06/01/2021 revealed 11-30 WBCs and many bacteria.   Urine a >30 WBCs, with some bacteria. She was treated with antibiotics on 06/12/2021 for a UTI which grew lactobacillus species. She admits today to not wiping so will proceed with cystoscopy.    Vitals:   06/24/21 1115  BP: 133/89  Pulse: 91  NED. A&Ox3.   No respiratory distress   Abd soft, NT, ND Normal external genitalia with patent urethral meatus  Cystoscopy Procedure Note  Patient identification was confirmed, informed consent was obtained, and patient was prepped using Betadine solution.  Lidocaine jelly was administered per urethral meatus.    Procedure: - Flexible cystoscope introduced, without any difficulty.   - Thorough search of the bladder revealed:    normal urethral meatus    normal urothelium    Mild edema at bladder neck and inflammation along trigone which was a little more pronounced than normal    no stones    no ulcers     no tumors    no urethral polyps    no trabeculation  - Ureteral orifices were normal in position and appearance.  Post-Procedure: - Patient tolerated the procedure well  Assessment/ Plan:  Trigonitis/ bladder inflammation  - send urine culture drawn from time of cysto to rule out underlying infections. Will treat as needed.  - Urine sent for cytology  - Due to degree of inflammation that is fairly focal would like to repeat cystoscopy in 6 months to rule out need for  bladder biopsy.    I,Kailey Littlejohn,acting as a Education administrator for Hollice Espy, MD.,have documented all relevant documentation on the behalf of Hollice Espy, MD,as directed by  Hollice Espy, MD while in the presence of Hollice Espy, MD.   I have reviewed the above documentation for accuracy and completeness, and I agree with the above.   Hollice Espy, MD

## 2021-06-24 ENCOUNTER — Ambulatory Visit (INDEPENDENT_AMBULATORY_CARE_PROVIDER_SITE_OTHER): Payer: Medicare Other | Admitting: Urology

## 2021-06-24 ENCOUNTER — Other Ambulatory Visit: Payer: Self-pay

## 2021-06-24 VITALS — BP 133/89 | HR 91 | Ht 62.0 in | Wt 166.6 lb

## 2021-06-24 DIAGNOSIS — R310 Gross hematuria: Secondary | ICD-10-CM

## 2021-06-25 LAB — CYTOLOGY - NON PAP

## 2021-06-25 LAB — URINALYSIS, COMPLETE
Bilirubin, UA: NEGATIVE
Glucose, UA: NEGATIVE
Ketones, UA: NEGATIVE
Nitrite, UA: NEGATIVE
Protein,UA: NEGATIVE
RBC, UA: NEGATIVE
Specific Gravity, UA: 1.02 (ref 1.005–1.030)
Urobilinogen, Ur: 0.2 mg/dL (ref 0.2–1.0)
pH, UA: 6.5 (ref 5.0–7.5)

## 2021-06-25 LAB — MICROSCOPIC EXAMINATION: WBC, UA: >30 /hpf — AB (ref 0–5)

## 2021-07-03 ENCOUNTER — Ambulatory Visit: Payer: Self-pay

## 2021-07-03 NOTE — Telephone Encounter (Signed)
Patient called and says she has passed blood in her urine twice, last night and today. She says the blood is red in color, no clots. She reports no pain with urination, but has lower back pain around her flank at a 4-5. She says she is not running a fever, no other symptoms. She reports frequency. I advised no appointments this late in the day, she says she will see Dr. Wynetta Emery next week. Earliest appointment is on Tuesday at 0800, advised that's the only appointment, asked if she wants to see another provider, she says no. She says she will have to ride the bus to the appointment and if they are not able to bring her she will have to reschedule. I advised to call back as soon as she knows. Care advice given, patient verbalized understanding.   Reason for Disposition  Blood in urine  (Exception: could be normal menstrual bleeding)  Answer Assessment - Initial Assessment Questions 1. COLOR of URINE: "Describe the color of the urine."  (e.g., tea-colored, pink, red, blood clots, bloody)     Red, no clots 2. ONSET: "When did the bleeding start?"      Last night and again today 3. EPISODES: "How many times has there been blood in the urine?" or "How many times today?"     2 times so far 4. PAIN with URINATION: "Is there any pain with passing your urine?" If Yes, ask: "How bad is the pain?"  (Scale 1-10; or mild, moderate, severe)    - MILD - complains slightly about urination hurting    - MODERATE - interferes with normal activities      - SEVERE - excruciating, unwilling or unable to urinate because of the pain      No pain with urination, but pain to the lower back at a 4-5 5. FEVER: "Do you have a fever?" If Yes, ask: "What is your temperature, how was it measured, and when did it start?"     No 6. ASSOCIATED SYMPTOMS: "Are you passing urine more frequently than usual?"     Yes 7. OTHER SYMPTOMS: "Do you have any other symptoms?" (e.g., back/flank pain, abdominal pain, vomiting)     Back  pain 8. PREGNANCY: "Is there any chance you are pregnant?" "When was your last menstrual period?"     No  Protocols used: Urine - Blood In-A-AH

## 2021-07-04 LAB — CULTURE, URINE COMPREHENSIVE

## 2021-07-06 NOTE — Telephone Encounter (Signed)
Pt requested to see Dr. Wynetta Emery, she has an appt tomorrow at 8 am

## 2021-07-07 ENCOUNTER — Ambulatory Visit (INDEPENDENT_AMBULATORY_CARE_PROVIDER_SITE_OTHER): Payer: Medicare Other | Admitting: Family Medicine

## 2021-07-07 ENCOUNTER — Other Ambulatory Visit: Payer: Self-pay

## 2021-07-07 ENCOUNTER — Encounter: Payer: Self-pay | Admitting: Family Medicine

## 2021-07-07 VITALS — BP 124/83 | HR 94 | Ht 62.0 in | Wt 166.0 lb

## 2021-07-07 DIAGNOSIS — R8281 Pyuria: Secondary | ICD-10-CM

## 2021-07-07 DIAGNOSIS — R300 Dysuria: Secondary | ICD-10-CM | POA: Diagnosis not present

## 2021-07-07 LAB — MICROSCOPIC EXAMINATION: RBC, Urine: NONE SEEN /hpf (ref 0–2)

## 2021-07-07 LAB — URINALYSIS, ROUTINE W REFLEX MICROSCOPIC
Bilirubin, UA: NEGATIVE
Glucose, UA: NEGATIVE
Ketones, UA: NEGATIVE
Nitrite, UA: NEGATIVE
Protein,UA: NEGATIVE
RBC, UA: NEGATIVE
Specific Gravity, UA: 1.01 (ref 1.005–1.030)
Urobilinogen, Ur: 0.2 mg/dL (ref 0.2–1.0)
pH, UA: 5 (ref 5.0–7.5)

## 2021-07-07 MED ORDER — NITROFURANTOIN MONOHYD MACRO 100 MG PO CAPS: 100.0000 mg | ORAL_CAPSULE | Freq: Two times a day (BID) | ORAL | 0 refills | Status: DC

## 2021-07-07 MED ORDER — BACLOFEN 10 MG PO TABS: 10.0000 mg | ORAL_TABLET | Freq: Every evening | ORAL | 3 refills | Status: DC | PRN

## 2021-07-07 NOTE — Progress Notes (Signed)
BP 124/83   Pulse 94   Ht 5\' 2"  (1.575 m)   Wt 166 lb (75.3 kg)   BMI 30.36 kg/m    Subjective:    Patient ID: Kristina Henson, female    DOB: December 16, 1955, 65 y.o.   MRN: [id number]  HPI: Kristina Henson is a 65 y.o. female  Chief Complaint  Patient presents with   Hematuria   URINARY SYMPTOMS- has been following with urology. She has been having hematuria for over a month, but it has been significantly worse over the past 4 days Duration: 4 days Dysuria: yes Urinary frequency: yes Urgency: yes Small volume voids: no Symptom severity:  moderate Urinary incontinence: no Foul odor: no Hematuria: yes Abdominal pain: on Sunday only Back pain: no Suprapubic pain/pressure: no Flank pain: no Fever:  no Vomiting: no Relief with cranberry juice: no Relief with pyridium: no Status: better Previous urinary tract infection: yes Recurrent urinary tract infection: yes Sexual activity: No sexually active History of sexually transmitted disease: no Vaginal discharge: no Treatments attempted: pyridium, cranberry, and increasing fluids   Relevant past medical, surgical, family and social history reviewed and updated as indicated. Interim medical history since our last visit reviewed. Allergies and medications reviewed and updated.  Review of Systems  Constitutional: Negative.   Respiratory: Negative.    Cardiovascular: Negative.   Gastrointestinal: Negative.   Genitourinary: Negative.   Musculoskeletal: Negative.   Psychiatric/Behavioral: Negative.     Per HPI unless specifically indicated above     Objective:    BP 124/83   Pulse 94   Ht 5\' 2"  (1.575 m)   Wt 166 lb (75.3 kg)   BMI 30.36 kg/m   Wt Readings from Last 3 Encounters:  07/07/21 166 lb (75.3 kg)  06/24/21 166 lb 9.6 oz (75.6 kg)  06/12/21 166 lb 9.6 oz (75.6 kg)    Physical Exam Vitals and nursing note reviewed.  Constitutional:      General: She is not in acute distress.    Appearance: Normal  appearance. She is not ill-appearing, toxic-appearing or diaphoretic.  HENT:     Head: Normocephalic and atraumatic.     Right Ear: External ear normal.     Left Ear: External ear normal.     Nose: Nose normal.     Mouth/Throat:     Mouth: Mucous membranes are moist.     Pharynx: Oropharynx is clear.  Eyes:     General: No scleral icterus.       Right eye: No discharge.        Left eye: No discharge.     Extraocular Movements: Extraocular movements intact.     Conjunctiva/sclera: Conjunctivae normal.     Pupils: Pupils are equal, round, and reactive to light.  Cardiovascular:     Rate and Rhythm: Normal rate and regular rhythm.     Pulses: Normal pulses.     Heart sounds: Normal heart sounds. No murmur heard.   No friction rub. No gallop.  Pulmonary:     Effort: Pulmonary effort is normal. No respiratory distress.     Breath sounds: Normal breath sounds. No stridor. No wheezing, rhonchi or rales.  Chest:     Chest wall: No tenderness.  Musculoskeletal:        General: Normal range of motion.     Cervical back: Normal range of motion and neck supple.  Skin:    General: Skin is warm and dry.     Capillary Refill: Capillary  refill takes less than 2 seconds.     Coloration: Skin is not jaundiced or pale.     Findings: No bruising, erythema, lesion or rash.  Neurological:     General: No focal deficit present.     Mental Status: She is alert and oriented to person, place, and time. Mental status is at baseline.  Psychiatric:        Mood and Affect: Mood normal.        Behavior: Behavior normal.        Thought Content: Thought content normal.        Judgment: Judgment normal.    Results for orders placed or performed in visit on 06/24/21  CULTURE, URINE COMPREHENSIVE   Specimen: Urine, Bladder Wash   UR  Result Value Ref Range   Urine Culture, Comprehensive Final report    Organism ID, Bacteria Lactobacillus species   Microscopic Examination   Urine  Result Value Ref  Range   WBC, UA >30 (A) 0 - 5 /hpf   RBC 0-2 0 - 2 /hpf   Epithelial Cells (non renal) 0-10 0 - 10 /hpf   Renal Epithel, UA 0-10 (A) None seen /hpf   Bacteria, UA Many (A) None seen/Few  Urinalysis, Complete  Result Value Ref Range   Specific Gravity, UA 1.020 1.005 - 1.030   pH, UA 6.5 5.0 - 7.5   Color, UA Yellow Yellow   Appearance Ur Cloudy (A) Clear   Leukocytes,UA 3+ (A) Negative   Protein,UA Negative Negative/Trace   Glucose, UA Negative Negative   Ketones, UA Negative Negative   RBC, UA Negative Negative   Bilirubin, UA Negative Negative   Urobilinogen, Ur 0.2 0.2 - 1.0 mg/dL   Nitrite, UA Negative Negative   Microscopic Examination See below:   Cytology - Non PAP;  Result Value Ref Range   CYTOLOGY - NON GYN      CYTOLOGY - NON PAP CASE: [id number] PATIENT: Kristina Henson Non-Gynecological Cytology Report     Specimen Submitted: A. Urine  Clinical History: Gross hematuria      DIAGNOSIS: A. URINE; CYSTOSCOPY: - NEGATIVE FOR HIGH GRADE UROTHELIAL CARCINOMA. - BENIGN UROTHELIAL CELLS AND ACUTE INFLAMMATION.  The specimen is adequate for interpretation.  The [place] for Reporting Urinary Cytology 2016.   GROSS DESCRIPTION: A. Labeled: Labeled with the patient's name and date of birth (per requisition "urine, cystoscope wash") Received: Fresh Volume: Approximately 40 mL Description of fluid and container in which it is received: Received in a clear specimen container with a yellow screw top lid is dark yellow, transparent fluid. Specimen material submitted for: ThinPrep  [facility] 06/24/2021  Final Diagnosis performed by Betsy Pries, MD.   Electronically signed 06/25/2021 2:12:27PM The electronic signature indicates that the named Attending Pathologist has  evaluated the specimen Technical component performed at [facility], [address], [place], [place] [address] Lab: [phone] Dir: Rush Farmer, MD, MMM  Professional component performed at  [facility], [facility], [place], [place], [place] [address] Lab: [phone] Dir: Dellia Nims. Reuel Derby, MD       Assessment & Plan:   Problem List Items Addressed This Visit   None Visit Diagnoses     Dysuria    -  Primary   2+ leuks- will treat with nitrofurantoin while awaiting culture. Call with any concerns. Continue to follow with urology.    Relevant Orders   Urinalysis, Routine w reflex microscopic   Pyuria       Relevant Orders  Urine Culture        Follow up plan: Return As scheduled.

## 2021-07-10 LAB — URINE CULTURE

## 2021-07-16 ENCOUNTER — Institutional Professional Consult (permissible substitution): Payer: Medicare Other | Admitting: Neurology

## 2021-07-30 DIAGNOSIS — Z23 Encounter for immunization: Secondary | ICD-10-CM | POA: Diagnosis not present

## 2021-08-07 ENCOUNTER — Encounter: Payer: Self-pay | Admitting: Family Medicine

## 2021-08-07 ENCOUNTER — Other Ambulatory Visit: Payer: Self-pay

## 2021-08-07 ENCOUNTER — Ambulatory Visit (INDEPENDENT_AMBULATORY_CARE_PROVIDER_SITE_OTHER): Payer: Medicare Other | Admitting: Family Medicine

## 2021-08-07 VITALS — BP 123/81 | HR 108 | Temp 98.2°F | Wt 174.2 lb

## 2021-08-07 DIAGNOSIS — G4733 Obstructive sleep apnea (adult) (pediatric): Secondary | ICD-10-CM | POA: Insufficient documentation

## 2021-08-07 DIAGNOSIS — D500 Iron deficiency anemia secondary to blood loss (chronic): Secondary | ICD-10-CM | POA: Diagnosis not present

## 2021-08-07 DIAGNOSIS — I129 Hypertensive chronic kidney disease with stage 1 through stage 4 chronic kidney disease, or unspecified chronic kidney disease: Secondary | ICD-10-CM | POA: Diagnosis not present

## 2021-08-07 DIAGNOSIS — E782 Mixed hyperlipidemia: Secondary | ICD-10-CM

## 2021-08-07 MED ORDER — AMLODIPINE BESYLATE 2.5 MG PO TABS: 2.5000 mg | ORAL_TABLET | Freq: Every day | ORAL | 1 refills | Status: DC

## 2021-08-07 MED ORDER — LISINOPRIL-HYDROCHLOROTHIAZIDE 20-25 MG PO TABS: 1.0000 | ORAL_TABLET | Freq: Two times a day (BID) | ORAL | 1 refills | Status: DC

## 2021-08-07 MED ORDER — TRAZODONE HCL 50 MG PO TABS: 50.0000 mg | ORAL_TABLET | Freq: Every evening | ORAL | 2 refills | Status: DC | PRN

## 2021-08-07 MED ORDER — HYDRALAZINE HCL 50 MG PO TABS: 50.0000 mg | ORAL_TABLET | Freq: Two times a day (BID) | ORAL | 1 refills | Status: DC

## 2021-08-07 MED ORDER — BACLOFEN 10 MG PO TABS: 10.0000 mg | ORAL_TABLET | Freq: Every evening | ORAL | 3 refills | Status: DC | PRN

## 2021-08-07 NOTE — Assessment & Plan Note (Signed)
Newly diagnosed. Needs CPAP titration. Referral placed today. Continue to monitor. Call with any concerns. Continue to monitor.

## 2021-08-07 NOTE — Assessment & Plan Note (Signed)
Under good control on current regimen. Continue current regimen. Continue to monitor. Call with any concerns. Refills given. Labs drawn today.   

## 2021-08-07 NOTE — Progress Notes (Signed)
BP 123/81   Pulse (!) 108   Temp 98.2 F (36.8 C)   Wt 174 lb 3.2 oz (79 kg)   SpO2 97%   BMI 31.86 kg/m    Subjective:    Patient ID: Kristina Henson, female    DOB: 1955-11-22, 65 y.o.   MRN: [id number]  HPI: Kristina Henson is a 65 y.o. female  Chief Complaint  Patient presents with   Hyperlipidemia   Hypertension   SLEEP APNEA Sleep apnea status: uncontrolled Duration: chronic Satisfied with current treatment?:  no CPAP use:  doesn't have one Last sleep study: September Treatments attempted: none Wakes feeling refreshed:  yes Daytime hypersomnolence:  no Fatigue:  no Insomnia:  yes Good sleep hygiene:  yes Difficulty falling asleep:  yes Difficulty staying asleep:  no Snoring bothers bed partner:  no Observed apnea by bed partner: no Obesity:  yes Hypertension: yes  Pulmonary hypertension:  no Coronary artery disease:  no  HYPERTENSION / HYPERLIPIDEMIA Satisfied with current treatment? yes Duration of hypertension: chronic BP monitoring frequency: not checking BP medication side effects: no Past BP meds: amlodipine, hydralzine, lisinopril, HCTZ Duration of hyperlipidemia: chronic Cholesterol medication side effects: not on anything Cholesterol supplements: none Past cholesterol medications: none Medication compliance: excellent compliance Aspirin: yes Recent stressors: no Recurrent headaches: no Visual changes: no Palpitations: no Dyspnea: no Chest pain: no Lower extremity edema: no Dizzy/lightheaded: no   Relevant past medical, surgical, family and social history reviewed and updated as indicated. Interim medical history since our last visit reviewed. Allergies and medications reviewed and updated.  Review of Systems  Constitutional: Negative.   Respiratory: Negative.    Cardiovascular: Negative.   Gastrointestinal: Negative.   Musculoskeletal: Negative.   Neurological: Negative.   Psychiatric/Behavioral: Negative.     Per HPI unless  specifically indicated above     Objective:    BP 123/81   Pulse (!) 108   Temp 98.2 F (36.8 C)   Wt 174 lb 3.2 oz (79 kg)   SpO2 97%   BMI 31.86 kg/m   Wt Readings from Last 3 Encounters:  08/07/21 174 lb 3.2 oz (79 kg)  07/07/21 166 lb (75.3 kg)  06/24/21 166 lb 9.6 oz (75.6 kg)    Physical Exam Vitals and nursing note reviewed.  Constitutional:      General: She is not in acute distress.    Appearance: Normal appearance. She is not ill-appearing, toxic-appearing or diaphoretic.  HENT:     Head: Normocephalic and atraumatic.     Right Ear: External ear normal.     Left Ear: External ear normal.     Nose: Nose normal.     Mouth/Throat:     Mouth: Mucous membranes are moist.     Pharynx: Oropharynx is clear.  Eyes:     General: No scleral icterus.       Right eye: No discharge.        Left eye: No discharge.     Extraocular Movements: Extraocular movements intact.     Conjunctiva/sclera: Conjunctivae normal.     Pupils: Pupils are equal, round, and reactive to light.  Cardiovascular:     Rate and Rhythm: Normal rate and regular rhythm.     Pulses: Normal pulses.     Heart sounds: Normal heart sounds. No murmur heard.   No friction rub. No gallop.  Pulmonary:     Effort: Pulmonary effort is normal. No respiratory distress.     Breath sounds: Normal breath  sounds. No stridor. No wheezing, rhonchi or rales.  Chest:     Chest wall: No tenderness.  Musculoskeletal:        General: Normal range of motion.     Cervical back: Normal range of motion and neck supple.  Skin:    General: Skin is warm and dry.     Capillary Refill: Capillary refill takes less than 2 seconds.     Coloration: Skin is not jaundiced or pale.     Findings: No bruising, erythema, lesion or rash.  Neurological:     General: No focal deficit present.     Mental Status: She is alert and oriented to person, place, and time. Mental status is at baseline.  Psychiatric:        Mood and Affect: Mood  normal.        Behavior: Behavior normal.        Thought Content: Thought content normal.        Judgment: Judgment normal.    Results for orders placed or performed in visit on 07/07/21  Urine Culture   Specimen: Urine   UR  Result Value Ref Range   Urine Culture, Routine Final report    Organism ID, Bacteria Lactobacillus species    ORGANISM ID, BACTERIA Comment   Microscopic Examination   Urine  Result Value Ref Range   WBC, UA 0-5 0 - 5 /hpf   RBC None seen 0 - 2 /hpf   Epithelial Cells (non renal) 0-10 0 - 10 /hpf   Bacteria, UA Moderate (A) None seen/Few  Urinalysis, Routine w reflex microscopic  Result Value Ref Range   Specific Gravity, UA 1.010 1.005 - 1.030   pH, UA 5.0 5.0 - 7.5   Color, UA Yellow Yellow   Appearance Ur Cloudy (A) Clear   Leukocytes,UA 1+ (A) Negative   Protein,UA Negative Negative/Trace   Glucose, UA Negative Negative   Ketones, UA Negative Negative   RBC, UA Negative Negative   Bilirubin, UA Negative Negative   Urobilinogen, Ur 0.2 0.2 - 1.0 mg/dL   Nitrite, UA Negative Negative   Microscopic Examination See below:       Assessment & Plan:   Problem List Items Addressed This Visit       Respiratory   OSA (obstructive sleep apnea)    Newly diagnosed. Needs CPAP titration. Referral placed today. Continue to monitor. Call with any concerns. Continue to monitor.       Relevant Orders   Ambulatory referral to Sleep Studies     Genitourinary   Benign hypertensive renal disease - Primary    Under good control on current regimen. Continue current regimen. Continue to monitor. Call with any concerns. Refills given. Labs drawn today.       Relevant Orders   Comprehensive metabolic panel   Urine Microalbumin w/creat. ratio     Other   HLD (hyperlipidemia)    Under good control on current regimen. Continue current regimen. Continue to monitor. Call with any concerns. Refills given. Labs drawn today.       Relevant Medications    hydrALAZINE (APRESOLINE) 50 MG tablet   lisinopril-hydrochlorothiazide (ZESTORETIC) 20-25 MG tablet   amLODipine (NORVASC) 2.5 MG tablet   Other Relevant Orders   Comprehensive metabolic panel   Lipid Panel w/o Chol/HDL Ratio   Iron deficiency anemia due to chronic blood loss    Rechecking labs today. Await results. Treat as needed.       Relevant Orders   CBC  with Differential/Platelet   Iron and TIBC   Ferritin     Follow up plan: Return in about 6 months (around 02/04/2022).

## 2021-08-07 NOTE — Assessment & Plan Note (Signed)
Rechecking labs today. Await results. Treat as needed.  °

## 2021-08-08 LAB — CBC WITH DIFFERENTIAL/PLATELET
Basophils Absolute: 0 10*3/uL (ref 0.0–0.2)
Basos: 1 %
EOS (ABSOLUTE): 0 10*3/uL (ref 0.0–0.4)
Eos: 1 %
Hematocrit: 35 % (ref 34.0–46.6)
Hemoglobin: 12 g/dL (ref 11.1–15.9)
Immature Grans (Abs): 0 10*3/uL (ref 0.0–0.1)
Immature Granulocytes: 1 %
Lymphocytes Absolute: 0.5 10*3/uL — ABNORMAL LOW (ref 0.7–3.1)
Lymphs: 17 %
MCH: 28.8 pg (ref 26.6–33.0)
MCHC: 34.3 g/dL (ref 31.5–35.7)
MCV: 84 fL (ref 79–97)
Monocytes Absolute: 0.1 10*3/uL (ref 0.1–0.9)
Monocytes: 5 %
Neutrophils Absolute: 2.3 10*3/uL (ref 1.4–7.0)
Neutrophils: 75 %
Platelets: 127 10*3/uL — ABNORMAL LOW (ref 150–450)
RBC: 4.17 x10E6/uL (ref 3.77–5.28)
RDW: 16.3 % — ABNORMAL HIGH (ref 11.7–15.4)
WBC: 3.1 10*3/uL — ABNORMAL LOW (ref 3.4–10.8)

## 2021-08-08 LAB — COMPREHENSIVE METABOLIC PANEL
ALT: 30 IU/L (ref 0–32)
AST: 27 IU/L (ref 0–40)
Albumin/Globulin Ratio: 1.4 (ref 1.2–2.2)
Albumin: 4.2 g/dL (ref 3.8–4.8)
Alkaline Phosphatase: 94 IU/L (ref 44–121)
BUN/Creatinine Ratio: 32 — ABNORMAL HIGH (ref 12–28)
BUN: 23 mg/dL (ref 8–27)
Bilirubin Total: 0.7 mg/dL (ref 0.0–1.2)
CO2: 25 mmol/L (ref 20–29)
Calcium: 9.6 mg/dL (ref 8.7–10.3)
Chloride: 100 mmol/L (ref 96–106)
Creatinine, Ser: 0.73 mg/dL (ref 0.57–1.00)
Globulin, Total: 3 g/dL (ref 1.5–4.5)
Glucose: 97 mg/dL (ref 70–99)
Potassium: 4 mmol/L (ref 3.5–5.2)
Sodium: 138 mmol/L (ref 134–144)
Total Protein: 7.2 g/dL (ref 6.0–8.5)
eGFR: 92 mL/min/{1.73_m2} (ref >59)

## 2021-08-08 LAB — IRON AND TIBC
Iron Saturation: 12 % — ABNORMAL LOW (ref 15–55)
Iron: 36 ug/dL (ref 27–139)
Total Iron Binding Capacity: 303 ug/dL (ref 250–450)
UIBC: 267 ug/dL (ref 118–369)

## 2021-08-08 LAB — LIPID PANEL W/O CHOL/HDL RATIO
Cholesterol, Total: 179 mg/dL (ref 100–199)
HDL: 50 mg/dL (ref >39)
LDL Chol Calc (NIH): 111 mg/dL — ABNORMAL HIGH (ref 0–99)
Triglycerides: 98 mg/dL (ref 0–149)
VLDL Cholesterol Cal: 18 mg/dL (ref 5–40)

## 2021-08-08 LAB — FERRITIN: Ferritin: 182 ng/mL — ABNORMAL HIGH (ref 15–150)

## 2021-08-15 ENCOUNTER — Encounter: Payer: Self-pay | Admitting: Family Medicine

## 2021-08-18 DIAGNOSIS — I129 Hypertensive chronic kidney disease with stage 1 through stage 4 chronic kidney disease, or unspecified chronic kidney disease: Secondary | ICD-10-CM | POA: Diagnosis not present

## 2021-08-18 DIAGNOSIS — Z23 Encounter for immunization: Secondary | ICD-10-CM | POA: Diagnosis not present

## 2021-08-18 LAB — MICROALBUMIN, URINE WAIVED
Creatinine, Urine Waived: 50 mg/dL (ref 10–300)
Microalb, Ur Waived: 10 mg/L (ref 0–19)
Microalb/Creat Ratio: <30 mg/g (ref <30)

## 2021-08-24 NOTE — Progress Notes (Signed)
   08/25/21  CC:  Chief Complaint  Patient presents with   Cysto     HPI: Kristina Henson is a 65 y.o.female with a personal history of gross hematuria, recurrent UTIs, and vaginal atrophy, who presents today for a cystoscopy.   She has two documented UTIs this year one on 11/24/2020 which grew Klebsiella pneumoniae resistant to ampicillin and another on 01/16/2021 which grew Klebsiella pneumoniae resistant to ampicillin   She underwent a CT urogram on 06/22/2021 that was negative.   Previous cystoscopy on 06/24/2021 revealed mild edema at bladder neck and inflammation along trigone which was a little more pronounced than normal.  Urine today had 11-30 wbcs, 6-10 RBCs, and many bacteria.   Vitals:   08/25/21 1112  BP: (!) 163/100  Pulse: (!) 106   NED. A&Ox3.   No respiratory distress   Abd soft, NT, ND Normal external genitalia with patent urethral meatus  Cystoscopy Procedure Note  Patient identification was confirmed, informed consent was obtained, and patient was prepped using Betadine solution.  Lidocaine jelly was administered per urethral meatus.    Procedure: - Flexible cystoscope introduced, without any difficulty.   - Thorough search of the bladder revealed:    normal urethral meatus    normal urothelium     Moderate edema at bladder neck and inflammation along trigone which was a little more pronounced than normal    no stones    no ulcers     no tumors    no urethral polyps    no trabeculation  - Ureteral orifices were normal in position and appearance.  Post-Procedure: - Patient tolerated the procedure well  Assessment/ Plan:   Trigonitis/ bladder inflammation  - Unchanged since previous cystoscopy  - Due to degree of inflammation and irregularity that is fairly focal, recommend  bladder biopsy to rule out any pathology. We discussed risk and benefits -She is nervous but agreeable this plan - We sent for  urine for culture     I,Kailey  Littlejohn,acting as a scribe for Hollice Espy, MD.,have documented all relevant documentation on the behalf of Hollice Espy, MD,as directed by  Hollice Espy, MD while in the presence of Hollice Espy, MD.  I have reviewed the above documentation for accuracy and completeness, and I agree with the above.   Hollice Espy, MD

## 2021-08-25 ENCOUNTER — Other Ambulatory Visit: Payer: Self-pay

## 2021-08-25 ENCOUNTER — Telehealth: Payer: Self-pay | Admitting: Urology

## 2021-08-25 ENCOUNTER — Other Ambulatory Visit: Payer: Self-pay | Admitting: Urology

## 2021-08-25 ENCOUNTER — Ambulatory Visit (INDEPENDENT_AMBULATORY_CARE_PROVIDER_SITE_OTHER): Payer: Medicare Other | Admitting: Urology

## 2021-08-25 ENCOUNTER — Encounter: Payer: Self-pay | Admitting: Urology

## 2021-08-25 VITALS — BP 163/100 | HR 106 | Ht 62.0 in | Wt 174.0 lb

## 2021-08-25 DIAGNOSIS — R310 Gross hematuria: Secondary | ICD-10-CM

## 2021-08-25 DIAGNOSIS — N329 Bladder disorder, unspecified: Secondary | ICD-10-CM

## 2021-08-25 LAB — MICROSCOPIC EXAMINATION

## 2021-08-25 LAB — URINALYSIS, COMPLETE
Bilirubin, UA: NEGATIVE
Glucose, UA: NEGATIVE
Ketones, UA: NEGATIVE
Nitrite, UA: NEGATIVE
Protein,UA: NEGATIVE
Specific Gravity, UA: 1.02 (ref 1.005–1.030)
Urobilinogen, Ur: 0.2 mg/dL (ref 0.2–1.0)
pH, UA: 5.5 (ref 5.0–7.5)

## 2021-08-25 NOTE — Telephone Encounter (Signed)
Called pt. To discuss surgery and she said she discussed a surgery date after Christmas time with Dr. Erlene Quan and for me to call her back after Christmas.

## 2021-08-25 NOTE — Progress Notes (Signed)
Surgical Physician Order Form [facility] [place]  * Scheduling expectation : Next Available  *Length of Case:   *Clearance needed: no  *Anticoagulation Instructions: Hold all anticoagulants  *Aspirin Instructions: Ok to continue Aspirin  *Post-op visit Date/Instructions:   TBD  *Diagnosis: Bladder Lesion  *Procedure:     Cysto Bladder Biopsy (13887)   Additional orders: N/A  -Admit type: OUTpatient  -Anesthesia: MAC  -VTE Prophylaxis Standing Order SCD's       Other:   -Standing Lab Orders Per Anesthesia    Lab other: None  -Standing Test orders EKG/Chest x-ray per Anesthesia       Test other:   - Medications:  Ancef 2gm IV  ok with allergy  -Other orders:  N/A

## 2021-08-25 NOTE — Patient Instructions (Signed)
Will call to setup procedure

## 2021-08-28 ENCOUNTER — Ambulatory Visit: Payer: Self-pay

## 2021-08-28 ENCOUNTER — Telehealth: Payer: Self-pay | Admitting: Family Medicine

## 2021-08-28 NOTE — Telephone Encounter (Signed)
Called patient, no answer LVM to make patient aware that is fine she just needs to call the sleep study place and reschedule.

## 2021-08-28 NOTE — Telephone Encounter (Signed)
Copied from [place] [id number]. Topic: General - Other >> Aug 28, 2021  8:28 AM Kristina Henson wrote: Reason for CRM: Pt states the sleep study location contacted her to come in and do the sleep study this month on 12.7.22/ but pt has a few issues this month with transportation and having a busy work schedule so pt would like to know if it will be ok if she does this January /pt is suppose to go for a covid test on Monday for the sleep study / so she would like to know something today/  please advise asap

## 2021-08-28 NOTE — Telephone Encounter (Signed)
Patient called and says she had a urology procedure on Tuesday and wondered if the bleeding on yesterday could be from that. I advised it could be, but she will need to contact the urology office, number provided, to let them know what is going on and if they say contact PCP, call us back at the office. Patient verbalized understanding.  Summary: vaginal bleeding   Pt stated she had a procedure done on Tuesday and had some bleeding yesterday and was concerned if this was from the procedure or a possible UTI/ please advise        Reason for Disposition  Health Information question, no triage required and triager able to answer question  Answer Assessment - Initial Assessment Questions 1. REASON FOR CALL or QUESTION: "What is your reason for calling today?" or "How can I best help you?" or "What question do you have that I can help answer?"     Bleeding after procedure  Protocols used: Information Only Call - No Triage-A-AH

## 2021-08-30 LAB — CULTURE, URINE COMPREHENSIVE

## 2021-08-31 ENCOUNTER — Telehealth: Payer: Self-pay | Admitting: *Deleted

## 2021-08-31 MED ORDER — CEPHALEXIN 500 MG PO CAPS: 500.0000 mg | ORAL_CAPSULE | Freq: Three times a day (TID) | ORAL | 0 refills | Status: DC

## 2021-08-31 NOTE — Telephone Encounter (Signed)
Left VM with details sent in RX to [facility]

## 2021-08-31 NOTE — Telephone Encounter (Signed)
-----   Message from Hollice Espy, MD sent at 08/31/2021 12:31 PM EST ----- Since work on a plan on biopsying her bladder, lets go ahead and treat this as a precaution.  Please treat with Keflex 500 mg 3 times a day for 5 days.  Okay with allergy.  Hollice Espy, MD

## 2021-09-01 NOTE — Telephone Encounter (Signed)
Called patient to verify picked up RX-patient did not pick it up due to transportation. She will pick up today to start. She has been having mild dysuria.

## 2021-09-29 NOTE — Progress Notes (Signed)
Called pt. To discuss surgery and she said she discussed a surgery date after Christmas time with Dr. Erlene Quan and for me to call her back after Christmas.

## 2021-10-13 ENCOUNTER — Telehealth: Payer: Self-pay

## 2021-10-13 ENCOUNTER — Other Ambulatory Visit: Payer: Self-pay | Admitting: Family Medicine

## 2021-10-13 DIAGNOSIS — Z1231 Encounter for screening mammogram for malignant neoplasm of breast: Secondary | ICD-10-CM

## 2021-10-13 NOTE — Telephone Encounter (Signed)
I spoke with Kristina Henson. We have discussed possible surgery dates and Monday February 14th, 2023 was agreed upon by all parties. Patient given information about surgery date, what to expect pre-operatively and post operatively.   We discussed that a Pre-Admission Testing office will be calling to set up the pre-op visit that will take place prior to surgery, and that these appointments are typically done over the phone with a Pre-Admissions RN. Informed patient that our office will communicate any additional care to be provided after surgery.   Patients questions or concerns were discussed during our call. Advised to call our office should there be any additional information, questions or concerns that arise. Patient verbalized understanding.

## 2021-10-13 NOTE — Progress Notes (Signed)
[facility] Posting Form   Surgery Date/Time: Date: 11/02/2021  Surgeon: Dr. Hollice Espy, MD  Surgery Location: Day Surgery  Inpt ( No  )   Outpt (Yes)   Obs ( No  )   Diagnosis: Bladder Lesion N32.9  -CPT: 41443  Surgery: Cystoscopy with Bladder Biopsy  Stop Anticoagulations: Yes, may continue ASA  Cardiac/Medical/Pulmonary Clearance needed: no  *Orders entered into EPIC  Date: 10/13/21   *Case booked in EPIC  Date: 10/13/21  *Notified pt of Surgery: Date: 10/09/2021  *Placed into Prior Authorization Work Fabio Bering Date: 10/13/21   Assistant/laser/rep:No

## 2021-10-26 ENCOUNTER — Other Ambulatory Visit
Admission: RE | Admit: 2021-10-26 | Discharge: 2021-10-26 | Disposition: A | Discharge status: Home / Self Care | Payer: Medicare Other | Source: Ambulatory Visit | Attending: Urology | Admitting: Urology

## 2021-10-26 ENCOUNTER — Other Ambulatory Visit: Payer: Self-pay

## 2021-10-26 HISTORY — DX: Sleep apnea, unspecified: G47.30

## 2021-10-26 NOTE — Patient Instructions (Addendum)
Your procedure is scheduled on: Monday November 02, 2021. Report to Day Surgery inside [facility] 2nd floor, stop by admissions desk before getting on elevator. To find out your arrival time please call [phone] between 1PM - 3PM on Friday October 30, 2021.  Remember: Instructions that are not followed completely may result in serious medical risk,  up to and including death, or upon the discretion of your surgeon and anesthesiologist your  surgery may need to be rescheduled.     _X__ 1. Do not eat food or drink fluids after midnight the night before your procedure.                 No chewing gum or hard candies.   __X__2.  On the morning of surgery brush your teeth with toothpaste and water, you                may rinse your mouth with mouthwash if you wish.  Do not swallow any toothpaste or mouthwash.     _X__ 3.  No Alcohol for 24 hours before or after surgery.   _X__ 4.  Do Not Smoke or use e-cigarettes For 24 Hours Prior to Your Surgery.                 Do not use any chewable tobacco products for at least 6 hours prior to                 Surgery.  _X__  5.  Do not use any recreational drugs (marijuana, cocaine, heroin, ecstasy, MDMA or other)                For at least one week prior to your surgery.  Combination of these drugs with anesthesia                May have life threatening results.  ____  6.  Bring all medications with you on the day of surgery if instructed.   __X__  7.  Notify your doctor if there is any change in your medical condition      (cold, fever, infections).     Do not wear jewelry, make-up, hairpins, clips or nail polish. Do not wear lotions, powders, or perfumes. You may wear deodorant. Do not shave 48 hours prior to surgery.  Do not bring valuables to the hospital.    [facility] is not responsible for any belongings or valuables.  Contacts, dentures or bridgework may not be worn into surgery. Leave your suitcase in  the car. After surgery it may be brought to your room. For patients admitted to the hospital, discharge time is determined by your treatment team.   Patients discharged the day of surgery will not be allowed to drive home.   Make arrangements for someone to be with you for the first 24 hours of your Same Day Discharge.   __X__ Take these medicines the morning of surgery with A SIP OF WATER:    1. None   2.   3.   4.  5.  6.  ____ Fleet Enema (as directed)   ____ Use CHG Soap (or wipes) as directed  ____ Use Benzoyl Peroxide Gel as instructed  ____ Use inhalers on the day of surgery  ____ Stop metformin 2 days prior to surgery    ____ Take 1/2 of usual insulin dose the night before surgery. No insulin the morning          of surgery.  ____ Call your PCP, cardiologist, or Pulmonologist if taking Coumadin/Plavix/aspirin and ask when to stop before your surgery.   __X__ One Week prior to surgery- Stop Anti-inflammatories such as Ibuprofen, Aleve, Advil, Motrin, meloxicam (MOBIC), diclofenac, etodolac, ketorolac, Toradol, Daypro, piroxicam, Goody's or BC powders. OK TO USE TYLENOL IF NEEDED   __X__ One week prior to surgery- Stop supplements until after surgery. CRANBERRY and Ascorbic Acid (VITAMIN C)  ____ Bring C-Pap to the hospital.    If you have any questions regarding your pre-procedure instructions,  Please call Pre-admit Testing at [phone]

## 2021-10-27 ENCOUNTER — Ambulatory Visit: Payer: Medicare Other | Admitting: Urgent Care

## 2021-10-27 ENCOUNTER — Encounter: Payer: Self-pay | Admitting: Urology

## 2021-10-29 ENCOUNTER — Encounter: Payer: Self-pay | Admitting: Urology

## 2021-10-29 NOTE — Progress Notes (Signed)
Perioperative Services  Pre-Admission/Anesthesia Testing Clinical Review  Date: 10/29/21  Patient Demographics:  Name: Kristina Henson DOB:   11-30-1955 MRN:   [id number]  Planned Surgical Procedure(s):    Case: [id number] Date/Time: 11/02/21 1128   Procedure: CYSTOSCOPY WITH BLADDER BIOPSY   Anesthesia type: Monitor Anesthesia Care   Pre-op diagnosis: Bladder Lesion   Location: [facility]   Surgeons: Hollice Espy, MD   NOTE: Available PAT nursing documentation and vital signs have been reviewed. Clinical nursing staff has updated patient's PMH/PSHx, current medication list, and drug allergies/intolerances to ensure comprehensive history available to assist in medical decision making as it pertains to the aforementioned surgical procedure and anticipated anesthetic course. Extensive review of available clinical information performed. [place] PMH and PSHx updated with any diagnoses/procedures that  may have been inadvertently omitted during her intake with the pre-admission testing department's nursing staff.  Clinical Discussion:  Kristina Henson is a 66 y.o. female who is submitted for pre-surgical anesthesia review and clearance prior to her undergoing the above procedure. Patient is a Former Smoker (quit 09/1976). Pertinent PMH includes: CAD, LBBB, atrial fibrillation, CVA, PAH, ascending aorta dilation, aortic atherosclerosis, HTN, HLD, OSAH (no nocturnal PAP therapy), IDA, OA.  Patient is followed by cardiology Ubaldo Glassing, MD). She was last seen in the cardiology clinic on 05/04/2021; notes reviewed.  At the time of her clinic visit, patient doing well overall from a cardiovascular perspective.  She denied any chest pain, shortness breath, PND, orthopnea, palpitations, vertiginous symptoms, or presyncope/syncope.  Patient with mild intermittent lower extremity edema related to dietary sodium intake; relieved with extremity elevation.  Past medical  history significant for cardiovascular diagnoses.  Patient underwent atrial fibrillation/SVT ablation in 2008.  Patient suffered a CVA back in 2012 while living in [place]; details unavailable at the time of consult.  TTE performed on 08/12/2017 revealed a normal left ventricular systolic function with mild LVH; LVEF 55%.  There was trivial aortic and mitral valve regurgitation.  There was no evidence of a transvalvular gradient to suggest stenosis.  Myocardial perfusion imaging study performed on 08/15/2017 revealed a normal left ventricular systolic function with an EF of 74%.  There were no obvious ischemic changes or arrhythmias noted with stress.  With that being said, there was small area of inferoseptal hypoperfusion with no significant reversibility noted.  Exercise tolerance noted to be good.  Patient with a history of atrial fibrillation; CHA2DS2-VASc Score = 5 (sex, HTN, CVA x 2, aortic plaque).  Rate and rhythm currently maintained on oral amiodarone.  She is not currently taking any type of anticoagulation as rivaroxaban was cost prohibitive and she experienced difficulties associated with long-term warfarin therapy.  Blood pressure well controlled at 128/68 on currently prescribed CCB, ACEi, and diuretic therapies.  She is not on any type of lipid-lowering therapy for her HLD or ASCVD prevention; managed with diet lifestyle modifications. She is not diabetic. Functional capacity, as defined by DASI, is documented as being >/= 4 METS.  No changes were made to her medication regimen.  Patient to follow-up with outpatient cardiology in 1 year or sooner if needed.  Kristina Henson is scheduled for an CYSTOSCOPY WITH BLADDER BIOPSY on 11/02/2021 with Dr. Hollice Espy, MD. Given patient's past medical history significant for cardiovascular diagnoses, presurgical cardiac clearance was sought by the PAT team. Per cardiology, "this patient is optimized for surgery and may proceed with the  planned procedural course with a  LOW risk of significant perioperative cardiovascular complications". This patient is on daily antiplatelet therapy. She has been instructed on recommendations for holding her daily low dose ASA for 5 days prior to her procedure with plans to restart as soon as postoperative bleeding risk felt to be minimized by her attending surgeon. The patient has been instructed that her last dose of her anticoagulant will be on 10/27/2021.  Patient reports previous perioperative complications with anesthesia in the past. In review of the available records, it is noted that patient underwent a general anesthetic course here (ASA III) in 11/2020 without documented complications.   Vitals with BMI 10/26/2021 08/25/2021 08/07/2021  Height 5' 2"  5' 2"  -  Weight 165 lbs 174 lbs 174 lbs 3 oz  BMI 66.44 03.47 -  Systolic - 425 956  Diastolic - 387 81  Pulse - 106 108    Providers/Specialists:   NOTE: Primary physician provider listed below. Patient may have been seen by APP or partner within same practice.   PROVIDER ROLE / SPECIALTY LAST Lu Duffel, MD Urology (Surgeon) 08/25/2021  Valerie Roys, DO Primary Care Provider 08/07/2021  Bartholome Bill, MD Cardiology 05/04/2021   Allergies:  Amoxicillin  Current Home Medications:   No current facility-administered medications for this encounter.    amiodarone (PACERONE) 200 MG tablet   amLODipine (NORVASC) 2.5 MG tablet   Ascorbic Acid (VITAMIN C) 100 MG tablet   aspirin EC 81 MG tablet   baclofen (LIORESAL) 10 MG tablet   CRANBERRY PO   ferrous sulfate 325 (65 FE) MG EC tablet   hydrALAZINE (APRESOLINE) 50 MG tablet   lisinopril-hydrochlorothiazide (ZESTORETIC) 20-25 MG tablet   traZODone (DESYREL) 50 MG tablet   cephALEXin (KEFLEX) 500 MG capsule   History:   Past Medical History:  Diagnosis Date   Aortic atherosclerosis (HCC)    Arthritis    BACK-LUMBAR   Ascending aorta dilation ([place]) 10/16/2020    a.) TTE 10/16/2020 --> borderline at 37 mm.   Atrial fibrillation ([place])    a.) CHA2DS2-VASc = 5 (sex, HTN, CVA x 2, aortic plaque). b.) s/p ablation in 2008. c.) rate/rhythm maintained on oral amiodarone; not currently on daily anticoagulation.   Coronary artery disease    Duodenitis    Gastritis    HLD (hyperlipidemia)    Hypertension    IDA (iron deficiency anemia)    LBBB (left bundle branch block)    Mass of left submandibular region 04/29/2016   a.) FNA 04/29/2016 --> Bx (+) for pleomorphic adenoma; s/p resection   PAH (pulmonary artery hypertension) ([place]) 07/24/2020   a.) CT 07/24/2020 showed enlarged pulmonary artery consistent with PAH.   Pneumonia 2016   Sleep apnea    Stroke [facility]) 2012   Past Surgical History:  Procedure Laterality Date   BIOPSY SALIVARY GLAND Left 04/29/2016   Procedure: SUBMANDIBULAR GLAND BIOPSY (FNA); Location: [place]; Surgeon: Markus Daft, MD (IR)   cardiac catherization     CARDIAC ELECTROPHYSIOLOGY STUDY AND ABLATION N/A 2008   Procedure: ATRIAL FIBRILLATION ABLATION; Location: [facility]   COLONOSCOPY WITH PROPOFOL N/A 11/18/2015   Procedure: COLONOSCOPY WITH PROPOFOL;  Surgeon: Lucilla Lame, MD;  Location: [facility] ENDOSCOPY;  Service: Endoscopy;  Laterality: N/A;   COLONOSCOPY WITH PROPOFOL N/A 12/02/2020   Procedure: COLONOSCOPY WITH PROPOFOL;  Surgeon: Lucilla Lame, MD;  Location: [facility] END[facility]COPY;  Service: Endoscopy;  Laterality: N/A;   CORONARY ANGIOPLASTY     ESOPHAGOGASTRODUODENOSCOPY (EGD) WITH PROPOFOL N/A 04/23/2016   Procedure:  ESOPHAGOGASTRODUODENOSCOPY (EGD) WITH PROPOFOL;  Surgeon: Lucilla Lame, MD;  Location: [place];  Service: Endoscopy;  Laterality: N/A;  small bowel bx gastric antrum bx   SUBMANDIBULAR GLAND EXCISION Left 09/08/2017   Procedure: EXCISION SUBMANDIBULAR GLAND;  Surgeon: Carloyn Manner, MD;  Location: [facility] ORS;  Service: ENT;  Laterality: Left;   TOTAL ABDOMINAL HYSTERECTOMY W/  BILATERAL SALPINGOOPHORECTOMY     Total   Family History  Problem Relation Age of Onset   Arthritis Mother    Cancer Mother    Hypertension Sister    Asthma Brother    Dementia Brother    Breast cancer Neg Hx    Kidney cancer Neg Hx    Bladder Cancer Neg Hx    Social History   Tobacco Use   Smoking status: Former    Types: Cigarettes    Quit date: 1978    Years since quitting: 45.1   Smokeless tobacco: Never   Tobacco comments:    didnt smoke much   Vaping Use   Vaping Use: Never used  Substance Use Topics   Alcohol use: Yes    Alcohol/week: 1.0 standard drink    Types: 1 Glasses of wine per week    Comment: occasionally   Drug use: No    Pertinent Clinical Results:  LABS: Labs reviewed: Acceptable for surgery.  Lab Results  Component Value Date   WBC 3.1 (L) 08/07/2021   HGB 12.0 08/07/2021   HCT 35.0 08/07/2021   MCV 84 08/07/2021   PLT 127 (L) 08/07/2021   Lab Results  Component Value Date   NA 138 08/07/2021   K 4.0 08/07/2021   CO2 25 08/07/2021   GLUCOSE 97 08/07/2021   BUN 23 08/07/2021   CREATININE 0.73 08/07/2021   CALCIUM 9.6 08/07/2021   EGFR 92 08/07/2021   GFRNONAA >60 05/08/2021    Procedure visit on 08/25/2021  Component Date Value Ref Range Status   Specific Gravity, UA 08/25/2021 1.020  1.005 - 1.030 Final   pH, UA 08/25/2021 5.5  5.0 - 7.5 Final   Color, UA 08/25/2021 Yellow  Yellow Final   Appearance Ur 08/25/2021 Cloudy (A)  Clear Final   Leukocytes,UA 08/25/2021 2+ (A)  Negative Final   Protein,UA 08/25/2021 Negative  Negative/Trace Final   Glucose, UA 08/25/2021 Negative  Negative Final   Ketones, UA 08/25/2021 Negative  Negative Final   RBC, UA 08/25/2021 1+ (A)  Negative Final   Bilirubin, UA 08/25/2021 Negative  Negative Final   Urobilinogen, Ur 08/25/2021 0.2  0.2 - 1.0 mg/dL Final   Nitrite, UA 08/25/2021 Negative  Negative Final   Microscopic Examination 08/25/2021 See below:   Final   Urine Culture, Comprehensive  08/25/2021 Final report   Final   Organism ID, Bacteria 08/25/2021 Lactobacillus species   Final   Comment: Greater than 100,000 colony forming units per mL Susceptibility not normally performed on this organism.    WBC, UA 08/25/2021 11-30 (A)  0 - 5 /hpf Final   RBC 08/25/2021 3-10 (A)  0 - 2 /hpf Final   Epithelial Cells (non renal) 08/25/2021 0-10  0 - 10 /hpf Final   Renal Epithel, UA 08/25/2021 0-10 (A)  None seen /hpf Final   Bacteria, UA 08/25/2021 Many (A)  None seen/Few Final    ECG: Date: 05/04/2021 Rate: 59 bpm Rhythm: NSR; LBBB Axis (leads I and aVF): Normal Intervals: PR 228 ms. QRS 1560 ms. QTc 477 ms. ST segment and T wave changes: Nonspecific ST and  T wave changes Comparison: Similar to previous tracing obtained on 04/28/2020 NOTE: Tracing obtained at [facility]; unable for review. Above based on cardiologist's interpretation.    IMAGING / PROCEDURES: CT HEMATURIA WORKUP performed on 06/22/2021 No acute findings in the abdomen or pelvis. Specifically, no findings to explain the patient's history of hematuria. Cholelithiasis. Tiny hiatal hernia. Aortic atherosclerosis   MYOCARDIAL PERFUSION IMAGING STUDY (LEXISCAN) performed on 08/15/2017 LVEF 74% Normal myocardial thickening and wall motion No artifacts noted Left ventricular cavity size normal No obvious ischemic changes or arrhythmias noted with stress Sestamibi images revealed a mild area of small inferoseptal hypoperfusion with stress and rest with no significant reversibility  TRANSTHORACIC ECHOCARDIOGRAM performed on 08/12/2017 LVEF greater than 55% Normal left ventricular systolic function with mild LVH Normal right ventricular systolic function Trivial AR and MR No TR or PR No valvular stenosis No pericardial effusion  Impression and Plan:  Kristina Henson has been referred for pre-anesthesia review and clearance prior to her undergoing the planned anesthetic and procedural courses.  Available labs, pertinent testing, and imaging results were personally reviewed by me. This patient has been appropriately cleared by cardiology with an overall LOW risk of significant perioperative cardiovascular complications.  Based on clinical review performed today (10/29/21), barring any significant acute changes in the patient's overall condition, it is anticipated that she will be able to proceed with the planned surgical intervention. Any acute changes in clinical condition may necessitate her procedure being postponed and/or cancelled. Patient will meet with anesthesia team (MD and/or CRNA) on the day of her procedure for preoperative evaluation/assessment. Questions regarding anesthetic course will be fielded at that time.   Pre-surgical instructions were reviewed with the patient during her PAT appointment and questions were fielded by PAT clinical staff. Patient was advised that if any questions or concerns arise prior to her procedure then she should return a call to PAT and/or her surgeon's office to discuss.  Honor Loh, MSN, APRN, FNP-C, CEN [facility]  Peri-operative Services Nurse Practitioner Phone: [phone] Fax: [phone] 10/29/21 9:27 AM  NOTE: This note has been prepared using Dragon dictation software. Despite my best ability to proofread, there is always the potential that unintentional transcriptional errors may still occur from this process.

## 2021-10-30 ENCOUNTER — Other Ambulatory Visit: Payer: Self-pay

## 2021-10-30 ENCOUNTER — Other Ambulatory Visit
Admission: RE | Admit: 2021-10-30 | Discharge: 2021-10-30 | Disposition: A | Discharge status: Home / Self Care | Payer: Medicare Other | Source: Ambulatory Visit | Attending: Family Medicine | Admitting: Family Medicine

## 2021-10-30 DIAGNOSIS — Z20822 Contact with and (suspected) exposure to covid-19: Secondary | ICD-10-CM | POA: Insufficient documentation

## 2021-10-30 DIAGNOSIS — Z01812 Encounter for preprocedural laboratory examination: Secondary | ICD-10-CM | POA: Insufficient documentation

## 2021-10-31 LAB — SARS CORONAVIRUS 2 (TAT 6-24 HRS): SARS Coronavirus 2: NEGATIVE

## 2021-11-02 ENCOUNTER — Encounter
Admission: RE | Disposition: A | Discharge status: Home / Self Care | Payer: Self-pay | Source: Home / Self Care | Attending: Urology

## 2021-11-02 ENCOUNTER — Other Ambulatory Visit: Payer: Self-pay

## 2021-11-02 ENCOUNTER — Encounter: Payer: Self-pay | Admitting: Urology

## 2021-11-02 ENCOUNTER — Ambulatory Visit
Admission: RE | Admit: 2021-11-02 | Discharge: 2021-11-02 | Disposition: A | Discharge status: Home / Self Care | Payer: Medicare Other | Attending: Urology | Admitting: Urology

## 2021-11-02 DIAGNOSIS — Z538 Procedure and treatment not carried out for other reasons: Secondary | ICD-10-CM | POA: Diagnosis not present

## 2021-11-02 DIAGNOSIS — N329 Bladder disorder, unspecified: Secondary | ICD-10-CM | POA: Diagnosis not present

## 2021-11-02 DIAGNOSIS — N390 Urinary tract infection, site not specified: Secondary | ICD-10-CM | POA: Diagnosis not present

## 2021-11-02 HISTORY — DX: Iron deficiency anemia, unspecified: D50.9

## 2021-11-02 HISTORY — DX: Atherosclerosis of aorta: I70.0

## 2021-11-02 HISTORY — DX: Hyperlipidemia, unspecified: E78.5

## 2021-11-02 HISTORY — DX: Left bundle-branch block, unspecified: I44.7

## 2021-11-02 LAB — URINALYSIS, COMPLETE (UACMP) WITH MICROSCOPIC
Bilirubin Urine: NEGATIVE
Glucose, UA: NEGATIVE mg/dL
Ketones, ur: NEGATIVE mg/dL
Nitrite: NEGATIVE
Protein, ur: NEGATIVE mg/dL
Specific Gravity, Urine: 1.02 (ref 1.005–1.030)
pH: 5.5 (ref 5.0–8.0)

## 2021-11-02 SURGERY — CYSTOSCOPY, WITH BIOPSY
Anesthesia: General

## 2021-11-02 MED ORDER — CHLORHEXIDINE GLUCONATE 0.12 % MT SOLN
OROMUCOSAL | Status: AC
  Administered 2021-11-02: 15 mL via OROMUCOSAL
  Filled 2021-11-02: qty 15

## 2021-11-02 MED ORDER — FAMOTIDINE 20 MG PO TABS
ORAL_TABLET | ORAL | Status: AC
  Administered 2021-11-02: 20 mg via ORAL
  Filled 2021-11-02: qty 1

## 2021-11-02 MED ORDER — PROPOFOL 10 MG/ML IV BOLUS
INTRAVENOUS | Status: AC
  Filled 2021-11-02: qty 20

## 2021-11-02 MED ORDER — ORAL CARE MOUTH RINSE: 15.0000 mL | Freq: Once | OROMUCOSAL | Status: AC

## 2021-11-02 MED ORDER — LIDOCAINE HCL (PF) 2 % IJ SOLN
INTRAMUSCULAR | Status: AC
  Filled 2021-11-02: qty 5

## 2021-11-02 MED ORDER — FAMOTIDINE 20 MG PO TABS: 20.0000 mg | ORAL_TABLET | Freq: Once | ORAL | Status: AC

## 2021-11-02 MED ORDER — LACTATED RINGERS IV SOLN: INTRAVENOUS | Status: DC

## 2021-11-02 MED ORDER — CEFAZOLIN SODIUM-DEXTROSE 2-4 GM/100ML-% IV SOLN: 2.0000 g | INTRAVENOUS | Status: DC

## 2021-11-02 MED ORDER — CEFAZOLIN SODIUM-DEXTROSE 2-4 GM/100ML-% IV SOLN
INTRAVENOUS | Status: AC
  Filled 2021-11-02: qty 100

## 2021-11-02 MED ORDER — CHLORHEXIDINE GLUCONATE 0.12 % MT SOLN: 15.0000 mL | Freq: Once | OROMUCOSAL | Status: AC

## 2021-11-02 SURGICAL SUPPLY — 19 items
BAG DRAIN CYSTO-URO LG1000N (MISCELLANEOUS) ×2 IMPLANT
BRUSH SCRUB EZ  4% CHG (MISCELLANEOUS) ×1
BRUSH SCRUB EZ 4% CHG (MISCELLANEOUS) ×1 IMPLANT
DRSG TELFA 4X3 1S NADH ST (GAUZE/BANDAGES/DRESSINGS) ×2 IMPLANT
ELECT REM PT RETURN 9FT ADLT (ELECTROSURGICAL) ×2
ELECTRODE REM PT RTRN 9FT ADLT (ELECTROSURGICAL) ×1 IMPLANT
GAUZE 4X4 16PLY ~~LOC~~+RFID DBL (SPONGE) ×4 IMPLANT
GLOVE SURG ENC MOIS LTX SZ6.5 (GLOVE) ×2 IMPLANT
GOWN STRL REUS W/ TWL LRG LVL3 (GOWN DISPOSABLE) ×2 IMPLANT
GOWN STRL REUS W/TWL LRG LVL3 (GOWN DISPOSABLE) ×2
KIT TURNOVER CYSTO (KITS) ×2 IMPLANT
NDL SAFETY ECLIPSE 18X1.5 (NEEDLE) ×1 IMPLANT
NEEDLE HYPO 18GX1.5 SHARP (NEEDLE) ×1
PACK CYSTO AR (MISCELLANEOUS) ×2 IMPLANT
SET CYSTO W/LG BORE CLAMP LF (SET/KITS/TRAYS/PACK) ×2 IMPLANT
SURGILUBE 2OZ TUBE FLIPTOP (MISCELLANEOUS) ×2 IMPLANT
WATER STERILE IRR 1000ML POUR (IV SOLUTION) ×2 IMPLANT
WATER STERILE IRR 3000ML UROMA (IV SOLUTION) ×2 IMPLANT
WATER STERILE IRR 500ML POUR (IV SOLUTION) ×2 IMPLANT

## 2021-11-02 NOTE — Progress Notes (Signed)
UA came back positive for a UTI.  Dr. Erlene Quan came out and talked to the patient and informed her the surgery would be canceled and rescheduled.  She said she would send the urine for culture then would call her in a prescription outpatient.  I removed patients IV and going to walk her out to her friend.

## 2021-11-02 NOTE — Anesthesia Preprocedure Evaluation (Signed)
Anesthesia Evaluation  Patient identified by MRN, date of birth, ID band Patient awake    Reviewed: Allergy & Precautions, NPO status , Patient's Chart, lab work & pertinent test results  History of Anesthesia Complications Negative for: history of anesthetic complications  Airway Mallampati: III  TM Distance: >3 FB Neck ROM: full    Dental  (+) Chipped, Poor Dentition, Missing   Pulmonary neg shortness of breath, sleep apnea , former smoker,    Pulmonary exam normal        Cardiovascular Exercise Tolerance: Good hypertension, (-) angina+ CAD  Normal cardiovascular exam+ dysrhythmias      Neuro/Psych CVA, Residual Symptoms negative psych ROS   GI/Hepatic negative GI ROS, Neg liver ROS, neg GERD  ,  Endo/Other  negative endocrine ROS  Renal/GU Renal disease  negative genitourinary   Musculoskeletal  (+) Arthritis ,   Abdominal   Peds  Hematology negative hematology ROS (+)   Anesthesia Other Findings Past Medical History: No date: Aortic atherosclerosis ([place]) No date: Arthritis     Comment:  BACK-LUMBAR 10/16/2020: Ascending aorta dilation (HCC)     Comment:  a.) TTE 10/16/2020 --> borderline at 37 mm. No date: Atrial fibrillation (HCC)     Comment:  a.) CHA2DS2-VASc = 5 (sex, HTN, CVA x 2, aortic plaque).              b.) s/p ablation in 2008. c.) rate/rhythm maintained on               oral amiodarone; not currently on daily anticoagulation. No date: Coronary artery disease No date: Duodenitis No date: Gastritis No date: HLD (hyperlipidemia) No date: Hypertension No date: IDA (iron deficiency anemia) No date: LBBB (left bundle branch block) 04/29/2016: Mass of left submandibular region     Comment:  a.) FNA 04/29/2016 --> Bx (+) for pleomorphic adenoma;               s/p resection 07/24/2020: PAH (pulmonary artery hypertension) ([place])     Comment:  a.) CT 07/24/2020 showed enlarged pulmonary artery                consistent with PAH. 2016: Pneumonia No date: Sleep apnea 2012: Stroke [facility])  Past Surgical History: 04/29/2016: BIOPSY SALIVARY GLAND; Left     Comment:  Procedure: SUBMANDIBULAR GLAND BIOPSY (FNA); Location:               [place]; Surgeon: Markus Daft, MD (IR) No date: cardiac catherization 2008: [place]; N/A     Comment:  Procedure: ATRIAL FIBRILLATION ABLATION; Location:               [facility] 11/18/2015: COLONOSCOPY WITH PROPOFOL; N/A     Comment:  Procedure: COLONOSCOPY WITH PROPOFOL;  Surgeon: Lucilla Lame, MD;  Location: [facility] ENDOSCOPY;  Service: Endoscopy;              Laterality: N/A; 12/02/2020: COLONOSCOPY WITH PROPOFOL; N/A     Comment:  Procedure: COLONOSCOPY WITH PROPOFOL;  Surgeon: Lucilla Lame, MD;  Location: [facility] ENDOSCOPY;  Service:               Endoscopy;  Laterality: N/A; No date: CORONARY ANGIOPLASTY 04/23/2016: ESOPHAGOGASTRODUODENOSCOPY (EGD) WITH PROPOFOL; N/A     Comment:  Procedure: ESOPHAGOGASTRODUODENOSCOPY (EGD) WITH  PROPOFOL;  Surgeon: Lucilla Lame, MD;  Location: [place];  Service: Endoscopy;  Laterality: N/A;                small bowel bx gastric antrum bx 09/08/2017: SUBMANDIBULAR GLAND EXCISION; Left     Comment:  Procedure: EXCISION SUBMANDIBULAR GLAND;  Surgeon:               Carloyn Manner, MD;  Location: [facility] ORS;  Service:               ENT;  Laterality: Left; No date: TOTAL ABDOMINAL HYSTERECTOMY W/ BILATERAL SALPINGOOPHORECTOMY     Comment:  Total  BMI    Body Mass Index: 30.18 kg/m      Reproductive/Obstetrics negative OB ROS                             Anesthesia Physical Anesthesia Plan  ASA: 3  Anesthesia Plan: General   Post-op Pain Management:    Induction: Intravenous  PONV Risk Score and Plan: Propofol infusion and TIVA  Airway Management Planned:  Natural Airway and Nasal Cannula  Additional Equipment:   Intra-op Plan:   Post-operative Plan:   Informed Consent: I have reviewed the patients History and Physical, chart, labs and discussed the procedure including the risks, benefits and alternatives for the proposed anesthesia with the patient or authorized representative who has indicated his/her understanding and acceptance.     Dental Advisory Given  Plan Discussed with: Anesthesiologist, CRNA and Surgeon  Anesthesia Plan Comments: (Patient consented for risks of anesthesia including but not limited to:  - adverse reactions to medications - risk of airway placement if required - damage to eyes, teeth, lips or other oral mucosa - nerve damage due to positioning  - sore throat or hoarseness - Damage to heart, brain, nerves, lungs, other parts of body or loss of life  Patient voiced understanding.)        Anesthesia Quick Evaluation

## 2021-11-02 NOTE — Progress Notes (Signed)
Dr. Erlene Quan came by to see the patient prior to surgery.  Patient had not had a UA done for a while, so she asked [place] to collect a UA.  The patient had voided right when she go on the unit, so she could not at that time.  Bladder scan only showed 39cc.  Dr. Erlene Quan said to start the IV and give a 500cc bolus from the LR fluid.  I acknowledged and started the IV and fluids.  Patient just voided a small amount.  Urine was sent to the lab with results pending.

## 2021-11-03 ENCOUNTER — Ambulatory Visit: Payer: Medicare Other | Attending: Neurology

## 2021-11-03 DIAGNOSIS — G4733 Obstructive sleep apnea (adult) (pediatric): Secondary | ICD-10-CM | POA: Insufficient documentation

## 2021-11-04 ENCOUNTER — Other Ambulatory Visit: Payer: Self-pay

## 2021-11-04 ENCOUNTER — Other Ambulatory Visit: Payer: Self-pay | Admitting: *Deleted

## 2021-11-04 DIAGNOSIS — D500 Iron deficiency anemia secondary to blood loss (chronic): Secondary | ICD-10-CM

## 2021-11-04 LAB — URINE CULTURE

## 2021-11-09 ENCOUNTER — Telehealth: Payer: Self-pay

## 2021-11-09 ENCOUNTER — Ambulatory Visit (INDEPENDENT_AMBULATORY_CARE_PROVIDER_SITE_OTHER): Payer: Medicare Other | Admitting: *Deleted

## 2021-11-09 DIAGNOSIS — Z0000 Encounter for general adult medical examination without abnormal findings: Secondary | ICD-10-CM | POA: Diagnosis not present

## 2021-11-09 NOTE — Telephone Encounter (Signed)
Incoming call from pt who states she is awaiting the results of her urine cx and would like to know next steps in order to proceed with surgery.   Appears specimen needs recollection, ok to schedule lab UA/CX drop off? Please advise.

## 2021-11-09 NOTE — Patient Instructions (Addendum)
[name] , Thank you for taking time to come for your Medicare Wellness Visit. I appreciate your ongoing commitment to your health goals. Please review the following plan we discussed and let me know if I can assist you in the future.   Screening recommendations/referrals: Colonoscopy: up to date Mammogram: up to date Bone Density: up to date Recommended yearly ophthalmology/optometry visit for glaucoma screening and checkup Recommended yearly dental visit for hygiene and checkup  Vaccinations: Influenza vaccine: Education provided Pneumococcal vaccine: Education provided Tdap vaccine: up to date Shingles vaccine: Education provided    Advanced directives: Education provided  Conditions/risks identified:   Next appointment: 02-04-2022 @ 11:00  [facility] 40-64 Years and Older, Female Preventive care refers to lifestyle choices and visits with your health care provider that can promote health and wellness. What does preventive care include? A yearly physical exam. This is also called an annual well check. Dental exams once or twice a year. Routine eye exams. Ask your health care provider how often you should have your eyes checked. Personal lifestyle choices, including: Daily care of your teeth and gums. Regular physical activity. Eating a healthy diet. Avoiding tobacco and drug use. Limiting alcohol use. Practicing safe sex. Taking low-dose aspirin every day. Taking vitamin and mineral supplements as recommended by your health care provider. What happens during an annual well check? The services and screenings done by your health care provider during your annual well check will depend on your age, overall health, lifestyle risk factors, and family history of disease. Counseling  Your health care provider may ask you questions about your: Alcohol use. Tobacco use. Drug use. Emotional well-being. Home and relationship well-being. Sexual activity. Eating  habits. History of falls. Memory and ability to understand (cognition). Work and work Statistician. Reproductive health. Screening  You may have the following tests or measurements: Height, weight, and BMI. Blood pressure. Lipid and cholesterol levels. These may be checked every 5 years, or more frequently if you are over 3 years old. Skin check. Lung cancer screening. You may have this screening every year starting at age 110 if you have a 30-pack-year history of smoking and currently smoke or have quit within the past 15 years. Fecal occult blood test (FOBT) of the stool. You may have this test every year starting at age 91. Flexible sigmoidoscopy or colonoscopy. You may have a sigmoidoscopy every 5 years or a colonoscopy every 10 years starting at age 48. Hepatitis C blood test. Hepatitis B blood test. Sexually transmitted disease (STD) testing. Diabetes screening. This is done by checking your blood sugar (glucose) after you have not eaten for a while (fasting). You may have this done every 1-3 years. Bone density scan. This is done to screen for osteoporosis. You may have this done starting at age 31. Mammogram. This may be done every 1-2 years. Talk to your health care provider about how often you should have regular mammograms. Talk with your health care provider about your test results, treatment options, and if necessary, the need for more tests. Vaccines  Your health care provider may recommend certain vaccines, such as: Influenza vaccine. This is recommended every year. Tetanus, diphtheria, and acellular pertussis (Tdap, Td) vaccine. You may need a Td booster every 10 years. Zoster vaccine. You may need this after age 43. Pneumococcal 13-valent conjugate (PCV13) vaccine. One dose is recommended after age 63. Pneumococcal polysaccharide (PPSV23) vaccine. One dose is recommended after age 23. Talk to your health care provider about  which screenings and vaccines you need and how  often you need them. This information is not intended to replace advice given to you by your health care provider. Make sure you discuss any questions you have with your health care provider. Document Released: 10/10/2015 Document Revised: 06/02/2016 Document Reviewed: 07/15/2015 Elsevier Interactive Patient Education  2017 [place] Prevention in the Home Falls can cause injuries. They can happen to people of all ages. There are many things you can do to make your home safe and to help prevent falls. What can I do on the outside of my home? Regularly fix the edges of walkways and driveways and fix any cracks. Remove anything that might make you trip as you walk through a door, such as a raised step or threshold. Trim any bushes or trees on the path to your home. Use bright outdoor lighting. Clear any walking paths of anything that might make someone trip, such as rocks or tools. Regularly check to see if handrails are loose or broken. Make sure that both sides of any steps have handrails. Any raised decks and porches should have guardrails on the edges. Have any leaves, snow, or ice cleared regularly. Use sand or salt on walking paths during winter. Clean up any spills in your garage right away. This includes oil or grease spills. What can I do in the bathroom? Use night lights. Install grab bars by the toilet and in the tub and shower. Do not use towel bars as grab bars. Use non-skid mats or decals in the tub or shower. If you need to sit down in the shower, use a plastic, non-slip stool. Keep the floor dry. Clean up any water that spills on the floor as soon as it happens. Remove soap buildup in the tub or shower regularly. Attach bath mats securely with double-sided non-slip rug tape. Do not have throw rugs and other things on the floor that can make you trip. What can I do in the bedroom? Use night lights. Make sure that you have a light by your bed that is easy to  reach. Do not use any sheets or blankets that are too big for your bed. They should not hang down onto the floor. Have a firm chair that has side arms. You can use this for support while you get dressed. Do not have throw rugs and other things on the floor that can make you trip. What can I do in the kitchen? Clean up any spills right away. Avoid walking on wet floors. Keep items that you use a lot in easy-to-reach places. If you need to reach something above you, use a strong step stool that has a grab bar. Keep electrical cords out of the way. Do not use floor polish or wax that makes floors slippery. If you must use wax, use non-skid floor wax. Do not have throw rugs and other things on the floor that can make you trip. What can I do with my stairs? Do not leave any items on the stairs. Make sure that there are handrails on both sides of the stairs and use them. Fix handrails that are broken or loose. Make sure that handrails are as long as the stairways. Check any carpeting to make sure that it is firmly attached to the stairs. Fix any carpet that is loose or worn. Avoid having throw rugs at the top or bottom of the stairs. If you do have throw rugs, attach them to the floor with  carpet tape. Make sure that you have a light switch at the top of the stairs and the bottom of the stairs. If you do not have them, ask someone to add them for you. What else can I do to help prevent falls? Wear shoes that: Do not have high heels. Have rubber bottoms. Are comfortable and fit you well. Are closed at the toe. Do not wear sandals. If you use a stepladder: Make sure that it is fully opened. Do not climb a closed stepladder. Make sure that both sides of the stepladder are locked into place. Ask someone to hold it for you, if possible. Clearly mark and make sure that you can see: Any grab bars or handrails. First and last steps. Where the edge of each step is. Use tools that help you move  around (mobility aids) if they are needed. These include: Canes. Walkers. Scooters. Crutches. Turn on the lights when you go into a dark area. Replace any light bulbs as soon as they burn out. Set up your furniture so you have a clear path. Avoid moving your furniture around. If any of your floors are uneven, fix them. If there are any pets around you, be aware of where they are. Review your medicines with your doctor. Some medicines can make you feel dizzy. This can increase your chance of falling. Ask your doctor what other things that you can do to help prevent falls. This information is not intended to replace advice given to you by your health care provider. Make sure you discuss any questions you have with your health care provider. Document Released: 07/10/2009 Document Revised: 02/19/2016 Document Reviewed: 10/18/2014 Elsevier Interactive Patient Education  2017 [employer].

## 2021-11-09 NOTE — Telephone Encounter (Signed)
Melissa, please address  Hollice Espy, MD

## 2021-11-09 NOTE — Progress Notes (Signed)
m  Subjective:   Kristina Henson is a 66 y.o. female who presents for Medicare Annual (Subsequent) preventive examination.  I connected with  HANIAH PENNY on 11/09/21 by a telephone enabled telemedicine application and verified that I am speaking with the correct person using two identifiers.   I discussed the limitations of evaluation and management by telemedicine. The patient expressed understanding and agreed to proceed.  Patient location: home  Provider location:  Tele-Health not in office   Review of Systems     Cardiac Risk Factors include: advanced age (>68men, >47 women);hypertension;sedentary lifestyle     Objective:    Today's Vitals   There is no height or weight on file to calculate BMI.  Advanced Directives 11/09/2021 11/02/2021 10/26/2021 05/08/2021 12/02/2020 11/07/2020 08/08/2020  Does Patient Have a Medical Advance Directive? No No No No No No No  Does patient want to make changes to medical advance directive? - - - - - - -  Would patient like information on creating a medical advance directive? - No - Patient declined - No - Patient declined No - Patient declined - No - Patient declined    Current Medications (verified) Outpatient Encounter Medications as of 11/09/2021  Medication Sig   amiodarone (PACERONE) 200 MG tablet Take 1 tablet (200 mg total) by mouth every morning.   amLODipine (NORVASC) 2.5 MG tablet Take 1 tablet (2.5 mg total) by mouth daily.   Ascorbic Acid (VITAMIN C) 100 MG tablet Take 100 mg by mouth daily.   aspirin EC 81 MG tablet Take 81 mg by mouth daily.    baclofen (LIORESAL) 10 MG tablet Take 1 tablet (10 mg total) by mouth at bedtime as needed for muscle spasms.   CRANBERRY PO Take 1 capsule by mouth daily.   ferrous sulfate 325 (65 FE) MG EC tablet Take 1 tablet (325 mg total) by mouth 3 (three) times daily with meals. (Patient taking differently: Take 325 mg by mouth daily with breakfast.)   hydrALAZINE (APRESOLINE) 50 MG tablet Take 1  tablet (50 mg total) by mouth 2 (two) times daily.   lisinopril-hydrochlorothiazide (ZESTORETIC) 20-25 MG tablet Take 1 tablet by mouth 2 (two) times daily.   traZODone (DESYREL) 50 MG tablet Take 1-2 tablets (50-100 mg total) by mouth at bedtime as needed for sleep.   cephALEXin (KEFLEX) 500 MG capsule Take 1 capsule (500 mg total) by mouth 3 (three) times daily. (Patient not taking: Reported on 10/22/2021)   No facility-administered encounter medications on file as of 11/09/2021.    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   Atrial fibrillation ([place])    a.) CHA2DS2-VASc = 5 (sex, HTN, CVA x 2, aortic plaque). b.) s/p ablation in 2008. c.) rate/rhythm maintained on oral amiodarone; not currently on daily anticoagulation.   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   Pneumonia 2016   Sleep  apnea    Stroke [facility]) 2012   Past Surgical History:  Procedure Laterality Date   BIOPSY SALIVARY GLAND Left 04/29/2016   Procedure: SUBMANDIBULAR GLAND BIOPSY (FNA); Location: [facility]; Surgeon: Markus Daft, MD (IR)   cardiac catherization     CARDIAC ELECTROPHYSIOLOGY STUDY AND ABLATION N/A 2008   Procedure: ATRIAL FIBRILLATION ABLATION; Location: [facility]   COLONOSCOPY WITH PROPOFOL N/A 11/18/2015   Procedure: COLONOSCOPY WITH PROPOFOL;  Surgeon: Lucilla Lame, MD;  Location: [facility] ENDOSCOPY;  Service: Endoscopy;  Laterality:  N/A;   COLONOSCOPY WITH PROPOFOL N/A 12/02/2020   Procedure: COLONOSCOPY WITH PROPOFOL;  Surgeon: Lucilla Lame, MD;  Location: [facility] ENDOSCOPY;  Service: Endoscopy;  Laterality: N/A;   CORONARY ANGIOPLASTY     ESOPHAGOGASTRODUODENOSCOPY (EGD) WITH PROPOFOL N/A 04/23/2016   Procedure: ESOPHAGOGASTRODUODENOSCOPY (EGD) WITH PROPOFOL;  Surgeon: Lucilla Lame, MD;  Location: [place];  Service: Endoscopy;  Laterality: N/A;  small bowel bx gastric antrum bx   SUBMANDIBULAR GLAND EXCISION Left 09/08/2017   Procedure: EXCISION SUBMANDIBULAR GLAND;  Surgeon: Carloyn Manner, MD;  Location: [facility] ORS;  Service: ENT;  Laterality: Left;   TOTAL ABDOMINAL HYSTERECTOMY W/ BILATERAL SALPINGOOPHORECTOMY     Total   Family History  Problem Relation Age of Onset   Arthritis Mother    Cancer Mother    Hypertension Sister    Asthma Brother    Dementia Brother    Breast cancer Neg Hx    Kidney cancer Neg Hx    Bladder Cancer Neg Hx    Social History   Socioeconomic History   Marital status: Divorced    Spouse name: Not on file   Number of children: Not on file   Years of education: 12   Highest education level: 12th grade  Occupational History   Occupation: disability   Tobacco Use   Smoking status: Former    Types: Cigarettes    Quit date: 1978    Years since quitting: 45.1   Smokeless tobacco: Never   Tobacco comments:    didnt smoke much   Vaping Use   Vaping Use: Never used  Substance and Sexual Activity   Alcohol use: Yes    Alcohol/week: 1.0 standard drink    Types: 1 Glasses of wine per week    Comment: occasionally   Drug use: No   Sexual activity: Not Currently  Other Topics Concern   Not on file  Social History Narrative   Not on file   Social Determinants of Health   Financial Resource Strain: Low Risk    Difficulty of Paying Living Expenses: Not hard at all  Food Insecurity: No Food Insecurity   Worried About Charity fundraiser in the Last Year: Never true    [place] in the Last Year: Never true  Transportation Needs: No Transportation Needs   Lack of Transportation (Medical): No   Lack of Transportation (Non-Medical): No  Physical Activity: Inactive   Days of Exercise per Week: 0 days   Minutes of Exercise per Session: 0 min  Stress: No Stress Concern Present   Feeling of Stress : Not at all  Social Connections: Moderately Isolated   Frequency of Communication with Friends and Family: Twice a week   Frequency of Social Gatherings with Friends and Family: Once a week   Attends Religious Services: 1 to 4 times per year   Active Member of [employer] or Organizations: No   Attends Archivist Meetings: Not on file  Marital Status: Divorced    Tobacco Counseling Counseling given: Not Answered Tobacco comments: didnt smoke much    Clinical Intake:  Pre-visit preparation completed: Yes  Pain : No/denies pain     Nutritional Risks: None Diabetes: No  How often do you need to have someone help you when you read instructions, pamphlets, or other written materials from your doctor or pharmacy?: 1 - Never  Diabetic?  No  Interpreter Needed?: No  Information entered by :: Leroy Kennedy LPN   Activities of Daily Living In your present state of health, do you have any difficulty performing the following activities: 11/09/2021 10/26/2021  Hearing? N N  Vision? N N  Difficulty concentrating or making decisions? N N  Walking or climbing stairs? N N  Dressing or bathing? N N  Doing errands, shopping? N N  Preparing Food and eating ? N -  Using the Toilet? N -  In the past six months, have you accidently leaked urine? N -  Do you have problems with loss of bowel control? N -  Managing your Medications? N -  Managing your Finances? N -  Housekeeping or managing your Housekeeping? N -  Some recent data might be hidden    Patient Care Team: Valerie Roys, DO as PCP - General (Family Medicine) Ubaldo Glassing Javier Docker, MD as  Consulting Physician (Cardiology) Carloyn Manner, MD as Referring Physician (Otolaryngology) Minor, Dalbert Garnet, RN (Inactive) as [facility] Rogue Bussing, Elisha Headland, MD as Consulting Physician (Hematology and Oncology)  Indicate any recent Medical Services you may have received from other than [facility] providers in the past year (date may be approximate).     Assessment:   This is a routine wellness examination for [employer].  Hearing/Vision screen Hearing Screening - Comments:: No trouble hearing Vision Screening - Comments:: Not up to date [facility]  Dietary issues and exercise activities discussed: Current Exercise Habits: The patient does not participate in regular exercise at present, Exercise limited by: None identified   Goals Addressed             This Visit's Progress    DIET - INCREASE WATER INTAKE   On track    Recommend drinking at least 6-8 glasses of water a day      Patient Stated       Continue to keep drinking water       Depression Screen PHQ 2/9 Scores 11/09/2021 11/07/2020 10/31/2019 10/16/2018 04/03/2018 10/03/2017 07/19/2017  PHQ - 2 Score 0 0 0 0 0 0 0  PHQ- 9 Score - - - 0 0 - -    Fall Risk Fall Risk  11/09/2021 11/07/2020 10/31/2019 10/16/2018 04/03/2018  Falls in the past year? 0 1 0 0 No  Number falls in past yr: 0 - 0 0 -  Injury with Fall? 0 - 0 0 -  Risk for fall due to : - Medication side effect - - -  Follow up Falls evaluation completed;Falls prevention discussed Falls evaluation completed;Education provided;Falls prevention discussed - - -    FALL RISK PREVENTION PERTAINING TO THE HOME:  Any stairs in or around the home? Yes  If so, are there any without handrails? No  Home free of loose throw rugs in walkways, pet beds, electrical cords, etc? Yes  Adequate lighting in your home to reduce risk of falls? Yes   ASSISTIVE DEVICES UTILIZED TO PREVENT FALLS:  Life alert? No  Use of a cane, walker or w/c? No  Grab  bars in the bathroom? Yes  Shower chair or bench in shower? Yes  Elevated toilet seat or a handicapped toilet? No   TIMED UP AND GO:  Was the test performed? No .    Cognitive Function:  Normal cognitive status assessed by direct observation by this Nurse Health Advisor. No abnormalities found.       6CIT Screen 11/07/2020 10/16/2018 10/03/2017 08/11/2016  What Year? 4 points 0 points 0 points 0 points  What month? 0 points 0 points 0 points 0 points  What time? 0 points 0 points 0 points 0 points  Count back from 20 2 points 0 points 0 points 2 points  Months in reverse 4 points 0 points 0 points 0 points  Repeat phrase 6 points 4 points 0 points 2 points  Total Score 16 4 0 4    Immunizations Immunization History  Administered Date(s) Administered   Influenza,inj,Quad PF,6+ Mos 06/25/2015, 06/16/2017, 06/09/2018, 07/08/2019   Influenza-Unspecified 06/29/2016, 05/28/2020   Moderna Sars-Covid-2 Vaccination 11/15/2019, 12/13/2019   Pneumococcal Polysaccharide-23 07/29/2015   Td 01/27/2015    TDAP status: Up to date  Flu Vaccine status: Due, Education has been provided regarding the importance of this vaccine. Advised may receive this vaccine at local pharmacy or Health Dept. Aware to provide a copy of the vaccination record if obtained from local pharmacy or Health Dept. Verbalized acceptance and understanding.  Pneumococcal vaccine status: Due, Education has been provided regarding the importance of this vaccine. Advised may receive this vaccine at local pharmacy or Health Dept. Aware to provide a copy of the vaccination record if obtained from local pharmacy or Health Dept. Verbalized acceptance and understanding.  Covid-19 vaccine status: Information provided on how to obtain vaccines.   Qualifies for Shingles Vaccine? Yes   Zostavax completed No   Shingrix Completed?: No.    Education has been provided regarding the importance of this vaccine. Patient has been advised to  call insurance company to determine out of pocket expense if they have not yet received this vaccine. Advised may also receive vaccine at local pharmacy or Health Dept. Verbalized acceptance and understanding.  Screening Tests Health Maintenance  Topic Date Due   COVID-19 Vaccine (3 - Booster for Moderna series) 11/25/2021 (Originally 02/07/2020)   INFLUENZA VACCINE  12/25/2021 (Originally 04/27/2021)   Zoster Vaccines- Shingrix (1 of 2) 02/06/2022 (Originally 12/06/2007)   MAMMOGRAM  10/27/2022   TETANUS/TDAP  01/26/2025   COLONOSCOPY (Pts 45-66yrs Insurance coverage will need to be confirmed)  12/03/2027   Hepatitis C Screening  Completed   HIV Screening  Completed   HPV VACCINES  Aged Out    Health Maintenance  There are no preventive care reminders to display for this patient.   Colorectal cancer screening: Type of screening: Colonoscopy. Completed 2022. Repeat every 7 years  Mammogram scheduled 11-16-2021  Bone Density status: Completed 2016. Results reflect: Bone density results: NORMAL. Repeat every per patient normal years.  Lung Cancer Screening: (Low Dose CT Chest recommended if Age 32-80 years, 30 pack-year currently smoking OR have quit w/in 15years.) does not qualify.   Lung Cancer Screening Referral:   Additional Screening:  Hepatitis C Screening: does not qualify; Completed 2018  Vision Screening: Recommended annual ophthalmology exams for early detection of glaucoma and other disorders of the eye. Is the patient up to date with their annual eye exam?  No  Who is the provider or what is the name of the office in which the patient attends annual  eye exams? [facility] If pt is not established with a provider, would they like to be referred to a provider to establish care? No .   Dental Screening: Recommended annual dental exams for proper oral hygiene  Community Resource Referral / Chronic Care Management: CRR required this visit?  No   CCM required this visit?   No      Plan:     I have personally reviewed and noted the following in the patients chart:   Medical and social history Use of alcohol, tobacco or illicit drugs  Current medications and supplements including opioid prescriptions.  Functional ability and status Nutritional status Physical activity Advanced directives List of other physicians Hospitalizations, surgeries, and ER visits in previous 12 months Vitals Screenings to include cognitive, depression, and falls Referrals and appointments  In addition, I have reviewed and discussed with patient certain preventive protocols, quality metrics, and best practice recommendations. A written personalized care plan for preventive services as well as general preventive health recommendations were provided to patient.     Leroy Kennedy, LPN   [id number]   Nurse Notes:

## 2021-11-09 NOTE — Telephone Encounter (Signed)
Pt c/o urinary frequency and urgency.

## 2021-11-09 NOTE — Progress Notes (Signed)
m  Subjective:   Kristina Henson is a 66 y.o. female who presents for Medicare Annual (Subsequent) preventive examination.  I connected with  [name] on 11/09/21 by a telephone enabled telemedicine application and verified that I am speaking with the correct person using two identifiers.   I discussed the limitations of evaluation and management by telemedicine. The patient expressed understanding and agreed to proceed.  Patient location: home  Provider location:  Tele-Health not in office   Review of Systems     Cardiac Risk Factors include: advanced age (>27men, >44 women);hypertension;sedentary lifestyle     Objective:    Today's Vitals   There is no height or weight on file to calculate BMI.  Advanced Directives 11/09/2021 11/02/2021 10/26/2021 05/08/2021 12/02/2020 11/07/2020 08/08/2020  Does Patient Have a Medical Advance Directive? No No No No No No No  Does patient want to make changes to medical advance directive? - - - - - - -  Would patient like information on creating a medical advance directive? - No - Patient declined - No - Patient declined No - Patient declined - No - Patient declined    Current Medications (verified) Outpatient Encounter Medications as of 11/09/2021  Medication Sig   amiodarone (PACERONE) 200 MG tablet Take 1 tablet (200 mg total) by mouth every morning.   amLODipine (NORVASC) 2.5 MG tablet Take 1 tablet (2.5 mg total) by mouth daily.   Ascorbic Acid (VITAMIN C) 100 MG tablet Take 100 mg by mouth daily.   aspirin EC 81 MG tablet Take 81 mg by mouth daily.    baclofen (LIORESAL) 10 MG tablet Take 1 tablet (10 mg total) by mouth at bedtime as needed for muscle spasms.   CRANBERRY PO Take 1 capsule by mouth daily.   ferrous sulfate 325 (65 FE) MG EC tablet Take 1 tablet (325 mg total) by mouth 3 (three) times daily with meals. (Patient taking differently: Take 325 mg by mouth daily with breakfast.)   hydrALAZINE (APRESOLINE) 50 MG tablet  Take 1 tablet (50 mg total) by mouth 2 (two) times daily.   lisinopril-hydrochlorothiazide (ZESTORETIC) 20-25 MG tablet Take 1 tablet by mouth 2 (two) times daily.   traZODone (DESYREL) 50 MG tablet Take 1-2 tablets (50-100 mg total) by mouth at bedtime as needed for sleep.   cephALEXin (KEFLEX) 500 MG capsule Take 1 capsule (500 mg total) by mouth 3 (three) times daily. (Patient not taking: Reported on 10/22/2021)   No facility-administered encounter medications on file as of 11/09/2021.    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   Atrial fibrillation ([place])    a.) CHA2DS2-VASc = 5 (sex, HTN, CVA x 2, aortic plaque). b.) s/p ablation in 2008. c.) rate/rhythm maintained on oral amiodarone; not currently on daily anticoagulation.   Coronary artery disease    Duodenitis    Gastritis    HLD (hyperlipidemia)    Hypertension    IDA (iron deficiency anemia)    LBBB (left bundle branch block)    Mass of left submandibular region 04/29/2016   a.) FNA 04/29/2016 --> Bx (+) for pleomorphic adenoma; s/p resection   PAH (pulmonary artery hypertension) ([place]) 07/24/2020   a.) CT 07/24/2020 showed enlarged pulmonary artery consistent with PAH.   Pneumonia 2016   Sleep  apnea    Stroke [facility]) 2012   Past Surgical History:  Procedure Laterality Date   BIOPSY SALIVARY GLAND Left 04/29/2016   Procedure: SUBMANDIBULAR GLAND BIOPSY (FNA); Location: [place]; Surgeon: Markus Daft, MD (IR)   cardiac catherization     CARDIAC ELECTROPHYSIOLOGY STUDY AND ABLATION N/A 2008   Procedure: ATRIAL FIBRILLATION ABLATION; Location: [facility]   COLONOSCOPY WITH PROPOFOL N/A 11/18/2015   Procedure: COLONOSCOPY WITH PROPOFOL;  Surgeon: Lucilla Lame, MD;  Location: [facility] ENDOSCOPY;   Service: Endoscopy;  Laterality: N/A;   COLONOSCOPY WITH PROPOFOL N/A 12/02/2020   Procedure: COLONOSCOPY WITH PROPOFOL;  Surgeon: Lucilla Lame, MD;  Location[facility] ENDOSCOPY;  Service: Endoscopy;  Laterality: N/A;   CORONARY ANGIOPLASTY     ESOPHAGOGASTRODUODENOSCOPY (EGD) WITH PROPOFOL N/A 04/23/2016   Procedure: ESOPHAGOGASTRODUODENOSCOPY (EGD) WITH PROPOFOL;  Surgeon: Lucilla Lame, MD;  Location: [place];  Service: Endoscopy;  Laterality: N/A;  small bowel bx gastric antrum bx   SUBMANDIBULAR GLAND EXCISION Left 09/08/2017   Procedure: EXCISION SUBMANDIBULAR GLAND;  Surgeon: Carloyn Manner, MD;  Location: [facility] ORS;  Service: ENT;  Laterality: Left;   TOTAL ABDOMINAL HYSTERECTOMY W/ BILATERAL SALPINGOOPHORECTOMY     Total   Family History  Problem Relation Age of Onset   Arthritis Mother    Cancer Mother    Hypertension Sister    Asthma Brother    Dementia Brother    Breast cancer Neg Hx    Kidney cancer Neg Hx    Bladder Cancer Neg Hx    Social History   Socioeconomic History   Marital status: Divorced    Spouse name: Not on file   Number of children: Not on file   Years of education: 12   Highest education level: 12th grade  Occupational History   Occupation: disability   Tobacco Use   Smoking status: Former    Types: Cigarettes    Quit date: 1978    Years since quitting: 45.1   Smokeless tobacco: Never   Tobacco comments:    didnt smoke much   Vaping Use   Vaping Use: Never used  Substance and Sexual Activity   Alcohol use: Yes    Alcohol/week: 1.0 standard drink    Types: 1 Glasses of wine per week    Comment: occasionally   Drug use: No   Sexual activity: Not Currently  Other Topics Concern   Not on file  Social History Narrative   Not on file   Social Determinants of Health   Financial Resource Strain: Low Risk    Difficulty of Paying Living Expenses: Not hard at all  Food Insecurity: No Food Insecurity    Worried About Charity fundraiser in the Last Year: Never true   [place] in the Last Year: Never true  Transportation Needs: No Transportation Needs   Lack of Transportation (Medical): No   Lack of Transportation (Non-Medical): No  Physical Activity: Inactive   Days of Exercise per Week: 0 days   Minutes of Exercise per Session: 0 min  Stress: No Stress Concern Present   Feeling of Stress : Not at all  Social Connections: Moderately Isolated   Frequency of Communication with Friends and Family: Twice a week   Frequency of Social Gatherings with Friends and Family: Once a week   Attends Religious Services: 1 to 4 times per year   Active Member of [employer] or Organizations: No   Attends Archivist Meetings: Not on file  Marital Status: Divorced    Tobacco Counseling Counseling given: Not Answered Tobacco comments: didnt smoke much    Clinical Intake:  Pre-visit preparation completed: Yes  Pain : No/denies pain     Nutritional Risks: None Diabetes: No  How often do you need to have someone help you when you read instructions, pamphlets, or other written materials from your doctor or pharmacy?: 1 - Never  Diabetic?  No  Interpreter Needed?: No  Information entered by :: Leroy Kennedy LPN   Activities of Daily Living In your present state of health, do you have any difficulty performing the following activities: 11/09/2021 10/26/2021  Hearing? N N  Vision? N N  Difficulty concentrating or making decisions? N N  Walking or climbing stairs? N N  Dressing or bathing? N N  Doing errands, shopping? N N  Preparing Food and eating ? N -  Using the Toilet? N -  In the past six months, have you accidently leaked urine? N -  Do you have problems with loss of bowel control? N -  Managing your Medications? N -  Managing your Finances? N -  Housekeeping or managing your Housekeeping? N -  Some recent data might be hidden    Patient Care  Team: Valerie Roys, DO as PCP - General (Family Medicine) Ubaldo Glassing Javier Docker, MD as Consulting Physician (Cardiology) Carloyn Manner, MD as Referring Physician (Otolaryngology) Minor, Dalbert Garnet, RN (Inactive) as [place] Management Rogue Bussing, Elisha Headland, MD as Consulting Physician (Hematology and Oncology)  Indicate any recent Medical Services you may have received from other than [facility] providers in the past year (date may be approximate).     Assessment:   This is a routine wellness examination for Jaelyne.  Hearing/Vision screen Hearing Screening - Comments:: No trouble hearing Vision Screening - Comments:: Not up to date [facility]  Dietary issues and exercise activities discussed: Current Exercise Habits: The patient does not participate in regular exercise at present, Exercise limited by: None identified   Goals Addressed             This Visit's Progress    DIET - INCREASE WATER INTAKE   On track    Recommend drinking at least 6-8 glasses of water a day      Patient Stated       Continue to keep drinking water       Depression Screen PHQ 2/9 Scores 11/09/2021 11/07/2020 10/31/2019 10/16/2018 04/03/2018 10/03/2017 07/19/2017  PHQ - 2 Score 0 0 0 0 0 0 0  PHQ- 9 Score - - - 0 0 - -    Fall Risk Fall Risk  11/09/2021 11/07/2020 10/31/2019 10/16/2018 04/03/2018  Falls in the past year? 0 1 0 0 No  Number falls in past yr: 0 - 0 0 -  Injury with Fall? 0 - 0 0 -  Risk for fall due to : - Medication side effect - - -  Follow up Falls evaluation completed;Falls prevention discussed Falls evaluation completed;Education provided;Falls prevention discussed - - -    FALL RISK PREVENTION PERTAINING TO THE HOME:  Any stairs in or around the home? Yes  If so, are there any without handrails? No  Home free of loose throw rugs in walkways, pet beds, electrical cords, etc? Yes  Adequate lighting in your home to reduce risk of falls? Yes   ASSISTIVE  DEVICES UTILIZED TO PREVENT FALLS:  Life alert? No  Use of a cane, walker or w/c?  No  Grab bars in the bathroom? Yes  Shower chair or bench in shower? Yes  Elevated toilet seat or a handicapped toilet? No   TIMED UP AND GO:  Was the test performed? No .    Cognitive Function:  Normal cognitive status assessed by direct observation by this Nurse Health Advisor. No abnormalities found.       6CIT Screen 11/07/2020 10/16/2018 10/03/2017 08/11/2016  What Year? 4 points 0 points 0 points 0 points  What month? 0 points 0 points 0 points 0 points  What time? 0 points 0 points 0 points 0 points  Count back from 20 2 points 0 points 0 points 2 points  Months in reverse 4 points 0 points 0 points 0 points  Repeat phrase 6 points 4 points 0 points 2 points  Total Score 16 4 0 4    Immunizations Immunization History  Administered Date(s) Administered   Influenza,inj,Quad PF,6+ Mos 06/25/2015, 06/16/2017, 06/09/2018, 07/08/2019   Influenza-Unspecified 06/29/2016, 05/28/2020   Moderna Sars-Covid-2 Vaccination 11/15/2019, 12/13/2019   Pneumococcal Polysaccharide-23 07/29/2015   Td 01/27/2015    TDAP status: Up to date  Flu Vaccine status: Due, Education has been provided regarding the importance of this vaccine. Advised may receive this vaccine at local pharmacy or Health Dept. Aware to provide a copy of the vaccination record if obtained from local pharmacy or Health Dept. Verbalized acceptance and understanding.  Pneumococcal vaccine status: Due, Education has been provided regarding the importance of this vaccine. Advised may receive this vaccine at local pharmacy or Health Dept. Aware to provide a copy of the vaccination record if obtained from local pharmacy or Health Dept. Verbalized acceptance and understanding.  Covid-19 vaccine status: Information provided on how to obtain vaccines.   Qualifies for Shingles Vaccine? Yes   Zostavax completed No   Shingrix Completed?: No.     Education has been provided regarding the importance of this vaccine. Patient has been advised to call insurance company to determine out of pocket expense if they have not yet received this vaccine. Advised may also receive vaccine at local pharmacy or Health Dept. Verbalized acceptance and understanding.  Screening Tests Health Maintenance  Topic Date Due   COVID-19 Vaccine (3 - Booster for Moderna series) 11/25/2021 (Originally 02/07/2020)   INFLUENZA VACCINE  12/25/2021 (Originally 04/27/2021)   Zoster Vaccines- Shingrix (1 of 2) 02/06/2022 (Originally 12/06/2007)   MAMMOGRAM  10/27/2022   TETANUS/TDAP  01/26/2025   COLONOSCOPY (Pts 45-61yrs Insurance coverage will need to be confirmed)  12/03/2027   Hepatitis C Screening  Completed   HIV Screening  Completed   HPV VACCINES  Aged Out    Health Maintenance  There are no preventive care reminders to display for this patient.   Colorectal cancer screening: Type of screening: Colonoscopy. Completed  . Repeat every 7 years  Mammogram status: Ordered  . Pt provided with contact info and advised to call to schedule appt.   Bone Density status: Completed 2016. Results reflect: Bone density results: NORMAL. Repeat every 0 years.  Lung Cancer Screening: (Low Dose CT Chest recommended if Age 38-80 years, 30 pack-year currently smoking OR have quit w/in 15years.) does not qualify.   Lung Cancer Screening Referral:   Additional Screening:  Hepatitis C Screening: does not qualify; Completed 2016  Vision Screening: Recommended annual ophthalmology exams for early detection of glaucoma and other disorders of the eye. Is the patient up to date with their annual eye exam?  No  Who is the  provider or what is the name of the office in which the patient attends annual eye exams?  If pt is not established with a provider, would they like to be referred to a provider to establish care? No .   Dental Screening: Recommended annual dental  exams for proper oral hygiene  Community Resource Referral / Chronic Care Management: CRR required this visit?  No   CCM required this visit?  No      Plan:     I have personally reviewed and noted the following in the patients chart:   Medical and social history Use of alcohol, tobacco or illicit drugs  Current medications and supplements including opioid prescriptions.  Functional ability and status Nutritional status Physical activity Advanced directives List of other physicians Hospitalizations, surgeries, and ER visits in previous 12 months Vitals Screenings to include cognitive, depression, and falls Referrals and appointments  In addition, I have reviewed and discussed with patient certain preventive protocols, quality metrics, and best practice recommendations. A written personalized care plan for preventive services as well as general preventive health recommendations were provided to patient.     Leroy Kennedy, LPN   03/30/8888   Nurse Notes:

## 2021-11-10 ENCOUNTER — Other Ambulatory Visit: Payer: Self-pay

## 2021-11-10 ENCOUNTER — Inpatient Hospital Stay: Payer: Medicare Other | Attending: Internal Medicine

## 2021-11-10 DIAGNOSIS — Z87891 Personal history of nicotine dependence: Secondary | ICD-10-CM | POA: Diagnosis not present

## 2021-11-10 DIAGNOSIS — Z79899 Other long term (current) drug therapy: Secondary | ICD-10-CM | POA: Diagnosis not present

## 2021-11-10 DIAGNOSIS — D649 Anemia, unspecified: Secondary | ICD-10-CM | POA: Diagnosis not present

## 2021-11-10 DIAGNOSIS — D500 Iron deficiency anemia secondary to blood loss (chronic): Secondary | ICD-10-CM

## 2021-11-10 DIAGNOSIS — Z7982 Long term (current) use of aspirin: Secondary | ICD-10-CM | POA: Insufficient documentation

## 2021-11-10 DIAGNOSIS — D696 Thrombocytopenia, unspecified: Secondary | ICD-10-CM | POA: Insufficient documentation

## 2021-11-10 LAB — CBC WITH DIFFERENTIAL/PLATELET
Abs Immature Granulocytes: 0.02 10*3/uL (ref 0.00–0.07)
Basophils Absolute: 0 10*3/uL (ref 0.0–0.1)
Basophils Relative: 1 %
Eosinophils Absolute: 0.1 10*3/uL (ref 0.0–0.5)
Eosinophils Relative: 3 %
HCT: 37.3 % (ref 36.0–46.0)
Hemoglobin: 12 g/dL (ref 12.0–15.0)
Immature Granulocytes: 1 %
Lymphocytes Relative: 25 %
Lymphs Abs: 0.7 10*3/uL (ref 0.7–4.0)
MCH: 28.2 pg (ref 26.0–34.0)
MCHC: 32.2 g/dL (ref 30.0–36.0)
MCV: 87.8 fL (ref 80.0–100.0)
Monocytes Absolute: 0.2 10*3/uL (ref 0.1–1.0)
Monocytes Relative: 8 %
Neutro Abs: 1.6 10*3/uL — ABNORMAL LOW (ref 1.7–7.7)
Neutrophils Relative %: 62 %
Platelets: 133 10*3/uL — ABNORMAL LOW (ref 150–400)
RBC: 4.25 MIL/uL (ref 3.87–5.11)
RDW: 14.8 % (ref 11.5–15.5)
WBC: 2.6 10*3/uL — ABNORMAL LOW (ref 4.0–10.5)
nRBC: 0 % (ref 0.0–0.2)

## 2021-11-10 LAB — BASIC METABOLIC PANEL
Anion gap: 6 (ref 5–15)
BUN: 26 mg/dL — ABNORMAL HIGH (ref 8–23)
CO2: 29 mmol/L (ref 22–32)
Calcium: 9.3 mg/dL (ref 8.9–10.3)
Chloride: 102 mmol/L (ref 98–111)
Creatinine, Ser: 0.86 mg/dL (ref 0.44–1.00)
GFR, Estimated: >60 mL/min (ref >60)
Glucose, Bld: 117 mg/dL — ABNORMAL HIGH (ref 70–99)
Potassium: 3.7 mmol/L (ref 3.5–5.1)
Sodium: 137 mmol/L (ref 135–145)

## 2021-11-10 LAB — FERRITIN: Ferritin: 112 ng/mL (ref 11–307)

## 2021-11-10 NOTE — Telephone Encounter (Signed)
Left message for patient to return my call.

## 2021-11-11 ENCOUNTER — Other Ambulatory Visit: Payer: Self-pay

## 2021-11-11 DIAGNOSIS — D500 Iron deficiency anemia secondary to blood loss (chronic): Secondary | ICD-10-CM

## 2021-11-11 LAB — IRON AND TIBC
Iron: 42 ug/dL (ref 28–170)
Saturation Ratios: 13 % (ref 10.4–31.8)
TIBC: 332 ug/dL (ref 250–450)
UIBC: 290 ug/dL

## 2021-11-12 ENCOUNTER — Inpatient Hospital Stay: Payer: Medicare Other

## 2021-11-12 ENCOUNTER — Encounter: Payer: Self-pay | Admitting: Internal Medicine

## 2021-11-12 ENCOUNTER — Inpatient Hospital Stay (HOSPITAL_BASED_OUTPATIENT_CLINIC_OR_DEPARTMENT_OTHER): Payer: Medicare Other | Admitting: Internal Medicine

## 2021-11-12 ENCOUNTER — Other Ambulatory Visit: Payer: Self-pay

## 2021-11-12 DIAGNOSIS — Z87891 Personal history of nicotine dependence: Secondary | ICD-10-CM | POA: Diagnosis not present

## 2021-11-12 DIAGNOSIS — D508 Other iron deficiency anemias: Secondary | ICD-10-CM

## 2021-11-12 DIAGNOSIS — D696 Thrombocytopenia, unspecified: Secondary | ICD-10-CM

## 2021-11-12 DIAGNOSIS — Z79899 Other long term (current) drug therapy: Secondary | ICD-10-CM | POA: Diagnosis not present

## 2021-11-12 DIAGNOSIS — D649 Anemia, unspecified: Secondary | ICD-10-CM | POA: Diagnosis not present

## 2021-11-12 DIAGNOSIS — D638 Anemia in other chronic diseases classified elsewhere: Secondary | ICD-10-CM

## 2021-11-12 DIAGNOSIS — Z7982 Long term (current) use of aspirin: Secondary | ICD-10-CM | POA: Diagnosis not present

## 2021-11-12 NOTE — Assessment & Plan Note (Addendum)
#   Anemia- likely IDA vs anemia of chronic disease  [unclear etiology]- s/p IV iron; improved. Hb ~12.2; [ferritin-240s; iron sat-17%-Feb 1st 2022]; continue PO iron once a day.  Hold any IV iron.  #Mild asymptomatic leukopenia/intermittent neutropenia-?  Autoimmune process.  STABLE  #Mild intermittent thrombocytopenia-today 133 Stable. ? Autoimmune process.    # DISPOSITION:  # no infusions today # follow up in 6 months-MD/cbc/bmp/iron studies/ferritin; possible Venofer-- Dr.B

## 2021-11-12 NOTE — Telephone Encounter (Signed)
Spoke with pt. Pt. Scheduled for surgery on 03/06. She will start abx 5 days prior. Patient verbalized understanding.

## 2021-11-12 NOTE — Progress Notes (Signed)
[facility] CONSULT NOTE  Patient Care Team: Valerie Roys, DO as PCP - General (Family Medicine) Ubaldo Glassing Javier Docker, MD as Consulting Physician (Cardiology) Carloyn Manner, MD as Referring Physician (Otolaryngology) Minor, Dalbert Garnet, RN (Inactive) as [place] Management Rogue Bussing, Elisha Headland, MD as Consulting Physician (Hematology and Oncology)  CHIEF COMPLAINTS/PURPOSE OF CONSULTATION:   # Anemia of chronic disease/mild intermittent leucopenia/thromboctyopenia? Autoimmune- EGD/ [place] [Dr.Wohl; 2017] chronic back pain [? 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     HISTORY OF PRESENTING ILLNESS: Patient is alone.  Ambulating independently.  Kristina Henson 66 y.o.  female mild to moderate anemia/intermittent thrombocytopenia/leukopenia of Unclear etiology- is here for follow-up.  Patient denies any further episodes of pneumonia or infections.  No blood in stools or black or stools.  She continues to be on p.o. iron once a day.   Review of Systems  Constitutional:  Positive for malaise/fatigue. Negative for chills, diaphoresis, fever and weight loss.  HENT:  Negative for nosebleeds and sore throat.   Eyes:  Negative for double vision.  Respiratory:  Negative for cough, hemoptysis, sputum production, shortness of breath and wheezing.   Cardiovascular:  Negative for chest pain, palpitations, orthopnea and leg swelling.  Gastrointestinal:  Negative for abdominal pain, blood in stool, constipation, diarrhea, heartburn, melena, nausea and vomiting.  Genitourinary:  Negative for dysuria, frequency and urgency.  Musculoskeletal:  Positive for joint pain. Negative for back pain.  Skin: Negative.  Negative for itching and  rash.  Neurological:  Negative for dizziness, tingling, focal weakness, weakness and headaches.  Endo/Heme/Allergies:  Does not bruise/bleed easily.  Psychiatric/Behavioral:  Negative for depression. The patient is not nervous/anxious and does not have insomnia.     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   Atrial fibrillation ([place])    a.) CHA2DS2-VASc = 5 (sex, HTN, CVA x 2, aortic plaque). b.) s/p ablation in 2008. c.) rate/rhythm maintained on oral amiodarone; not currently on daily anticoagulation.   Coronary artery disease    Duodenitis    Gastritis    HLD (hyperlipidemia)    Hypertension    IDA (iron deficiency anemia)    LBBB (left bundle branch block)    Mass of left submandibular region 04/29/2016   a.) FNA 04/29/2016 --> Bx (+) for pleomorphic adenoma; s/p resection   PAH (pulmonary artery hypertension) ([place]) 07/24/2020   a.) CT 07/24/2020 showed enlarged pulmonary artery consistent with PAH.   Pneumonia 2016   Sleep apnea    Stroke [facility]) 2012    SURGICAL HISTORY: Past Surgical History:  Procedure Laterality Date   BIOPSY SALIVARY GLAND Left 04/29/2016   Procedure: SUBMANDIBULAR GLAND BIOPSY (FNA); Location: [facility]; Surgeon: Markus Daft, MD (IR)   cardiac catherization     CARDIAC ELECTROPHYSIOLOGY STUDY AND ABLATION N/A 2008   Procedure: ATRIAL FIBRILLATION ABLATION; Location: [facility]   COLONOSCOPY WITH PROPOFOL N/A 11/18/2015   Procedure: COLONOSCOPY WITH PROPOFOL;  Surgeon: Lucilla Lame, MD;  Location: [facility] ENDOSCOPY;  Service: Endoscopy;  Laterality: N/A;   COLONOSCOPY WITH PROPOFOL N/A 12/02/2020   Procedure:  COLONOSCOPY WITH PROPOFOL;  Surgeon: Lucilla Lame, MD;  Location: [facility] ENDOSCOPY;  Service: Endoscopy;  Laterality: N/A;   CORONARY ANGIOPLASTY      ESOPHAGOGASTRODUODENOSCOPY (EGD) WITH PROPOFOL N/A 04/23/2016   Procedure: ESOPHAGOGASTRODUODENOSCOPY (EGD) WITH PROPOFOL;  Surgeon: Lucilla Lame, MD;  Location: [facility];  Service: Endoscopy;  Laterality: N/A;  small bowel bx gastric antrum bx   SUBMANDIBULAR GLAND EXCISION Left 09/08/2017   Procedure: EXCISION SUBMANDIBULAR GLAND;  Surgeon: Carloyn Manner, MD;  Location: [facility] ORS;  Service: ENT;  Laterality: Left;   TOTAL ABDOMINAL HYSTERECTOMY W/ BILATERAL SALPINGOOPHORECTOMY     Total    SOCIAL HISTORY: Social History   Socioeconomic History   Marital status: Divorced    Spouse name: Not on file   Number of children: Not on file   Years of education: 12   Highest education level: 12th grade  Occupational History   Occupation: disability   Tobacco Use   Smoking status: Former    Types: Cigarettes    Quit date: 1978    Years since quitting: 45.1   Smokeless tobacco: Never   Tobacco comments:    didnt smoke much   Vaping Use   Vaping Use: Never used  Substance and Sexual Activity   Alcohol use: Yes    Alcohol/week: 1.0 standard drink    Types: 1 Glasses of wine per week    Comment: occasionally   Drug use: No   Sexual activity: Not Currently  Other Topics Concern   Not on file  Social History Narrative   Not on file   Social Determinants of Health   Financial Resource Strain: Low Risk    Difficulty of Paying Living Expenses: Not hard at all  Food Insecurity: No Food Insecurity   Worried About Charity fundraiser in the Last Year: Never true   [place] in the Last Year: Never true  Transportation Needs: No Transportation Needs   Lack of Transportation (Medical): No   Lack of Transportation (Non-Medical): No  Physical Activity: Inactive   Days of Exercise per Week: 0 days   Minutes of Exercise per Session: 0 min  Stress: No Stress Concern Present   Feeling of Stress : Not at all  Social Connections: Moderately Isolated   Frequency of  Communication with Friends and Family: Twice a week   Frequency of Social Gatherings with Friends and Family: Once a week   Attends Religious Services: 1 to 4 times per year   Active Member of [employer] or Organizations: No   Attends Music therapist: Not on file   Marital Status: Divorced  Human resources officer Violence: Not At Risk   Fear of Current or Ex-Partner: No   Emotionally Abused: No   Physically Abused: No   Sexually Abused: No    FAMILY HISTORY: Family History  Problem Relation Age of Onset   Arthritis Mother    Cancer Mother    Hypertension Sister    Asthma Brother    Dementia Brother    Breast cancer Neg Hx    Kidney cancer Neg Hx    Bladder Cancer Neg Hx     ALLERGIES:  is allergic to amoxicillin.  MEDICATIONS:  Current Outpatient Medications  Medication Sig Dispense Refill   amiodarone (PACERONE) 200 MG tablet Take 1 tablet (200 mg total) by mouth every morning. 30 tablet 0   amLODipine (NORVASC) 2.5 MG tablet Take 1 tablet (2.5 mg total) by mouth daily. 90 tablet 1   Ascorbic  Acid (VITAMIN C) 100 MG tablet Take 100 mg by mouth daily.     aspirin EC 81 MG tablet Take 81 mg by mouth daily.      baclofen (LIORESAL) 10 MG tablet Take 1 tablet (10 mg total) by mouth at bedtime as needed for muscle spasms. 30 each 3   CRANBERRY PO Take 1 capsule by mouth daily.     ferrous sulfate 325 (65 FE) MG EC tablet Take 1 tablet (325 mg total) by mouth 3 (three) times daily with meals. (Patient taking differently: Take 325 mg by mouth daily with breakfast.) 270 tablet 1   hydrALAZINE (APRESOLINE) 50 MG tablet Take 1 tablet (50 mg total) by mouth 2 (two) times daily. 180 tablet 1   lisinopril-hydrochlorothiazide (ZESTORETIC) 20-25 MG tablet Take 1 tablet by mouth 2 (two) times daily. 180 tablet 1   traZODone (DESYREL) 50 MG tablet Take 1-2 tablets (50-100 mg total) by mouth at bedtime as needed for sleep. 60 tablet 2   No current facility-administered medications for  this visit.      Marland Kitchen  PHYSICAL EXAMINATION: ECOG PERFORMANCE STATUS: 1 - Symptomatic but completely ambulatory  Vitals:   11/12/21 1116  BP: (!) 146/94  Pulse: 89  Temp: (!) 97.4 F (36.3 C)  SpO2: 98%   Filed Weights   11/12/21 1116  Weight: 174 lb 3.2 oz (79 kg)    Physical Exam HENT:     Head: Normocephalic and atraumatic.     Mouth/Throat:     Pharynx: No oropharyngeal exudate.  Eyes:     Pupils: Pupils are equal, round, and reactive to light.  Cardiovascular:     Rate and Rhythm: Normal rate and regular rhythm.  Pulmonary:     Effort: No respiratory distress.     Breath sounds: No wheezing.  Abdominal:     General: Bowel sounds are normal. There is no distension.     Palpations: Abdomen is soft. There is no mass.     Tenderness: There is no abdominal tenderness. There is no guarding or rebound.  Musculoskeletal:        General: No tenderness. Normal range of motion.     Cervical back: Normal range of motion and neck supple.  Skin:    General: Skin is warm.  Neurological:     Mental Status: She is alert and oriented to person, place, and time.  Psychiatric:        Mood and Affect: Affect normal.     LABORATORY DATA:  I have reviewed the data as listed Lab Results  Component Value Date   WBC 2.6 (L) 11/10/2021   HGB 12.0 11/10/2021   HCT 37.3 11/10/2021   MCV 87.8 11/10/2021   PLT 133 (L) 11/10/2021   Recent Labs    02/12/21 0925 05/08/21 1333 06/22/21 1135 08/07/21 1116 11/10/21 1243  NA 139 139  --  138 137  K 3.7 3.4*  --  4.0 3.7  CL 101 103  --  100 102  CO2 24 26  --  25 29  GLUCOSE 93 111*  --  97 117*  BUN 24 28*  --  23 26*  CREATININE 0.78 0.96 1.20* 0.73 0.86  CALCIUM 9.5 9.3  --  9.6 9.3  GFRNONAA  --  >60  --   --  >60  PROT 7.1  --   --  7.2  --   ALBUMIN 4.0  --   --  4.2  --   AST 20  --   --  27  --   ALT 26  --   --  30  --   ALKPHOS 92  --   --  94  --   BILITOT 0.5  --   --  0.7  --     RADIOGRAPHIC STUDIES: I  have personally reviewed the radiological images as listed and agreed with the findings in the report. SLEEP STUDY DOCUMENTS  Result Date: 11/05/2021 Ordered by an unspecified provider.    ASSESSMENT & PLAN:   Anemia due to chronic illness # Anemia- likely IDA vs anemia of chronic disease  [unclear etiology]- s/p IV iron; improved. Hb ~12.2; [ferritin-240s; iron sat-17%-Feb 1st 2022]; continue PO iron once a day.  Hold any IV iron.  #Mild asymptomatic leukopenia/intermittent neutropenia-?  Autoimmune process.  STABLE  #Mild intermittent thrombocytopenia-today 133 Stable. ? Autoimmune process.    # DISPOSITION:  # no infusions today # follow up in 6 months-MD/cbc/bmp/iron studies/ferritin; possible Venofer-- Dr.B   All questions were answered. The patient knows to call the clinic with any problems, questions or concerns.     Cammie Sickle, MD 11/12/2021 7:00 PM

## 2021-11-12 NOTE — Telephone Encounter (Signed)
Patient returned call, please call back  

## 2021-11-13 ENCOUNTER — Telehealth: Payer: Self-pay

## 2021-11-13 ENCOUNTER — Other Ambulatory Visit: Payer: Self-pay

## 2021-11-13 DIAGNOSIS — N329 Bladder disorder, unspecified: Secondary | ICD-10-CM

## 2021-11-13 MED ORDER — CEPHALEXIN 500 MG PO CAPS: 500.0000 mg | ORAL_CAPSULE | Freq: Three times a day (TID) | ORAL | 0 refills | Status: DC

## 2021-11-13 NOTE — Telephone Encounter (Signed)
I spoke with Thayer Headings. We have discussed possible surgery dates and Monday March 6th, 2023 was agreed upon by all parties. Patient given information about surgery date, what to expect pre-operatively and post operatively.   We discussed that a Pre-Admission Testing office will be calling to set up the pre-op visit that will take place prior to surgery, and that these appointments are typically done over the phone with a Pre-Admissions RN.   Informed patient that our office will communicate any additional care to be provided after surgery. Patients questions or concerns were discussed during our call. Advised to call our office should there be any additional information, questions or concerns that arise. Patient verbalized understanding.

## 2021-11-13 NOTE — Progress Notes (Signed)
[facility] Posting Form   Surgery Date/Time: Date: 11/30/2021  Surgeon: Dr. Hollice Espy, MD  Surgery Location: Day Surgery  Inpt ( No  )   Outpt (Yes)   Obs ( No  )   Diagnosis: Bladder Lesion N32.9  -CPT: 16109  Surgery: Cystoscopy with Bladder Biopsy  Stop Anticoagulations: Yes, may continue ASA  Cardiac/Medical/Pulmonary Clearance needed: no  *Orders entered into EPIC  Date: 11/13/21   *Case booked in EPIC  Date: 11/12/2021  *Notified pt of Surgery: Date: 11/12/2021  PRE-OP UA & CX: no  *Placed into Prior Authorization Work Que Date: 11/13/21   Assistant/laser/rep:No

## 2021-11-16 ENCOUNTER — Other Ambulatory Visit: Payer: Self-pay

## 2021-11-16 ENCOUNTER — Ambulatory Visit
Admission: RE | Admit: 2021-11-16 | Discharge: 2021-11-16 | Disposition: A | Discharge status: Home / Self Care | Payer: Medicare Other | Source: Ambulatory Visit | Attending: Family Medicine | Admitting: Family Medicine

## 2021-11-16 DIAGNOSIS — Z1231 Encounter for screening mammogram for malignant neoplasm of breast: Secondary | ICD-10-CM | POA: Diagnosis not present

## 2021-11-17 ENCOUNTER — Other Ambulatory Visit: Payer: Self-pay

## 2021-11-17 DIAGNOSIS — G4733 Obstructive sleep apnea (adult) (pediatric): Secondary | ICD-10-CM

## 2021-11-18 LAB — BASIC METABOLIC PANEL

## 2021-11-24 ENCOUNTER — Telehealth: Payer: Self-pay | Admitting: Family Medicine

## 2021-11-24 ENCOUNTER — Ambulatory Visit (INDEPENDENT_AMBULATORY_CARE_PROVIDER_SITE_OTHER): Payer: Medicare Other | Admitting: Family Medicine

## 2021-11-24 ENCOUNTER — Other Ambulatory Visit: Payer: Self-pay

## 2021-11-24 ENCOUNTER — Encounter: Payer: Self-pay | Admitting: Family Medicine

## 2021-11-24 VITALS — BP 114/73 | HR 87 | Temp 97.8°F | Ht 62.01 in | Wt 170.8 lb

## 2021-11-24 DIAGNOSIS — J020 Streptococcal pharyngitis: Secondary | ICD-10-CM | POA: Diagnosis not present

## 2021-11-24 DIAGNOSIS — J069 Acute upper respiratory infection, unspecified: Secondary | ICD-10-CM

## 2021-11-24 LAB — RAPID STREP SCREEN (MED CTR MEBANE ONLY): Strep Gp A Ag, IA W/Reflex: POSITIVE — AB

## 2021-11-24 LAB — VERITOR FLU A/B WAIVED
Influenza A: NEGATIVE
Influenza B: NEGATIVE

## 2021-11-24 MED ORDER — HYDROCOD POLI-CHLORPHE POLI ER 10-8 MG/5ML PO SUER: 5.0000 mL | Freq: Two times a day (BID) | ORAL | 0 refills | Status: DC | PRN

## 2021-11-24 MED ORDER — CEPHALEXIN 500 MG PO CAPS
500.0000 mg | ORAL_CAPSULE | Freq: Three times a day (TID) | ORAL | 0 refills | Status: AC
Start: 2021-11-25 — End: 2021-12-05

## 2021-11-24 MED ORDER — BENZONATATE 200 MG PO CAPS: 200.0000 mg | ORAL_CAPSULE | Freq: Two times a day (BID) | ORAL | 0 refills | Status: DC | PRN

## 2021-11-24 NOTE — Progress Notes (Signed)
BP 114/73    Pulse 87    Temp 97.8 F (36.6 C) (Oral)    Ht 5' 2.01" (1.575 m)    Wt 170 lb 12.8 oz (77.5 kg)    SpO2 98%    BMI 31.23 kg/m    Subjective:    Patient ID: Kristina Henson, female    DOB: 1956/04/23, 66 y.o.   MRN: [id number]  HPI: Kristina Henson is a 66 y.o. female  Chief Complaint  Patient presents with   Nasal Congestion   Cough   UPPER RESPIRATORY TRACT INFECTION Duration: Almost a week Worst symptom: cough, congestion, sore throat Fever: no Cough: yes Shortness of breath: no Wheezing: no Chest pain: no Chest tightness: no Chest congestion: no Nasal congestion: yes Runny nose: no Post nasal drip: yes Sneezing: yes Sore throat: yes Swollen glands: no Sinus pressure: no Headache: no Face pain: no Toothache: no Ear pain: no  Ear pressure: no  Eyes red/itching:no Eye drainage/crusting: no  Vomiting: no Rash: no Fatigue: yes Sick contacts: no Strep contacts: no  Context: stable Recurrent sinusitis: no Relief with OTC cold/cough medications: no  Treatments attempted: none   Relevant past medical, surgical, family and social history reviewed and updated as indicated. Interim medical history since our last visit reviewed. Allergies and medications reviewed and updated.  Review of Systems  Constitutional:  Positive for fatigue. Negative for activity change, appetite change, chills, diaphoresis, fever and unexpected weight change.  HENT:  Positive for congestion, postnasal drip, sneezing and sore throat. Negative for dental problem, drooling, ear discharge, ear pain, facial swelling, hearing loss, mouth sores, nosebleeds, rhinorrhea, sinus pressure, sinus pain, tinnitus, trouble swallowing and voice change.   Eyes: Negative.   Respiratory: Negative.    Cardiovascular: Negative.   Gastrointestinal: Negative.   Psychiatric/Behavioral: Negative.     Per HPI unless specifically indicated above     Objective:    BP 114/73    Pulse 87    Temp  97.8 F (36.6 C) (Oral)    Ht 5' 2.01" (1.575 m)    Wt 170 lb 12.8 oz (77.5 kg)    SpO2 98%    BMI 31.23 kg/m   Wt Readings from Last 3 Encounters:  11/24/21 170 lb 12.8 oz (77.5 kg)  11/12/21 174 lb 3.2 oz (79 kg)  11/02/21 165 lb (74.8 kg)    Physical Exam Vitals and nursing note reviewed.  Constitutional:      General: She is not in acute distress.    Appearance: Normal appearance. She is obese. She is not ill-appearing, toxic-appearing or diaphoretic.  HENT:     Head: Normocephalic and atraumatic.     Right Ear: Tympanic membrane, ear canal and external ear normal.     Left Ear: Tympanic membrane, ear canal and external ear normal.     Nose: Nose normal. No congestion or rhinorrhea.     Mouth/Throat:     Mouth: Mucous membranes are moist.     Pharynx: Oropharynx is clear. No oropharyngeal exudate or posterior oropharyngeal erythema.  Eyes:     General: No scleral icterus.       Right eye: No discharge.        Left eye: No discharge.     Extraocular Movements: Extraocular movements intact.     Conjunctiva/sclera: Conjunctivae normal.     Pupils: Pupils are equal, round, and reactive to light.  Cardiovascular:     Rate and Rhythm: Normal rate and regular rhythm.  Pulses: Normal pulses.     Heart sounds: Normal heart sounds. No murmur heard.   No friction rub. No gallop.  Pulmonary:     Effort: Pulmonary effort is normal. No respiratory distress.     Breath sounds: Normal breath sounds. No stridor. No wheezing, rhonchi or rales.  Chest:     Chest wall: No tenderness.  Musculoskeletal:        General: Normal range of motion.     Cervical back: Normal range of motion and neck supple.  Lymphadenopathy:     Cervical: Cervical adenopathy present.  Skin:    General: Skin is warm and dry.     Capillary Refill: Capillary refill takes less than 2 seconds.     Coloration: Skin is not jaundiced or pale.     Findings: No bruising, erythema, lesion or rash.  Neurological:      General: No focal deficit present.     Mental Status: She is alert and oriented to person, place, and time. Mental status is at baseline.  Psychiatric:        Mood and Affect: Mood normal.        Behavior: Behavior normal.        Thought Content: Thought content normal.        Judgment: Judgment normal.    Results for orders placed or performed in visit on 11/11/21  Iron and TIBC([facility])  Result Value Ref Range   Iron 42 28 - 170 ug/dL   TIBC 332 250 - 450 ug/dL   Saturation Ratios 13 10.4 - 31.8 %   UIBC 290 ug/dL      Assessment & Plan:   Problem List Items Addressed This Visit   None Visit Diagnoses     Strep pharyngitis    -  Primary   To start keflex tomorrow prior to cysto Monday- will extend to 10 days to treat strep due to amox allergy. Call if not getting better or getting worse.    Relevant Medications   cephALEXin (KEFLEX) 500 MG capsule (Start on 11/25/2021)   Upper respiratory tract infection, unspecified type       + strep   Relevant Medications   cephALEXin (KEFLEX) 500 MG capsule (Start on 11/25/2021)   Other Relevant Orders   Veritor Flu A/B Waived   Rapid Strep Screen ([facility] ONLY)   Novel Coronavirus, NAA ([facility])        Follow up plan: Return As scheduled.

## 2021-11-24 NOTE — Telephone Encounter (Signed)
Pharmacy is requesting RX to be sent as 70 mL

## 2021-11-24 NOTE — Telephone Encounter (Signed)
chlorpheniramine-HYDROcodone (TUSSIONEX PENNKINETIC ER) 10-8 MG/5ML 50 mL 0 11/24/2021    Sig - Route: Take 5 mLs by mouth every 12 (twelve) hours as needed. - Oral   Sent to pharmacy as: chlorpheniramine-HYDROcodone [facility]) 10-8 MG/5ML   Earliest Fill Date: 11/24/2021   E-Prescribing Status: Receipt confirmed by pharmacy (11/24/2021 11:12 AM EST     Re: Special request from pharmacy Pharmacy is calling asking Dr Lenna Sciara to resend this, they are only able to get boxes of this now, 70 mls and they cannot break a box. Call back if questions ...   [facility] [address] ([address]), [place] [address]  [place]) [place] [address]  Phone: [phone] Fax: [phone]

## 2021-11-25 LAB — NOVEL CORONAVIRUS, NAA: SARS-CoV-2, NAA: NOT DETECTED

## 2021-11-27 ENCOUNTER — Other Ambulatory Visit
Admission: RE | Admit: 2021-11-27 | Discharge: 2021-11-27 | Disposition: A | Discharge status: Home / Self Care | Payer: Medicare Other | Source: Ambulatory Visit | Attending: Urology | Admitting: Urology

## 2021-11-27 ENCOUNTER — Other Ambulatory Visit: Payer: Self-pay

## 2021-11-27 NOTE — Patient Instructions (Addendum)
Your procedure is scheduled on: 11/30/21 - Monday ?Report to the Registration Desk on the 1st floor of the [facility]. ?To find out your arrival time, please call [phone] between 1PM - 3PM on: 11/27/21 - Friday  ? ?REMEMBER: ?Instructions that are not followed completely may result in serious medical risk, up to and including death; or upon the discretion of your surgeon and anesthesiologist your surgery may need to be rescheduled. ? ?Do not eat food  or drink any fluids after midnight the night before surgery.  ?No gum chewing, lozengers or hard candies. ? ?TAKE THESE MEDICATIONS THE MORNING OF SURGERY WITH A SIP OF WATER: NONE ? ?One week prior to surgery: ?Stop Anti-inflammatories (NSAIDS) such as Advil, Aleve, Ibuprofen, Motrin, Naproxen, Naprosyn and Aspirin based products such as Excedrin, Goodys Powder, BC Powder. ? ?Stop ANY OVER THE COUNTER supplements until after surgery. ? ?You may however, continue to take Tylenol if needed for pain up until the day of surgery. ? ?No Alcohol for 24 hours before or after surgery. ? ?No Smoking including e-cigarettes for 24 hours prior to surgery.  ?No chewable tobacco products for at least 6 hours prior to surgery.  ?No nicotine patches on the day of surgery. ? ?Do not use any "recreational" drugs for at least a week prior to your surgery.  ?Please be advised that the combination of cocaine and anesthesia may have negative outcomes, up to and including death. ?If you test positive for cocaine, your surgery will be cancelled. ? ?On the morning of surgery brush your teeth with toothpaste and water, you may rinse your mouth with mouthwash if you wish. ?Do not swallow any toothpaste or mouthwash. ? ?Do not wear jewelry, make-up, hairpins, clips or nail polish. ? ?Do not wear lotions, powders, or perfumes.  ? ?Do not shave body from the neck down 48 hours prior to surgery just in case you cut yourself which could leave a site for infection.  ?Also, freshly shaved skin  may become irritated if using the CHG soap. ? ?Contact lenses, hearing aids and dentures may not be worn into surgery. ? ?Do not bring valuables to the hospital. [facility] is not responsible for any missing/lost belongings or valuables.  ? ?Bring your C-PAP to the hospital with you in case you may have to spend the night.  ? ?Notify your doctor if there is any change in your medical condition (cold, fever, infection). ? ?Wear comfortable clothing (specific to your surgery type) to the hospital. ? ?After surgery, you can help prevent lung complications by doing breathing exercises.  ?Take deep breaths and cough every 1-2 hours. Your doctor may order a device called an Incentive Spirometer to help you take deep breaths. ?When coughing or sneezing, hold a pillow firmly against your incision with both hands. This is called ?splinting.? Doing this helps protect your incision. It also decreases belly discomfort. ? ?If you are being admitted to the hospital overnight, leave your suitcase in the car. ?After surgery it may be brought to your room. ? ?If you are being discharged the day of surgery, you will not be allowed to drive home. ?You will need a responsible adult (18 years or older) to drive you home and stay with you that night.  ? ?If you are taking public transportation, you will need to have a responsible adult (18 years or older) with you. ?Please confirm with your physician that it is acceptable to use public transportation.  ? ?Please call the  Pre-admissions Testing Dept. at [phone] if you have any questions about these instructions. ? ?Surgery Visitation Policy: ? ?Patients undergoing a surgery or procedure may have one family member or support person with them as long as that person is not COVID-19 positive or experiencing its symptoms.  ?That person may remain in the waiting area during the procedure and may rotate out with other people. ? ?Inpatient Visitation:   ? ?Visiting hours are 7 a.m. to 8  p.m. ?Up to two visitors ages 16+ are allowed at one time in a patient room. The visitors may rotate out with other people during the day. Visitors must check out when they leave, or other visitors will not be allowed. One designated support person may remain overnight. ?The visitor must pass COVID-19 screenings, use hand sanitizer when entering and exiting the patient?s room and wear a mask at all times, including in the patient?s room. ?Patients must also wear a mask when staff or their visitor are in the room. ?Masking is required regardless of vaccination status.  ?

## 2021-11-30 ENCOUNTER — Ambulatory Visit: Payer: Medicare Other | Admitting: Certified Registered"

## 2021-11-30 ENCOUNTER — Encounter
Admission: RE | Disposition: A | Discharge status: Home / Self Care | Payer: Self-pay | Source: Home / Self Care | Attending: Urology

## 2021-11-30 ENCOUNTER — Other Ambulatory Visit: Payer: Self-pay

## 2021-11-30 ENCOUNTER — Encounter: Payer: Self-pay | Admitting: Urology

## 2021-11-30 ENCOUNTER — Ambulatory Visit
Admission: RE | Admit: 2021-11-30 | Discharge: 2021-11-30 | Disposition: A | Discharge status: Home / Self Care | Payer: Medicare Other | Attending: Urology | Admitting: Urology

## 2021-11-30 DIAGNOSIS — E785 Hyperlipidemia, unspecified: Secondary | ICD-10-CM | POA: Diagnosis not present

## 2021-11-30 DIAGNOSIS — N952 Postmenopausal atrophic vaginitis: Secondary | ICD-10-CM | POA: Diagnosis not present

## 2021-11-30 DIAGNOSIS — Z8673 Personal history of transient ischemic attack (TIA), and cerebral infarction without residual deficits: Secondary | ICD-10-CM | POA: Insufficient documentation

## 2021-11-30 DIAGNOSIS — I4891 Unspecified atrial fibrillation: Secondary | ICD-10-CM | POA: Insufficient documentation

## 2021-11-30 DIAGNOSIS — N3031 Trigonitis with hematuria: Secondary | ICD-10-CM | POA: Diagnosis not present

## 2021-11-30 DIAGNOSIS — N3081 Other cystitis with hematuria: Secondary | ICD-10-CM | POA: Insufficient documentation

## 2021-11-30 DIAGNOSIS — Z79899 Other long term (current) drug therapy: Secondary | ICD-10-CM | POA: Diagnosis not present

## 2021-11-30 DIAGNOSIS — N329 Bladder disorder, unspecified: Secondary | ICD-10-CM | POA: Diagnosis not present

## 2021-11-30 DIAGNOSIS — N3080 Other cystitis without hematuria: Secondary | ICD-10-CM

## 2021-11-30 DIAGNOSIS — G4730 Sleep apnea, unspecified: Secondary | ICD-10-CM | POA: Insufficient documentation

## 2021-11-30 DIAGNOSIS — Z87440 Personal history of urinary (tract) infections: Secondary | ICD-10-CM | POA: Diagnosis not present

## 2021-11-30 DIAGNOSIS — N3021 Other chronic cystitis with hematuria: Secondary | ICD-10-CM | POA: Diagnosis not present

## 2021-11-30 DIAGNOSIS — I2510 Atherosclerotic heart disease of native coronary artery without angina pectoris: Secondary | ICD-10-CM | POA: Diagnosis not present

## 2021-11-30 DIAGNOSIS — I10 Essential (primary) hypertension: Secondary | ICD-10-CM | POA: Diagnosis not present

## 2021-11-30 DIAGNOSIS — N3020 Other chronic cystitis without hematuria: Secondary | ICD-10-CM

## 2021-11-30 HISTORY — PX: CYSTOSCOPY WITH BIOPSY: SHX5122

## 2021-11-30 SURGERY — CYSTOSCOPY, WITH BIOPSY
Anesthesia: Monitor Anesthesia Care

## 2021-11-30 MED ORDER — CHLORHEXIDINE GLUCONATE 0.12 % MT SOLN
OROMUCOSAL | Status: AC
  Filled 2021-11-30: qty 15

## 2021-11-30 MED ORDER — HYDROCODONE-ACETAMINOPHEN 5-325 MG PO TABS: 1.0000 | ORAL_TABLET | Freq: Four times a day (QID) | ORAL | 0 refills | Status: DC | PRN

## 2021-11-30 MED ORDER — FENTANYL CITRATE (PF) 100 MCG/2ML IJ SOLN: 25.0000 ug | INTRAMUSCULAR | Status: DC | PRN

## 2021-11-30 MED ORDER — PROPOFOL 500 MG/50ML IV EMUL
INTRAVENOUS | Status: AC
  Filled 2021-11-30: qty 50

## 2021-11-30 MED ORDER — PROPOFOL 10 MG/ML IV BOLUS
INTRAVENOUS | Status: DC | PRN
  Administered 2021-11-30: 40 mg via INTRAVENOUS

## 2021-11-30 MED ORDER — ONDANSETRON HCL 4 MG/2ML IJ SOLN
INTRAMUSCULAR | Status: DC | PRN
  Administered 2021-11-30: 4 mg via INTRAVENOUS

## 2021-11-30 MED ORDER — CHLORHEXIDINE GLUCONATE 0.12 % MT SOLN
15.0000 mL | Freq: Once | OROMUCOSAL | Status: AC
  Administered 2021-11-30: 15 mL via OROMUCOSAL

## 2021-11-30 MED ORDER — PROPOFOL 500 MG/50ML IV EMUL
INTRAVENOUS | Status: DC | PRN
  Administered 2021-11-30: 150 ug/kg/min via INTRAVENOUS

## 2021-11-30 MED ORDER — FENTANYL CITRATE (PF) 100 MCG/2ML IJ SOLN
INTRAMUSCULAR | Status: DC | PRN
  Administered 2021-11-30 (×2): 50 ug via INTRAVENOUS

## 2021-11-30 MED ORDER — CEFAZOLIN SODIUM-DEXTROSE 2-4 GM/100ML-% IV SOLN
INTRAVENOUS | Status: AC
  Filled 2021-11-30: qty 100

## 2021-11-30 MED ORDER — ORAL CARE MOUTH RINSE: 15.0000 mL | Freq: Once | OROMUCOSAL | Status: AC

## 2021-11-30 MED ORDER — LIDOCAINE HCL (CARDIAC) PF 100 MG/5ML IV SOSY
PREFILLED_SYRINGE | INTRAVENOUS | Status: DC | PRN
  Administered 2021-11-30: 50 mg via INTRAVENOUS

## 2021-11-30 MED ORDER — FAMOTIDINE 20 MG PO TABS
20.0000 mg | ORAL_TABLET | Freq: Once | ORAL | Status: AC
  Administered 2021-11-30: 20 mg via ORAL

## 2021-11-30 MED ORDER — CEFAZOLIN SODIUM-DEXTROSE 2-4 GM/100ML-% IV SOLN
2.0000 g | INTRAVENOUS | Status: AC
  Administered 2021-11-30: 2 g via INTRAVENOUS

## 2021-11-30 MED ORDER — MIDAZOLAM HCL 2 MG/2ML IJ SOLN
INTRAMUSCULAR | Status: DC | PRN
  Administered 2021-11-30: 2 mg via INTRAVENOUS

## 2021-11-30 MED ORDER — ONDANSETRON HCL 4 MG/2ML IJ SOLN: 4.0000 mg | Freq: Once | INTRAMUSCULAR | Status: DC | PRN

## 2021-11-30 MED ORDER — MIDAZOLAM HCL 2 MG/2ML IJ SOLN
INTRAMUSCULAR | Status: AC
  Filled 2021-11-30: qty 2

## 2021-11-30 MED ORDER — FENTANYL CITRATE (PF) 100 MCG/2ML IJ SOLN
INTRAMUSCULAR | Status: AC
  Filled 2021-11-30: qty 2

## 2021-11-30 MED ORDER — FAMOTIDINE 20 MG PO TABS
ORAL_TABLET | ORAL | Status: AC
  Filled 2021-11-30: qty 1

## 2021-11-30 MED ORDER — LACTATED RINGERS IV SOLN: INTRAVENOUS | Status: DC

## 2021-11-30 SURGICAL SUPPLY — 19 items
BAG DRAIN CYSTO-URO LG1000N (MISCELLANEOUS) ×2 IMPLANT
BRUSH SCRUB EZ  4% CHG (MISCELLANEOUS) ×1
BRUSH SCRUB EZ 4% CHG (MISCELLANEOUS) ×1 IMPLANT
DRSG TELFA 4X3 1S NADH ST (GAUZE/BANDAGES/DRESSINGS) ×2 IMPLANT
ELECT REM PT RETURN 9FT ADLT (ELECTROSURGICAL) ×2
ELECTRODE REM PT RTRN 9FT ADLT (ELECTROSURGICAL) ×1 IMPLANT
GAUZE 4X4 16PLY ~~LOC~~+RFID DBL (SPONGE) ×4 IMPLANT
GLOVE SURG ENC MOIS LTX SZ6.5 (GLOVE) ×2 IMPLANT
GOWN STRL REUS W/ TWL LRG LVL3 (GOWN DISPOSABLE) ×2 IMPLANT
GOWN STRL REUS W/TWL LRG LVL3 (GOWN DISPOSABLE) ×2
KIT TURNOVER CYSTO (KITS) ×2 IMPLANT
NDL SAFETY ECLIPSE 18X1.5 (NEEDLE) ×1 IMPLANT
NEEDLE HYPO 18GX1.5 SHARP (NEEDLE) ×1
PACK CYSTO AR (MISCELLANEOUS) ×2 IMPLANT
SET CYSTO W/LG BORE CLAMP LF (SET/KITS/TRAYS/PACK) ×2 IMPLANT
SURGILUBE 2OZ TUBE FLIPTOP (MISCELLANEOUS) ×2 IMPLANT
WATER STERILE IRR 1000ML POUR (IV SOLUTION) ×2 IMPLANT
WATER STERILE IRR 3000ML UROMA (IV SOLUTION) ×2 IMPLANT
WATER STERILE IRR 500ML POUR (IV SOLUTION) ×2 IMPLANT

## 2021-11-30 NOTE — Op Note (Signed)
Date of procedure: 11/30/21 ? ?Preoperative diagnosis:  ?Bladder lesion ? ?Postoperative diagnosis:  ?Bladder lesion ?Chronic cystitis ? ?Procedure: ?Cystoscopy ?Bladder biopsy ? ?Surgeon: Hollice Espy, MD ? ?Anesthesia: Monitored anesthesia care ? ?Complications: None ? ?Intraoperative findings: Prominent cobblestoning along the entirety of the trigone ? ?EBL: Minimal ? ?Specimens: Bladder biopsy, trigone ? ?Drains: None ? ?Indication: Kristina Henson is a 66 y.o. patient with suspicious prominent findings along the trigone at the time of cystoscopy.  After reviewing the management options for treatment, she elected to proceed with the above surgical procedure(s). We have discussed the potential benefits and risks of the procedure, side effects of the proposed treatment, the likelihood of the patient achieving the goals of the procedure, and any potential problems that might occur during the procedure or recuperation. Informed consent has been obtained. ? ?Description of procedure: ? ?The patient was taken to the operating room and general anesthesia was induced.  The patient was placed in the dorsal lithotomy position, prepped and draped in the usual sterile fashion, and preoperative antibiotics were administered. A preoperative time-out was performed.  ? ?29 [place] scope was advanced per urethra into the bladder.  Inspecting the bladder revealed fairly unremarkable findings however there was very extensive more than anticipated cobblestoning and inflammation as well as what appeared to be cystitis cystica lesions along the entirety of the trigone.  The symptoms consistent with trigonitis/chronic cystitis however underlying malignancy cannot be ruled out.  There is clear E flux from both UOs.  The remainder of the bladder was spared. ? ?Cold cup biopsy forceps were used to biopsy several areas along the trigone.  Bugbee electrocautery was used to fulgurate each of these areas until excellent hemostasis was  achieved.  Both of the UOs were identified.  The bladder was then drained. ? ?The patient was then cleaned and dried, repositioned supine position, reversed from sedation, taken to PACU in stable condition. ? ?Plan: I will plan on calling her with her pathology results.  Anticipate this will be benign. ? ?Hollice Espy, M.D. ? ?

## 2021-11-30 NOTE — Transfer of Care (Signed)
Immediate Anesthesia Transfer of Care Note ? ?Patient: Kristina Henson ? ?Procedure(s) Performed: CYSTOSCOPY WITH BLADDER BIOPSY ? ?Patient Location: PACU ? ?Anesthesia Type:General ? ?Level of Consciousness: awake ? ?Airway & Oxygen Therapy: Patient Spontanous Breathing and Patient connected to face mask oxygen ? ?Post-op Assessment: Report given to RN and Post -op Vital signs reviewed and stable ? ?Post vital signs: Reviewed and stable ? ?Last Vitals:  ?Vitals Value Taken Time  ?BP 95/71 11/30/21 1245  ?Temp    ?Pulse 76 11/30/21 1245  ?Resp    ?SpO2 98 % 11/30/21 1245  ?Vitals shown include unvalidated device data. ? ?Last Pain:  ?Vitals:  ? 11/30/21 0950  ?TempSrc: Temporal  ?PainSc: 0-No pain  ?   ? ?  ? ?Complications: No notable events documented. ?

## 2021-11-30 NOTE — H&P (Signed)
H&P up to date ?Currently on abx ?RRR ?CTAB ? ? ?CC:  ?   ?Chief Complaint  ?Patient presents with  ? Cysto  ?  ?  ?  ?HPI: ?Kristina Henson is a 66 y.o.female with a personal history of gross hematuria, recurrent UTIs, and vaginal atrophy, who presents today for a cystoscopy.  ?  ?She has two documented UTIs this year one on 11/24/2020 which grew Klebsiella pneumoniae resistant to ampicillin and another on 01/16/2021 which grew Klebsiella pneumoniae resistant to ampicillin  ?  ?She underwent a CT urogram on 06/22/2021 that was negative.  ?  ?Previous cystoscopy on 06/24/2021 revealed mild edema at bladder neck and inflammation along trigone which was a little more pronounced than normal. ?  ?Urine today had 11-30 wbcs, 6-10 RBCs, and many bacteria.  ?  ?   ?Vitals:  ?  08/25/21 1112  ?BP: (!) 163/100  ?Pulse: (!) 106  ?  ?NED. A&Ox3.   ?No respiratory distress   ?Abd soft, NT, ND ?Normal external genitalia with patent urethral meatus ?  ?Cystoscopy Procedure Note ?  ?Patient identification was confirmed, informed consent was obtained, and patient was prepped using Betadine solution.  Lidocaine jelly was administered per urethral meatus.   ?  ?Procedure: ?- Flexible cystoscope introduced, without any difficulty.   ?- Thorough search of the bladder revealed: ?   normal urethral meatus ?   normal urothelium ?    Moderate edema at bladder neck and inflammation along trigone which was a little more pronounced than normal ?   no stones ?   no ulcers  ?   no tumors ?   no urethral polyps ?   no trabeculation ?  ?- Ureteral orifices were normal in position and appearance. ?  ?Post-Procedure: ?- Patient tolerated the procedure well ?  ?Assessment/ Plan: ?  ?Trigonitis/ bladder inflammation  ?- Unchanged since previous cystoscopy  ?- Due to degree of inflammation and irregularity that is fairly focal, recommend  bladder biopsy to rule out any pathology. We discussed risk and benefits ?-She is nervous but agreeable this plan ?-  We sent for  urine for culture  ?  ?  ?  ?I,Kailey Littlejohn,acting as a scribe for Hollice Espy, MD.,have documented all relevant documentation on the behalf of Hollice Espy, MD,as directed by  Hollice Espy, MD while in the presence of Hollice Espy, MD. ?  ?I have reviewed the above documentation for accuracy and completeness, and I agree with the above.  ?  ?Hollice Espy, MD ?  ?  ?

## 2021-11-30 NOTE — Anesthesia Postprocedure Evaluation (Signed)
Anesthesia Post Note ? ?Patient: DESIRAE MANCUSI ? ?Procedure(s) Performed: CYSTOSCOPY WITH BLADDER BIOPSY ? ?Patient location during evaluation: PACU ?Anesthesia Type: MAC and General ?Level of consciousness: awake and oriented ?Pain management: pain level controlled ?Vital Signs Assessment: post-procedure vital signs reviewed and stable ?Respiratory status: spontaneous breathing and nonlabored ventilation ?Cardiovascular status: stable ?Anesthetic complications: no ? ? ?No notable events documented. ? ? ?Last Vitals:  ?Vitals:  ? 11/30/21 1245 11/30/21 1300  ?BP: 95/71 91/79  ?Pulse: 95 80  ?Resp:    ?Temp: 36.6 ?C   ?SpO2: 98% 92%  ?  ?Last Pain:  ?Vitals:  ? 11/30/21 1300  ?TempSrc:   ?PainSc: 0-No pain  ? ? ?  ?  ?  ?  ?  ?  ? ?VAN STAVEREN,Nekeya Briski ? ? ? ? ?

## 2021-11-30 NOTE — Discharge Instructions (Addendum)

## 2021-11-30 NOTE — Anesthesia Preprocedure Evaluation (Addendum)
Anesthesia Evaluation  ?Patient identified by MRN, date of birth, ID band ?Patient awake ? ? ? ?Reviewed: ?Allergy & Precautions, NPO status , Patient's Chart, lab work & pertinent test results ? ?History of Anesthesia Complications ?Negative for: history of anesthetic complications ? ?Airway ?Mallampati: III ? ?TM Distance: >3 FB ?Neck ROM: full ? ? ? Dental ? ?(+) Partial Lower ?  ?Pulmonary ?neg pulmonary ROS, sleep apnea , former smoker,  ?  ?Pulmonary exam normal ? ?+ decreased breath sounds ? ? ? ? ? Cardiovascular ?hypertension, Pt. on medications ?+ CAD  ?Normal cardiovascular exam+ dysrhythmias Atrial Fibrillation  ?Rate:Tachycardia ? ? ?  ?Neuro/Psych ?CVA, No Residual Symptoms negative neurological ROS ? negative psych ROS  ? GI/Hepatic ?negative GI ROS, Neg liver ROS,   ?Endo/Other  ?negative endocrine ROS ? Renal/GU ?  ?negative genitourinary ?  ?Musculoskeletal ? ?(+) Arthritis ,  ? Abdominal ?  ?Peds ?negative pediatric ROS ?(+)  Hematology ?negative hematology ROS ?(+) Blood dyscrasia, anemia ,   ?Anesthesia Other Findings ?Past Medical History: ?No date: Aortic atherosclerosis ([place]) ?No date: Arthritis ?    Comment:  BACK-LUMBAR ?10/16/2020: Ascending aorta dilation ([facility]) ?    Comment:  a.) TTE 10/16/2020 --> borderline at 37 mm. ?No date: Atrial fibrillation ([place]) ?    Comment:  a.) CHA2DS2-VASc = 5 (sex, HTN, CVA x 2, aortic plaque). ?             b.) s/p ablation in 2008. c.) rate/rhythm maintained on  ?             oral amiodarone; not currently on daily anticoagulation. ?No date: Coronary artery disease ?No date: Duodenitis ?No date: Gastritis ?No date: HLD (hyperlipidemia) ?No date: Hypertension ?No date: IDA (iron deficiency anemia) ?No date: LBBB (left bundle branch block) ?04/29/2016: Mass of left submandibular region ?    Comment:  a.) FNA 04/29/2016 --> Bx (+) for pleomorphic adenoma;  ?             s/p resection ?07/24/2020: PAH (pulmonary artery  hypertension) ([place]) ?    Comment:  a.) CT 07/24/2020 showed enlarged pulmonary artery  ?             consistent with PAH. ?2016: Pneumonia ?No date: Sleep apnea ?2012: Stroke [facility]) ? ?Past Surgical History: ?04/29/2016: BIOPSY SALIVARY GLAND; Left ?    Comment:  Procedure: SUBMANDIBULAR GLAND BIOPSY (FNA); Location:  ?             [place]; Surgeon: Markus Daft, MD (IR) ?No date: cardiac catherization ?2008: CARDIAC ELECTROPHYSIOLOGY STUDY AND ABLATION; N/A ?    Comment:  Procedure: ATRIAL FIBRILLATION ABLATION; Location:  ?             [facility] ?11/18/2015: COLONOSCOPY WITH PROPOFOL; N/A ?    Comment:  Procedure: COLONOSCOPY WITH PROPOFOL;  Surgeon: Evangeline Gula  ?             Allen Norris, MD;  Location: [facility];  Service: Endoscopy; ?             Laterality: N/A; ?12/02/2020: COLONOSCOPY WITH PROPOFOL; N/A ?    Comment:  Procedure: COLONOSCOPY WITH PROPOFOL;  Surgeon: Allen Norris,  ?             Darren, MD;  Location: [facility];  Service:  ?             Endoscopy;  Laterality: N/A; ?No date: CORONARY ANGIOPLASTY ?04/23/2016: ESOPHAGOGASTRODUODENOSCOPY (EGD) WITH PROPOFOL; N/A ?  Comment:  Procedure: ESOPHAGOGASTRODUODENOSCOPY (EGD) WITH  ?             PROPOFOL;  Surgeon: Lucilla Lame, MD;  Location: [facility]  ?             [facility];  Service: Endoscopy;  Laterality: N/A;   ?             small bowel bx ?gastric antrum bx ?09/08/2017: SUBMANDIBULAR GLAND EXCISION; Left ?    Comment:  Procedure: EXCISION SUBMANDIBULAR GLAND;  Surgeon:  ?             Carloyn Manner, MD;  Location: [facility];  Service:  ?             ENT;  Laterality: Left; ?No date: TOTAL ABDOMINAL HYSTERECTOMY W/ BILATERAL SALPINGOOPHORECTOMY ?    Comment:  Total ? ?BMI   ? Body Mass Index: 31.09 kg/m?  ?  ? ? Reproductive/Obstetrics ?negative OB ROS ? ?  ? ? ? ? ? ? ? ? ? ? ? ? ? ?  ?  ? ? ? ? ? ? ? ?Anesthesia Physical ?Anesthesia Plan ? ?ASA: 3 ? ?Anesthesia Plan: MAC  ? ?Post-op Pain Management:   ? ?Induction:  ? ?PONV Risk  Score and Plan:  ? ?Airway Management Planned:  ? ?Additional Equipment:  ? ?Intra-op Plan:  ? ?Post-operative Plan:  ? ?Informed Consent: I have reviewed the patients History and Physical, chart, labs and discussed the procedure including the risks, benefits and alternatives for the proposed anesthesia with the patient or authorized representative who has indicated his/her understanding and acceptance.  ? ? ? ? ? ?Plan Discussed with: Anesthesiologist, CRNA and Surgeon ? ?Anesthesia Plan Comments:   ? ? ? ? ? ?Anesthesia Quick Evaluation ? ?

## 2021-12-01 ENCOUNTER — Encounter: Payer: Self-pay | Admitting: Urology

## 2021-12-01 LAB — SURGICAL PATHOLOGY

## 2021-12-03 NOTE — Progress Notes (Signed)
Scheduled patient an appointment in September for a follow up. ?

## 2022-01-28 ENCOUNTER — Ambulatory Visit: Payer: Medicare Other | Admitting: Family Medicine

## 2022-01-29 ENCOUNTER — Encounter: Payer: Self-pay | Admitting: Family Medicine

## 2022-01-29 ENCOUNTER — Ambulatory Visit (INDEPENDENT_AMBULATORY_CARE_PROVIDER_SITE_OTHER): Payer: Medicare Other | Admitting: Family Medicine

## 2022-01-29 VITALS — BP 127/83 | HR 82 | Temp 97.9°F | Wt 169.2 lb

## 2022-01-29 DIAGNOSIS — J069 Acute upper respiratory infection, unspecified: Secondary | ICD-10-CM

## 2022-01-29 MED ORDER — PREDNISONE 50 MG PO TABS: 50.0000 mg | ORAL_TABLET | Freq: Every day | ORAL | 0 refills | Status: DC

## 2022-01-29 MED ORDER — BENZONATATE 200 MG PO CAPS: 200.0000 mg | ORAL_CAPSULE | Freq: Two times a day (BID) | ORAL | 0 refills | Status: DC | PRN

## 2022-01-29 MED ORDER — HYDROCOD POLI-CHLORPHE POLI ER 10-8 MG/5ML PO SUER: 5.0000 mL | Freq: Two times a day (BID) | ORAL | 0 refills | Status: DC | PRN

## 2022-01-29 NOTE — Progress Notes (Signed)
? ?BP 127/83   Pulse 82   Temp 97.9 ?F (36.6 ?C)   Wt 169 lb 3.2 oz (76.7 kg)   SpO2 97%   BMI 30.95 kg/m?   ? ?Subjective:  ? ? Patient ID: Kristina Henson, female    DOB: [birth year], 66 y.o.   MRN: [id number] ? ?HPI: ?Kristina Henson is a 66 y.o. female ? ?Chief Complaint  ?Patient presents with  ? URI  ?  Patient states she has been coughing, congestion, runny nose for a few days. Patient states she is having coughing spells.   ? ?UPPER RESPIRATORY TRACT INFECTION ?Duration: 3-4 days ?Worst symptom: cough ?Fever: no ?Cough: yes ?Shortness of breath: no ?Wheezing: no ?Chest pain: no ?Chest tightness: no ?Chest congestion: no ?Nasal congestion: no ?Runny nose: yes ?Post nasal drip: yes ?Sneezing: no ?Sore throat: no ?Swollen glands: no ?Sinus pressure: no ?Headache: no ?Face pain: no ?Toothache: no ?Ear pain: no  ?Ear pressure: no  ?Eyes red/itching:no ?Eye drainage/crusting: no  ?Vomiting: no ?Rash: no ?Fatigue: yes ?Sick contacts: no ?Strep contacts: no  ?Context: stable ?Recurrent sinusitis: no ?Relief with OTC cold/cough medications: no  ?Treatments attempted: none  ? ? ?Relevant past medical, surgical, family and social history reviewed and updated as indicated. Interim medical history since our last visit reviewed. ?Allergies and medications reviewed and updated. ? ?Review of Systems  ?Constitutional:  Positive for fatigue. Negative for activity change, appetite change, chills, diaphoresis, fever and unexpected weight change.  ?HENT:  Positive for congestion and postnasal drip. Negative for dental problem, drooling, ear discharge, ear pain, facial swelling, hearing loss, mouth sores, nosebleeds, rhinorrhea, sinus pressure, sinus pain, sneezing, sore throat, tinnitus, trouble swallowing and voice change.   ?Eyes: Negative.   ?Respiratory:  Positive for cough. Negative for apnea, choking, chest tightness, shortness of breath, wheezing and stridor.   ?Cardiovascular: Negative.   ?Gastrointestinal: Negative.    ?Musculoskeletal: Negative.   ?Neurological: Negative.   ?Psychiatric/Behavioral: Negative.    ? ?Per HPI unless specifically indicated above ? ?   ?Objective:  ?  ?BP 127/83   Pulse 82   Temp 97.9 ?F (36.6 ?C)   Wt 169 lb 3.2 oz (76.7 kg)   SpO2 97%   BMI 30.95 kg/m?   ?Wt Readings from Last 3 Encounters:  ?01/29/22 169 lb 3.2 oz (76.7 kg)  ?11/30/21 170 lb (77.1 kg)  ?11/24/21 170 lb 12.8 oz (77.5 kg)  ?  ?Physical Exam ?Vitals and nursing note reviewed.  ?Constitutional:   ?   General: She is not in acute distress. ?   Appearance: Normal appearance. She is normal weight. She is not ill-appearing, toxic-appearing or diaphoretic.  ?HENT:  ?   Head: Normocephalic and atraumatic.  ?   Right Ear: Tympanic membrane, ear canal and external ear normal.  ?   Left Ear: Tympanic membrane, ear canal and external ear normal.  ?   Nose: Nose normal. No congestion or rhinorrhea.  ?   Mouth/Throat:  ?   Mouth: Mucous membranes are moist.  ?   Pharynx: Oropharynx is clear. No oropharyngeal exudate or posterior oropharyngeal erythema.  ?Eyes:  ?   General: No scleral icterus.    ?   Right eye: No discharge.     ?   Left eye: No discharge.  ?   Extraocular Movements: Extraocular movements intact.  ?   Conjunctiva/sclera: Conjunctivae normal.  ?   Pupils: Pupils are equal, round, and reactive to light.  ?Neck:  ?  Vascular: No carotid bruit.  ?Cardiovascular:  ?   Rate and Rhythm: Normal rate and regular rhythm.  ?   Pulses: Normal pulses.  ?   Heart sounds: Normal heart sounds. No murmur heard. ?  No friction rub. No gallop.  ?Pulmonary:  ?   Effort: Pulmonary effort is normal. No respiratory distress.  ?   Breath sounds: Normal breath sounds. No stridor. No wheezing, rhonchi or rales.  ?Chest:  ?   Chest wall: No tenderness.  ?Musculoskeletal:     ?   General: Normal range of motion.  ?   Cervical back: Normal range of motion and neck supple. No rigidity or tenderness.  ?Lymphadenopathy:  ?   Cervical: Cervical adenopathy  present.  ?Skin: ?   General: Skin is warm and dry.  ?   Capillary Refill: Capillary refill takes less than 2 seconds.  ?   Coloration: Skin is not jaundiced or pale.  ?   Findings: No bruising, erythema, lesion or rash.  ?Neurological:  ?   General: No focal deficit present.  ?   Mental Status: She is alert and oriented to person, place, and time. Mental status is at baseline.  ?Psychiatric:     ?   Mood and Affect: Mood normal.     ?   Behavior: Behavior normal.     ?   Thought Content: Thought content normal.     ?   Judgment: Judgment normal.  ? ? ?Results for orders placed or performed during the hospital encounter of 11/30/21  ?Surgical pathology  ?Result Value Ref Range  ? SURGICAL PATHOLOGY    ?  SURGICAL PATHOLOGY ?CASE: [id number] ?PATIENT: Kristina Henson ?Surgical Pathology Report ? ? ? ? ?Specimen Submitted: ?A. Bladder trigone; biopsy ? ?Clinical History: Hematuria, urinary tract infections, and ?trigonitis/bladder inflammation on office cystoscopy. ?Findings: Trigone shows cobblestoning, inflammation, and cystitis ?cystica, rule out malignancy ? ? ? ? ?DIAGNOSIS: ?A. 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 ?- CYSTITIS CYSTICA AND CYSTITIS GLANDULARIS, IN ALL SAMPLES. ?- NON-KERATINIZING SQUAMOUS METAPLASIA, MULTIPLE FOCI. ?- NEGATIVE FOR MALIGNANCY. ? ?Comment: ?Lymphoid follicles with germinal centers are present, but unlikely to ?have been visible clinically due to their small size and limited ?distribution. ?The cystitis cystica and cystitis glandularis are extensive and likely ?account for the visible mucosal changes. ?Patchy squamous metaplasia may also be contributing to the clinical ?appearance. ? ?GROSS DESCRIPTION: ?A.  Labeled: Bladder biopsy trigone ?Received: Formalin ?Collection time: 12:28 PM on 11/30/2021 ?Placed into formalin time: 12:32 PM on 11/30/2021 ?Tissue fragment(s): Multiple ?Size: Aggregate, 1.5 x 0.6 x 0.2 cm ?Description: Received on  Telfa pad are fragments of tan-pink soft ?tissue. ?Entirely submitted in 1 cassette. ? ?RB 11/30/2021 ? ? ?Final Diagnosis performed by Bryan Lemma, MD.   Electronically signed ?12/01/2021 12:17:53PM ?The electronic signature indicates that the named Attending Pathologist ?has evaluated the specimen ?Technical component performed at [facility], [address], [place], ?[place] [address] Lab: [phone] Dir: Rush Farmer, MD, MMM ? Professional component performed at [facility], [facility], [place], [place], [place] [address] Lab: [phone] ?Dir: Kathi Simpers, MD ?  ? ?   ?Assessment & Plan:  ? ?Problem List Items Addressed This Visit   ?None ?Visit Diagnoses   ? ? Upper respiratory tract infection, unspecified type    -  Primary  ? Will treat with burst of prednisone, tussionex and tessalon perles. Call if not getting better or getting worse. Continue to  monitor. Call with any concerns.   ? ?  ?  ? ?Follow up plan: ?Return As scheduled. ? ? ? ? ? ?

## 2022-02-04 ENCOUNTER — Encounter: Payer: Self-pay | Admitting: Family Medicine

## 2022-02-04 ENCOUNTER — Ambulatory Visit (INDEPENDENT_AMBULATORY_CARE_PROVIDER_SITE_OTHER): Payer: Medicare Other | Admitting: Family Medicine

## 2022-02-04 VITALS — BP 121/71 | HR 105 | Temp 98.2°F | Wt 167.6 lb

## 2022-02-04 DIAGNOSIS — J069 Acute upper respiratory infection, unspecified: Secondary | ICD-10-CM | POA: Diagnosis not present

## 2022-02-04 DIAGNOSIS — I129 Hypertensive chronic kidney disease with stage 1 through stage 4 chronic kidney disease, or unspecified chronic kidney disease: Secondary | ICD-10-CM | POA: Diagnosis not present

## 2022-02-04 DIAGNOSIS — Z87440 Personal history of urinary (tract) infections: Secondary | ICD-10-CM | POA: Diagnosis not present

## 2022-02-04 DIAGNOSIS — D500 Iron deficiency anemia secondary to blood loss (chronic): Secondary | ICD-10-CM | POA: Diagnosis not present

## 2022-02-04 DIAGNOSIS — I4891 Unspecified atrial fibrillation: Secondary | ICD-10-CM

## 2022-02-04 DIAGNOSIS — R8281 Pyuria: Secondary | ICD-10-CM | POA: Diagnosis not present

## 2022-02-04 DIAGNOSIS — E782 Mixed hyperlipidemia: Secondary | ICD-10-CM

## 2022-02-04 DIAGNOSIS — I700 Atherosclerosis of aorta: Secondary | ICD-10-CM

## 2022-02-04 LAB — MICROALBUMIN, URINE WAIVED
Creatinine, Urine Waived: 200 mg/dL (ref 10–300)
Microalb, Ur Waived: 80 mg/L — ABNORMAL HIGH (ref 0–19)

## 2022-02-04 LAB — URINALYSIS, ROUTINE W REFLEX MICROSCOPIC
Bilirubin, UA: NEGATIVE
Glucose, UA: NEGATIVE
Ketones, UA: NEGATIVE
Nitrite, UA: NEGATIVE
Specific Gravity, UA: 1.02 (ref 1.005–1.030)
Urobilinogen, Ur: 0.2 mg/dL (ref 0.2–1.0)
pH, UA: 5.5 (ref 5.0–7.5)

## 2022-02-04 LAB — MICROSCOPIC EXAMINATION: Bacteria, UA: NONE SEEN

## 2022-02-04 MED ORDER — HYDRALAZINE HCL 50 MG PO TABS: 50.0000 mg | ORAL_TABLET | Freq: Two times a day (BID) | ORAL | 1 refills | Status: DC

## 2022-02-04 MED ORDER — LISINOPRIL-HYDROCHLOROTHIAZIDE 20-25 MG PO TABS: 1.0000 | ORAL_TABLET | Freq: Two times a day (BID) | ORAL | 1 refills | Status: DC

## 2022-02-04 MED ORDER — TRAZODONE HCL 50 MG PO TABS: 50.0000 mg | ORAL_TABLET | Freq: Every evening | ORAL | 2 refills | Status: DC | PRN

## 2022-02-04 MED ORDER — AMLODIPINE BESYLATE 2.5 MG PO TABS: 2.5000 mg | ORAL_TABLET | Freq: Every day | ORAL | 1 refills | Status: DC

## 2022-02-04 MED ORDER — BENZONATATE 200 MG PO CAPS: 200.0000 mg | ORAL_CAPSULE | Freq: Two times a day (BID) | ORAL | 1 refills | Status: DC | PRN

## 2022-02-04 MED ORDER — ALBUTEROL SULFATE HFA 108 (90 BASE) MCG/ACT IN AERS: 2.0000 | INHALATION_SPRAY | Freq: Four times a day (QID) | RESPIRATORY_TRACT | 0 refills | Status: DC | PRN

## 2022-02-04 NOTE — Assessment & Plan Note (Signed)
Continue to follow with cardiology. Call with any concerns. Continue to monitor.  

## 2022-02-04 NOTE — Assessment & Plan Note (Signed)
Continue to follow with oncology. Rechecking labs today. Await results. Treat as needed.  ?

## 2022-02-04 NOTE — Assessment & Plan Note (Signed)
Will keep BP and cholesterol under good control. Checking labs today. Continue to follow with cardiology.  ?

## 2022-02-04 NOTE — Progress Notes (Signed)
? ?BP 121/71   Pulse (!) 105   Temp 98.2 ?F (36.8 ?C)   Wt 167 lb 9.6 oz (76 kg)   SpO2 100%   BMI 30.65 kg/m?   ? ?Subjective:  ? ? Patient ID: Kristina Henson, female    DOB: 04-11-1956, 66 y.o.   MRN: [id number] ? ?HPI: ?Kristina Henson is a 66 y.o. female ? ?Chief Complaint  ?Patient presents with  ? Hypertension  ? Hyperlipidemia  ? URI  ?  Patient states her cough has gotten better, but is still lingering   ? ?Cough is better, but still holding on. Still has a few days left on her prednisone. No fevers or chills ? ?HYPERTENSION / HYPERLIPIDEMIA ?Satisfied with current treatment? yes ?Duration of hypertension: chronic ?BP monitoring frequency: not checking ?BP medication side effects: no ?Past BP meds: hydralazine, lisinopril, HCTZ, amlodipine ?Duration of hyperlipidemia: chronic ?Cholesterol medication side effects: no ?Cholesterol supplements: none ?Past cholesterol medications: none ?Medication compliance: excellent compliance ?Aspirin: yes ?Recent stressors: no ?Recurrent headaches: no ?Visual changes: no ?Palpitations: no ?Dyspnea: yes ?Chest pain: no ?Lower extremity edema: no ?Dizzy/lightheaded: no ? ?ANEMIA ?Anemia status: stable ?Etiology of anemia: iron def ?Duration of anemia treatment: chronic  ?Compliance with treatment: excellent compliance ?Iron supplementation side effects: no ?Severity of anemia: moderate ?Fatigue: yes ?Decreased exercise tolerance: no  ?Dyspnea on exertion: no ?Palpitations: no ?Bleeding: no ?Pica: no ? ? ?Relevant past medical, surgical, family and social history reviewed and updated as indicated. Interim medical history since our last visit reviewed. ?Allergies and medications reviewed and updated. ? ?Review of Systems  ?Constitutional: Negative.   ?HENT: Negative.    ?Respiratory:  Positive for cough. Negative for apnea, choking, chest tightness, shortness of breath, wheezing and stridor.   ?Cardiovascular: Negative.   ?Gastrointestinal: Negative.   ?Skin: Negative.    ?Neurological: Negative.   ?Psychiatric/Behavioral: Negative.    ? ?Per HPI unless specifically indicated above ? ?   ?Objective:  ?  ?BP 121/71   Pulse (!) 105   Temp 98.2 ?F (36.8 ?C)   Wt 167 lb 9.6 oz (76 kg)   SpO2 100%   BMI 30.65 kg/m?   ?Wt Readings from Last 3 Encounters:  ?02/04/22 167 lb 9.6 oz (76 kg)  ?01/29/22 169 lb 3.2 oz (76.7 kg)  ?11/30/21 170 lb (77.1 kg)  ?  ?Physical Exam ?Vitals and nursing note reviewed.  ?Constitutional:   ?   General: She is not in acute distress. ?   Appearance: Normal appearance. She is obese. She is not ill-appearing, toxic-appearing or diaphoretic.  ?HENT:  ?   Head: Normocephalic and atraumatic.  ?   Right Ear: External ear normal.  ?   Left Ear: External ear normal.  ?   Nose: Nose normal.  ?   Mouth/Throat:  ?   Mouth: Mucous membranes are moist.  ?   Pharynx: Oropharynx is clear.  ?Eyes:  ?   General: No scleral icterus.    ?   Right eye: No discharge.     ?   Left eye: No discharge.  ?   Extraocular Movements: Extraocular movements intact.  ?   Conjunctiva/sclera: Conjunctivae normal.  ?   Pupils: Pupils are equal, round, and reactive to light.  ?Cardiovascular:  ?   Rate and Rhythm: Normal rate and regular rhythm.  ?   Pulses: Normal pulses.  ?   Heart sounds: Normal heart sounds. No murmur heard. ?  No friction rub. No gallop.  ?  Pulmonary:  ?   Effort: Pulmonary effort is normal. No respiratory distress.  ?   Breath sounds: Normal breath sounds. No stridor. No wheezing, rhonchi or rales.  ?Chest:  ?   Chest wall: No tenderness.  ?Musculoskeletal:     ?   General: Normal range of motion.  ?   Cervical back: Normal range of motion and neck supple.  ?Skin: ?   General: Skin is warm and dry.  ?   Capillary Refill: Capillary refill takes less than 2 seconds.  ?   Coloration: Skin is not jaundiced or pale.  ?   Findings: No bruising, erythema, lesion or rash.  ?Neurological:  ?   General: No focal deficit present.  ?   Mental Status: She is alert and oriented to  person, place, and time. Mental status is at baseline.  ?Psychiatric:     ?   Mood and Affect: Mood normal.     ?   Behavior: Behavior normal.     ?   Thought Content: Thought content normal.     ?   Judgment: Judgment normal.  ? ? ?Results for orders placed or performed during the hospital encounter of 11/30/21  ?Surgical pathology  ?Result Value Ref Range  ? SURGICAL PATHOLOGY    ?  SURGICAL PATHOLOGY ?CASE: [id number] ?PATIENT: Kristina Henson ?Surgical Pathology Report ? ? ? ? ?Specimen Submitted: ?A. Bladder trigone; biopsy ? ?Clinical History: Hematuria, urinary tract infections, and ?trigonitis/bladder inflammation on office cystoscopy. ?Findings: Trigone shows cobblestoning, inflammation, and cystitis ?cystica, rule out malignancy ? ? ? ? ?DIAGNOSIS: ?A. 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 ?- CYSTITIS CYSTICA AND CYSTITIS GLANDULARIS, IN ALL SAMPLES. ?- NON-KERATINIZING SQUAMOUS METAPLASIA, MULTIPLE FOCI. ?- NEGATIVE FOR MALIGNANCY. ? ?Comment: ?Lymphoid follicles with germinal centers are present, but unlikely to ?have been visible clinically due to their small size and limited ?distribution. ?The cystitis cystica and cystitis glandularis are extensive and likely ?account for the visible mucosal changes. ?Patchy squamous metaplasia may also be contributing to the clinical ?appearance. ? ?GROSS DESCRIPTION: ?A.  Labeled: Bladder biopsy trigone ?Received: Formalin ?Collection time: 12:28 PM on 11/30/2021 ?Placed into formalin time: 12:32 PM on 11/30/2021 ?Tissue fragment(s): Multiple ?Size: Aggregate, 1.5 x 0.6 x 0.2 cm ?Description: Received on Telfa pad are fragments of tan-pink soft ?tissue. ?Entirely submitted in 1 cassette. ? ?RB 11/30/2021 ? ? ?Final Diagnosis performed by Bryan Lemma, MD.   Electronically signed ?12/01/2021 12:17:53PM ?The electronic signature indicates that the named Attending Pathologist ?has evaluated the specimen ?Technical component performed  at [facility], [address] [place], ?[place] [address] Lab: [phone] Dir: Rush Farmer, MD, MMM ? Professional component performed at [facility], [facility], [place], [place], [place] [address] Lab: [phone] ?Dir: Kathi Simpers, MD ?  ? ?   ?Assessment & Plan:  ? ?Problem List Items Addressed This Visit   ? ?  ? Cardiovascular and Mediastinum  ? Atrial fibrillation ([place])  ?  Continue to follow with cardiology. Call with any concerns. Continue to monitor.  ? ?  ?  ? Relevant Medications  ? lisinopril-hydrochlorothiazide (ZESTORETIC) 20-25 MG tablet  ? hydrALAZINE (APRESOLINE) 50 MG tablet  ? amLODipine (NORVASC) 2.5 MG tablet  ? Hardening of the aorta (main artery of the heart) ([place])  ?  Will keep BP and cholesterol under good control. Checking labs today. Continue to follow with cardiology.  ? ?  ?  ? Relevant Medications  ? lisinopril-hydrochlorothiazide (  ZESTORETIC) 20-25 MG tablet  ? hydrALAZINE (APRESOLINE) 50 MG tablet  ? amLODipine (NORVASC) 2.5 MG tablet  ?  ? Genitourinary  ? Benign hypertensive renal disease - Primary  ?  Under good control on current regimen. Continue current regimen. Continue to monitor. Call with any concerns. Refills given. Labs drawn today.  ? ? ?  ?  ? Relevant Orders  ? Comprehensive metabolic panel  ? TSH  ? Microalbumin, Urine Waived  ?  ? Other  ? HLD (hyperlipidemia)  ?  Rechecking labs today. Await results. Treat as needed.  ? ?  ?  ? Relevant Medications  ? lisinopril-hydrochlorothiazide (ZESTORETIC) 20-25 MG tablet  ? hydrALAZINE (APRESOLINE) 50 MG tablet  ? amLODipine (NORVASC) 2.5 MG tablet  ? Other Relevant Orders  ? Comprehensive metabolic panel  ? Lipid Panel w/o Chol/HDL Ratio  ? Iron deficiency anemia due to chronic blood loss  ?  Continue to follow with oncology. Rechecking labs today. Await results. Treat as needed.  ? ?  ?  ? Relevant Orders  ? CBC with Differential/Platelet  ? ?Other Visit Diagnoses   ? ? Upper respiratory tract  infection, unspecified type      ? Improving. Start albuterol and finish prednisone. Call with any concerns.   ? History of UTI      ? Will check UA. Await results.   ? Relevant Orders  ? Urinalysis, Routine w refl

## 2022-02-04 NOTE — Addendum Note (Signed)
Addended by: Valerie Roys on: 02/04/2022 12:17 PM ? ? Modules accepted: Orders ? ?

## 2022-02-04 NOTE — Assessment & Plan Note (Signed)
Under good control on current regimen. Continue current regimen. Continue to monitor. Call with any concerns. Refills given. Labs drawn today.   

## 2022-02-04 NOTE — Assessment & Plan Note (Signed)
Rechecking labs today. Await results. Treat as needed.  °

## 2022-02-05 ENCOUNTER — Encounter: Payer: Self-pay | Admitting: Family Medicine

## 2022-02-05 LAB — COMPREHENSIVE METABOLIC PANEL
ALT: 28 IU/L (ref 0–32)
AST: 23 IU/L (ref 0–40)
Albumin/Globulin Ratio: 1.4 (ref 1.2–2.2)
Albumin: 4.5 g/dL (ref 3.8–4.8)
Alkaline Phosphatase: 77 IU/L (ref 44–121)
BUN/Creatinine Ratio: 31 — ABNORMAL HIGH (ref 12–28)
BUN: 30 mg/dL — ABNORMAL HIGH (ref 8–27)
Bilirubin Total: 0.7 mg/dL (ref 0.0–1.2)
CO2: 22 mmol/L (ref 20–29)
Calcium: 9.8 mg/dL (ref 8.7–10.3)
Chloride: 97 mmol/L (ref 96–106)
Creatinine, Ser: 0.96 mg/dL (ref 0.57–1.00)
Globulin, Total: 3.3 g/dL (ref 1.5–4.5)
Glucose: 85 mg/dL (ref 70–99)
Potassium: 3.8 mmol/L (ref 3.5–5.2)
Sodium: 142 mmol/L (ref 134–144)
Total Protein: 7.8 g/dL (ref 6.0–8.5)
eGFR: 66 mL/min/{1.73_m2} (ref >59)

## 2022-02-05 LAB — CBC WITH DIFFERENTIAL/PLATELET
Basophils Absolute: 0 10*3/uL (ref 0.0–0.2)
Basos: 1 %
EOS (ABSOLUTE): 0.1 10*3/uL (ref 0.0–0.4)
Eos: 1 %
Hematocrit: 45 % (ref 34.0–46.6)
Hemoglobin: 14.5 g/dL (ref 11.1–15.9)
Immature Grans (Abs): 0.1 10*3/uL (ref 0.0–0.1)
Immature Granulocytes: 2 %
Lymphocytes Absolute: 0.9 10*3/uL (ref 0.7–3.1)
Lymphs: 15 %
MCH: 26.8 pg (ref 26.6–33.0)
MCHC: 32.2 g/dL (ref 31.5–35.7)
MCV: 83 fL (ref 79–97)
Monocytes Absolute: 0.2 10*3/uL (ref 0.1–0.9)
Monocytes: 4 %
Neutrophils Absolute: 4.7 10*3/uL (ref 1.4–7.0)
Neutrophils: 77 %
Platelets: 164 10*3/uL (ref 150–450)
RBC: 5.42 x10E6/uL — ABNORMAL HIGH (ref 3.77–5.28)
RDW: 15.5 % — ABNORMAL HIGH (ref 11.7–15.4)
WBC: 6 10*3/uL (ref 3.4–10.8)

## 2022-02-05 LAB — LIPID PANEL W/O CHOL/HDL RATIO
Cholesterol, Total: 189 mg/dL (ref 100–199)
HDL: 57 mg/dL (ref >39)
LDL Chol Calc (NIH): 104 mg/dL — ABNORMAL HIGH (ref 0–99)
Triglycerides: 160 mg/dL — ABNORMAL HIGH (ref 0–149)
VLDL Cholesterol Cal: 28 mg/dL (ref 5–40)

## 2022-02-05 LAB — TSH: TSH: 1.17 u[IU]/mL (ref 0.450–4.500)

## 2022-02-06 LAB — URINE CULTURE

## 2022-02-11 ENCOUNTER — Other Ambulatory Visit: Payer: Self-pay

## 2022-02-11 ENCOUNTER — Emergency Department
Admission: EM | Admit: 2022-02-11 | Discharge: 2022-02-11 | Disposition: A | Discharge status: Home / Self Care | Payer: Medicare Other | Attending: Emergency Medicine | Admitting: Emergency Medicine

## 2022-02-11 DIAGNOSIS — H1089 Other conjunctivitis: Secondary | ICD-10-CM | POA: Diagnosis not present

## 2022-02-11 DIAGNOSIS — H5710 Ocular pain, unspecified eye: Secondary | ICD-10-CM | POA: Diagnosis not present

## 2022-02-11 DIAGNOSIS — R609 Edema, unspecified: Secondary | ICD-10-CM | POA: Diagnosis not present

## 2022-02-11 DIAGNOSIS — B9689 Other specified bacterial agents as the cause of diseases classified elsewhere: Secondary | ICD-10-CM | POA: Diagnosis not present

## 2022-02-11 DIAGNOSIS — H109 Unspecified conjunctivitis: Secondary | ICD-10-CM

## 2022-02-11 DIAGNOSIS — I2510 Atherosclerotic heart disease of native coronary artery without angina pectoris: Secondary | ICD-10-CM | POA: Diagnosis not present

## 2022-02-11 DIAGNOSIS — I10 Essential (primary) hypertension: Secondary | ICD-10-CM | POA: Insufficient documentation

## 2022-02-11 DIAGNOSIS — H5789 Other specified disorders of eye and adnexa: Secondary | ICD-10-CM | POA: Diagnosis present

## 2022-02-11 DIAGNOSIS — R52 Pain, unspecified: Secondary | ICD-10-CM | POA: Diagnosis not present

## 2022-02-11 DIAGNOSIS — H1033 Unspecified acute conjunctivitis, bilateral: Secondary | ICD-10-CM | POA: Diagnosis not present

## 2022-02-11 MED ORDER — OFLOXACIN 0.3 % OP SOLN: 2.0000 [drp] | Freq: Four times a day (QID) | OPHTHALMIC | 0 refills | Status: AC

## 2022-02-11 NOTE — ED Triage Notes (Signed)
Pt to ED from home [facility] Pt complains of eye drainage both eyes since last night Denies pain. L eye was itching yesterday. Both eyes have yellow drainage and sclera appear reddened

## 2022-02-11 NOTE — ED Provider Notes (Signed)
[facility] Provider Note    Event Date/Time   First MD Initiated Contact with Patient 02/11/22 1157     (approximate)   History   Chief Complaint Eye Drainage   HPI Kristina Henson is a 66 y.o. female, history of atrial fibrillation, hyperlipidemia, CVA, OSA, hypertension, CAD, presents to the emergency department for evaluation of eye drainage.  Patient states that she began experiencing a sudden onset of bilateral eye drainage that started yesterday.  Reports significant yellow/green discharge, as well as crusting.  She states that she is currently getting over a upper respiratory tract infection, but otherwise does not experience any other symptoms.  Denies blurred vision, fever/chills, nausea/vomiting, lightheadedness/dizziness, rashes/lesions, or hearing changes.  History Limitations: No limitations.        Physical Exam  Triage Vital Signs: ED Triage Vitals  Enc Vitals Group     BP 02/11/22 1121 (!) 129/93     Pulse Rate 02/11/22 1121 (!) 104     Resp 02/11/22 1121 20     Temp 02/11/22 1121 98.5 F (36.9 C)     Temp Source 02/11/22 1121 Oral     SpO2 02/11/22 1121 97 %     Weight 02/11/22 1119 167 lb (75.8 kg)     Height 02/11/22 1119 '5\' 2"'$  (1.575 m)     Head Circumference --      Peak Flow --      Pain Score 02/11/22 1119 0     Pain Loc --      Pain Edu? --      Excl. in [place]? --     Most recent vital signs: Vitals:   02/11/22 1121  BP: (!) 129/93  Pulse: (!) 104  Resp: 20  Temp: 98.5 F (36.9 C)  SpO2: 97%    General: Awake, NAD.  Skin: Warm, dry. No rashes or lesions.  CV: Good peripheral perfusion.  Resp: Normal effort.  Abd: Soft, non-tender. No distention.  Neuro: At baseline. No gross neurological deficits.   Focused Exam: Eyes are diffusely erythematous with an impressive amount of green, purulent drainage bilaterally.  Crusting noted along the conjunctival as well.  Visual fields intact.  Visual acuity grossly  intact.  No surrounding erythema, warmth, or tenderness along the orbits.  No pain with extraocular movements.  Physical Exam    ED Results / Procedures / Treatments  Labs (all labs ordered are listed, but only abnormal results are displayed) Labs Reviewed - No data to display   EKG N/A.   RADIOLOGY  ED Provider Interpretation: N/A.  No results found.  PROCEDURES:  Critical Care performed: N/A.  Procedures    MEDICATIONS ORDERED IN ED: Medications - No data to display   IMPRESSION / MDM / [place] / ED COURSE  I reviewed the triage vital signs and the nursing notes.                              Differential diagnosis includes, but is not limited to, bacterial conjunctivitis, viral conjunctivitis, periorbital cellulitis.  ED Course Patient appears well.  NAD.  Assessment/Plan Presentation consistent with bacterial conjunctivitis.  No evidence of periorbital or orbital cellulitis.  Patient denies any recent sexual contacts to suggest chlamydial/gonococcal conjunctivitis.  Will prescribe her ofloxacin otic drops and give her referral to ophthalmology for follow-up given the impressive nature of the exam.  Patient otherwise appears well.  We will plan to  discharge.  Provided the patient with anticipatory guidance, return precautions, and educational material. Encouraged the patient to return to the emergency department at any time if they begin to experience any new or worsening symptoms. Patient expressed understanding and agreed with the plan.       FINAL CLINICAL IMPRESSION(S) / ED DIAGNOSES   Final diagnoses:  Bacterial conjunctivitis of both eyes     Rx / DC Orders   ED Discharge Orders          Ordered    ofloxacin (OCUFLOX) 0.3 % ophthalmic solution  4 times daily        02/11/22 1237             Note:  This document was prepared using Dragon voice recognition software and may include unintentional dictation errors.   Teodoro Spray, [place] 02/11/22 1249    Vanessa [facility], MD 02/11/22 [phone]

## 2022-02-11 NOTE — Discharge Instructions (Addendum)
-  Take all of your antibiotics as prescribed.  -Follow-up with the ophthalmologist listed above if your symptoms fail to improve within a few days.  -You may take Tylenol/ibuprofen as needed for pain/irritation  -Return to the emergency department anytime if you begin to experience any new or worsening symptoms.

## 2022-02-11 NOTE — ED Triage Notes (Signed)
First Nurse Note;  Pt via EMS from home. Pt c/o bilateral eye drainage and redness since last night. Pt is A&OX4 and NAD.  120/62 97% on RA 94 HR

## 2022-02-15 ENCOUNTER — Telehealth: Payer: Self-pay | Admitting: *Deleted

## 2022-02-15 DIAGNOSIS — B309 Viral conjunctivitis, unspecified: Secondary | ICD-10-CM | POA: Diagnosis not present

## 2022-02-15 DIAGNOSIS — H401131 Primary open-angle glaucoma, bilateral, mild stage: Secondary | ICD-10-CM | POA: Diagnosis not present

## 2022-02-15 NOTE — Telephone Encounter (Signed)
Transition Care Management Follow-up Telephone Call Date of discharge and from where: [facility] 02-11-2022 How have you been since you were released from the hospital? Doing much better no more swelling Any questions or concerns? No  Items Reviewed: Did the pt receive and understand the discharge instructions provided? Yes  Medications obtained and verified? Yes  Other? No  Any new allergies since your discharge? No  Dietary orders reviewed? No Do you have support at home? Yes   Home Care and Equipment/Supplies: Were home health services ordered? not applicable If so, what is the name of the agency?   Has the agency set up a time to come to the patient's home? not applicable Were any new equipment or medical supplies ordered?   What is the name of the medical supply agency?  Were you able to get the supplies/equipment? not applicable Do you have any questions related to the use of the equipment or supplies?   Functional Questionnaire: (I = Independent and D = Dependent) ADLs: I  Bathing/Dressing- I  Meal Prep- I  Eating- I  Maintaining continence- I  Transferring/Ambulation- I  Managing Meds- I  Follow up appointments reviewed:  PCP Hospital f/u appt confirmed? No   Specialist Hospital f/u appt confirmed? Yes  Scheduled to see 02-15-2022 on OPTHALMOLOGY. Are transportation arrangements needed? No  If their condition worsens, is the pt aware to call PCP or go to the Emergency Dept.? Yes Was the patient provided with contact information for the PCP's office or ED? Yes Was to pt encouraged to call back with questions or concerns? Yes

## 2022-02-23 ENCOUNTER — Telehealth: Payer: Self-pay

## 2022-02-23 NOTE — Telephone Encounter (Signed)
Pt called and stated that she uses a CPAP machine at night to help her sleep, she states that the air in the mask has too much pressure. She would like the air pressure lowered. She needs a DME order to be sent to [facility]. [address] [place], [place] [address]. [phone] phone. Please advise pt.

## 2022-02-24 NOTE — Telephone Encounter (Signed)
Are you able to change this ?

## 2022-02-24 NOTE — Telephone Encounter (Signed)
This is not something we can do as we base the pressure based on her sleep study results. She would need to talk to whoever read her sleep study about adjusting the pressure

## 2022-02-24 NOTE — Telephone Encounter (Signed)
Attempted to contact patient NA/LVM to call back

## 2022-02-25 NOTE — Telephone Encounter (Signed)
Patient returned call advised per providers message below. Patient expressed understanding and states she will call the [facility].

## 2022-03-10 ENCOUNTER — Encounter: Payer: Self-pay | Admitting: Family Medicine

## 2022-03-10 ENCOUNTER — Ambulatory Visit (INDEPENDENT_AMBULATORY_CARE_PROVIDER_SITE_OTHER): Payer: Medicare Other | Admitting: Family Medicine

## 2022-03-10 VITALS — BP 129/83 | HR 91 | Temp 97.7°F | Wt 167.8 lb

## 2022-03-10 DIAGNOSIS — G4733 Obstructive sleep apnea (adult) (pediatric): Secondary | ICD-10-CM | POA: Diagnosis not present

## 2022-03-10 DIAGNOSIS — R319 Hematuria, unspecified: Secondary | ICD-10-CM

## 2022-03-10 DIAGNOSIS — J440 Chronic obstructive pulmonary disease with acute lower respiratory infection: Secondary | ICD-10-CM | POA: Diagnosis not present

## 2022-03-10 DIAGNOSIS — J449 Chronic obstructive pulmonary disease, unspecified: Secondary | ICD-10-CM | POA: Insufficient documentation

## 2022-03-10 DIAGNOSIS — J209 Acute bronchitis, unspecified: Secondary | ICD-10-CM

## 2022-03-10 DIAGNOSIS — R053 Chronic cough: Secondary | ICD-10-CM | POA: Diagnosis not present

## 2022-03-10 LAB — URINALYSIS, ROUTINE W REFLEX MICROSCOPIC
Bilirubin, UA: NEGATIVE
Glucose, UA: NEGATIVE
Ketones, UA: NEGATIVE
Nitrite, UA: NEGATIVE
Protein,UA: NEGATIVE
RBC, UA: NEGATIVE
Specific Gravity, UA: 1.015 (ref 1.005–1.030)
Urobilinogen, Ur: 0.2 mg/dL (ref 0.2–1.0)
pH, UA: 6 (ref 5.0–7.5)

## 2022-03-10 LAB — MICROSCOPIC EXAMINATION: Bacteria, UA: NONE SEEN

## 2022-03-10 MED ORDER — AZITHROMYCIN 250 MG PO TABS: ORAL_TABLET | ORAL | 0 refills | Status: AC

## 2022-03-10 MED ORDER — PREDNISONE 10 MG PO TABS: 10.0000 mg | ORAL_TABLET | Freq: Every day | ORAL | 0 refills | Status: DC

## 2022-03-10 MED ORDER — BREZTRI AEROSPHERE 160-9-4.8 MCG/ACT IN AERO: 2.0000 | INHALATION_SPRAY | Freq: Two times a day (BID) | RESPIRATORY_TRACT | 11 refills | Status: DC

## 2022-03-10 NOTE — Progress Notes (Signed)
BP 129/83   Pulse 91   Temp 97.7 F (36.5 C)   Wt 167 lb 12.8 oz (76.1 kg)   SpO2 97%   BMI 30.69 kg/m    Subjective:    Patient ID: Kristina Henson, female    DOB: [birth year], 66 y.o.   MRN: [id number]  HPI: Kristina Henson is a 66 y.o. female  Chief Complaint  Patient presents with   URI    Patient is here to follow up on breathing, patient states she was getting better but has started coughing again. Patient states she is coughing up yellow mucus.    Hematuria    Patient states a few days ago she noticed blood in her urine twice.    UPPER RESPIRATORY TRACT INFECTION Duration: a couple of weeks Worst symptom: cough Fever: no Cough: yes Shortness of breath: yes Wheezing: yes Chest pain: yes, with cough Chest tightness: yes Chest congestion: yes Nasal congestion: no Runny nose: no Post nasal drip: no Sneezing: no Sore throat: no Swollen glands: no Sinus pressure: no Headache: no Face pain: no Toothache: no Ear pain: no  Ear pressure: no  Eyes red/itching:no Eye drainage/crusting: no  Vomiting: no Rash: no Fatigue: yes Sick contacts: no Strep contacts: no  Context: worse Recurrent sinusitis: no Relief with OTC cold/cough medications: no  Treatments attempted: none   SLEEP APNEA Sleep apnea status: uncontrolled Duration: chronic Satisfied with current treatment?:  no CPAP use:  no Sleep quality with CPAP use: poor Treament compliance:poor compliance Last sleep study: 11/05/21 Treatments attempted: CPAP Wakes feeling refreshed:  no Daytime hypersomnolence:  yes Fatigue:  yes Insomnia:  no Good sleep hygiene:  yes Difficulty falling asleep:  yes Difficulty staying asleep:  yes Snoring bothers bed partner:  no Observed apnea by bed partner: yes Obesity:  yes Hypertension: yes  Pulmonary hypertension:  no Coronary artery disease:  no   Relevant past medical, surgical, family and social history reviewed and updated as indicated. Interim  medical history since our last visit reviewed. Allergies and medications reviewed and updated.  Review of Systems  Constitutional: Negative.   HENT: Negative.    Respiratory:  Positive for cough, chest tightness, shortness of breath and wheezing. Negative for apnea, choking and stridor.   Cardiovascular: Negative.   Gastrointestinal: Negative.   Psychiatric/Behavioral: Negative.      Per HPI unless specifically indicated above     Objective:    BP 129/83   Pulse 91   Temp 97.7 F (36.5 C)   Wt 167 lb 12.8 oz (76.1 kg)   SpO2 97%   BMI 30.69 kg/m   Wt Readings from Last 3 Encounters:  03/10/22 167 lb 12.8 oz (76.1 kg)  02/11/22 167 lb (75.8 kg)  02/04/22 167 lb 9.6 oz (76 kg)    Physical Exam Vitals and nursing note reviewed.  Constitutional:      General: She is not in acute distress.    Appearance: Normal appearance. She is obese. She is not ill-appearing, toxic-appearing or diaphoretic.  HENT:     Head: Normocephalic and atraumatic.     Right Ear: External ear normal.     Left Ear: External ear normal.     Nose: Nose normal.     Mouth/Throat:     Mouth: Mucous membranes are moist.     Pharynx: Oropharynx is clear.  Eyes:     General: No scleral icterus.       Right eye: No discharge.  Left eye: No discharge.     Extraocular Movements: Extraocular movements intact.     Conjunctiva/sclera: Conjunctivae normal.     Pupils: Pupils are equal, round, and reactive to light.  Cardiovascular:     Rate and Rhythm: Normal rate and regular rhythm.     Pulses: Normal pulses.     Heart sounds: Normal heart sounds. No murmur heard.    No friction rub. No gallop.  Pulmonary:     Effort: Pulmonary effort is normal. No respiratory distress.     Breath sounds: No stridor. Wheezing and rhonchi present. No rales.  Chest:     Chest wall: No tenderness.  Musculoskeletal:        General: Normal range of motion.     Cervical back: Normal range of motion and neck supple.   Skin:    General: Skin is warm and dry.     Capillary Refill: Capillary refill takes less than 2 seconds.     Coloration: Skin is not jaundiced or pale.     Findings: No bruising, erythema, lesion or rash.  Neurological:     General: No focal deficit present.     Mental Status: She is alert and oriented to person, place, and time. Mental status is at baseline.  Psychiatric:        Mood and Affect: Mood normal.        Behavior: Behavior normal.        Thought Content: Thought content normal.        Judgment: Judgment normal.     Results for orders placed or performed in visit on 03/10/22  Microscopic Examination   Urine  Result Value Ref Range   WBC, UA 0-5 0 - 5 /hpf   RBC 0-2 0 - 2 /hpf   Epithelial Cells (non renal) 0-10 0 - 10 /hpf   Bacteria, UA None seen None seen/Few  Urinalysis, Routine w reflex microscopic  Result Value Ref Range   Specific Gravity, UA 1.015 1.005 - 1.030   pH, UA 6.0 5.0 - 7.5   Color, UA Yellow Yellow   Appearance Ur Clear Clear   Leukocytes,UA Trace (A) Negative   Protein,UA Negative Negative/Trace   Glucose, UA Negative Negative   Ketones, UA Negative Negative   RBC, UA Negative Negative   Bilirubin, UA Negative Negative   Urobilinogen, Ur 0.2 0.2 - 1.0 mg/dL   Nitrite, UA Negative Negative   Microscopic Examination See below:       Assessment & Plan:   Problem List Items Addressed This Visit       Respiratory   OSA (obstructive sleep apnea)    Not tolerating CPAP. Will get her into ENT to see if there are other options.       Relevant Orders   Ambulatory referral to ENT   Acute bronchitis with COPD ([place]) - Primary    Will treat with prednisone and azithromycin and start breztri. Call with any concerns. Recheck 2 weeks.       Relevant Medications   predniSONE (DELTASONE) 10 MG tablet   azithromycin (ZITHROMAX) 250 MG tablet   Budeson-Glycopyrrol-Formoterol (BREZTRI AEROSPHERE) 160-9-4.8 MCG/ACT AERO   Other Visit Diagnoses      Chronic cough       COPD.    Relevant Orders   Spirometry with graph (Completed)   Hematuria, unspecified type       No blood on UA today.   Relevant Orders   Urinalysis, Routine w reflex microscopic (Completed)  Follow up plan: Return in about 2 weeks (around 03/24/2022).

## 2022-03-10 NOTE — Assessment & Plan Note (Signed)
Will treat with prednisone and azithromycin and start breztri. Call with any concerns. Recheck 2 weeks.

## 2022-03-10 NOTE — Assessment & Plan Note (Signed)
Not tolerating CPAP. Will get her into ENT to see if there are other options.

## 2022-03-25 ENCOUNTER — Ambulatory Visit (INDEPENDENT_AMBULATORY_CARE_PROVIDER_SITE_OTHER): Payer: Medicare Other | Admitting: Family Medicine

## 2022-03-25 ENCOUNTER — Encounter: Payer: Self-pay | Admitting: Family Medicine

## 2022-03-25 DIAGNOSIS — J42 Unspecified chronic bronchitis: Secondary | ICD-10-CM

## 2022-03-25 NOTE — Assessment & Plan Note (Signed)
Doing much better on the breztri. Continue to monitor. Call with any concerns.

## 2022-03-25 NOTE — Progress Notes (Signed)
BP 132/86   Pulse (!) 110   Temp (!) 97.3 F (36.3 C) (Oral)   Wt 171 lb 6.4 oz (77.7 kg)   SpO2 96%   BMI 31.35 kg/m    Subjective:    Patient ID: Kristina Henson, female    DOB: 03-Mar-1956, 66 y.o.   MRN: [id number]  HPI: Kristina Henson is a 66 y.o. female  Chief Complaint  Patient presents with   COPD   COPD COPD status: better Satisfied with current treatment?: yes Oxygen use: no Dyspnea frequency: never Cough frequency: rarely Rescue inhaler frequency: in the AM   Limitation of activity: yes Productive cough:  Last Spirometry: 2 weeks ago Pneumovax: Up to Date Influenza: Up to Date   Relevant past medical, surgical, family and social history reviewed and updated as indicated. Interim medical history since our last visit reviewed. Allergies and medications reviewed and updated.  Review of Systems  Constitutional: Negative.   Respiratory: Negative.    Cardiovascular: Negative.   Gastrointestinal: Negative.   Musculoskeletal: Negative.   Psychiatric/Behavioral: Negative.      Per HPI unless specifically indicated above     Objective:    BP 132/86   Pulse (!) 110   Temp (!) 97.3 F (36.3 C) (Oral)   Wt 171 lb 6.4 oz (77.7 kg)   SpO2 96%   BMI 31.35 kg/m   Wt Readings from Last 3 Encounters:  03/25/22 171 lb 6.4 oz (77.7 kg)  03/10/22 167 lb 12.8 oz (76.1 kg)  02/11/22 167 lb (75.8 kg)    Physical Exam Vitals and nursing note reviewed.  Constitutional:      General: She is not in acute distress.    Appearance: Normal appearance. She is obese. She is not ill-appearing, toxic-appearing or diaphoretic.  HENT:     Head: Normocephalic and atraumatic.     Right Ear: External ear normal.     Left Ear: External ear normal.     Nose: Nose normal.     Mouth/Throat:     Mouth: Mucous membranes are moist.     Pharynx: Oropharynx is clear.  Eyes:     General: No scleral icterus.       Right eye: No discharge.        Left eye: No discharge.      Extraocular Movements: Extraocular movements intact.     Conjunctiva/sclera: Conjunctivae normal.     Pupils: Pupils are equal, round, and reactive to light.  Cardiovascular:     Rate and Rhythm: Normal rate and regular rhythm.     Pulses: Normal pulses.     Heart sounds: Normal heart sounds. No murmur heard.    No friction rub. No gallop.  Pulmonary:     Effort: Pulmonary effort is normal. No respiratory distress.     Breath sounds: Normal breath sounds. No stridor. No wheezing, rhonchi or rales.  Chest:     Chest wall: No tenderness.  Musculoskeletal:        General: Normal range of motion.     Cervical back: Normal range of motion and neck supple.  Skin:    General: Skin is warm and dry.     Capillary Refill: Capillary refill takes less than 2 seconds.     Coloration: Skin is not jaundiced or pale.     Findings: No bruising, erythema, lesion or rash.  Neurological:     General: No focal deficit present.     Mental Status: She is alert and  oriented to person, place, and time. Mental status is at baseline.  Psychiatric:        Mood and Affect: Mood normal.        Behavior: Behavior normal.        Thought Content: Thought content normal.        Judgment: Judgment normal.     Results for orders placed or performed in visit on 03/10/22  Microscopic Examination   Urine  Result Value Ref Range   WBC, UA 0-5 0 - 5 /hpf   RBC, Urine 0-2 0 - 2 /hpf   Epithelial Cells (non renal) 0-10 0 - 10 /hpf   Bacteria, UA None seen None seen/Few  Urinalysis, Routine w reflex microscopic  Result Value Ref Range   Specific Gravity, UA 1.015 1.005 - 1.030   pH, UA 6.0 5.0 - 7.5   Color, UA Yellow Yellow   Appearance Ur Clear Clear   Leukocytes,UA Trace (A) Negative   Protein,UA Negative Negative/Trace   Glucose, UA Negative Negative   Ketones, UA Negative Negative   RBC, UA Negative Negative   Bilirubin, UA Negative Negative   Urobilinogen, Ur 0.2 0.2 - 1.0 mg/dL   Nitrite, UA Negative  Negative   Microscopic Examination See below:       Assessment & Plan:   Problem List Items Addressed This Visit       Respiratory   COPD (chronic obstructive pulmonary disease) ([place])    Doing much better on the breztri. Continue to monitor. Call with any concerns.         Follow up plan: Return November.

## 2022-04-22 DIAGNOSIS — G4733 Obstructive sleep apnea (adult) (pediatric): Secondary | ICD-10-CM | POA: Diagnosis not present

## 2022-04-27 ENCOUNTER — Ambulatory Visit (INDEPENDENT_AMBULATORY_CARE_PROVIDER_SITE_OTHER): Payer: Medicare Other | Admitting: Physician Assistant

## 2022-04-27 ENCOUNTER — Encounter: Payer: Self-pay | Admitting: Physician Assistant

## 2022-04-27 VITALS — BP 107/73 | HR 102 | Temp 97.4°F | Ht 62.13 in | Wt 172.9 lb

## 2022-04-27 DIAGNOSIS — M25512 Pain in left shoulder: Secondary | ICD-10-CM | POA: Diagnosis not present

## 2022-04-27 MED ORDER — MELOXICAM 7.5 MG PO TABS: 7.5000 mg | ORAL_TABLET | Freq: Every day | ORAL | 0 refills | Status: DC

## 2022-04-27 NOTE — Patient Instructions (Addendum)
To help with the pain in your shoulder I recommend the following:  You can use Meloxicam once per day with food and plenty of water If you still have a bit of pain after taking the Meloxicam you can take Tylenol as needed (no more than 3500 mg per day)  Continue to use your heating pad on the areas that are hurting  You can also use gentle massage and stretches to improve your discomfort  I have included some shoulder exercises for you to do as you are able that can help prevent stiffness  If your pain is not improving in the next 3-4 weeks please let us know and we can talk about sending you to physical therapy for help.

## 2022-04-27 NOTE — Progress Notes (Signed)
Established Patient Office Visit  Name: Kristina Henson   MRN: [id number]    DOB: Feb 03, 1956   Date:04/27/2022  Today's Provider: Talitha Givens, MHS, PA-C Introduced myself to the patient as a PA-C and provided education on APPs in clinical practice.         Subjective  Chief Complaint  Chief Complaint  Patient presents with   Back Pain    And having muscle spasms in legs ,and neck and shoulders for past week or so    Back Pain Pertinent negatives include no headaches.    Onset: gradual Duration: a few weeks ago She does not recall any injuries but reports she did lift a heavy object and a few days later the pain started Location: top her back, left shoulder,  States her neck and upper back hurt when she bends over States pain comes and goes in her neck and thoracic back  Pain level: 10/10 aching pain Aggravating: bending over, sleeping on left side and shoulder  Alleviating: nothing , but pain will stop intermittently and come back Interventions: She has been taking Tylenol relief? She has been taking that for about a week. She has tried using a heating pad as well  She has been using her Baclofen and if mixed with heating pad this provides relief     Patient Active Problem List   Diagnosis Date Noted   COPD (chronic obstructive pulmonary disease) ([place]) 03/10/2022   OSA (obstructive sleep apnea) 08/07/2021   History of colonic polyps    Polyp of transverse colon    Insomnia 07/30/2020   HSV infection 12/20/2019   Advanced directives, counseling/discussion 10/16/2018   Hardening of the aorta (main artery of the heart) ([place]) 04/03/2018   H/O submandibular gland removal 09/08/2017   Liver hemangioma 06/14/2016   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   Anemia due to chronic illness 04/30/2016   Iron deficiency anemia due to chronic blood loss    Benign neoplasm of sigmoid colon    Benign hypertensive renal disease    Atrial fibrillation ([facility])    HLD  (hyperlipidemia) 07/11/2014   History of CVA with residual deficit 07/11/2014    Past Surgical History:  Procedure Laterality Date   BIOPSY SALIVARY GLAND Left 04/29/2016   Procedure: SUBMANDIBULAR GLAND BIOPSY (FNA); Location: [facility]; Surgeon: Markus Daft, MD (IR)   cardiac catherization     CARDIAC ELECTROPHYSIOLOGY STUDY AND ABLATION N/A 2008   Procedure: ATRIAL FIBRILLATION ABLATION; Location: [facility]   COLONOSCOPY WITH PROPOFOL N/A 11/18/2015   Procedure: COLONOSCOPY WITH PROPOFOL;  Surgeon: Lucilla Lame, MD;  Location: [facility] ENDOSCOPY;  Service: Endoscopy;  Laterality: N/A;   COLONOSCOPY WITH PROPOFOL N/A 12/02/2020   Procedure: COLONOSCOPY WITH PROPOFOL;  Surgeon: Lucilla Lame, MD;  Location: [facility] ENDOSCOPY;  Service: Endoscopy;  Laterality: N/A;   CORONARY ANGIOPLASTY     CYSTOSCOPY WITH BIOPSY N/A 11/30/2021   Procedure: CYSTOSCOPY WITH BLADDER BIOPSY;  Surgeon: Hollice Espy, MD;  Location: [facility] ORS;  Service: Urology;  Laterality: N/A;   ESOPHAGOGASTRODUODENOSCOPY (EGD) WITH PROPOFOL N/A 04/23/2016   Procedure: ESOPHAGOGASTRODUODENOSCOPY (EGD) WITH PROPOFOL;  Surgeon: Lucilla Lame, MD;  Location: [facility];  Service: Endoscopy;  Laterality: N/A;  small bowel bx gastric antrum bx   SUBMANDIBULAR GLAND EXCISION Left 09/08/2017   Procedure: EXCISION SUBMANDIBULAR GLAND;  Surgeon: Carloyn Manner, MD;  Location: [facility] ORS;  Service: ENT;  Laterality: Left;   TOTAL ABDOMINAL HYSTERECTOMY W/ BILATERAL SALPINGOOPHORECTOMY  Total    Family History  Problem Relation Age of Onset   Arthritis Mother    Cancer Mother    Hypertension Sister    Asthma Brother    Dementia Brother    Breast cancer Neg Hx    Kidney cancer Neg Hx    Bladder Cancer Neg Hx     Social History   Tobacco Use   Smoking status: Former    Types: Cigarettes    Quit date: 1978    Years since quitting: 45.6   Smokeless tobacco: Never   Tobacco comments:    didnt smoke  much   Substance Use Topics   Alcohol use: Yes    Alcohol/week: 1.0 standard drink of alcohol    Types: 1 Glasses of wine per week    Comment: occasionally     Current Outpatient Medications:    albuterol (VENTOLIN HFA) 108 (90 Base) MCG/ACT inhaler, Inhale 2 puffs into the lungs every 6 (six) hours as needed for wheezing or shortness of breath., Disp: 8 g, Rfl: 0   amiodarone (PACERONE) 200 MG tablet, Take 1 tablet (200 mg total) by mouth every morning., Disp: 30 tablet, Rfl: 0   amLODipine (NORVASC) 2.5 MG tablet, Take 1 tablet (2.5 mg total) by mouth daily., Disp: 90 tablet, Rfl: 1   aspirin EC 81 MG tablet, Take 81 mg by mouth daily. , Disp: , Rfl:    baclofen (LIORESAL) 10 MG tablet, Take 1 tablet (10 mg total) by mouth at bedtime as needed for muscle spasms., Disp: 30 each, Rfl: 3   Budeson-Glycopyrrol-Formoterol (BREZTRI AEROSPHERE) 160-9-4.8 MCG/ACT AERO, Inhale 2 puffs into the lungs 2 (two) times daily., Disp: 10.7 g, Rfl: 11   chlorhexidine (PERIDEX) 0.12 % solution, 7.5 mLs by Mouth Rinse route every evening., Disp: , Rfl:    ferrous sulfate 325 (65 FE) MG EC tablet, Take 1 tablet (325 mg total) by mouth 3 (three) times daily with meals. (Patient taking differently: Take 325 mg by mouth daily with breakfast.), Disp: 270 tablet, Rfl: 1   hydrALAZINE (APRESOLINE) 50 MG tablet, Take 1 tablet (50 mg total) by mouth 2 (two) times daily., Disp: 180 tablet, Rfl: 1   lisinopril-hydrochlorothiazide (ZESTORETIC) 20-25 MG tablet, Take 1 tablet by mouth 2 (two) times daily., Disp: 180 tablet, Rfl: 1   traZODone (DESYREL) 50 MG tablet, Take 1-2 tablets (50-100 mg total) by mouth at bedtime as needed for sleep., Disp: 60 tablet, Rfl: 2  Allergies  Allergen Reactions   Amoxicillin Other (See Comments)    Joint pains    I personally reviewed active problem list, medication list, allergies, lab results with the patient/caregiver today.   Review of Systems  Musculoskeletal:  Positive for  back pain (neck, midback and lower back pain) and joint pain (left shoulder). Negative for falls.  Neurological:  Negative for dizziness and headaches.      Objective  Vitals:   04/27/22 1446  BP: 107/73  Pulse: (!) 102  Temp: (!) 97.4 F (36.3 C)  TempSrc: Oral  SpO2: 92%  Weight: 172 lb 14.4 oz (78.4 kg)  Height: 5' 2.13" (1.578 m)    Body mass index is 31.5 kg/m.  Physical Exam Vitals reviewed.  Constitutional:      General: She is awake.     Appearance: She is well-developed. She is obese.     Comments: Appears mildly disheveled   HENT:     Head: Normocephalic and atraumatic.  Eyes:     General: Gaze  aligned appropriately.     Extraocular Movements: Extraocular movements intact.     Conjunctiva/sclera: Conjunctivae normal.  Pulmonary:     Effort: Pulmonary effort is normal.  Musculoskeletal:     Right shoulder: No deformity, effusion, tenderness or bony tenderness. Normal range of motion.     Left shoulder: Tenderness present. No deformity, effusion or bony tenderness. Normal range of motion.     Cervical back: Normal range of motion and neck supple. No swelling or bony tenderness. No pain with movement, spinous process tenderness or muscular tenderness. Normal range of motion.     Thoracic back: No tenderness or bony tenderness.     Comments: Strength 5/5 at shoulders Apley scratch normal Drop arm test: normal ROM at shoulders: abduction, adduction, extension, internal and external rotation all normal with mild discomfort during extension   Neurological:     Mental Status: She is alert.  Psychiatric:        Attention and Perception: Attention normal.        Mood and Affect: Mood normal.        Behavior: Behavior normal. Behavior is cooperative.        Cognition and Memory: Cognition is impaired.      Recent Results (from the past 2160 hour(s))  Urinalysis, Routine w reflex microscopic     Status: Abnormal   Collection Time: 02/04/22 11:10 AM  Result  Value Ref Range   Specific Gravity, UA 1.020 1.005 - 1.030   pH, UA 5.5 5.0 - 7.5   Color, UA Yellow Yellow   Appearance Ur Cloudy (A) Clear   Leukocytes,UA 1+ (A) Negative   Protein,UA Trace (A) Negative/Trace   Glucose, UA Negative Negative   Ketones, UA Negative Negative   RBC, UA Trace (A) Negative   Bilirubin, UA Negative Negative   Urobilinogen, Ur 0.2 0.2 - 1.0 mg/dL   Nitrite, UA Negative Negative   Microscopic Examination See below:   Microalbumin, Urine Waived     Status: Abnormal   Collection Time: 02/04/22 11:10 AM  Result Value Ref Range   Microalb, Ur Waived 80 (H) 0 - 19 mg/L   Creatinine, Urine Waived 200 10 - 300 mg/dL   Microalb/Creat Ratio 30-300 (H) <30 mg/g    Comment:                              Abnormal:       30 - 300                         High Abnormal:           >300   Microscopic Examination     Status: Abnormal   Collection Time: 02/04/22 11:10 AM   Urine  Result Value Ref Range   WBC, UA 0-5 0 - 5 /hpf   RBC, Urine 0-2 0 - 2 /hpf   Epithelial Cells (non renal) 0-10 0 - 10 /hpf   Mucus, UA Present (A) Not Estab.   Bacteria, UA None seen None seen/Few  Comprehensive metabolic panel     Status: Abnormal   Collection Time: 02/04/22 11:50 AM  Result Value Ref Range   Glucose 85 70 - 99 mg/dL   BUN 30 (H) 8 - 27 mg/dL   Creatinine, Ser 0.96 0.57 - 1.00 mg/dL   eGFR 66 >59 mL/min/1.73   BUN/Creatinine Ratio 31 (H) 12 - 28   Sodium 142  134 - 144 mmol/L   Potassium 3.8 3.5 - 5.2 mmol/L   Chloride 97 96 - 106 mmol/L   CO2 22 20 - 29 mmol/L   Calcium 9.8 8.7 - 10.3 mg/dL   Total Protein 7.8 6.0 - 8.5 g/dL   Albumin 4.5 3.8 - 4.8 g/dL   Globulin, Total 3.3 1.5 - 4.5 g/dL   Albumin/Globulin Ratio 1.4 1.2 - 2.2   Bilirubin Total 0.7 0.0 - 1.2 mg/dL   Alkaline Phosphatase 77 44 - 121 IU/L   AST 23 0 - 40 IU/L   ALT 28 0 - 32 IU/L  Lipid Panel w/o Chol/HDL Ratio     Status: Abnormal   Collection Time: 02/04/22 11:50 AM  Result Value Ref Range    Cholesterol, Total 189 100 - 199 mg/dL   Triglycerides 160 (H) 0 - 149 mg/dL   HDL 57 >39 mg/dL   VLDL Cholesterol Cal 28 5 - 40 mg/dL   LDL Chol Calc (NIH) 104 (H) 0 - 99 mg/dL  CBC with Differential/Platelet     Status: Abnormal   Collection Time: 02/04/22 11:50 AM  Result Value Ref Range   WBC 6.0 3.4 - 10.8 x10E3/uL   RBC 5.42 (H) 3.77 - 5.28 x10E6/uL   Hemoglobin 14.5 11.1 - 15.9 g/dL   Hematocrit 45.0 34.0 - 46.6 %   MCV 83 79 - 97 fL   MCH 26.8 26.6 - 33.0 pg   MCHC 32.2 31.5 - 35.7 g/dL   RDW 15.5 (H) 11.7 - 15.4 %   Platelets 164 150 - 450 x10E3/uL   Neutrophils 77 Not Estab. %   Lymphs 15 Not Estab. %   Monocytes 4 Not Estab. %   Eos 1 Not Estab. %   Basos 1 Not Estab. %   Neutrophils Absolute 4.7 1.4 - 7.0 x10E3/uL   Lymphocytes Absolute 0.9 0.7 - 3.1 x10E3/uL   Monocytes Absolute 0.2 0.1 - 0.9 x10E3/uL   EOS (ABSOLUTE) 0.1 0.0 - 0.4 x10E3/uL   Basophils Absolute 0.0 0.0 - 0.2 x10E3/uL   Immature Granulocytes 2 Not Estab. %   Immature Grans (Abs) 0.1 0.0 - 0.1 x10E3/uL  TSH     Status: None   Collection Time: 02/04/22 11:50 AM  Result Value Ref Range   TSH 1.170 0.450 - 4.500 uIU/mL  Urine Culture     Status: None   Collection Time: 02/04/22  1:40 PM   Specimen: Urine   UR  Result Value Ref Range   Urine Culture, Routine Final report    Organism ID, Bacteria Comment     Comment: Mixed urogenital flora 10,000-25,000 colony forming units per mL   Urinalysis, Routine w reflex microscopic     Status: Abnormal   Collection Time: 03/10/22 11:22 AM  Result Value Ref Range   Specific Gravity, UA 1.015 1.005 - 1.030   pH, UA 6.0 5.0 - 7.5   Color, UA Yellow Yellow   Appearance Ur Clear Clear   Leukocytes,UA Trace (A) Negative   Protein,UA Negative Negative/Trace   Glucose, UA Negative Negative   Ketones, UA Negative Negative   RBC, UA Negative Negative   Bilirubin, UA Negative Negative   Urobilinogen, Ur 0.2 0.2 - 1.0 mg/dL   Nitrite, UA Negative Negative    Microscopic Examination See below:   Microscopic Examination     Status: None   Collection Time: 03/10/22 11:22 AM   Urine  Result Value Ref Range   WBC, UA 0-5 0 - 5 /hpf  RBC, Urine 0-2 0 - 2 /hpf   Epithelial Cells (non renal) 0-10 0 - 10 /hpf   Bacteria, UA None seen None seen/Few     PHQ2/9:    02/04/2022   10:59 AM 01/29/2022   11:18 AM 11/24/2021   11:25 AM 11/09/2021   10:02 AM 11/07/2020    9:48 AM  Depression screen PHQ 2/9  Decreased Interest 0 0 1 0 0  Down, Depressed, Hopeless 0 0 1 0 0  PHQ - 2 Score 0 0 2 0 0  Altered sleeping 1 1 1     Tired, decreased energy 1 1 1     Change in appetite 0 0 0    Feeling bad or failure about yourself  0 0 0    Trouble concentrating 0 0 0    Moving slowly or fidgety/restless 0 0 0    Suicidal thoughts 0 0 0    PHQ-9 Score 2 2 4     Difficult doing work/chores Not difficult at all          Fall Risk:    01/29/2022   11:18 AM 11/24/2021   11:24 AM 11/09/2021   10:04 AM 11/07/2020    9:47 AM 10/31/2019   11:08 AM  Fall Risk   Falls in the past year? 0 0 0 1 0  Number falls in past yr: 0 0 0  0  Injury with Fall? 0 0 0  0  Risk for fall due to : No Fall Risks No Fall Risks  Medication side effect   Follow up Falls evaluation completed Falls evaluation completed Falls evaluation completed;Falls prevention discussed Falls evaluation completed;Education provided;Falls prevention discussed       Functional Status Survey:      Assessment & Plan  Problem List Items Addressed This Visit   None Visit Diagnoses     Acute pain of left shoulder    -  Primary Acute, new problem Reports she thinks she lifted something heavy when this first started and is having intermittent, recurrent pain in her left shoulder, left side of neck and thoracic back Reports that bending forward exacerbates pain ROM is intact bilaterally at shoulders, negative drop arm and Apley scratch testing  Suspect this may be muscular in nature  Provided  reassurance and recommendations for lifting things to reduce back-injury risks Recommend she use gentle stretching, massage and warm compresses along with Meloxicam and Tylenol for pain. GFR is >60 per review Will provide Meloxicam 7.5 mg PO QD to assist with pain rather than having her take multiple doses of Ibuprofen Follow up as needed. If not improving in 3-4 weeks with conservative measures, can discuss PT referral.    Relevant Medications   meloxicam (MOBIC) 7.5 MG tablet        No follow-ups on file.   I, Makhia Vosler E Makinzi Prieur, PA-C, have reviewed all documentation for this visit. The documentation on 04/27/22 for the exam, diagnosis, procedures, and orders are all accurate and complete.   Talitha Givens, MHS, PA-C [facility]

## 2022-05-03 ENCOUNTER — Telehealth: Payer: Self-pay

## 2022-05-03 NOTE — Telephone Encounter (Signed)
Copied from [place] [id number]. Topic: General - Inquiry >> May 03, 2022  2:07 PM Marcellus Scott wrote: Reason for CRM: Pt stated she came into office last week, 08/01 and gave the paperwork (Re-survey to be able to ride the bus to her appointments) to [employer], who stated she would give paperwork to [facility]. Pt needs to know if paperwork is ready and has been mailed back to her.   Pt stated she was advised they would mail it back to her, and she hasn't received anything.  Please advise.

## 2022-05-04 NOTE — Telephone Encounter (Signed)
Returned patient call and made her aware provider has forms to fill out, will mail back to patient when complete.

## 2022-05-10 ENCOUNTER — Telehealth: Payer: Self-pay

## 2022-05-10 NOTE — Telephone Encounter (Signed)
Copied from [place] [id number]. Topic: General - Other >> May 10, 2022 12:14 PM Everette C wrote: Reason for CRM: The patient has called for an update on the completion of their transportation paperwork   Please contact further when possible

## 2022-05-11 NOTE — Telephone Encounter (Signed)
Attempted to contact patient, NA LVM advising patient form will be mailed back to her.

## 2022-05-12 ENCOUNTER — Inpatient Hospital Stay: Payer: Medicare Other

## 2022-05-12 ENCOUNTER — Inpatient Hospital Stay: Payer: Medicare Other | Admitting: Internal Medicine

## 2022-06-07 NOTE — Progress Notes (Unsigned)
06/08/2022 4:18 PM   Kristina Henson Dec 27, 1955 [id number]  Referring provider: Valerie Roys, DO [place],  [place] [address]  Urological history: 1. rUTI -Contributing factors of age, vaginal atrophy and poor perineal hygiene -documented positive urine cultures over the last year  06/12/2021 lactobacillus species  06/24/2021 lactobacillus species  07/07/2021 lactobacillus species  08/25/2021 lactobacillus species  11/02/2021 multiple species present   2. 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  - CYSTITIS CYSTICA AND CYSTITIS GLANDULARIS, IN ALL SAMPLES. - NON-KERATINIZING SQUAMOUS METAPLASIA, MULTIPLE FOCI.  - NEGATIVE FOR MALIGNANCY -no reports of gross heme -UA > 30 WBC's and many bacteria   Chief Complaint  Patient presents with   Hematuria   Recurrent UTI    HPI: Kristina Henson is a 66 y.o. female who presents today for a 6 month follow up.     On OAB questionnaire, is 1-7 daytime urinations, 3 or more nighttime urinations and a mild urge to urinate.  She denies any urinary leakage.  She does limit fluid intake from time to time to avoid using the restroom.  She does engage in toilet mapping.  Patient denies any modifying or aggravating factors.  Patient denies any gross hematuria, dysuria or suprapubic/flank pain.  Patient denies any fevers, chills, nausea or vomiting.    A week ago, her urine smelled funny so she took 2 leftover pills of Cipro and it abated.  Her main  concern today is her inability to fall asleep.  She is following up with her PCP regarding this.  UA > 30 WBC's and many bacteria.    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   Atrial fibrillation ([place])    a.) CHA2DS2-VASc = 5 (sex, HTN, CVA x 2, aortic plaque). b.) s/p ablation in 2008. c.) rate/rhythm maintained on oral amiodarone; not currently on daily anticoagulation.   Coronary artery disease    Duodenitis    Gastritis    HLD (hyperlipidemia)    Hypertension    IDA (iron deficiency anemia)    LBBB (left bundle branch block)    Mass of left submandibular region 04/29/2016   a.) FNA 04/29/2016 --> Bx (+) for pleomorphic adenoma; s/p resection   PAH (pulmonary artery hypertension) ([place]) 07/24/2020   a.) CT 07/24/2020 showed enlarged pulmonary artery consistent with PAH.   Pneumonia 2016   Sleep apnea    Stroke [facility]) 2012    Surgical History: Past Surgical History:  Procedure Laterality Date   BIOPSY SALIVARY GLAND Left 04/29/2016   Procedure: SUBMANDIBULAR GLAND BIOPSY (FNA); Location: [place]; Surgeon: Markus Daft, MD (IR)   cardiac catherization     CARDIAC ELECTROPHYSIOLOGY STUDY AND ABLATION N/A 2008   Procedure: ATRIAL FIBRILLATION ABLATION; Location: [facility]   COLONOSCOPY WITH PROPOFOL N/A 11/18/2015  Procedure: COLONOSCOPY WITH PROPOFOL;  Surgeon: Lucilla Lame, MD;  Location: [facility] ENDOSCOPY;  Service: Endoscopy;  Laterality: N/A;   COLONOSCOPY WITH PROPOFOL N/A 12/02/2020   Procedure: COLONOSCOPY WITH PROPOFOL;  Surgeon: Lucilla Lame, MD;  Location: [facility] ENDOSCOPY;  Service: Endoscopy;  Laterality: N/A;   CORONARY ANGIOPLASTY     CYSTOSCOPY WITH BIOPSY N/A 11/30/2021   Procedure: CYSTOSCOPY WITH BLADDER BIOPSY;  Surgeon: Hollice Espy, MD;  Location: [facility] ORS;  Service:  Urology;  Laterality: N/A;   ESOPHAGOGASTRODUODENOSCOPY (EGD) WITH PROPOFOL N/A 04/23/2016   Procedure: ESOPHAGOGASTRODUODENOSCOPY (EGD) WITH PROPOFOL;  Surgeon: Lucilla Lame, MD;  Location: [place];  Service: Endoscopy;  Laterality: N/A;  small bowel bx gastric antrum bx   SUBMANDIBULAR GLAND EXCISION Left 09/08/2017   Procedure: EXCISION SUBMANDIBULAR GLAND;  Surgeon: Carloyn Manner, MD;  Location: [facility] ORS;  Service: ENT;  Laterality: Left;   TOTAL ABDOMINAL HYSTERECTOMY W/ BILATERAL SALPINGOOPHORECTOMY     Total    Home Medications:  Allergies as of 06/08/2022       Reactions   Amoxicillin Other (See Comments)   Joint pains        Medication List        Accurate as of June 08, 2022  4:18 PM. If you have any questions, ask your nurse or doctor.          albuterol 108 (90 Base) MCG/ACT inhaler Commonly known as: VENTOLIN HFA Inhale 2 puffs into the lungs every 6 (six) hours as needed for wheezing or shortness of breath.   amiodarone 200 MG tablet Commonly known as: PACERONE Take 1 tablet (200 mg total) by mouth every morning.   amLODipine 2.5 MG tablet Commonly known as: NORVASC Take 1 tablet (2.5 mg total) by mouth daily.   aspirin EC 81 MG tablet Take 81 mg by mouth daily.   baclofen 10 MG tablet Commonly known as: LIORESAL Take 1 tablet (10 mg total) by mouth at bedtime as needed for muscle spasms.   Breztri Aerosphere 160-9-4.8 MCG/ACT Aero Generic drug: Budeson-Glycopyrrol-Formoterol Inhale 2 puffs into the lungs 2 (two) times daily.   chlorhexidine 0.12 % solution Commonly known as: PERIDEX 7.5 mLs by Mouth Rinse route every evening.   ferrous sulfate 325 (65 FE) MG EC tablet Take 1 tablet (325 mg total) by mouth 3 (three) times daily with meals. What changed: when to take this   hydrALAZINE 50 MG tablet Commonly known as: APRESOLINE Take 1 tablet (50 mg total) by mouth 2 (two) times daily.   lisinopril-hydrochlorothiazide  20-25 MG tablet Commonly known as: ZESTORETIC Take 1 tablet by mouth 2 (two) times daily.   meloxicam 7.5 MG tablet Commonly known as: MOBIC Take 1 tablet (7.5 mg total) by mouth daily.   traZODone 50 MG tablet Commonly known as: DESYREL Take 1-2 tablets (50-100 mg total) by mouth at bedtime as needed for sleep.        Allergies:  Allergies  Allergen Reactions   Amoxicillin Other (See Comments)    Joint pains    Family History: Family History  Problem Relation Age of Onset   Arthritis Mother    Cancer Mother    Hypertension Sister    Asthma Brother    Dementia Brother    Breast cancer Neg Hx    Kidney cancer Neg Hx    Bladder Cancer Neg Hx     Social History:  reports that she quit smoking about 45 years ago. Her smoking use included cigarettes. She has never used smokeless  tobacco. She reports current alcohol use of about 1.0 standard drink of alcohol per week. She reports that she does not use drugs.  ROS: Pertinent ROS in HPI  Physical Exam: BP 131/83   Pulse (!) 111   Ht '5\' 2"'$  (1.575 m)   Wt 170 lb (77.1 kg)   BMI 31.09 kg/m   Constitutional:  Well nourished. Alert and oriented, No acute distress. HEENT: [place] AT, moist mucus membranes.  Trachea midline Cardiovascular: No clubbing, cyanosis, or edema. Respiratory: Normal respiratory effort, no increased work of breathing. eurologic: Grossly intact, no focal deficits, moving all 4 extremities. Psychiatric: Normal mood and affect.    Laboratory Data: Lab Results  Component Value Date   WBC 6.0 02/04/2022   HGB 14.5 02/04/2022   HCT 45.0 02/04/2022   MCV 83 02/04/2022   PLT 164 02/04/2022    Lab Results  Component Value Date   CREATININE 0.96 02/04/2022       Component Value Date/Time   CHOL 189 02/04/2022 1150   HDL 57 02/04/2022 1150   LDLCALC 104 (H) 02/04/2022 1150    Lab Results  Component Value Date   AST 23 02/04/2022   Lab Results  Component Value Date   ALT 28 02/04/2022     Urinalysis Component     Latest Ref Rng 06/08/2022  Specific Gravity, UA     1.005 - 1.030  1.020   pH, UA     5.0 - 7.5  5.5   Color, UA     Yellow  Yellow   Appearance Ur     Clear  Clear   Leukocytes,UA     Negative  2+ !   Protein,UA     Negative/Trace  Negative   Glucose, UA     Negative  Negative   Ketones, UA     Negative  Negative   RBC, UA     Negative  Negative   Bilirubin, UA     Negative  Negative   Urobilinogen, Ur     0.2 - 1.0 mg/dL 0.2   Nitrite, UA     Negative  Negative   Microscopic Examination See below:     Legend: ! Abnormal  Component     Latest Ref Rng 06/08/2022  WBC, UA     0 - 5 /hpf >30 !   Epithelial Cells (non renal)     0 - 10 /hpf 0-10   Bacteria, UA     None seen/Few  Many !   RBC, Urine     0 - 2 /hpf 0-2     Legend: ! Abnormal I have reviewed the labs.   Pertinent Imaging:  06/08/22 11:26  Scan Result 161m      Assessment & Plan:    1. High risk hematuria -work up in 2022 (CTU, cysto, urine cytology and bladder biopsy) benign -no reports of gross heme -UA negative for micro heme  2. rUTI's -asymptomatic at this visit -UA with pyuria/bacteruria  -Urine sent for culture, but will not treat at this time unless she develops symptoms of a UTI    Return in about 1 year (around 06/09/2023) for PVR, UA and OAB questionnaire.  These notes generated with voice recognition software. I apologize for typographical errors.  [place] [address] [place] [phone]

## 2022-06-08 ENCOUNTER — Encounter: Payer: Self-pay | Admitting: Urology

## 2022-06-08 ENCOUNTER — Ambulatory Visit (INDEPENDENT_AMBULATORY_CARE_PROVIDER_SITE_OTHER): Payer: Medicare Other | Admitting: Urology

## 2022-06-08 VITALS — BP 131/83 | HR 111 | Ht 62.0 in | Wt 170.0 lb

## 2022-06-08 DIAGNOSIS — Z87440 Personal history of urinary (tract) infections: Secondary | ICD-10-CM

## 2022-06-08 DIAGNOSIS — N390 Urinary tract infection, site not specified: Secondary | ICD-10-CM

## 2022-06-08 DIAGNOSIS — R319 Hematuria, unspecified: Secondary | ICD-10-CM

## 2022-06-08 LAB — URINALYSIS, COMPLETE
Bilirubin, UA: NEGATIVE
Glucose, UA: NEGATIVE
Ketones, UA: NEGATIVE
Nitrite, UA: NEGATIVE
Protein,UA: NEGATIVE
RBC, UA: NEGATIVE
Specific Gravity, UA: 1.02 (ref 1.005–1.030)
Urobilinogen, Ur: 0.2 mg/dL (ref 0.2–1.0)
pH, UA: 5.5 (ref 5.0–7.5)

## 2022-06-08 LAB — MICROSCOPIC EXAMINATION: WBC, UA: >30 /hpf — AB (ref 0–5)

## 2022-06-08 LAB — BLADDER SCAN AMB NON-IMAGING

## 2022-06-09 DIAGNOSIS — G4733 Obstructive sleep apnea (adult) (pediatric): Secondary | ICD-10-CM | POA: Diagnosis not present

## 2022-06-15 LAB — CULTURE, URINE COMPREHENSIVE

## 2022-06-16 ENCOUNTER — Telehealth: Payer: Self-pay

## 2022-06-16 NOTE — Telephone Encounter (Signed)
LMOM for pt to return call. 

## 2022-06-16 NOTE — Telephone Encounter (Signed)
Pt states she is not experiencing symptoms at this time.

## 2022-06-16 NOTE — Telephone Encounter (Signed)
-----   Message from Nori Riis, PA-C sent at 06/15/2022  8:35 PM EDT ----- Please let Kristina Henson know that her urine culture grew out bacteria.  If she is not experiencing any UTI symptoms at this time, I do not recommend any antibiotics as she is likely colonized.  If she is having symptoms, we will need to start an antibiotic.

## 2022-06-21 MED FILL — Iron Sucrose Inj 20 MG/ML (Fe Equiv): INTRAVENOUS | Qty: 10 | Status: AC

## 2022-06-22 ENCOUNTER — Inpatient Hospital Stay: Payer: Medicare Other | Attending: Internal Medicine | Admitting: Internal Medicine

## 2022-06-22 ENCOUNTER — Inpatient Hospital Stay: Payer: Medicare Other

## 2022-06-22 ENCOUNTER — Encounter: Payer: Self-pay | Admitting: Internal Medicine

## 2022-06-22 DIAGNOSIS — D696 Thrombocytopenia, unspecified: Secondary | ICD-10-CM | POA: Insufficient documentation

## 2022-06-22 DIAGNOSIS — D509 Iron deficiency anemia, unspecified: Secondary | ICD-10-CM | POA: Insufficient documentation

## 2022-06-22 DIAGNOSIS — Z87891 Personal history of nicotine dependence: Secondary | ICD-10-CM | POA: Diagnosis not present

## 2022-06-22 DIAGNOSIS — D638 Anemia in other chronic diseases classified elsewhere: Secondary | ICD-10-CM | POA: Diagnosis not present

## 2022-06-22 LAB — CBC WITH DIFFERENTIAL/PLATELET
Abs Immature Granulocytes: 0.02 10*3/uL (ref 0.00–0.07)
Basophils Absolute: 0 10*3/uL (ref 0.0–0.1)
Basophils Relative: 1 %
Eosinophils Absolute: 0 10*3/uL (ref 0.0–0.5)
Eosinophils Relative: 1 %
HCT: 37.9 % (ref 36.0–46.0)
Hemoglobin: 12.5 g/dL (ref 12.0–15.0)
Immature Granulocytes: 1 %
Lymphocytes Relative: 23 %
Lymphs Abs: 0.8 10*3/uL (ref 0.7–4.0)
MCH: 27.5 pg (ref 26.0–34.0)
MCHC: 33 g/dL (ref 30.0–36.0)
MCV: 83.5 fL (ref 80.0–100.0)
Monocytes Absolute: 0.3 10*3/uL (ref 0.1–1.0)
Monocytes Relative: 8 %
Neutro Abs: 2.3 10*3/uL (ref 1.7–7.7)
Neutrophils Relative %: 66 %
Platelets: 128 10*3/uL — ABNORMAL LOW (ref 150–400)
RBC: 4.54 MIL/uL (ref 3.87–5.11)
RDW: 16.6 % — ABNORMAL HIGH (ref 11.5–15.5)
WBC: 3.5 10*3/uL — ABNORMAL LOW (ref 4.0–10.5)
nRBC: 0 % (ref 0.0–0.2)

## 2022-06-22 LAB — BASIC METABOLIC PANEL
Anion gap: 5 (ref 5–15)
BUN: 24 mg/dL — ABNORMAL HIGH (ref 8–23)
CO2: 28 mmol/L (ref 22–32)
Calcium: 9.2 mg/dL (ref 8.9–10.3)
Chloride: 104 mmol/L (ref 98–111)
Creatinine, Ser: 0.93 mg/dL (ref 0.44–1.00)
GFR, Estimated: >60 mL/min (ref >60)
Glucose, Bld: 103 mg/dL — ABNORMAL HIGH (ref 70–99)
Potassium: 3.8 mmol/L (ref 3.5–5.1)
Sodium: 137 mmol/L (ref 135–145)

## 2022-06-22 LAB — IRON AND TIBC
Iron: 54 ug/dL (ref 28–170)
Saturation Ratios: 16 % (ref 10.4–31.8)
TIBC: 336 ug/dL (ref 250–450)
UIBC: 282 ug/dL

## 2022-06-22 LAB — FERRITIN: Ferritin: 83 ng/mL (ref 11–307)

## 2022-06-22 NOTE — Progress Notes (Signed)
Pt in for follow up, denies any concerns. States she is taking iron supplement once a day.

## 2022-06-22 NOTE — Assessment & Plan Note (Addendum)
#   Anemia- likely IDA vs anemia of chronic disease  [unclear etiology]- s/p IV iron; improved. Hb ~12.5; [ferritin-240s; iron sat-12%-Feb 1st 2023]; continue PO iron once a day.  Hold any IV iron. STABLE  #Mild asymptomatic leukopenia/intermittent neutropenia-?  Autoimmune process.  STABLE  #Mild intermittent thrombocytopenia-today 120 Stable. ? Autoimmune process.  STABLE  # DISPOSITION:  # no infusions today # follow up in 6 months-MD/cbc/bmp/iron studies/ferritin; possible Venofer-- Dr.B

## 2022-06-22 NOTE — Progress Notes (Signed)
[facility] CONSULT NOTE  Patient Care Team: Valerie Roys, DO as PCP - General (Family Medicine) Ubaldo Glassing Javier Docker, MD as Consulting Physician (Cardiology) Carloyn Manner, MD as Referring Physician (Otolaryngology) Minor, Dalbert Garnet, RN (Inactive) as [facility] Rogue Bussing, Elisha Headland, MD as Consulting Physician (Hematology and Oncology)  CHIEF COMPLAINTS/PURPOSE OF CONSULTATION:   # Anemia of chronic disease/mild intermittent leucopenia/thromboctyopenia? Autoimmune- EGD/ [place] [Dr.Wohl; 2017] chronic back pain [? 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   HISTORY OF PRESENTING ILLNESS: Patient is alone.  Ambulating independently.  Kristina Henson 66 y.o.  female mild to moderate anemia/intermittent thrombocytopenia/leukopenia of Unclear etiology- is here for follow-up.  No blood in stools or black or stools.  She continues to be on p.o. iron once a day.   Review of Systems  Constitutional:  Positive for malaise/fatigue. Negative for chills, diaphoresis, fever and weight loss.  HENT:  Negative for nosebleeds and sore throat.   Eyes:  Negative for double vision.  Respiratory:  Negative for cough, hemoptysis, sputum production, shortness of breath and wheezing.   Cardiovascular:  Negative for chest pain, palpitations, orthopnea and leg swelling.  Gastrointestinal:  Negative for abdominal pain, blood in stool, constipation, diarrhea, heartburn, melena, nausea and vomiting.  Genitourinary:  Negative for dysuria, frequency and urgency.  Musculoskeletal:  Positive for joint pain. Negative for back pain.  Skin: Negative.  Negative for itching and rash.  Neurological:  Negative for dizziness, tingling, focal weakness,  weakness and headaches.  Endo/Heme/Allergies:  Does not bruise/bleed easily.  Psychiatric/Behavioral:  Negative for depression. The patient is not nervous/anxious and does not have insomnia.      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   Atrial fibrillation ([place])    a.) CHA2DS2-VASc = 5 (sex, HTN, CVA x 2, aortic plaque). b.) s/p ablation in 2008. c.) rate/rhythm maintained on oral amiodarone; not currently on daily anticoagulation.   Coronary artery disease    Duodenitis    Gastritis    HLD (hyperlipidemia)    Hypertension    IDA (iron deficiency anemia)    LBBB (left bundle branch block)    Mass of left submandibular region 04/29/2016   a.) FNA 04/29/2016 --> Bx (+) for pleomorphic adenoma; s/p resection   PAH (pulmonary artery hypertension) ([place]) 07/24/2020   a.) CT 07/24/2020 showed enlarged pulmonary artery consistent with PAH.   Pneumonia 2016   Sleep apnea    Stroke [facility]) 2012    SURGICAL HISTORY: Past Surgical History:  Procedure Laterality Date   BIOPSY SALIVARY GLAND Left 04/29/2016   Procedure: SUBMANDIBULAR GLAND BIOPSY (FNA); Location: [place]; Surgeon: Markus Daft, MD (IR)   cardiac catherization     CARDIAC ELECTROPHYSIOLOGY STUDY AND ABLATION N/A 2008   Procedure: ATRIAL FIBRILLATION ABLATION; Location: [facility]   COLONOSCOPY WITH PROPOFOL N/A 11/18/2015   Procedure: COLONOSCOPY WITH PROPOFOL;  Surgeon: Lucilla Lame, MD;  Location: [facility] ENDOSCOPY;  Service: Endoscopy;  Laterality: N/A;   COLONOSCOPY WITH PROPOFOL N/A 12/02/2020   Procedure: COLONOSCOPY WITH PROPOFOL;  Surgeon: Lucilla Lame, MD;  Location: [facility]  ENDOSCOPY;  Service: Endoscopy;  Laterality: N/A;   CORONARY ANGIOPLASTY     CYSTOSCOPY WITH BIOPSY N/A 11/30/2021   Procedure: CYSTOSCOPY WITH BLADDER BIOPSY;   Surgeon: Hollice Espy, MD;  Location: [facility] ORS;  Service: Urology;  Laterality: N/A;   ESOPHAGOGASTRODUODENOSCOPY (EGD) WITH PROPOFOL N/A 04/23/2016   Procedure: ESOPHAGOGASTRODUODENOSCOPY (EGD) WITH PROPOFOL;  Surgeon: Lucilla Lame, MD;  Location: [place];  Service: Endoscopy;  Laterality: N/A;  small bowel bx gastric antrum bx   SUBMANDIBULAR GLAND EXCISION Left 09/08/2017   Procedure: EXCISION SUBMANDIBULAR GLAND;  Surgeon: Carloyn Manner, MD;  Location: [facility] ORS;  Service: ENT;  Laterality: Left;   TOTAL ABDOMINAL HYSTERECTOMY W/ BILATERAL SALPINGOOPHORECTOMY     Total    SOCIAL HISTORY: Social History   Socioeconomic History   Marital status: Divorced    Spouse name: Not on file   Number of children: Not on file   Years of education: 12   Highest education level: 12th grade  Occupational History   Occupation: disability   Tobacco Use   Smoking status: Former    Types: Cigarettes    Quit date: 1978    Years since quitting: 45.7   Smokeless tobacco: Never   Tobacco comments:    didnt smoke much   Vaping Use   Vaping Use: Never used  Substance and Sexual Activity   Alcohol use: Yes    Alcohol/week: 1.0 standard drink of alcohol    Types: 1 Glasses of wine per week    Comment: occasionally   Drug use: No   Sexual activity: Not Currently  Other Topics Concern   Not on file  Social History Narrative   Not on file   Social Determinants of Health   Financial Resource Strain: Low Risk  (11/09/2021)   Overall Financial Resource Strain (CARDIA)    Difficulty of Paying Living Expenses: Not hard at all  Food Insecurity: No Food Insecurity (11/09/2021)   Hunger Vital Sign    Worried About Running Out of Food in the Last Year: Never true    Ran Out of Food in the Last Year: Never true  Transportation Needs: No Transportation Needs (11/09/2021)   PRAPARE - Hydrologist (Medical): No    Lack of Transportation (Non-Medical): No   Physical Activity: Inactive (11/09/2021)   Exercise Vital Sign    Days of Exercise per Week: 0 days    Minutes of Exercise per Session: 0 min  Stress: No Stress Concern Present (11/09/2021)   [place]    Feeling of Stress : Not at all  Social Connections: Moderately Isolated (11/09/2021)   Social Connection and Isolation Panel [NHANES]    Frequency of Communication with Friends and Family: Twice a week    Frequency of Social Gatherings with Friends and Family: Once a week    Attends Religious Services: 1 to 4 times per year    Active Member of [employer] or Organizations: No    Attends Archivist Meetings: Not on file    Marital Status: Divorced  Intimate Partner Violence: Not At Risk (11/09/2021)   Humiliation, Afraid, Rape, and Kick questionnaire    Fear of Current or Ex-Partner: No    Emotionally Abused: No    Physically Abused: No    Sexually Abused: No    FAMILY HISTORY: Family History  Problem Relation Age of Onset   Arthritis Mother    Cancer Mother    Hypertension Sister  Asthma Brother    Dementia Brother    Breast cancer Neg Hx    Kidney cancer Neg Hx    Bladder Cancer Neg Hx     ALLERGIES:  is allergic to amoxicillin.  MEDICATIONS:  Current Outpatient Medications  Medication Sig Dispense Refill   albuterol (VENTOLIN HFA) 108 (90 Base) MCG/ACT inhaler Inhale 2 puffs into the lungs every 6 (six) hours as needed for wheezing or shortness of breath. 8 g 0   amiodarone (PACERONE) 200 MG tablet Take 1 tablet (200 mg total) by mouth every morning. 30 tablet 0   amLODipine (NORVASC) 2.5 MG tablet Take 1 tablet (2.5 mg total) by mouth daily. 90 tablet 1   aspirin EC 81 MG tablet Take 81 mg by mouth daily.      baclofen (LIORESAL) 10 MG tablet Take 1 tablet (10 mg total) by mouth at bedtime as needed for muscle spasms. 30 each 3   Budeson-Glycopyrrol-Formoterol (BREZTRI AEROSPHERE) 160-9-4.8  MCG/ACT AERO Inhale 2 puffs into the lungs 2 (two) times daily. 10.7 g 11   chlorhexidine (PERIDEX) 0.12 % solution 7.5 mLs by Mouth Rinse route every evening.     ferrous sulfate 325 (65 FE) MG EC tablet Take 1 tablet (325 mg total) by mouth 3 (three) times daily with meals. (Patient taking differently: Take 325 mg by mouth daily with breakfast.) 270 tablet 1   hydrALAZINE (APRESOLINE) 50 MG tablet Take 1 tablet (50 mg total) by mouth 2 (two) times daily. 180 tablet 1   lisinopril-hydrochlorothiazide (ZESTORETIC) 20-25 MG tablet Take 1 tablet by mouth 2 (two) times daily. 180 tablet 1   meloxicam (MOBIC) 7.5 MG tablet Take 1 tablet (7.5 mg total) by mouth daily. 30 tablet 0   traZODone (DESYREL) 50 MG tablet Take 1-2 tablets (50-100 mg total) by mouth at bedtime as needed for sleep. 60 tablet 2   No current facility-administered medications for this visit.      Marland Kitchen  PHYSICAL EXAMINATION: ECOG PERFORMANCE STATUS: 1 - Symptomatic but completely ambulatory  Vitals:   06/22/22 1449  BP: (!) 136/90  Pulse: 96  Resp: 16  Temp: (!) 96.8 F (36 C)  SpO2: 97%   Filed Weights   06/22/22 1449  Weight: 169 lb (76.7 kg)    Physical Exam HENT:     Head: Normocephalic and atraumatic.     Mouth/Throat:     Pharynx: No oropharyngeal exudate.  Eyes:     Pupils: Pupils are equal, round, and reactive to light.  Cardiovascular:     Rate and Rhythm: Normal rate and regular rhythm.  Pulmonary:     Effort: No respiratory distress.     Breath sounds: No wheezing.  Abdominal:     General: Bowel sounds are normal. There is no distension.     Palpations: Abdomen is soft. There is no mass.     Tenderness: There is no abdominal tenderness. There is no guarding or rebound.  Musculoskeletal:        General: No tenderness. Normal range of motion.     Cervical back: Normal range of motion and neck supple.  Skin:    General: Skin is warm.  Neurological:     Mental Status: She is alert and oriented  to person, place, and time.  Psychiatric:        Mood and Affect: Affect normal.      LABORATORY DATA:  I have reviewed the data as listed Lab Results  Component Value Date   WBC  3.5 (L) 06/22/2022   HGB 12.5 06/22/2022   HCT 37.9 06/22/2022   MCV 83.5 06/22/2022   PLT 128 (L) 06/22/2022   Recent Labs    08/07/21 1116 11/10/21 1243 02/04/22 1150 06/22/22 1416  NA 138 137 142 137  K 4.0 3.7 3.8 3.8  CL 100 102 97 104  CO2 '25 29 22 28  '$ GLUCOSE 97 117* 85 103*  BUN 23 26* 30* 24*  CREATININE 0.73 0.86 0.96 0.93  CALCIUM 9.6 9.3 9.8 9.2  GFRNONAA  --  >60  --  >60  PROT 7.2  --  7.8  --   ALBUMIN 4.2  --  4.5  --   AST 27  --  23  --   ALT 30  --  28  --   ALKPHOS 94  --  77  --   BILITOT 0.7  --  0.7  --     RADIOGRAPHIC STUDIES: I have personally reviewed the radiological images as listed and agreed with the findings in the report. No results found.   ASSESSMENT & PLAN:   Anemia due to chronic illness # Anemia- likely IDA vs anemia of chronic disease  [unclear etiology]- s/p IV iron; improved. Hb ~12.5; [ferritin-240s; iron sat-12%-Feb 1st 2023]; continue PO iron once a day.  Hold any IV iron. STABLE  #Mild asymptomatic leukopenia/intermittent neutropenia-?  Autoimmune process.  STABLE  #Mild intermittent thrombocytopenia-today 120 Stable. ? Autoimmune process.  STABLE  # DISPOSITION:  # no infusions today # follow up in 6 months-MD/cbc/bmp/iron studies/ferritin; possible Venofer-- Dr.B   All questions were answered. The patient knows to call the clinic with any problems, questions or concerns.     Cammie Sickle, MD 06/22/2022 3:33 PM

## 2022-07-11 DIAGNOSIS — Z23 Encounter for immunization: Secondary | ICD-10-CM | POA: Diagnosis not present

## 2022-07-20 ENCOUNTER — Encounter: Payer: Self-pay | Admitting: Physician Assistant

## 2022-07-20 ENCOUNTER — Ambulatory Visit (INDEPENDENT_AMBULATORY_CARE_PROVIDER_SITE_OTHER): Payer: Medicare Other | Admitting: Physician Assistant

## 2022-07-20 VITALS — BP 132/83 | HR 106 | Temp 98.4°F | Ht 62.01 in | Wt 166.8 lb

## 2022-07-20 DIAGNOSIS — J42 Unspecified chronic bronchitis: Secondary | ICD-10-CM | POA: Diagnosis not present

## 2022-07-20 DIAGNOSIS — R0989 Other specified symptoms and signs involving the circulatory and respiratory systems: Secondary | ICD-10-CM | POA: Insufficient documentation

## 2022-07-20 LAB — VERITOR FLU A/B WAIVED
Influenza A: NEGATIVE
Influenza B: NEGATIVE

## 2022-07-20 MED ORDER — PREDNISONE 20 MG PO TABS: 40.0000 mg | ORAL_TABLET | Freq: Every day | ORAL | 0 refills | Status: AC

## 2022-07-20 NOTE — Patient Instructions (Signed)
I recommend the following to help with your symptoms:  You can use you rescue inhaler when you are having chest tightness or a coughing spell up to 4 times per day  I recommend using Zyrtec or Allegra to help with the sinus congestion and pressure- take this once per day, every day for a few weeks  You can use Mucinex and drink plenty of water to help with the congestion as well.  I am going to send in a script for a Prednisone burst to help with the coughing

## 2022-07-20 NOTE — Assessment & Plan Note (Signed)
Chronic, historic condition She is concerned for URI- symptoms today and reports she is using her rescue inhaler 3-4 times per day, though she denies chest tightness or SOB  Recommend she use her rescue inhaler when she is having chest tightness or SOB to improve efficacy Will provide a Prednisone burst to assist with breathing while she is recovering from URI symptoms. Follow up as needed for progressing or persistent symptoms

## 2022-07-20 NOTE — Progress Notes (Signed)
Acute Office Visit   Patient: Kristina Henson   DOB: [birth year]   66 y.o. Female  MRN: [id number] Visit Date: 07/20/2022  Today's healthcare provider: Dani Gobble Thaddus Mcdowell, PA-C  Introduced myself to the patient as a Journalist, newspaper and provided education on APPs in clinical practice.    Chief Complaint  Patient presents with   Nasal Congestion    sneezing coughing, headache. All started a few day ago    Subjective    HPI HPI     Nasal Congestion    Additional comments: sneezing coughing, headache. All started a few day ago       Last edited by Jerelene Redden, CMA on 07/20/2022  2:38 PM.      States this started with runny nose and sneezing Started over the weekend  Reports it has progressed to include headaches and semi-productive cough She has taken Benadryl for this  She is unsure if she has been around sick contacts  She denies fevers, sinus pain or pressure, sore throat or GI upset  She has not tested herself for COVID  She has been using her inhalers as directed  She is using her rescue inhaler about 3 times per day but denies chest tightness or SOB  States she is coughing a lot and will need Tussionex cough syrup for relief as nothing else seems to work well    Medications: Outpatient Medications Prior to Visit  Medication Sig   albuterol (VENTOLIN HFA) 108 (90 Base) MCG/ACT inhaler Inhale 2 puffs into the lungs every 6 (six) hours as needed for wheezing or shortness of breath.   amiodarone (PACERONE) 200 MG tablet Take 1 tablet (200 mg total) by mouth every morning.   amLODipine (NORVASC) 2.5 MG tablet Take 1 tablet (2.5 mg total) by mouth daily.   aspirin EC 81 MG tablet Take 81 mg by mouth daily.    baclofen (LIORESAL) 10 MG tablet Take 1 tablet (10 mg total) by mouth at bedtime as needed for muscle spasms.   Budeson-Glycopyrrol-Formoterol (BREZTRI AEROSPHERE) 160-9-4.8 MCG/ACT AERO Inhale 2 puffs into the lungs 2 (two) times daily.   chlorhexidine (PERIDEX)  0.12 % solution 7.5 mLs by Mouth Rinse route every evening.   ferrous sulfate 325 (65 FE) MG EC tablet Take 1 tablet (325 mg total) by mouth 3 (three) times daily with meals. (Patient taking differently: Take 325 mg by mouth daily with breakfast.)   hydrALAZINE (APRESOLINE) 50 MG tablet Take 1 tablet (50 mg total) by mouth 2 (two) times daily.   lisinopril-hydrochlorothiazide (ZESTORETIC) 20-25 MG tablet Take 1 tablet by mouth 2 (two) times daily.   meloxicam (MOBIC) 7.5 MG tablet Take 1 tablet (7.5 mg total) by mouth daily.   traZODone (DESYREL) 50 MG tablet Take 1-2 tablets (50-100 mg total) by mouth at bedtime as needed for sleep.   No facility-administered medications prior to visit.    Review of Systems  Constitutional:  Positive for chills. Negative for fatigue and fever.  HENT:  Positive for congestion, rhinorrhea and sneezing. Negative for ear pain, postnasal drip, sinus pressure, sinus pain and sore throat.   Eyes:  Negative for pain, discharge and itching.  Respiratory:  Positive for cough. Negative for chest tightness, shortness of breath and wheezing.   Gastrointestinal:  Negative for diarrhea, nausea and vomiting.  Musculoskeletal:  Negative for myalgias.  Neurological:  Positive for headaches. Negative for dizziness and light-headedness.       Objective  BP 132/83   Pulse (!) 106   Temp 98.4 F (36.9 C) (Oral)   Ht 5' 2.01" (1.575 m)   Wt 166 lb 12.8 oz (75.7 kg)   SpO2 98%   BMI 30.50 kg/m    Physical Exam Vitals reviewed.  Constitutional:      General: She is awake.     Appearance: Normal appearance. She is well-developed and well-groomed.  HENT:     Head: Normocephalic and atraumatic.     Right Ear: Ear canal and external ear normal. There is impacted cerumen.     Left Ear: Ear canal and external ear normal. There is impacted cerumen.     Mouth/Throat:     Mouth: Mucous membranes are moist.     Pharynx: Oropharynx is clear. Uvula midline. No pharyngeal  swelling, oropharyngeal exudate, posterior oropharyngeal erythema or uvula swelling.  Cardiovascular:     Rate and Rhythm: Normal rate and regular rhythm.     Pulses: Normal pulses.          Radial pulses are 2+ on the right side and 2+ on the left side.     Heart sounds: Normal heart sounds. No murmur heard.    No friction rub. No gallop.  Pulmonary:     Effort: Pulmonary effort is normal.     Breath sounds: Normal breath sounds. No decreased air movement. No decreased breath sounds, wheezing, rhonchi or rales.  Neurological:     Mental Status: She is alert.  Psychiatric:        Attention and Perception: Attention and perception normal.        Mood and Affect: Mood and affect normal.        Speech: Speech normal.        Behavior: Behavior normal. Behavior is cooperative.       No results found for any visits on 07/20/22.  Assessment & Plan      No follow-ups on file.      Problem List Items Addressed This Visit       Respiratory   COPD (chronic obstructive pulmonary disease) ([place])    Chronic, historic condition She is concerned for URI- symptoms today and reports she is using her rescue inhaler 3-4 times per day, though she denies chest tightness or SOB  Recommend she use her rescue inhaler when she is having chest tightness or SOB to improve efficacy Will provide a Prednisone burst to assist with breathing while she is recovering from URI symptoms. Follow up as needed for progressing or persistent symptoms       Relevant Medications   predniSONE (DELTASONE) 20 MG tablet     Other   Symptoms of upper respiratory infection (URI) - Primary    Acute, new concern Reports symptoms started a few days ago but she is not able to provide a more definitive timeline Will test for COVID and Flu today Reviewed  use of Tussionex and informed patient that I do not typically treat acute coughs with this medication due to sedation and lack of efficacy in reducing cough Offered her a  Prednisone burst to assist with breathing and recommend using an antihistamine along with OTC Mucinex and cough medicine to assist with symptom management Results of viral testing to further dictate management Follow up as needed for persistent or progressing symptoms.       Relevant Medications   predniSONE (DELTASONE) 20 MG tablet   Other Relevant Orders   Novel Coronavirus, NAA ([facility])   Influenza a  and b     No follow-ups on file.   I, Kateleen Encarnacion E Taila Basinski, PA-C, have reviewed all documentation for this visit. The documentation on 07/20/22 for the exam, diagnosis, procedures, and orders are all accurate and complete.   Talitha Givens, MHS, PA-C [facility]

## 2022-07-20 NOTE — Assessment & Plan Note (Signed)
Acute, new concern Reports symptoms started a few days ago but she is not able to provide a more definitive timeline Will test for COVID and Flu today Reviewed  use of Tussionex and informed patient that I do not typically treat acute coughs with this medication due to sedation and lack of efficacy in reducing cough Offered her a Prednisone burst to assist with breathing and recommend using an antihistamine along with OTC Mucinex and cough medicine to assist with symptom management Results of viral testing to further dictate management Follow up as needed for persistent or progressing symptoms.

## 2022-07-21 ENCOUNTER — Ambulatory Visit: Payer: Self-pay | Admitting: *Deleted

## 2022-07-21 LAB — NOVEL CORONAVIRUS, NAA: SARS-CoV-2, NAA: NOT DETECTED

## 2022-07-21 NOTE — Telephone Encounter (Signed)
  Chief Complaint: Continues to cough a lot, can't sleep due to coughing.    She was seen by Talitha Givens, PA 07/20/2022. Symptoms: Coughing a lot.  The OTC medications do not help. Frequency: coughing a lot especially at night Pertinent Negatives: Patient denies the OTC cough medications giving her any relief. Disposition: '[]'$ ED /'[]'$ Urgent Care (no appt availability in office) / '[]'$ Appointment(In office/virtual)/ '[]'$  [facility]/ '[]'$ Home Care/ '[]'$ Refused Recommended Disposition /'[]'$ [facility]/ '[x]'$  Follow-up with PCP Additional Notes: I have sent a message to her PCP Dr. Wynetta Emery letting her know pt is still coughing a lot and is requesting the cough medication with codeine because it helps.

## 2022-07-21 NOTE — Telephone Encounter (Signed)
Reason for Disposition  [1] Follow-up call to recent contact AND [2] information only call, no triage required  Answer Assessment - Initial Assessment Questions 1. REASON FOR CALL or QUESTION: "What is your reason for calling today?" or "How can I best help you?" or "What question do you have that I can help answer?"     Pt returned call and was given her lab result message from [facility], PA-C dated 07/21/2022 at 12:18 PM.  Pt did not understand the message.   I let her know her flu tests for A & B were negative and the Covid result was not back yet.  She c/o having a lot of coughing.  She can't sleep at night due to coughing.  She has tried the OTC Mucinex and cough medications but they do not help her.  "The cough medicine with the codeine in it helps me".   "That's what Dr. Wynetta Emery usually gives me".  I let her know I would send a message to Dr. Wynetta Emery letting her know she was still coughing a lot even though she is taking the prednisone.  Protocols used: Information Only Call - No Triage-A-AH

## 2022-08-09 ENCOUNTER — Ambulatory Visit (INDEPENDENT_AMBULATORY_CARE_PROVIDER_SITE_OTHER): Payer: Medicare Other | Admitting: Family Medicine

## 2022-08-09 ENCOUNTER — Encounter: Payer: Self-pay | Admitting: Family Medicine

## 2022-08-09 VITALS — BP 132/83 | HR 118 | Temp 98.1°F | Ht 62.0 in | Wt 172.1 lb

## 2022-08-09 DIAGNOSIS — G4733 Obstructive sleep apnea (adult) (pediatric): Secondary | ICD-10-CM

## 2022-08-09 DIAGNOSIS — E782 Mixed hyperlipidemia: Secondary | ICD-10-CM | POA: Diagnosis not present

## 2022-08-09 DIAGNOSIS — M25512 Pain in left shoulder: Secondary | ICD-10-CM

## 2022-08-09 DIAGNOSIS — I700 Atherosclerosis of aorta: Secondary | ICD-10-CM | POA: Diagnosis not present

## 2022-08-09 DIAGNOSIS — I4891 Unspecified atrial fibrillation: Secondary | ICD-10-CM | POA: Diagnosis not present

## 2022-08-09 DIAGNOSIS — I129 Hypertensive chronic kidney disease with stage 1 through stage 4 chronic kidney disease, or unspecified chronic kidney disease: Secondary | ICD-10-CM

## 2022-08-09 LAB — MICROALBUMIN, URINE WAIVED
Creatinine, Urine Waived: 100 mg/dL (ref 10–300)
Microalb, Ur Waived: 80 mg/L — ABNORMAL HIGH (ref 0–19)

## 2022-08-09 MED ORDER — TRAZODONE HCL 50 MG PO TABS: 50.0000 mg | ORAL_TABLET | Freq: Every evening | ORAL | 2 refills | Status: DC | PRN

## 2022-08-09 MED ORDER — AMLODIPINE BESYLATE 2.5 MG PO TABS: 2.5000 mg | ORAL_TABLET | Freq: Every day | ORAL | 1 refills | Status: DC

## 2022-08-09 MED ORDER — LISINOPRIL-HYDROCHLOROTHIAZIDE 20-25 MG PO TABS: 1.0000 | ORAL_TABLET | Freq: Two times a day (BID) | ORAL | 1 refills | Status: DC

## 2022-08-09 MED ORDER — HYDRALAZINE HCL 50 MG PO TABS: 50.0000 mg | ORAL_TABLET | Freq: Two times a day (BID) | ORAL | 1 refills | Status: DC

## 2022-08-09 MED ORDER — MELOXICAM 7.5 MG PO TABS: 7.5000 mg | ORAL_TABLET | Freq: Every day | ORAL | 3 refills | Status: DC

## 2022-08-09 NOTE — Assessment & Plan Note (Signed)
Under good control on current regimen. Continue current regimen. Continue to monitor. Call with any concerns. Refills given. Labs drawn today.   

## 2022-08-09 NOTE — Assessment & Plan Note (Signed)
Seeing ENT. Note unavailable at this time. Will get her back in if needed.

## 2022-08-09 NOTE — Patient Instructions (Addendum)
Dr. Ubaldo Glassing [facility] [place] [address] [place], [place] [address] Phone: [phone] Aug 17, 2022 11:15AM

## 2022-08-09 NOTE — Progress Notes (Signed)
BP 132/83   Pulse (!) 118   Temp 98.1 F (36.7 C) (Oral)   Ht '5\' 2"'$  (1.575 m)   Wt 172 lb 1.6 oz (78.1 kg)   SpO2 96%   BMI 31.48 kg/m    Subjective:    Patient ID: Kristina Henson, female    DOB: 02-Mar-1956, 66 y.o.   MRN: [id number]  HPI: Kristina Henson is a 67 y.o. female  Chief Complaint  Patient presents with   Hypertension   Hyperlipidemia   HYPERTENSION / [place] Satisfied with current treatment? yes Duration of hypertension: chronic BP monitoring frequency: not checking BP medication side effects: no Past BP meds: amlodipine, lisinopril, HCTZ, hydralazine Duration of hyperlipidemia: chronic Cholesterol medication side effects: no Cholesterol supplements: none Past cholesterol medications: none Medication compliance: excellent compliance Aspirin: no Recent stressors: no Recurrent headaches: no Visual changes: no Palpitations: no Dyspnea: no Chest pain: no Lower extremity edema: no Dizzy/lightheaded: no  SLEEP APNEA Sleep apnea status: uncontrolled Duration: chronic Satisfied with current treatment?:  no CPAP use:  no Sleep quality with CPAP use: poor Treament compliance:poor compliance Last sleep study:  Treatments attempted: CPAP Wakes feeling refreshed:  no Daytime hypersomnolence:  yes Fatigue:  yes Insomnia:  yes Good sleep hygiene:  no Difficulty falling asleep:  no Difficulty staying asleep:  no Snoring bothers bed partner:  yes Observed apnea by bed partner: yes Obesity:  yes Hypertension: yes  Pulmonary hypertension:  no Coronary artery disease:  yes  Relevant past medical, surgical, family and social history reviewed and updated as indicated. Interim medical history since our last visit reviewed. Allergies and medications reviewed and updated.  Review of Systems  Constitutional: Negative.   Respiratory: Negative.    Cardiovascular: Negative.   Gastrointestinal: Negative.   Musculoskeletal: Negative.   Neurological:  Negative.   Psychiatric/Behavioral: Negative.      Per HPI unless specifically indicated above     Objective:    BP 132/83   Pulse (!) 118   Temp 98.1 F (36.7 C) (Oral)   Ht '5\' 2"'$  (1.575 m)   Wt 172 lb 1.6 oz (78.1 kg)   SpO2 96%   BMI 31.48 kg/m   Wt Readings from Last 3 Encounters:  08/09/22 172 lb 1.6 oz (78.1 kg)  07/20/22 166 lb 12.8 oz (75.7 kg)  06/22/22 169 lb (76.7 kg)    Physical Exam Vitals and nursing note reviewed.  Constitutional:      General: She is not in acute distress.    Appearance: Normal appearance. She is normal weight. She is not ill-appearing, toxic-appearing or diaphoretic.  HENT:     Head: Normocephalic and atraumatic.     Right Ear: External ear normal.     Left Ear: External ear normal.     Nose: Nose normal.     Mouth/Throat:     Mouth: Mucous membranes are moist.     Pharynx: Oropharynx is clear.  Eyes:     General: No scleral icterus.       Right eye: No discharge.        Left eye: No discharge.     Extraocular Movements: Extraocular movements intact.     Conjunctiva/sclera: Conjunctivae normal.     Pupils: Pupils are equal, round, and reactive to light.  Cardiovascular:     Rate and Rhythm: Normal rate and regular rhythm.     Pulses: Normal pulses.     Heart sounds: Normal heart sounds. No murmur heard.    No  friction rub. No gallop.  Pulmonary:     Effort: Pulmonary effort is normal. No respiratory distress.     Breath sounds: Normal breath sounds. No stridor. No wheezing, rhonchi or rales.  Chest:     Chest wall: No tenderness.  Musculoskeletal:        General: Normal range of motion.     Cervical back: Normal range of motion and neck supple.  Skin:    General: Skin is warm and dry.     Capillary Refill: Capillary refill takes less than 2 seconds.     Coloration: Skin is not jaundiced or pale.     Findings: No bruising, erythema, lesion or rash.  Neurological:     General: No focal deficit present.     Mental Status:  She is alert and oriented to person, place, and time. Mental status is at baseline.  Psychiatric:        Mood and Affect: Mood normal.        Behavior: Behavior normal.        Thought Content: Thought content normal.        Judgment: Judgment normal.     Results for orders placed or performed in visit on 07/20/22  Novel Coronavirus, NAA ([facility])   Specimen: Nasopharyngeal(NP) swabs in vial transport medium  Result Value Ref Range   SARS-CoV-2, NAA Not Detected Not Detected  Veritor Flu A/B Waived  Result Value Ref Range   Influenza A Negative Negative   Influenza B Negative Negative      Assessment & Plan:   Problem List Items Addressed This Visit       Cardiovascular and Mediastinum   Atrial fibrillation ([place])    Under good control on current regimen. Continue current regimen. Continue to monitor. Call with any concerns. Refills through cardiology. Call with any concerns. Labs drawn today.        Relevant Medications   amLODipine (NORVASC) 2.5 MG tablet   hydrALAZINE (APRESOLINE) 50 MG tablet   lisinopril-hydrochlorothiazide (ZESTORETIC) 20-25 MG tablet   Other Relevant Orders   CBC with Differential/Platelet   Comprehensive metabolic panel   Hardening of the aorta (main artery of the heart) (HCC)    Will keep BP and cholesterol under good control. Continue to monitor. Call with any concerns.       Relevant Medications   amLODipine (NORVASC) 2.5 MG tablet   hydrALAZINE (APRESOLINE) 50 MG tablet   lisinopril-hydrochlorothiazide (ZESTORETIC) 20-25 MG tablet     Respiratory   OSA (obstructive sleep apnea)    Seeing ENT. Note unavailable at this time. Will get her back in if needed.         Genitourinary   Benign hypertensive renal disease - Primary    Under good control on current regimen. Continue current regimen. Continue to monitor. Call with any concerns. Refills given. Labs drawn today.        Relevant Orders   Comprehensive metabolic panel    Urinalysis, Routine w reflex microscopic   TSH   Microalbumin, Urine Waived     Other   HLD (hyperlipidemia)    Under good control on current regimen. Continue current regimen. Continue to monitor. Call with any concerns. Refills given. Labs drawn today.       Relevant Medications   amLODipine (NORVASC) 2.5 MG tablet   hydrALAZINE (APRESOLINE) 50 MG tablet   lisinopril-hydrochlorothiazide (ZESTORETIC) 20-25 MG tablet   Other Relevant Orders   Comprehensive metabolic panel   Lipid Panel w/o Chol/HDL Ratio  Other Visit Diagnoses     Acute pain of left shoulder       Relevant Medications   meloxicam (MOBIC) 7.5 MG tablet        Follow up plan: Return in about 6 months (around 02/07/2023).

## 2022-08-09 NOTE — Assessment & Plan Note (Signed)
Will keep BP and cholesterol under good control. Continue to monitor. Call with any concerns.  

## 2022-08-09 NOTE — Assessment & Plan Note (Signed)
Under good control on current regimen. Continue current regimen. Continue to monitor. Call with any concerns. Refills through cardiology. Call with any concerns. Labs drawn today.

## 2022-08-10 ENCOUNTER — Other Ambulatory Visit: Payer: Self-pay | Admitting: Family Medicine

## 2022-08-10 LAB — LIPID PANEL W/O CHOL/HDL RATIO
Cholesterol, Total: 193 mg/dL (ref 100–199)
HDL: 54 mg/dL (ref >39)
LDL Chol Calc (NIH): 117 mg/dL — ABNORMAL HIGH (ref 0–99)
Triglycerides: 121 mg/dL (ref 0–149)
VLDL Cholesterol Cal: 22 mg/dL (ref 5–40)

## 2022-08-10 LAB — COMPREHENSIVE METABOLIC PANEL
ALT: 17 IU/L (ref 0–32)
AST: 14 IU/L (ref 0–40)
Albumin/Globulin Ratio: 1.4 (ref 1.2–2.2)
Albumin: 4 g/dL (ref 3.9–4.9)
Alkaline Phosphatase: 82 IU/L (ref 44–121)
BUN/Creatinine Ratio: 20 (ref 12–28)
BUN: 18 mg/dL (ref 8–27)
Bilirubin Total: 0.7 mg/dL (ref 0.0–1.2)
CO2: 25 mmol/L (ref 20–29)
Calcium: 9.6 mg/dL (ref 8.7–10.3)
Chloride: 102 mmol/L (ref 96–106)
Creatinine, Ser: 0.9 mg/dL (ref 0.57–1.00)
Globulin, Total: 2.9 g/dL (ref 1.5–4.5)
Glucose: 111 mg/dL — ABNORMAL HIGH (ref 70–99)
Potassium: 3.7 mmol/L (ref 3.5–5.2)
Sodium: 143 mmol/L (ref 134–144)
Total Protein: 6.9 g/dL (ref 6.0–8.5)
eGFR: 71 mL/min/{1.73_m2} (ref >59)

## 2022-08-10 LAB — CBC WITH DIFFERENTIAL/PLATELET
Basophils Absolute: 0 10*3/uL (ref 0.0–0.2)
Basos: 1 %
EOS (ABSOLUTE): 0 10*3/uL (ref 0.0–0.4)
Eos: 1 %
Hematocrit: 37.1 % (ref 34.0–46.6)
Hemoglobin: 11.9 g/dL (ref 11.1–15.9)
Immature Grans (Abs): 0 10*3/uL (ref 0.0–0.1)
Immature Granulocytes: 1 %
Lymphocytes Absolute: 0.6 10*3/uL — ABNORMAL LOW (ref 0.7–3.1)
Lymphs: 19 %
MCH: 27.5 pg (ref 26.6–33.0)
MCHC: 32.1 g/dL (ref 31.5–35.7)
MCV: 86 fL (ref 79–97)
Monocytes Absolute: 0.2 10*3/uL (ref 0.1–0.9)
Monocytes: 6 %
Neutrophils Absolute: 2.2 10*3/uL (ref 1.4–7.0)
Neutrophils: 72 %
Platelets: 110 10*3/uL — ABNORMAL LOW (ref 150–450)
RBC: 4.32 x10E6/uL (ref 3.77–5.28)
RDW: 16 % — ABNORMAL HIGH (ref 11.7–15.4)
WBC: 3.1 10*3/uL — ABNORMAL LOW (ref 3.4–10.8)

## 2022-08-10 LAB — TSH: TSH: 1.4 u[IU]/mL (ref 0.450–4.500)

## 2022-08-10 NOTE — Telephone Encounter (Signed)
Requested Prescriptions  Pending Prescriptions Disp Refills   FEROSUL 325 (65 Fe) MG tablet [Pharmacy Med Name: FeroSul 325 (65 Fe) MG Oral Tablet] 270 tablet 0    Sig: TAKE 1 TABLET BY MOUTH THREE TIMES DAILY WITH MEALS     Endocrinology:  Minerals - Iron Supplementation Passed - 08/10/2022  9:31 AM      Passed - HGB in normal range and within 360 days    Hemoglobin  Date Value Ref Range Status  08/09/2022 11.9 11.1 - 15.9 g/dL Final         Passed - HCT in normal range and within 360 days    Hematocrit  Date Value Ref Range Status  08/09/2022 37.1 34.0 - 46.6 % Final         Passed - RBC in normal range and within 360 days    RBC  Date Value Ref Range Status  08/09/2022 4.32 3.77 - 5.28 x10E6/uL Final  06/22/2022 4.54 3.87 - 5.11 MIL/uL Final         Passed - Fe (serum) in normal range and within 360 days    Iron  Date Value Ref Range Status  06/22/2022 54 28 - 170 ug/dL Final  08/07/2021 36 27 - 139 ug/dL Final   Iron Saturation  Date Value Ref Range Status  08/07/2021 12 (L) 15 - 55 % Final   Saturation Ratios  Date Value Ref Range Status  06/22/2022 16 10.4 - 31.8 % Final         Passed - Ferritin in normal range and within 360 days    Ferritin  Date Value Ref Range Status  06/22/2022 83 11 - 307 ng/mL Final    Comment:    Performed at [facility], [address] [place], [place] [address]  08/07/2021 182 (H) 15 - 150 ng/mL Final         Passed - Valid encounter within last 12 months    Recent Outpatient Visits           Yesterday Benign hypertensive renal disease   [facility] [place], Megan P, DO   3 weeks ago Symptoms of upper respiratory infection (URI)   [facility] [place], Erin E, PA-C   3 months ago Acute pain of left shoulder   [facility] [place], Erin E, PA-C   4 months ago Chronic bronchitis, unspecified chronic bronchitis type ([place])   [place], Megan P, DO   5 months  ago Acute bronchitis with COPD ([place])   [place], [place] P, DO       Future Appointments             In 6 months Johnson, Barb Merino, DO [employer], [place]   In 10 months McGowan, Gordan Payment [facility] [place]

## 2022-08-13 ENCOUNTER — Encounter: Payer: Self-pay | Admitting: Family Medicine

## 2022-08-25 ENCOUNTER — Ambulatory Visit: Payer: Medicare Other | Admitting: Family Medicine

## 2022-08-31 ENCOUNTER — Encounter: Payer: Self-pay | Admitting: Unknown Physician Specialty

## 2022-08-31 ENCOUNTER — Ambulatory Visit (INDEPENDENT_AMBULATORY_CARE_PROVIDER_SITE_OTHER): Payer: Medicare Other | Admitting: Unknown Physician Specialty

## 2022-08-31 VITALS — BP 128/87 | HR 114 | Temp 97.7°F | Wt 169.5 lb

## 2022-08-31 DIAGNOSIS — I4891 Unspecified atrial fibrillation: Secondary | ICD-10-CM | POA: Diagnosis not present

## 2022-08-31 DIAGNOSIS — J42 Unspecified chronic bronchitis: Secondary | ICD-10-CM

## 2022-08-31 DIAGNOSIS — J209 Acute bronchitis, unspecified: Secondary | ICD-10-CM | POA: Diagnosis not present

## 2022-08-31 LAB — VERITOR FLU A/B WAIVED
Influenza A: NEGATIVE
Influenza B: NEGATIVE

## 2022-08-31 MED ORDER — GUAIFENESIN-CODEINE 100-10 MG/5ML PO SOLN: 10.0000 mL | Freq: Three times a day (TID) | ORAL | 0 refills | Status: DC | PRN

## 2022-08-31 MED ORDER — PREDNISONE 20 MG PO TABS: 40.0000 mg | ORAL_TABLET | Freq: Every day | ORAL | 0 refills | Status: DC

## 2022-08-31 NOTE — Addendum Note (Signed)
Addended by: Kathrine Haddock on: 08/31/2022 04:19 PM   Modules accepted: Orders

## 2022-08-31 NOTE — Addendum Note (Signed)
Addended by: Georgina Peer on: 08/31/2022 04:36 PM   Modules accepted: Orders

## 2022-08-31 NOTE — Progress Notes (Signed)
BP 128/87   Pulse (!) 114   Temp 97.7 F (36.5 C) (Oral)   Wt 169 lb 8 oz (76.9 kg)   SpO2 95%   BMI 31.00 kg/m    Subjective:    Patient ID: Kristina Henson, female    DOB: [birth year], 66 y.o.   MRN: [id number]  HPI: Kristina Henson is a 66 y.o. female  Chief Complaint  Patient presents with   URI    Pt states she has had a cough, runny nose, and SOB since Friday    Cough This is a new problem. Episode onset: 4 days ago. The problem has been gradually worsening. The problem occurs constantly. The cough is Productive of purulent sputum. Associated symptoms include headaches, postnasal drip, rhinorrhea and shortness of breath. Pertinent negatives include no chest pain, chills, ear congestion, ear pain, fever, heartburn, hemoptysis, myalgias, nasal congestion, rash, sore throat, sweats, weight loss or wheezing.   Taking Breztri sounds like "as needed"  Sometimes 2-3 times/day.  Does not take her Albuterol.    Relevant past medical, surgical, family and social history reviewed and updated as indicated. Interim medical history since our last visit reviewed. Allergies and medications reviewed and updated.  Review of Systems  Per HPI unless specifically indicated above     Objective:    BP 128/87   Pulse (!) 114   Temp 97.7 F (36.5 C) (Oral)   Wt 169 lb 8 oz (76.9 kg)   SpO2 95%   BMI 31.00 kg/m   Wt Readings from Last 3 Encounters:  08/31/22 169 lb 8 oz (76.9 kg)  08/09/22 172 lb 1.6 oz (78.1 kg)  07/20/22 166 lb 12.8 oz (75.7 kg)    Physical Exam Constitutional:      General: She is not in acute distress.    Appearance: Normal appearance. She is well-developed.  HENT:     Head: Normocephalic and atraumatic.  Eyes:     General: Lids are normal. No scleral icterus.       Right eye: No discharge.        Left eye: No discharge.     Conjunctiva/sclera: Conjunctivae normal.  Neck:     Vascular: No carotid bruit or JVD.  Cardiovascular:     Rate and Rhythm:  Tachycardia present. Rhythm irregular.     Heart sounds: Normal heart sounds.  Pulmonary:     Effort: Pulmonary effort is normal. Tachypnea present. No respiratory distress.     Breath sounds: No stridor, decreased air movement or transmitted upper airway sounds. Decreased breath sounds present.  Abdominal:     Palpations: There is no hepatomegaly or splenomegaly.  Musculoskeletal:        General: Normal range of motion.     Cervical back: Normal range of motion and neck supple.  Skin:    General: Skin is warm and dry.     Coloration: Skin is not pale.     Findings: No rash.  Neurological:     Mental Status: She is alert and oriented to person, place, and time.  Psychiatric:        Behavior: Behavior normal.        Thought Content: Thought content normal.        Judgment: Judgment normal.     Results for orders placed or performed in visit on 08/09/22  CBC with Differential/Platelet  Result Value Ref Range   WBC 3.1 (L) 3.4 - 10.8 x10E3/uL   RBC 4.32 3.77 - 5.28  x10E6/uL   Hemoglobin 11.9 11.1 - 15.9 g/dL   Hematocrit 37.1 34.0 - 46.6 %   MCV 86 79 - 97 fL   MCH 27.5 26.6 - 33.0 pg   MCHC 32.1 31.5 - 35.7 g/dL   RDW 16.0 (H) 11.7 - 15.4 %   Platelets 110 (L) 150 - 450 x10E3/uL   Neutrophils 72 Not Estab. %   Lymphs 19 Not Estab. %   Monocytes 6 Not Estab. %   Eos 1 Not Estab. %   Basos 1 Not Estab. %   Neutrophils Absolute 2.2 1.4 - 7.0 x10E3/uL   Lymphocytes Absolute 0.6 (L) 0.7 - 3.1 x10E3/uL   Monocytes Absolute 0.2 0.1 - 0.9 x10E3/uL   EOS (ABSOLUTE) 0.0 0.0 - 0.4 x10E3/uL   Basophils Absolute 0.0 0.0 - 0.2 x10E3/uL   Immature Granulocytes 1 Not Estab. %   Immature Grans (Abs) 0.0 0.0 - 0.1 x10E3/uL  Comprehensive metabolic panel  Result Value Ref Range   Glucose 111 (H) 70 - 99 mg/dL   BUN 18 8 - 27 mg/dL   Creatinine, Ser 0.90 0.57 - 1.00 mg/dL   eGFR 71 >59 mL/min/1.73   BUN/Creatinine Ratio 20 12 - 28   Sodium 143 134 - 144 mmol/L   Potassium 3.7 3.5 -  5.2 mmol/L   Chloride 102 96 - 106 mmol/L   CO2 25 20 - 29 mmol/L   Calcium 9.6 8.7 - 10.3 mg/dL   Total Protein 6.9 6.0 - 8.5 g/dL   Albumin 4.0 3.9 - 4.9 g/dL   Globulin, Total 2.9 1.5 - 4.5 g/dL   Albumin/Globulin Ratio 1.4 1.2 - 2.2   Bilirubin Total 0.7 0.0 - 1.2 mg/dL   Alkaline Phosphatase 82 44 - 121 IU/L   AST 14 0 - 40 IU/L   ALT 17 0 - 32 IU/L  Lipid Panel w/o Chol/HDL Ratio  Result Value Ref Range   Cholesterol, Total 193 100 - 199 mg/dL   Triglycerides 121 0 - 149 mg/dL   HDL 54 >39 mg/dL   VLDL Cholesterol Cal 22 5 - 40 mg/dL   LDL Chol Calc (NIH) 117 (H) 0 - 99 mg/dL  TSH  Result Value Ref Range   TSH 1.400 0.450 - 4.500 uIU/mL  Microalbumin, Urine Waived  Result Value Ref Range   Microalb, Ur Waived 80 (H) 0 - 19 mg/L   Creatinine, Urine Waived 100 10 - 300 mg/dL   Microalb/Creat Ratio 30-300 (H) <30 mg/g      Assessment & Plan:   Problem List Items Addressed This Visit       Unprioritized   Atrial fibrillation ([place])    Tachycardic today probably related to cough.  Pt ed on using Breztri appropriately.  Lost to f/u with cardiology.  Encouraged to f/u on her appt with [facility] where she has been going      Other Visit Diagnoses     Acute exacerbation of chronic bronchitis ([place])    -  Primary   Check Covid status.  Rx for prednisone 40 mg daily for 5 days.  Discussed correct use of Breztri. Unable to get a chest x-ray due to transportation.   Relevant Medications   predniSONE (DELTASONE) 20 MG tablet   guaiFENesin-codeine 100-10 MG/5ML syrup   Other Relevant Orders   DG Chest 2 View       Will rx Doxycycline due to inability to get transportation for x-ray and cannot r/o pneumonia   Follow up plan: Return if symptoms  worsen or fail to improve.

## 2022-08-31 NOTE — Assessment & Plan Note (Signed)
Tachycardic today probably related to cough.  Pt ed on using Breztri appropriately.  Lost to f/u with cardiology.  Encouraged to f/u on her appt with [facility] where she has been going

## 2022-09-01 LAB — NOVEL CORONAVIRUS, NAA: SARS-CoV-2, NAA: NOT DETECTED

## 2022-09-01 NOTE — Progress Notes (Signed)
Good morning, please let Kristina Henson know her Covid returned negative:)

## 2022-09-06 ENCOUNTER — Other Ambulatory Visit: Payer: Self-pay | Admitting: Family Medicine

## 2022-09-06 NOTE — Telephone Encounter (Signed)
Requested medication (s) are due for refill today - expired Rx  Requested medication (s) are on the active medication list -yes  Future visit scheduled -yes  Last refill: 04/03/2018 #30  Notes to clinic: non delegated Rx, expired Rx  Requested Prescriptions  Pending Prescriptions Disp Refills   amiodarone (PACERONE) 200 MG tablet 30 tablet 0    Sig: Take 1 tablet (200 mg total) by mouth every morning.     Not Delegated - Cardiovascular: Antiarrhythmic Agents - amiodarone Failed - 09/06/2022 10:04 AM      Failed - This refill cannot be delegated      Failed - Manual Review: Eye exam recommended every 12 months      Failed - Mg Level in normal range and within 360 days    Magnesium  Date Value Ref Range Status  08/08/2020 2.1 1.7 - 2.4 mg/dL Final    Comment:    Performed at [facility], [address] [place], [place] [address]         Failed - Patient had ECG in the last 180 days      Failed - Last Heart Rate in normal range    Pulse Readings from Last 1 Encounters:  08/31/22 (!) 114         Failed - Patient had chest x-ray within the last 6 months      Passed - TSH in normal range and within 360 days    TSH  Date Value Ref Range Status  08/09/2022 1.400 0.450 - 4.500 uIU/mL Final         Passed - K in normal range and within 180 days    Potassium  Date Value Ref Range Status  08/09/2022 3.7 3.5 - 5.2 mmol/L Final         Passed - AST in normal range and within 180 days    AST  Date Value Ref Range Status  08/09/2022 14 0 - 40 IU/L Final         Passed - ALT in normal range and within 180 days    ALT  Date Value Ref Range Status  08/09/2022 17 0 - 32 IU/L Final         Passed - Patient is not pregnant      Passed - Last BP in normal range    BP Readings from Last 1 Encounters:  08/31/22 128/87         Passed - Valid encounter within last 6 months    Recent Outpatient Visits           6 days ago Acute exacerbation of chronic bronchitis  ([place])   [facility] Kathrine Haddock, NP   4 weeks ago Benign hypertensive renal disease   [facility] Baxter, Megan P, DO   1 month ago Symptoms of upper respiratory infection (URI)   [facility] Mecum, Erin E, PA-C   4 months ago Acute pain of left shoulder   [facility] Mecum, Erin E, PA-C   5 months ago Chronic bronchitis, unspecified chronic bronchitis type [facility])   [place], Megan P, DO       Future Appointments             In 5 months Johnson, Barb Merino, DO [employer], [facility]   In 9 months McGowan, Gordan Payment [facility]               Requested Prescriptions  Pending Prescriptions Disp Refills   amiodarone (PACERONE) 200 MG tablet 30 tablet 0    Sig: Take 1 tablet (200 mg total) by mouth every morning.     Not Delegated - Cardiovascular: Antiarrhythmic Agents - amiodarone Failed - 09/06/2022 10:04 AM      Failed - This refill cannot be delegated      Failed - Manual Review: Eye exam recommended every 12 months      Failed - Mg Level in normal range and within 360 days    Magnesium  Date Value Ref Range Status  08/08/2020 2.1 1.7 - 2.4 mg/dL Final    Comment:    Performed at [facility], [address] [place], [place] [address]         Failed - Patient had ECG in the last 180 days      Failed - Last Heart Rate in normal range    Pulse Readings from Last 1 Encounters:  08/31/22 (!) 114         Failed - Patient had chest x-ray within the last 6 months      Passed - TSH in normal range and within 360 days    TSH  Date Value Ref Range Status  08/09/2022 1.400 0.450 - 4.500 uIU/mL Final         Passed - K in normal range and within 180 days    Potassium  Date Value Ref Range Status  08/09/2022 3.7 3.5 - 5.2 mmol/L Final         Passed - AST in normal range and within 180 days    AST  Date Value Ref Range Status  08/09/2022 14 0  - 40 IU/L Final         Passed - ALT in normal range and within 180 days    ALT  Date Value Ref Range Status  08/09/2022 17 0 - 32 IU/L Final         Passed - Patient is not pregnant      Passed - Last BP in normal range    BP Readings from Last 1 Encounters:  08/31/22 128/87         Passed - Valid encounter within last 6 months    Recent Outpatient Visits           6 days ago Acute exacerbation of chronic bronchitis ([place])   [facility] Kathrine Haddock, NP   4 weeks ago Benign hypertensive renal disease   [facility] Mu[place], Megan P, DO   1 month ago Symptoms of upper respiratory infection (URI)   [facility] Mecum, Erin E, PA-C   4 months ago Acute pain of left shoulder   [facility] Mecum, Erin E, PA-C   5 months ago Chronic bronchitis, unspecified chronic bronchitis type [facility])   [place], Megan P, DO       Future Appointments             In 5 months Johnson, Barb Merino, DO [employer], [place]   In 9 months McGowan, Gordan Payment [place]

## 2022-09-06 NOTE — Telephone Encounter (Signed)
Medication Refill - Medication: amiodarone (PACERONE) 200 MG tablet   Has the patient contacted their pharmacy? Yes.   Pt told to contact provider  Preferred Pharmacy (with phone number or street name):  [facility] [place] [place] - [address] Phone: [phone]  Fax: [phone]     Has the patient been seen for an appointment in the last year OR does the patient have an upcoming appointment? Yes.    Agent: Please be advised that RX refills may take up to 3 business days. We ask that you follow-up with your pharmacy.

## 2022-09-07 ENCOUNTER — Encounter: Payer: Self-pay | Admitting: Family Medicine

## 2022-09-07 ENCOUNTER — Ambulatory Visit (INDEPENDENT_AMBULATORY_CARE_PROVIDER_SITE_OTHER): Payer: Medicare Other | Admitting: Family Medicine

## 2022-09-07 VITALS — BP 138/78 | HR 103 | Temp 98.3°F | Wt 172.9 lb

## 2022-09-07 DIAGNOSIS — R8290 Unspecified abnormal findings in urine: Secondary | ICD-10-CM | POA: Diagnosis not present

## 2022-09-07 DIAGNOSIS — R8281 Pyuria: Secondary | ICD-10-CM | POA: Diagnosis not present

## 2022-09-07 DIAGNOSIS — Z5982 Transportation insecurity: Secondary | ICD-10-CM

## 2022-09-07 DIAGNOSIS — N3000 Acute cystitis without hematuria: Secondary | ICD-10-CM

## 2022-09-07 MED ORDER — DOXYCYCLINE HYCLATE 100 MG PO TABS: 100.0000 mg | ORAL_TABLET | Freq: Two times a day (BID) | ORAL | 0 refills | Status: DC

## 2022-09-07 NOTE — Progress Notes (Signed)
BP 138/78   Pulse (!) 103   Temp 98.3 F (36.8 C) (Oral)   Wt 172 lb 14.4 oz (78.4 kg)   SpO2 96%   BMI 31.62 kg/m    Subjective:    Patient ID: Kristina Henson, female    DOB: 03-22-1956, 66 y.o.   MRN: [id number]  HPI: Kristina Henson is a 66 y.o. female  Chief Complaint  Patient presents with   Urinary Frequency    Patient says her symptoms started yesterday. Patient declines taking any medication OTC.    Urine Odor   URINARY SYMPTOMS Duration: 1 day Dysuria: no Urinary frequency: yes Urgency: yes Small volume voids: yes Symptom severity: moderate Urinary incontinence: no Foul odor: yes Hematuria: no Abdominal pain: no Back pain: no Suprapubic pain/pressure: no Flank pain: no Fever:  no Vomiting: no Relief with cranberry juice: no Relief with pyridium: no Status: worse Previous urinary tract infection: yes Recurrent urinary tract infection: no Sexual activity: No sexually active History of sexually transmitted disease: no Vaginal discharge: no Treatments attempted: increasing fluids   Relevant past medical, surgical, family and social history reviewed and updated as indicated. Interim medical history since our last visit reviewed. Allergies and medications reviewed and updated.  Review of Systems  Constitutional: Negative.   Respiratory: Negative.    Gastrointestinal: Negative.   Genitourinary:  Positive for frequency and urgency. Negative for decreased urine volume, difficulty urinating, dyspareunia, dysuria, enuresis, flank pain, genital sores, hematuria, menstrual problem, pelvic pain, vaginal bleeding, vaginal discharge and vaginal pain.    Per HPI unless specifically indicated above     Objective:    BP 138/78   Pulse (!) 103   Temp 98.3 F (36.8 C) (Oral)   Wt 172 lb 14.4 oz (78.4 kg)   SpO2 96%   BMI 31.62 kg/m   Wt Readings from Last 3 Encounters:  09/07/22 172 lb 14.4 oz (78.4 kg)  08/31/22 169 lb 8 oz (76.9 kg)  08/09/22 172 lb  1.6 oz (78.1 kg)    Physical Exam Vitals and nursing note reviewed.  Constitutional:      General: She is not in acute distress.    Appearance: Normal appearance. She is not ill-appearing, toxic-appearing or diaphoretic.  HENT:     Head: Normocephalic and atraumatic.     Right Ear: External ear normal.     Left Ear: External ear normal.     Nose: Nose normal.     Mouth/Throat:     Mouth: Mucous membranes are moist.     Pharynx: Oropharynx is clear.  Eyes:     General: No scleral icterus.       Right eye: No discharge.        Left eye: No discharge.     Extraocular Movements: Extraocular movements intact.     Conjunctiva/sclera: Conjunctivae normal.     Pupils: Pupils are equal, round, and reactive to light.  Cardiovascular:     Rate and Rhythm: Normal rate and regular rhythm.     Pulses: Normal pulses.     Heart sounds: Normal heart sounds. No murmur heard.    No friction rub. No gallop.  Pulmonary:     Effort: Pulmonary effort is normal. No respiratory distress.     Breath sounds: Normal breath sounds. No stridor. No wheezing, rhonchi or rales.  Chest:     Chest wall: No tenderness.  Musculoskeletal:        General: Normal range of motion.  Cervical back: Normal range of motion and neck supple.  Skin:    General: Skin is warm and dry.     Capillary Refill: Capillary refill takes less than 2 seconds.     Coloration: Skin is not jaundiced or pale.     Findings: No bruising, erythema, lesion or rash.  Neurological:     General: No focal deficit present.     Mental Status: She is alert and oriented to person, place, and time. Mental status is at baseline.  Psychiatric:        Mood and Affect: Mood normal.        Behavior: Behavior normal.        Thought Content: Thought content normal.        Judgment: Judgment normal.     Results for orders placed or performed in visit on 08/31/22  Novel Coronavirus, NAA ([facility])   Specimen: Nasopharyngeal(NP) swabs in vial  transport medium  Result Value Ref Range   SARS-CoV-2, NAA Not Detected Not Detected  Influenza A & B (STAT)  Result Value Ref Range   Influenza A Negative Negative   Influenza B Negative Negative      Assessment & Plan:   Problem List Items Addressed This Visit   None Visit Diagnoses     Acute cystitis without hematuria    -  Primary   Will treat with doxycycline. Call with any concerns or if not getting better. Continue to monitor.   Foul smelling urine       Relevant Orders   Urinalysis, Routine w reflex microscopic   Pyuria       Relevant Orders   Urine Culture   Transportation insecurity       Needs help getting appointments to [place]. Referral generated today.   Relevant Orders   AMB Referral to [place] ([facility] Patients)        Follow up plan: Return as scheduled.

## 2022-09-08 LAB — URINALYSIS, ROUTINE W REFLEX MICROSCOPIC
Bilirubin, UA: NEGATIVE
Glucose, UA: NEGATIVE
Ketones, UA: NEGATIVE
Nitrite, UA: NEGATIVE
Protein,UA: NEGATIVE
Specific Gravity, UA: 1.015 (ref 1.005–1.030)
Urobilinogen, Ur: 0.2 mg/dL (ref 0.2–1.0)
pH, UA: 6 (ref 5.0–7.5)

## 2022-09-08 LAB — MICROSCOPIC EXAMINATION
Casts: NONE SEEN /lpf
Epithelial Cells (non renal): >10 /hpf — AB (ref 0–10)
RBC, Urine: NONE SEEN /hpf (ref 0–2)
WBC, UA: >30 /hpf — AB (ref 0–5)

## 2022-09-09 ENCOUNTER — Telehealth: Payer: Self-pay

## 2022-09-09 NOTE — Telephone Encounter (Signed)
   Telephone encounter was:  Unsuccessful.  09/09/2022 Name: Kristina Henson MRN: [id number] DOB: [birth year]  Unsuccessful outbound call made today to assist with:  Transportation Needs   Outreach Attempt:  1st Attempt  A HIPAA compliant voice message was left requesting a return call.  Instructed patient to call back at [phone].  [place] Resource Care Guide   [email or web address]  ?? [phone]   Website: [email or web address]  [email or web address]

## 2022-09-10 ENCOUNTER — Telehealth: Payer: Self-pay

## 2022-09-10 NOTE — Telephone Encounter (Signed)
   Telephone encounter was:  Successful.  09/10/2022 Name: SHIKITA VAILLANCOURT MRN: [id number] DOB: 09/18/1956  ASHELEY HELLBERG is a 66 y.o. year old female who is a primary care patient of Valerie Roys, DO . The community resource team was consulted for assistance with Transportation Needs   Care guide performed the following interventions:  Spoke with N[place] at [facility] transportation  .  Follow Up Plan:   N'dea will contact the patient to do an application over the phone.  [place] Resource Care Guide   ??[email or web address]  ?? [phone]   Website: [email or web address]  [email or web address]

## 2022-09-10 NOTE — Telephone Encounter (Signed)
   Telephone encounter was:  Unsuccessful.  09/10/2022 Name: Kristina Henson MRN: [id number] DOB: 29-May-1956  Unsuccessful outbound call made today to assist with:  Transportation Needs   Outreach Attempt:  2nd Attempt  A HIPAA compliant voice message was left requesting a return call.  Instructed patient to call back at [phone]. Left message on voicemail for patient to return my call regarding Medicaid transportation. Also left message for patient to expect a call from [place] at [facility] transportation [phone].  [place] Resource Care Guide   ??[email or web address]  ?? [phone]   Website: [email or web address]  [email or web address]

## 2022-09-11 LAB — URINE CULTURE

## 2022-09-13 ENCOUNTER — Telehealth: Payer: Self-pay

## 2022-09-13 NOTE — Telephone Encounter (Signed)
   Telephone encounter was:  Unsuccessful.  09/13/2022 Name: Kristina Henson MRN: [id number] DOB: 03-01-1956  Unsuccessful outbound call made today to assist with:  Transportation Needs   Outreach Attempt:  3rd Attempt.  Referral closed unable to contact patient.  A HIPAA compliant voice message was left requesting a return call.  Instructed patient to call back at [phone].Left message on voicemail for patient to return my call regarding Medicaid transportation.  Also left the number for [facility].  [place] Resource Care Guide   ??[email or web address]  ?? [phone]   Website: [email or web address]  [email or web address]

## 2022-09-22 DIAGNOSIS — I10 Essential (primary) hypertension: Secondary | ICD-10-CM | POA: Diagnosis not present

## 2022-09-22 DIAGNOSIS — I480 Paroxysmal atrial fibrillation: Secondary | ICD-10-CM | POA: Diagnosis not present

## 2022-09-22 DIAGNOSIS — I639 Cerebral infarction, unspecified: Secondary | ICD-10-CM | POA: Diagnosis not present

## 2022-09-22 DIAGNOSIS — I700 Atherosclerosis of aorta: Secondary | ICD-10-CM | POA: Diagnosis not present

## 2022-09-22 DIAGNOSIS — E7801 Familial hypercholesterolemia: Secondary | ICD-10-CM | POA: Diagnosis not present

## 2022-10-14 ENCOUNTER — Other Ambulatory Visit: Payer: Self-pay | Admitting: Family Medicine

## 2022-10-14 DIAGNOSIS — Z1231 Encounter for screening mammogram for malignant neoplasm of breast: Secondary | ICD-10-CM

## 2022-11-08 DIAGNOSIS — Z23 Encounter for immunization: Secondary | ICD-10-CM | POA: Diagnosis not present

## 2022-11-16 ENCOUNTER — Ambulatory Visit (INDEPENDENT_AMBULATORY_CARE_PROVIDER_SITE_OTHER): Payer: Medicare Other

## 2022-11-16 VITALS — Ht 62.0 in | Wt 172.0 lb

## 2022-11-16 DIAGNOSIS — Z0000 Encounter for general adult medical examination without abnormal findings: Secondary | ICD-10-CM

## 2022-11-16 NOTE — Patient Instructions (Signed)
Kristina Henson , Thank you for taking time to come for your Medicare Wellness Visit. I appreciate your ongoing commitment to your health goals. Please review the following plan we discussed and let me know if I can assist you in the future.   These are the goals we discussed:  Goals      DIET - EAT MORE FRUITS AND VEGETABLES     DIET - INCREASE WATER INTAKE     Recommend drinking at least 6-8 glasses of water a day      Patient Stated     11/07/2020, drink more water     Patient Stated     Continue to keep drinking water        This is a list of the screening recommended for you and due dates:  Health Maintenance  Topic Date Due   Zoster (Shingles) Vaccine (1 of 2) Never done   COVID-19 Vaccine (3 - 2023-24 season) 05/28/2022   Mammogram  11/17/2023   Medicare Annual Wellness Visit  11/17/2023   DTaP/Tdap/Td vaccine (2 - Tdap) 01/26/2025   Colon Cancer Screening  12/03/2027   Flu Shot  Completed   Hepatitis C Screening: USPSTF Recommendation to screen - Ages 18-79 yo.  Completed   HIV Screening  Completed   HPV Vaccine  Aged Out    Advanced directives: no  Conditions/risks identified: none  Next appointment: Follow up in one year for your annual wellness visit. 11/22/23 @ 8:15 am by phone  Preventive Care 40-64 Years, Female Preventive care refers to lifestyle choices and visits with your health care provider that can promote health and wellness. What does preventive care include? A yearly physical exam. This is also called an annual well check. Dental exams once or twice a year. Routine eye exams. Ask your health care provider how often you should have your eyes checked. Personal lifestyle choices, including: Daily care of your teeth and gums. Regular physical activity. Eating a healthy diet. Avoiding tobacco and drug use. Limiting alcohol use. Practicing safe sex. Taking low-dose aspirin daily starting at age 55. Taking vitamin and mineral supplements as recommended  by your health care provider. What happens during an annual well check? The services and screenings done by your health care provider during your annual well check will depend on your age, overall health, lifestyle risk factors, and family history of disease. Counseling  Your health care provider may ask you questions about your: Alcohol use. Tobacco use. Drug use. Emotional well-being. Home and relationship well-being. Sexual activity. Eating habits. Work and work Statistician. Method of birth control. Menstrual cycle. Pregnancy history. Screening  You may have the following tests or measurements: Height, weight, and BMI. Blood pressure. Lipid and cholesterol levels. These may be checked every 5 years, or more frequently if you are over 59 years old. Skin check. Lung cancer screening. You may have this screening every year starting at age 51 if you have a 30-pack-year history of smoking and currently smoke or have quit within the past 15 years. Fecal occult blood test (FOBT) of the stool. You may have this test every year starting at age 55. Flexible sigmoidoscopy or colonoscopy. You may have a sigmoidoscopy every 5 years or a colonoscopy every 10 years starting at age 81. Hepatitis C blood test. Hepatitis B blood test. Sexually transmitted disease (STD) testing. Diabetes screening. This is done by checking your blood sugar (glucose) after you have not eaten for a while (fasting). You may have this done every 1-3  years. Mammogram. This may be done every 1-2 years. Talk to your health care provider about when you should start having regular mammograms. This may depend on whether you have a family history of breast cancer. BRCA-related cancer screening. This may be done if you have a family history of breast, ovarian, tubal, or peritoneal cancers. Pelvic exam and Pap test. This may be done every 3 years starting at age 25. Starting at age 63, this may be done every 5 years if you have a  Pap test in combination with an HPV test. Bone density scan. This is done to screen for osteoporosis. You may have this scan if you are at high risk for osteoporosis. Discuss your test results, treatment options, and if necessary, the need for more tests with your health care provider. Vaccines  Your health care provider may recommend certain vaccines, such as: Influenza vaccine. This is recommended every year. Tetanus, diphtheria, and acellular pertussis (Tdap, Td) vaccine. You may need a Td booster every 10 years. Zoster vaccine. You may need this after age 51. Pneumococcal 13-valent conjugate (PCV13) vaccine. You may need this if you have certain conditions and were not previously vaccinated. Pneumococcal polysaccharide (PPSV23) vaccine. You may need one or two doses if you smoke cigarettes or if you have certain conditions. Talk to your health care provider about which screenings and vaccines you need and how often you need them. This information is not intended to replace advice given to you by your health care provider. Make sure you discuss any questions you have with your health care provider. Document Released: 10/10/2015 Document Revised: 06/02/2016 Document Reviewed: 07/15/2015 Elsevier Interactive Patient Education  2017 [place] Prevention in the Home Falls can cause injuries. They can happen to people of all ages. There are many things you can do to make your home safe and to help prevent falls. What can I do on the outside of my home? Regularly fix the edges of walkways and driveways and fix any cracks. Remove anything that might make you trip as you walk through a door, such as a raised step or threshold. Trim any bushes or trees on the path to your home. Use bright outdoor lighting. Clear any walking paths of anything that might make someone trip, such as rocks or tools. Regularly check to see if handrails are loose or broken. Make sure that both sides of any  steps have handrails. Any raised decks and porches should have guardrails on the edges. Have any leaves, snow, or ice cleared regularly. Use sand or salt on walking paths during winter. Clean up any spills in your garage right away. This includes oil or grease spills. What can I do in the bathroom? Use night lights. Install grab bars by the toilet and in the tub and shower. Do not use towel bars as grab bars. Use non-skid mats or decals in the tub or shower. If you need to sit down in the shower, use a plastic, non-slip stool. Keep the floor dry. Clean up any water that spills on the floor as soon as it happens. Remove soap buildup in the tub or shower regularly. Attach bath mats securely with double-sided non-slip rug tape. Do not have throw rugs and other things on the floor that can make you trip. What can I do in the bedroom? Use night lights. Make sure that you have a light by your bed that is easy to reach. Do not use any sheets or blankets  that are too big for your bed. They should not hang down onto the floor. Have a firm chair that has side arms. You can use this for support while you get dressed. Do not have throw rugs and other things on the floor that can make you trip. What can I do in the kitchen? Clean up any spills right away. Avoid walking on wet floors. Keep items that you use a lot in easy-to-reach places. If you need to reach something above you, use a strong step stool that has a grab bar. Keep electrical cords out of the way. Do not use floor polish or wax that makes floors slippery. If you must use wax, use non-skid floor wax. Do not have throw rugs and other things on the floor that can make you trip. What can I do with my stairs? Do not leave any items on the stairs. Make sure that there are handrails on both sides of the stairs and use them. Fix handrails that are broken or loose. Make sure that handrails are as long as the stairways. Check any carpeting to  make sure that it is firmly attached to the stairs. Fix any carpet that is loose or worn. Avoid having throw rugs at the top or bottom of the stairs. If you do have throw rugs, attach them to the floor with carpet tape. Make sure that you have a light switch at the top of the stairs and the bottom of the stairs. If you do not have them, ask someone to add them for you. What else can I do to help prevent falls? Wear shoes that: Do not have high heels. Have rubber bottoms. Are comfortable and fit you well. Are closed at the toe. Do not wear sandals. If you use a stepladder: Make sure that it is fully opened. Do not climb a closed stepladder. Make sure that both sides of the stepladder are locked into place. Ask someone to hold it for you, if possible. Clearly mark and make sure that you can see: Any grab bars or handrails. First and last steps. Where the edge of each step is. Use tools that help you move around (mobility aids) if they are needed. These include: Canes. Walkers. Scooters. Crutches. Turn on the lights when you go into a dark area. Replace any light bulbs as soon as they burn out. Set up your furniture so you have a clear path. Avoid moving your furniture around. If any of your floors are uneven, fix them. If there are any pets around you, be aware of where they are. Review your medicines with your doctor. Some medicines can make you feel dizzy. This can increase your chance of falling. Ask your doctor what other things that you can do to help prevent falls. This information is not intended to replace advice given to you by your health care provider. Make sure you discuss any questions you have with your health care provider. Document Released: 07/10/2009 Document Revised: 02/19/2016 Document Reviewed: 10/18/2014 Elsevier Interactive Patient Education  2017 [employer].

## 2022-11-16 NOTE — Progress Notes (Signed)
I connected with  Kristina Henson on 11/16/22 by a audio enabled telemedicine application and verified that I am speaking with the correct person using two identifiers.  Patient Location: Home  Provider Location: Home Office  I discussed the limitations of evaluation and management by telemedicine. The patient expressed understanding and agreed to proceed.  Subjective:   Kristina Henson is a 67 y.o. female who presents for Medicare Annual (Subsequent) preventive examination.  Review of Systems     Cardiac Risk Factors include: advanced age (>1mn, >>35women);hypertension     Objective:    There were no vitals filed for this visit. There is no height or weight on file to calculate BMI.     11/16/2022    9:20 AM 06/22/2022    2:44 PM 02/11/2022   11:20 AM 11/30/2021    9:43 AM 11/27/2021    1:06 PM 11/12/2021   10:58 AM 11/09/2021   10:14 AM  Advanced Directives  Does Patient Have a Medical Advance Directive? No No No Yes No No No  Type of Advance Directive    Living will     Does patient want to make changes to medical advance directive?    No - Patient declined     Would patient like information on creating a medical advance directive? No - Patient declined   No - Patient declined  No - Patient declined     Current Medications (verified) Outpatient Encounter Medications as of 11/16/2022  Medication Sig   albuterol (VENTOLIN HFA) 108 (90 Base) MCG/ACT inhaler Inhale 2 puffs into the lungs every 6 (six) hours as needed for wheezing or shortness of breath.   amiodarone (PACERONE) 200 MG tablet Take 1 tablet (200 mg total) by mouth every morning.   amLODipine (NORVASC) 2.5 MG tablet Take 1 tablet (2.5 mg total) by mouth daily.   aspirin EC 81 MG tablet Take 81 mg by mouth daily.    Budeson-Glycopyrrol-Formoterol (BREZTRI AEROSPHERE) 160-9-4.8 MCG/ACT AERO Inhale 2 puffs into the lungs 2 (two) times daily.   FEROSUL 325 (65 Fe) MG tablet TAKE 1 TABLET BY MOUTH THREE TIMES DAILY WITH  MEALS   hydrALAZINE (APRESOLINE) 50 MG tablet Take 1 tablet (50 mg total) by mouth 2 (two) times daily.   lisinopril-hydrochlorothiazide (ZESTORETIC) 20-25 MG tablet Take 1 tablet by mouth 2 (two) times daily.   meloxicam (MOBIC) 7.5 MG tablet Take 1 tablet (7.5 mg total) by mouth daily.   traZODone (DESYREL) 50 MG tablet Take 1-2 tablets (50-100 mg total) by mouth at bedtime as needed for sleep.   baclofen (LIORESAL) 10 MG tablet Take 1 tablet (10 mg total) by mouth at bedtime as needed for muscle spasms. (Patient not taking: Reported on 11/16/2022)   doxycycline (VIBRA-TABS) 100 MG tablet Take 1 tablet (100 mg total) by mouth 2 (two) times daily. (Patient not taking: Reported on 11/16/2022)   guaiFENesin-codeine 100-10 MG/5ML syrup Take 10 mLs by mouth 3 (three) times daily as needed for cough. (Patient not taking: Reported on 11/16/2022)   No facility-administered encounter medications on file as of 11/16/2022.    Allergies (verified) Amoxicillin   History: Past Medical History:  Diagnosis Date   Aortic atherosclerosis ([facility]    Arthritis    BACK-LUMBAR   Ascending aorta dilation ([place] 10/16/2020   a.) TTE 10/16/2020 --> borderline at 37 mm.   Atrial fibrillation ([facility]    a.) CHA2DS2-VASc = 5 (sex, HTN, CVA x 2, aortic plaque). b.) s/p ablation in 2008. c.)  rate/rhythm maintained on oral amiodarone; not currently on daily anticoagulation.   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   Pneumonia 2016   Sleep apnea    Stroke [facility]) 2012   Past Surgical History:  Procedure Laterality Date   BIOPSY SALIVARY GLAND Left  04/29/2016   Procedure: SUBMANDIBULAR GLAND BIOPSY (FNA); Location: [place]; Surgeon: Markus Daft, MD (IR)   cardiac catherization     CARDIAC ELECTROPHYSIOLOGY STUDY AND ABLATION N/A 2008   Procedure: ATRIAL FIBRILLATION ABLATION; Location: [facility]   COLONOSCOPY WITH PROPOFOL N/A 11/18/2015   Procedure: COLONOSCOPY WITH PROPOFOL;  Surgeon: Lucilla Lame, MD;  Location: [facility] ENDOSCOPY;  Service: Endoscopy;  Laterality: N/A;   COLONOSCOPY WITH PROPOFOL N/A 12/02/2020   Procedure: COLONOSCOPY WITH PROPOFOL;  Surgeon: Lucilla Lame, MD;  Location: [facility] ENDOSCOPY;  Service: Endoscopy;  Laterality: N/A;   CORONARY ANGIOPLASTY     CYSTOSCOPY WITH BIOPSY N/A 11/30/2021   Procedure: CYSTOSCOPY WITH BLADDER BIOPSY;  Surgeon: Hollice Espy, MD;  Location: [facility] ORS;  Service: Urology;  Laterality: N/A;   ESOPHAGOGASTRODUODENOSCOPY (EGD) WITH PROPOFOL N/A 04/23/2016   Procedure: ESOPHAGOGASTRODUODENOSCOPY (EGD) WITH PROPOFOL;  Surgeon: Lucilla Lame, MD;  Location: [place];  Service: Endoscopy;  Laterality: N/A;  small bowel bx gastric antrum bx   SUBMANDIBULAR GLAND EXCISION Left 09/08/2017   Procedure: EXCISION SUBMANDIBULAR GLAND;  Surgeon: Carloyn Manner, MD;  Location: [facility] ORS;  Service: ENT;  Laterality: Left;   TOTAL ABDOMINAL HYSTERECTOMY W/ BILATERAL SALPINGOOPHORECTOMY     Total   Family History  Problem Relation Age of Onset   Arthritis Mother    Cancer Mother    Hypertension Sister    Asthma Brother    Dementia Brother    Breast cancer Neg Hx    Kidney cancer Neg Hx    Bladder Cancer Neg Hx    Social History   Socioeconomic History   Marital status: Divorced    Spouse name: Not on file   Number of children: Not on file   Years of education: 12   Highest education level: 12th grade  Occupational History   Occupation: disability   Tobacco Use   Smoking status: Former    Types: Cigarettes    Quit date: 1978    Years since quitting: 46.1    Smokeless tobacco: Never   Tobacco comments:    didnt smoke much   Vaping Use   Vaping Use: Never used  Substance and Sexual Activity   Alcohol use: Yes    Alcohol/week: 1.0 standard drink of alcohol    Types: 1 Glasses of wine per week    Comment: occasionally   Drug use: No   Sexual activity: Not Currently  Other Topics Concern   Not on file  Social History Narrative   Not on file   Social Determinants of Health   Financial Resource Strain: Low Risk  (11/16/2022)   Overall Financial Resource Strain (CARDIA)    Difficulty of Paying Living Expenses: Not hard at all  Food Insecurity: No Food Insecurity (11/16/2022)   Hunger Vital Sign  Worried About Charity fundraiser in the Last Year: Never true    [place] in the Last Year: Never true  Transportation Needs: No Transportation Needs (11/16/2022)   PRAPARE - Hydrologist (Medical): No    Lack of Transportation (Non-Medical): No  Physical Activity: Insufficiently Active (11/16/2022)   Exercise Vital Sign    Days of Exercise per Week: 3 days    Minutes of Exercise per Session: 30 min  Stress: No Stress Concern Present (11/16/2022)   Platter    Feeling of Stress : Not at all  Social Connections: Moderately Isolated (11/16/2022)   Social Connection and Isolation Panel [NHANES]    Frequency of Communication with Friends and Family: More than three times a week    Frequency of Social Gatherings with Friends and Family: Once a week    Attends Religious Services: 1 to 4 times per year    Active Member of [employer] or Organizations: No    Attends Music therapist: Never    Marital Status: Divorced    Tobacco Counseling Counseling given: Not Answered Tobacco comments: didnt smoke much    Clinical Intake:  Pre-visit preparation completed: Yes  Pain : No/denies pain     Nutritional Risks: None Diabetes:  No  How often do you need to have someone help you when you read instructions, pamphlets, or other written materials from your doctor or pharmacy?: 1 - Never  Diabetic?no  Interpreter Needed?: No  Information entered by :: Kirke Shaggy, LPN   Activities of Daily Living    11/16/2022    9:21 AM 11/27/2021    1:09 PM  In your present state of health, do you have any difficulty performing the following activities:  Hearing? 0   Vision? 0   Difficulty concentrating or making decisions? 0   Walking or climbing stairs? 0   Dressing or bathing? 0   Doing errands, shopping? 0 0  Preparing Food and eating ? N   Using the Toilet? N   In the past six months, have you accidently leaked urine? N   Do you have problems with loss of bowel control? N   Managing your Medications? N   Managing your Finances? N   Housekeeping or managing your Housekeeping? N     Patient Care Team: Valerie Roys, DO as PCP - General (Family Medicine) Ubaldo Glassing Javier Docker, MD as Consulting Physician (Cardiology) Carloyn Manner, MD as Referring Physician (Otolaryngology) Minor, Dalbert Garnet, RN (Inactive) as [place] Management Rogue Bussing, Elisha Headland, MD as Consulting Physician (Hematology and Oncology)  Indicate any recent Medical Services you may have received from other than [facility] providers in the past year (date may be approximate).     Assessment:   This is a routine wellness examination for [place].  Hearing/Vision screen Hearing Screening - Comments:: no Vision Screening - Comments:: Readers-   Dietary issues and exercise activities discussed: Current Exercise Habits: Home exercise routine, Type of exercise: stretching, Time (Minutes): 30, Frequency (Times/Week): 3, Weekly Exercise (Minutes/Week): 90   Goals Addressed             This Visit's Progress    DIET - EAT MORE FRUITS AND VEGETABLES         Depression Screen    11/16/2022    9:18 AM 08/09/2022   11:04 AM  02/04/2022   10:59 AM 01/29/2022   11:18 AM  11/24/2021   11:25 AM 11/09/2021   10:02 AM 11/07/2020    9:48 AM  PHQ 2/9 Scores  PHQ - 2 Score 0 0 0 0 2 0 0  PHQ- 9 Score 0 0 2 2 4      $ Fall Risk    11/16/2022    9:21 AM 08/09/2022   11:04 AM 01/29/2022   11:18 AM 11/24/2021   11:24 AM 11/09/2021   10:04 AM  Fall Risk   Falls in the past year? 0 0 0 0 0  Number falls in past yr: 0 0 0 0 0  Injury with Fall? 0 0 0 0 0  Risk for fall due to : No Fall Risks No Fall Risks No Fall Risks No Fall Risks   Follow up Falls prevention discussed;Falls evaluation completed Falls evaluation completed Falls evaluation completed Falls evaluation completed Falls evaluation completed;Falls prevention discussed    FALL RISK PREVENTION PERTAINING TO THE HOME:  Any stairs in or around the home? Yes  If so, are there any without handrails? No  Home free of loose throw rugs in walkways, pet beds, electrical cords, etc? Yes  Adequate lighting in your home to reduce risk of falls? No   ASSISTIVE DEVICES UTILIZED TO PREVENT FALLS:  Life alert? No  Use of a cane, walker or w/c? No  Grab bars in the bathroom? Yes  Shower chair or bench in shower? Yes  Elevated toilet seat or a handicapped toilet? No    Cognitive Function:        11/16/2022    9:25 AM 11/07/2020    9:49 AM 10/16/2018   10:08 AM 10/03/2017    4:06 PM 08/11/2016    8:26 AM  6CIT Screen  What Year? 0 points 4 points 0 points 0 points 0 points  What month? 0 points 0 points 0 points 0 points 0 points  What time? 0 points 0 points 0 points 0 points 0 points  Count back from 20 0 points 2 points 0 points 0 points 2 points  Months in reverse 2 points 4 points 0 points 0 points 0 points  Repeat phrase 4 points 6 points 4 points 0 points 2 points  Total Score 6 points 16 points 4 points 0 points 4 points    Immunizations Immunization History  Administered Date(s) Administered   Influenza,inj,Quad PF,6+ Mos 06/25/2015, 06/16/2017,  06/09/2018, 07/08/2019   Influenza-Unspecified 06/29/2016, 05/28/2020, 07/11/2022   Moderna Sars-Covid-2 Vaccination 11/15/2019, 12/13/2019   Pneumococcal Polysaccharide-23 07/29/2015   Td 01/27/2015    TDAP status: Up to date  Flu Vaccine status: Up to date  Pneumococcal vaccine status: Up to date  Covid-19 vaccine status: Completed vaccines  Qualifies for Shingles Vaccine? Yes   Zostavax completed No   Shingrix Completed?: No.    Education has been provided regarding the importance of this vaccine. Patient has been advised to call insurance company to determine out of pocket expense if they have not yet received this vaccine. Advised may also receive vaccine at local pharmacy or Health Dept. Verbalized acceptance and understanding.  Screening Tests Health Maintenance  Topic Date Due   Zoster Vaccines- Shingrix (1 of 2) Never done   COVID-19 Vaccine (3 - 2023-24 season) 05/28/2022   MAMMOGRAM  11/17/2023   Medicare Annual Wellness (AWV)  11/17/2023   DTaP/Tdap/Td (2 - Tdap) 01/26/2025   COLONOSCOPY (Pts 45-24yr Insurance coverage will need to be confirmed)  12/03/2027   INFLUENZA VACCINE  Completed   Hepatitis C  Screening  Completed   HIV Screening  Completed   HPV VACCINES  Aged Out    Health Maintenance  Health Maintenance Due  Topic Date Due   Zoster Vaccines- Shingrix (1 of 2) Never done   COVID-19 Vaccine (3 - 2023-24 season) 05/28/2022    Colorectal cancer screening: Type of screening: Colonoscopy. Completed 12/02/20. Repeat every 7 years  Mammogram status: Completed scheduled for 11/17/22. Repeat every year  Lung Cancer Screening: (Low Dose CT Chest recommended if Age 10-80 years, 30 pack-year currently smoking OR have quit w/in 15years.) does not qualify.    Additional Screening:  Hepatitis C Screening: does qualify; Completed 04/11/17  Vision Screening: Recommended annual ophthalmology exams for early detection of glaucoma and other disorders of the  eye. Is the patient up to date with their annual eye exam?  Yes  Who is the provider or what is the name of the office in which the patient attends annual eye exams? Doesn't remember name If pt is not established with a provider, would they like to be referred to a provider to establish care? No .   Dental Screening: Recommended annual dental exams for proper oral hygiene  Community Resource Referral / Chronic Care Management: CRR required this visit?  No   CCM required this visit?  No      Plan:     I have personally reviewed and noted the following in the patient's chart:   Medical and social history Use of alcohol, tobacco or illicit drugs  Current medications and supplements including opioid prescriptions. Patient is not currently taking opioid prescriptions. Functional ability and status Nutritional status Physical activity Advanced directives List of other physicians Hospitalizations, surgeries, and ER visits in previous 12 months Vitals Screenings to include cognitive, depression, and falls Referrals and appointments  In addition, I have reviewed and discussed with patient certain preventive protocols, quality metrics, and best practice recommendations. A written personalized care plan for preventive services as well as general preventive health recommendations were provided to patient.     Dionisio David, LPN   [id number]   Nurse Notes: none

## 2022-11-17 ENCOUNTER — Ambulatory Visit
Admission: RE | Admit: 2022-11-17 | Discharge: 2022-11-17 | Disposition: A | Discharge status: Home / Self Care | Payer: Medicare Other | Source: Ambulatory Visit | Attending: Family Medicine | Admitting: Family Medicine

## 2022-11-17 DIAGNOSIS — Z1231 Encounter for screening mammogram for malignant neoplasm of breast: Secondary | ICD-10-CM | POA: Insufficient documentation

## 2022-11-20 ENCOUNTER — Encounter: Payer: Self-pay | Admitting: Family Medicine

## 2022-12-09 ENCOUNTER — Encounter: Payer: Self-pay | Admitting: Physician Assistant

## 2022-12-09 ENCOUNTER — Ambulatory Visit (INDEPENDENT_AMBULATORY_CARE_PROVIDER_SITE_OTHER): Payer: Medicare Other | Admitting: Physician Assistant

## 2022-12-09 VITALS — BP 131/83 | HR 105 | Temp 97.7°F | Wt 171.4 lb

## 2022-12-09 DIAGNOSIS — J42 Unspecified chronic bronchitis: Secondary | ICD-10-CM

## 2022-12-09 DIAGNOSIS — R053 Chronic cough: Secondary | ICD-10-CM | POA: Diagnosis not present

## 2022-12-09 MED ORDER — PREDNISONE 20 MG PO TABS: 40.0000 mg | ORAL_TABLET | Freq: Every day | ORAL | 0 refills | Status: AC

## 2022-12-09 MED ORDER — ALBUTEROL SULFATE HFA 108 (90 BASE) MCG/ACT IN AERS: 2.0000 | INHALATION_SPRAY | Freq: Four times a day (QID) | RESPIRATORY_TRACT | 3 refills | Status: DC | PRN

## 2022-12-09 NOTE — Progress Notes (Signed)
Acute Office Visit   Patient: Kristina Henson   DOB: 1956/05/31   67 y.o. Female  MRN: [id number] Visit Date: 12/09/2022  Today's healthcare provider: Dani Gobble Atwell Mcdanel, PA-C  Introduced myself to the patient as a Journalist, newspaper and provided education on APPs in clinical practice.    Chief Complaint  Patient presents with   Cough    Pt states she started having a bad cough last week, states it has been productive. States the cough is a little bit better now but wanted to be checked before she goes to a funeral on Monday.    Subjective    HPI HPI     Cough    Additional comments: Pt states she started having a bad cough last week, states it has been productive. States the cough is a little bit better now but wanted to be checked before she goes to a funeral on Monday.       Last edited by Georgina Peer, CMA on 12/09/2022 10:02 AM.       Concern for Productive Cough  Onset: gradual  Duration: Since last week  Associated symptoms: she reports some rhinorrhea but this is resolved She denies sinus congestion, fatigue, malaise, fevers, chills,  Productive cough: she reports small amount of "yellow stuff coming out of my throat"  Recent sick contacts: none to her knowledge  Interventions: She has been using some Codeine cough medicine at night to help with sleep but cough is still present   SOB: no Wheezing: no Rescue inhaler use: she has not been using her rescue inhaler but has been using her Breztri inhaler 2-3 times per day  Reports some improvement in coughing with rescue inhaler use but still has coughing fits      Medications: Outpatient Medications Prior to Visit  Medication Sig   amiodarone (PACERONE) 200 MG tablet Take 1 tablet (200 mg total) by mouth every morning.   amLODipine (NORVASC) 2.5 MG tablet Take 1 tablet (2.5 mg total) by mouth daily.   aspirin EC 81 MG tablet Take 81 mg by mouth daily.    Budeson-Glycopyrrol-Formoterol (BREZTRI AEROSPHERE)  160-9-4.8 MCG/ACT AERO Inhale 2 puffs into the lungs 2 (two) times daily.   FEROSUL 325 (65 Fe) MG tablet TAKE 1 TABLET BY MOUTH THREE TIMES DAILY WITH MEALS   hydrALAZINE (APRESOLINE) 50 MG tablet Take 1 tablet (50 mg total) by mouth 2 (two) times daily.   lisinopril-hydrochlorothiazide (ZESTORETIC) 20-25 MG tablet Take 1 tablet by mouth 2 (two) times daily.   meloxicam (MOBIC) 7.5 MG tablet Take 1 tablet (7.5 mg total) by mouth daily.   traZODone (DESYREL) 50 MG tablet Take 1-2 tablets (50-100 mg total) by mouth at bedtime as needed for sleep.   [DISCONTINUED] albuterol (VENTOLIN HFA) 108 (90 Base) MCG/ACT inhaler Inhale 2 puffs into the lungs every 6 (six) hours as needed for wheezing or shortness of breath.   [DISCONTINUED] baclofen (LIORESAL) 10 MG tablet Take 1 tablet (10 mg total) by mouth at bedtime as needed for muscle spasms. (Patient not taking: Reported on 11/16/2022)   [DISCONTINUED] doxycycline (VIBRA-TABS) 100 MG tablet Take 1 tablet (100 mg total) by mouth 2 (two) times daily. (Patient not taking: Reported on 11/16/2022)   [DISCONTINUED] guaiFENesin-codeine 100-10 MG/5ML syrup Take 10 mLs by mouth 3 (three) times daily as needed for cough. (Patient not taking: Reported on 11/16/2022)   No facility-administered medications prior to visit.    Review of Systems  Constitutional:  Negative for chills, fatigue and fever.  HENT:  Positive for rhinorrhea. Negative for congestion, ear pain, sinus pressure, sinus pain and sore throat.   Eyes:  Negative for discharge and itching.  Respiratory:  Positive for cough. Negative for chest tightness, shortness of breath and wheezing.        Objective    BP 131/83   Pulse (!) 105   Temp 97.7 F (36.5 C) (Oral)   Wt 171 lb 6.4 oz (77.7 kg)   SpO2 97%   BMI 31.35 kg/m    Physical Exam Vitals reviewed.  Constitutional:      General: She is awake.     Appearance: Normal appearance. She is well-developed and well-groomed.  HENT:      Head: Normocephalic and atraumatic.  Cardiovascular:     Rate and Rhythm: Rhythm irregularly irregular.     Pulses:          Radial pulses are 2+ on the right side and 2+ on the left side.     Heart sounds: Normal heart sounds. No murmur heard.    No friction rub. No gallop.  Pulmonary:     Effort: Pulmonary effort is normal. Prolonged expiration present.     Breath sounds: Decreased air movement present. No decreased breath sounds, wheezing, rhonchi or rales.  Neurological:     Mental Status: She is alert.  Psychiatric:        Behavior: Behavior is cooperative.       No results found for any visits on 12/09/22.  Assessment & Plan      No follow-ups on file.      Problem List Items Addressed This Visit       Respiratory   COPD (chronic obstructive pulmonary disease) ([place])    Chronic, historic condition After reviewing medication use with patient, I do not believe that she is using inhalers appropriately. We reviewed that her Judithann Sauger should only be used twice per and is not for rescue therapy It does not appear as though she is using her rescue inhaler for acute SOB or coughing spells so we reviewed this and refills were sent in I am also starting a brief steroid burst to help with her coughing and recommend she starts a daily antihistamine to assist with potential allergy symptoms exacerbating her coughing and COPD.  We spent a significant amount of time on COPD disease education and what it means to have COPD (approx 10 extra minutes of apt time was dedicated to this)  Follow up as needed for persistent or progressing symptoms       Relevant Medications   albuterol (VENTOLIN HFA) 108 (90 Base) MCG/ACT inhaler   predniSONE (DELTASONE) 20 MG tablet   Other Visit Diagnoses     Chronic cough    -  Primary Acute flare Suspect this may be from allergies exacerbating chronic cough of COPD Will provide Prednisone burst and recommend starting antihistamine for allergy  coverage Follow up as needed for persistent or progressing symptoms     Relevant Medications   predniSONE (DELTASONE) 20 MG tablet        No follow-ups on file.   I, Seraphim Affinito E Prima Rayner, PA-C, have reviewed all documentation for this visit. The documentation on 12/10/22 for the exam, diagnosis, procedures, and orders are all accurate and complete.   Talitha Givens, MHS, PA-C [facility]

## 2022-12-09 NOTE — Patient Instructions (Addendum)
Make sure you are using your Breztri as directed= two puffs in the morning and two puffs in the evening  Make sure you are using your rescue inhaler (the Ventolin) for more severe coughing spells and trouble breathing   I also recommend taking an antihistamine like Allegra, Zyrtec or Claritin daily to help with allergies. You can get his over the counter

## 2022-12-10 DIAGNOSIS — J209 Acute bronchitis, unspecified: Secondary | ICD-10-CM | POA: Diagnosis not present

## 2022-12-10 DIAGNOSIS — I480 Paroxysmal atrial fibrillation: Secondary | ICD-10-CM | POA: Diagnosis not present

## 2022-12-10 DIAGNOSIS — I38 Endocarditis, valve unspecified: Secondary | ICD-10-CM | POA: Diagnosis not present

## 2022-12-10 DIAGNOSIS — J42 Unspecified chronic bronchitis: Secondary | ICD-10-CM | POA: Diagnosis not present

## 2022-12-10 DIAGNOSIS — I700 Atherosclerosis of aorta: Secondary | ICD-10-CM | POA: Diagnosis not present

## 2022-12-10 DIAGNOSIS — I10 Essential (primary) hypertension: Secondary | ICD-10-CM | POA: Diagnosis not present

## 2022-12-10 DIAGNOSIS — I639 Cerebral infarction, unspecified: Secondary | ICD-10-CM | POA: Diagnosis not present

## 2022-12-10 DIAGNOSIS — Z79899 Other long term (current) drug therapy: Secondary | ICD-10-CM | POA: Diagnosis not present

## 2022-12-10 DIAGNOSIS — E7801 Familial hypercholesterolemia: Secondary | ICD-10-CM | POA: Diagnosis not present

## 2022-12-10 NOTE — Assessment & Plan Note (Signed)
Chronic, historic condition After reviewing medication use with patient, I do not believe that she is using inhalers appropriately. We reviewed that her Kristina Henson should only be used twice per and is not for rescue therapy It does not appear as though she is using her rescue inhaler for acute SOB or coughing spells so we reviewed this and refills were sent in I am also starting a brief steroid burst to help with her coughing and recommend she starts a daily antihistamine to assist with potential allergy symptoms exacerbating her coughing and COPD.  We spent a significant amount of time on COPD disease education and what it means to have COPD (approx 10 extra minutes of apt time was dedicated to this)  Follow up as needed for persistent or progressing symptoms

## 2022-12-13 DIAGNOSIS — I38 Endocarditis, valve unspecified: Secondary | ICD-10-CM | POA: Insufficient documentation

## 2022-12-20 MED FILL — Iron Sucrose Inj 20 MG/ML (Fe Equiv): INTRAVENOUS | Qty: 10 | Status: AC

## 2022-12-21 ENCOUNTER — Inpatient Hospital Stay (HOSPITAL_BASED_OUTPATIENT_CLINIC_OR_DEPARTMENT_OTHER): Payer: Medicare Other | Admitting: Internal Medicine

## 2022-12-21 ENCOUNTER — Inpatient Hospital Stay: Payer: Medicare Other

## 2022-12-21 ENCOUNTER — Encounter: Payer: Self-pay | Admitting: Internal Medicine

## 2022-12-21 ENCOUNTER — Inpatient Hospital Stay: Payer: Medicare Other | Attending: Internal Medicine

## 2022-12-21 VITALS — BP 119/66 | HR 87 | Temp 98.6°F | Resp 17 | Wt 169.0 lb

## 2022-12-21 DIAGNOSIS — I10 Essential (primary) hypertension: Secondary | ICD-10-CM | POA: Insufficient documentation

## 2022-12-21 DIAGNOSIS — D500 Iron deficiency anemia secondary to blood loss (chronic): Secondary | ICD-10-CM

## 2022-12-21 DIAGNOSIS — E785 Hyperlipidemia, unspecified: Secondary | ICD-10-CM | POA: Diagnosis not present

## 2022-12-21 DIAGNOSIS — D696 Thrombocytopenia, unspecified: Secondary | ICD-10-CM | POA: Diagnosis not present

## 2022-12-21 DIAGNOSIS — D638 Anemia in other chronic diseases classified elsewhere: Secondary | ICD-10-CM

## 2022-12-21 DIAGNOSIS — Z79899 Other long term (current) drug therapy: Secondary | ICD-10-CM | POA: Diagnosis not present

## 2022-12-21 DIAGNOSIS — Z87891 Personal history of nicotine dependence: Secondary | ICD-10-CM | POA: Insufficient documentation

## 2022-12-21 DIAGNOSIS — Z7982 Long term (current) use of aspirin: Secondary | ICD-10-CM | POA: Diagnosis not present

## 2022-12-21 DIAGNOSIS — D649 Anemia, unspecified: Secondary | ICD-10-CM | POA: Insufficient documentation

## 2022-12-21 DIAGNOSIS — I4891 Unspecified atrial fibrillation: Secondary | ICD-10-CM | POA: Diagnosis not present

## 2022-12-21 LAB — BASIC METABOLIC PANEL
Anion gap: 9 (ref 5–15)
BUN: 22 mg/dL (ref 8–23)
CO2: 26 mmol/L (ref 22–32)
Calcium: 9 mg/dL (ref 8.9–10.3)
Chloride: 102 mmol/L (ref 98–111)
Creatinine, Ser: 0.92 mg/dL (ref 0.44–1.00)
GFR, Estimated: >60 mL/min (ref >60)
Glucose, Bld: 113 mg/dL — ABNORMAL HIGH (ref 70–99)
Potassium: 3.4 mmol/L — ABNORMAL LOW (ref 3.5–5.1)
Sodium: 137 mmol/L (ref 135–145)

## 2022-12-21 LAB — CBC WITH DIFFERENTIAL/PLATELET
Abs Immature Granulocytes: 0.04 10*3/uL (ref 0.00–0.07)
Basophils Absolute: 0 10*3/uL (ref 0.0–0.1)
Basophils Relative: 1 %
Eosinophils Absolute: 0.1 10*3/uL (ref 0.0–0.5)
Eosinophils Relative: 2 %
HCT: 38.6 % (ref 36.0–46.0)
Hemoglobin: 12.2 g/dL (ref 12.0–15.0)
Immature Granulocytes: 1 %
Lymphocytes Relative: 16 %
Lymphs Abs: 0.8 10*3/uL (ref 0.7–4.0)
MCH: 26.7 pg (ref 26.0–34.0)
MCHC: 31.6 g/dL (ref 30.0–36.0)
MCV: 84.5 fL (ref 80.0–100.0)
Monocytes Absolute: 0.5 10*3/uL (ref 0.1–1.0)
Monocytes Relative: 10 %
Neutro Abs: 3.4 10*3/uL (ref 1.7–7.7)
Neutrophils Relative %: 70 %
Platelets: 136 10*3/uL — ABNORMAL LOW (ref 150–400)
RBC: 4.57 MIL/uL (ref 3.87–5.11)
RDW: 17.2 % — ABNORMAL HIGH (ref 11.5–15.5)
WBC: 4.9 10*3/uL (ref 4.0–10.5)
nRBC: 0 % (ref 0.0–0.2)

## 2022-12-21 LAB — IRON AND TIBC
Iron: 44 ug/dL (ref 28–170)
Saturation Ratios: 12 % (ref 10.4–31.8)
TIBC: 375 ug/dL (ref 250–450)
UIBC: 331 ug/dL

## 2022-12-21 LAB — FERRITIN: Ferritin: 147 ng/mL (ref 11–307)

## 2022-12-21 NOTE — Progress Notes (Signed)
[facility] CONSULT NOTE  Patient Care Team: Valerie Roys, DO as PCP - General (Family Medicine) Ubaldo Glassing Javier Docker, MD as Consulting Physician (Cardiology) Carloyn Manner, MD as Referring Physician (Otolaryngology) Minor, Dalbert Garnet, RN (Inactive) as [facility] Rogue Bussing, Elisha Headland, MD as Consulting Physician (Hematology and Oncology)  CHIEF COMPLAINTS/PURPOSE OF CONSULTATION:   # Anemia of chronic disease/mild intermittent leucopenia/thromboctyopenia? Autoimmune- EGD/ [place] [Dr.Wohl; 2017] chronic back pain [? 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   HISTORY OF PRESENTING ILLNESS: Patient is alone.  Ambulating independently.  Kristina Henson 67 y.o.  female mild to moderate anemia/intermittent thrombocytopenia/leukopenia of Unclear etiology- is here for follow-up.  No blood in stools or black or stools.  She continues to be on p.o. iron once a day.  Tolerating well  Review of Systems  Constitutional:  Positive for malaise/fatigue. Negative for chills, diaphoresis, fever and weight loss.  HENT:  Negative for nosebleeds and sore throat.   Eyes:  Negative for double vision.  Respiratory:  Negative for cough, hemoptysis, sputum production, shortness of breath and wheezing.   Cardiovascular:  Negative for chest pain, palpitations, orthopnea and leg swelling.  Gastrointestinal:  Negative for abdominal pain, blood in stool, constipation, diarrhea, heartburn, melena, nausea and vomiting.  Genitourinary:  Negative for dysuria, frequency and urgency.  Musculoskeletal:  Positive for joint pain. Negative for back pain.  Skin: Negative.  Negative for itching and rash.  Neurological:  Negative for dizziness, tingling,  focal weakness, weakness and headaches.  Endo/Heme/Allergies:  Does not bruise/bleed easily.  Psychiatric/Behavioral:  Negative for depression. The patient is not nervous/anxious and does not have insomnia.      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   Atrial fibrillation ([place])    a.) CHA2DS2-VASc = 5 (sex, HTN, CVA x 2, aortic plaque). b.) s/p ablation in 2008. c.) rate/rhythm maintained on oral amiodarone; not currently on daily anticoagulation.   Coronary artery disease    Duodenitis    Gastritis    HLD (hyperlipidemia)    Hypertension    IDA (iron deficiency anemia)    LBBB (left bundle branch block)    Mass of left submandibular region 04/29/2016   a.) FNA 04/29/2016 --> Bx (+) for pleomorphic adenoma; s/p resection   PAH (pulmonary artery hypertension) ([place]) 07/24/2020   a.) CT 07/24/2020 showed enlarged pulmonary artery consistent with PAH.   Pneumonia 2016   Sleep apnea    Stroke [facility]) 2012    SURGICAL HISTORY: Past Surgical History:  Procedure Laterality Date   BIOPSY SALIVARY GLAND Left 04/29/2016   Procedure: SUBMANDIBULAR GLAND BIOPSY (FNA); Location: [facility]; Surgeon: Markus Daft, MD (IR)   cardiac catherization     CARDIAC ELECTROPHYSIOLOGY STUDY AND ABLATION N/A 2008   Procedure: ATRIAL FIBRILLATION ABLATION; Location: [facility]   COLONOSCOPY WITH PROPOFOL N/A 11/18/2015   Procedure: COLONOSCOPY WITH PROPOFOL;  Surgeon: Lucilla Lame, MD;  Location: [facility];  Service: Endoscopy;  Laterality: N/A;   COLONOSCOPY WITH PROPOFOL N/A 12/02/2020   Procedure: COLONOSCOPY WITH PROPOFOL;  Surgeon: Lucilla Lame, MD;  Location: [facility];  Service: Endoscopy;  Laterality: N/A;   CORONARY ANGIOPLASTY     CYSTOSCOPY WITH BIOPSY N/A 11/30/2021   Procedure: CYSTOSCOPY WITH  BLADDER BIOPSY;  Surgeon: Hollice Espy, MD;  Location: [facility];  Service: Urology;  Laterality: N/A;   ESOPHAGOGASTRODUODENOSCOPY (EGD) WITH PROPOFOL N/A 04/23/2016   Procedure: ESOPHAGOGASTRODUODENOSCOPY (EGD) WITH PROPOFOL;  Surgeon: Lucilla Lame, MD;  Location: [facility];  Service: Endoscopy;  Laterality: N/A;  small bowel bx gastric antrum bx   SUBMANDIBULAR GLAND EXCISION Left 09/08/2017   Procedure: EXCISION SUBMANDIBULAR GLAND;  Surgeon: Carloyn Manner, MD;  Location: [facility];  Service: ENT;  Laterality: Left;   TOTAL ABDOMINAL HYSTERECTOMY W/ BILATERAL SALPINGOOPHORECTOMY     Total    SOCIAL HISTORY: Social History   Socioeconomic History   Marital status: Divorced    Spouse name: Not on file   Number of children: Not on file   Years of education: 12   Highest education level: 12th grade  Occupational History   Occupation: disability   Tobacco Use   Smoking status: Former    Types: Cigarettes    Quit date: 1978    Years since quitting: 46.2   Smokeless tobacco: Never   Tobacco comments:    didnt smoke much   Vaping Use   Vaping Use: Never used  Substance and Sexual Activity   Alcohol use: Yes    Alcohol/week: 1.0 standard drink of alcohol    Types: 1 Glasses of wine per week    Comment: occasionally   Drug use: No   Sexual activity: Not Currently  Other Topics Concern   Not on file  Social History Narrative   Not on file   Social Determinants of Health   Financial Resource Strain: Low Risk  (11/16/2022)   Overall Financial Resource Strain (CARDIA)    Difficulty of Paying Living Expenses: Not hard at all  Food Insecurity: No Food Insecurity (11/16/2022)   Hunger Vital Sign    Worried About Running Out of Food in the Last Year: Never true    Ran Out of Food in the Last Year: Never true  Transportation Needs: No Transportation Needs (11/16/2022)   PRAPARE - Hydrologist (Medical): No    Lack of Transportation  (Non-Medical): No  Physical Activity: Insufficiently Active (11/16/2022)   Exercise Vital Sign    Days of Exercise per Week: 3 days    Minutes of Exercise per Session: 30 min  Stress: No Stress Concern Present (11/16/2022)   [place]    Feeling of Stress : Not at all  Social Connections: Moderately Isolated (11/16/2022)   Social Connection and Isolation Panel [NHANES]    Frequency of Communication with Friends and Family: More than three times a week    Frequency of Social Gatherings with Friends and Family: Once a week    Attends Religious Services: 1 to 4 times per year    Active Member of [employer] or Organizations: No    Attends Archivist Meetings: Never    Marital Status: Divorced  Human resources officer Violence: Not At Risk (11/16/2022)   Humiliation, Afraid, Rape, and Kick questionnaire    Fear of Current or Ex-Partner: No    Emotionally Abused: No    Physically Abused: No    Sexually Abused: No    FAMILY HISTORY: Family History  Problem Relation Age of Onset   Arthritis Mother    Cancer Mother  Hypertension Sister    Asthma Brother    Dementia Brother    Breast cancer Neg Hx    Kidney cancer Neg Hx    Bladder Cancer Neg Hx     ALLERGIES:  is allergic to amoxicillin.  MEDICATIONS:  Current Outpatient Medications  Medication Sig Dispense Refill   albuterol (VENTOLIN HFA) 108 (90 Base) MCG/ACT inhaler Inhale 2 puffs into the lungs every 6 (six) hours as needed for wheezing or shortness of breath. 8 g 3   amiodarone (PACERONE) 200 MG tablet Take 1 tablet (200 mg total) by mouth every morning. 30 tablet 0   amLODipine (NORVASC) 2.5 MG tablet Take 1 tablet (2.5 mg total) by mouth daily. 90 tablet 1   aspirin EC 81 MG tablet Take 81 mg by mouth daily.      Budeson-Glycopyrrol-Formoterol (BREZTRI AEROSPHERE) 160-9-4.8 MCG/ACT AERO Inhale 2 puffs into the lungs 2 (two) times daily. 10.7 g 11   FEROSUL  325 (65 Fe) MG tablet TAKE 1 TABLET BY MOUTH THREE TIMES DAILY WITH MEALS 270 tablet 0   hydrALAZINE (APRESOLINE) 50 MG tablet Take 1 tablet (50 mg total) by mouth 2 (two) times daily. 180 tablet 1   lisinopril-hydrochlorothiazide (ZESTORETIC) 20-25 MG tablet Take 1 tablet by mouth 2 (two) times daily. 180 tablet 1   meloxicam (MOBIC) 7.5 MG tablet Take 1 tablet (7.5 mg total) by mouth daily. 30 tablet 3   traZODone (DESYREL) 50 MG tablet Take 1-2 tablets (50-100 mg total) by mouth at bedtime as needed for sleep. 60 tablet 2   No current facility-administered medications for this visit.      Marland Kitchen  PHYSICAL EXAMINATION: ECOG PERFORMANCE STATUS: 1 - Symptomatic but completely ambulatory  Vitals:   12/21/22 1239  BP: 119/66  Pulse: 87  Resp: 17  Temp: 98.6 F (37 C)  SpO2: 99%   Filed Weights   12/21/22 1239  Weight: 169 lb (76.7 kg)    Physical Exam HENT:     Head: Normocephalic and atraumatic.     Mouth/Throat:     Pharynx: No oropharyngeal exudate.  Eyes:     Pupils: Pupils are equal, round, and reactive to light.  Cardiovascular:     Rate and Rhythm: Normal rate and regular rhythm.  Pulmonary:     Effort: No respiratory distress.     Breath sounds: No wheezing.  Abdominal:     General: Bowel sounds are normal. There is no distension.     Palpations: Abdomen is soft. There is no mass.     Tenderness: There is no abdominal tenderness. There is no guarding or rebound.  Musculoskeletal:        General: No tenderness. Normal range of motion.     Cervical back: Normal range of motion and neck supple.  Skin:    General: Skin is warm.  Neurological:     Mental Status: She is alert and oriented to person, place, and time.  Psychiatric:        Mood and Affect: Affect normal.      LABORATORY DATA:  I have reviewed the data as listed Lab Results  Component Value Date   WBC 4.9 12/21/2022   HGB 12.2 12/21/2022   HCT 38.6 12/21/2022   MCV 84.5 12/21/2022   PLT 136  (L) 12/21/2022   Recent Labs    02/04/22 1150 06/22/22 1416 08/09/22 1117 12/21/22 1233  NA 142 137 143 137  K 3.8 3.8 3.7 3.4*  CL 97 104 102 102  CO2 22 28 25 26   GLUCOSE 85 103* 111* 113*  BUN 30* 24* 18 22  CREATININE 0.96 0.93 0.90 0.92  CALCIUM 9.8 9.2 9.6 9.0  GFRNONAA  --  >60  --  >60  PROT 7.8  --  6.9  --   ALBUMIN 4.5  --  4.0  --   AST 23  --  14  --   ALT 28  --  17  --   ALKPHOS 77  --  82  --   BILITOT 0.7  --  0.7  --      RADIOGRAPHIC STUDIES: I have personally reviewed the radiological images as listed and agreed with the findings in the report. No results found.   ASSESSMENT & PLAN:  Anemia due to chronic illness # Anemia- likely IDA vs anemia of chronic disease  [unclear etiology]- s/p IV iron; improved.  Hemoglobin today is normal.  Iron panel is pending.  Continue with oral iron once a day.  Tolerating well.  No IV Venofer today.   #Mild asymptomatic leukopenia/intermittent neutropenia-?  Autoimmune process.  STABLE   #Mild intermittent thrombocytopenia-today 120 Stable. ? Autoimmune process.  STABLE   # DISPOSITION:  # no infusions today # follow up in 6 months-MD/cbc/bmp/iron studies/ferritin; possible Venofer-- Dr.B  Orders Placed This Encounter  Procedures   CBC with Differential/Platelet   Basic Metabolic Panel - [place] Only   Iron and TIBC   Ferritin    All questions were answered. The patient knows to call the clinic with any problems, questions or concerns.     Jane Canary, MD 12/21/2022 1:13 PM

## 2022-12-23 ENCOUNTER — Encounter: Payer: Self-pay | Admitting: Physician Assistant

## 2022-12-23 ENCOUNTER — Ambulatory Visit (INDEPENDENT_AMBULATORY_CARE_PROVIDER_SITE_OTHER): Payer: Medicare Other | Admitting: Physician Assistant

## 2022-12-23 VITALS — BP 125/79 | HR 94 | Temp 97.8°F | Ht 62.01 in | Wt 173.0 lb

## 2022-12-23 DIAGNOSIS — R350 Frequency of micturition: Secondary | ICD-10-CM | POA: Diagnosis not present

## 2022-12-23 DIAGNOSIS — N3001 Acute cystitis with hematuria: Secondary | ICD-10-CM | POA: Diagnosis not present

## 2022-12-23 LAB — URINALYSIS, ROUTINE W REFLEX MICROSCOPIC
Bilirubin, UA: NEGATIVE
Glucose, UA: NEGATIVE
Ketones, UA: NEGATIVE
Nitrite, UA: NEGATIVE
Specific Gravity, UA: 1.02 (ref 1.005–1.030)
Urobilinogen, Ur: 0.2 mg/dL (ref 0.2–1.0)
pH, UA: 5 (ref 5.0–7.5)

## 2022-12-23 LAB — MICROSCOPIC EXAMINATION

## 2022-12-23 MED ORDER — NITROFURANTOIN MONOHYD MACRO 100 MG PO CAPS: 100.0000 mg | ORAL_CAPSULE | Freq: Two times a day (BID) | ORAL | 0 refills | Status: AC

## 2022-12-23 NOTE — Progress Notes (Signed)
Acute Office Visit   Patient: Kristina Henson   DOB: 28-Dec-1955   67 y.o. Female  MRN: [id number] Visit Date: 12/23/2022  Today's healthcare provider: Dani Gobble Maciel Kegg, PA-C  Introduced myself to the patient as a Journalist, newspaper and provided education on APPs in clinical practice.    Chief Complaint  Patient presents with   Frequency of Urination    Urine is cloudy, started a few days ago    Subjective    HPI HPI     Frequency of Urination    Additional comments: Urine is cloudy, started a few days ago       Last edited by Jerelene Redden, CMA on 12/23/2022  8:46 AM.       Concern for Cloudy Urine   Onset: sudden  Duration: seemed to start around Tues or Wed   Associated symptoms: increased urinary frequency, urinary odor and urine appears cloudy She reports mild low back pain- indicated lumbar area when asked about flank pain   Interventions: none      Medications: Outpatient Medications Prior to Visit  Medication Sig   albuterol (VENTOLIN HFA) 108 (90 Base) MCG/ACT inhaler Inhale 2 puffs into the lungs every 6 (six) hours as needed for wheezing or shortness of breath.   amiodarone (PACERONE) 200 MG tablet Take 1 tablet (200 mg total) by mouth every morning.   amLODipine (NORVASC) 2.5 MG tablet Take 1 tablet (2.5 mg total) by mouth daily.   aspirin EC 81 MG tablet Take 81 mg by mouth daily.    Budeson-Glycopyrrol-Formoterol (BREZTRI AEROSPHERE) 160-9-4.8 MCG/ACT AERO Inhale 2 puffs into the lungs 2 (two) times daily.   FEROSUL 325 (65 Fe) MG tablet TAKE 1 TABLET BY MOUTH THREE TIMES DAILY WITH MEALS   hydrALAZINE (APRESOLINE) 50 MG tablet Take 1 tablet (50 mg total) by mouth 2 (two) times daily.   lisinopril-hydrochlorothiazide (ZESTORETIC) 20-25 MG tablet Take 1 tablet by mouth 2 (two) times daily.   meloxicam (MOBIC) 7.5 MG tablet Take 1 tablet (7.5 mg total) by mouth daily.   traZODone (DESYREL) 50 MG tablet Take 1-2 tablets (50-100 mg total) by mouth at  bedtime as needed for sleep.   No facility-administered medications prior to visit.    Review of Systems  Constitutional:  Negative for chills and fever.  Gastrointestinal:  Negative for abdominal pain, nausea and vomiting.  Genitourinary:  Positive for decreased urine volume and frequency. Negative for dysuria, hematuria, urgency, vaginal bleeding, vaginal discharge and vaginal pain.       Objective    BP 125/79   Pulse 94   Temp 97.8 F (36.6 C) (Oral)   Ht 5' 2.01" (1.575 m)   Wt 173 lb (78.5 kg)   SpO2 98%   BMI 31.63 kg/m    Physical Exam Vitals reviewed.  Constitutional:      General: She is awake.     Appearance: Normal appearance. She is well-developed and well-groomed.  HENT:     Head: Normocephalic and atraumatic.  Pulmonary:     Effort: Pulmonary effort is normal.  Abdominal:     General: Abdomen is protuberant. Bowel sounds are normal.     Palpations: Abdomen is soft.     Tenderness: There is right CVA tenderness. There is no left CVA tenderness, guarding or rebound.  Musculoskeletal:     Cervical back: Normal range of motion.  Neurological:     General: No focal deficit present.  Mental Status: She is alert and oriented to person, place, and time. Mental status is at baseline.  Psychiatric:        Mood and Affect: Mood normal.        Behavior: Behavior normal. Behavior is cooperative.        Thought Content: Thought content normal.        Judgment: Judgment normal.       Results for orders placed or performed in visit on 12/23/22  Microscopic Examination   Urine  Result Value Ref Range   WBC, UA 11-30 (A) 0 - 5 /hpf   RBC, Urine 0-2 0 - 2 /hpf   Epithelial Cells (non renal) 0-10 0 - 10 /hpf   Bacteria, UA Moderate (A) None seen/Few  Urinalysis, Routine w reflex microscopic  Result Value Ref Range   Specific Gravity, UA 1.020 1.005 - 1.030   pH, UA 5.0 5.0 - 7.5   Color, UA Yellow Yellow   Appearance Ur Cloudy (A) Clear   Leukocytes,UA  1+ (A) Negative   Protein,UA Trace (A) Negative/Trace   Glucose, UA Negative Negative   Ketones, UA Negative Negative   RBC, UA Trace (A) Negative   Bilirubin, UA Negative Negative   Urobilinogen, Ur 0.2 0.2 - 1.0 mg/dL   Nitrite, UA Negative Negative   Microscopic Examination See below:     Assessment & Plan      No follow-ups on file.      Problem List Items Addressed This Visit   None Visit Diagnoses     Acute cystitis with hematuria    -  Primary Acute, new concern Patient reports symptoms comprised of the following: cloudy, malodorous urine, increased frequency  Results of UA are consistent with UTI - urine sample sent for culture to determine causative organism and susceptibility- results to dictate further management  Results of UA were reviewed with patient in office  Recommend starting Macrobid 100 mg PO BID x 5 days pending urine culture results  Will provide script - discussed importance of finishing entire course of abx and staying well hydrated while recovering from UTI Reviewed ED precautions with patient Follow up as needed for persistent or worsening symptoms    Relevant Medications   nitrofurantoin, macrocrystal-monohydrate, (MACROBID) 100 MG capsule   Frequency of urination       Relevant Orders   Urine Culture   Urinalysis, Routine w reflex microscopic (Completed)        No follow-ups on file.   I, Ji Fairburn E Dakota Stangl, PA-C, have reviewed all documentation for this visit. The documentation on 12/23/22 for the exam, diagnosis, procedures, and orders are all accurate and complete.   Talitha Givens, MHS, PA-C [facility]

## 2022-12-29 LAB — URINE CULTURE

## 2022-12-30 MED ORDER — SULFAMETHOXAZOLE-TRIMETHOPRIM 800-160 MG PO TABS: 1.0000 | ORAL_TABLET | Freq: Two times a day (BID) | ORAL | 0 refills | Status: AC

## 2022-12-30 NOTE — Addendum Note (Signed)
Addended by: Talitha Givens on: 12/30/2022 12:34 PM   Modules accepted: Orders

## 2022-12-30 NOTE — Progress Notes (Signed)
Your urine culture shows that the bacteria that was causing your UTI is resistant to the antibiotic that we started at your apt. I have sent in a different antibiotic that I would like you to take instead to make sure we treat the infection adequately. Please stop the macrobid and start the bactrim at your earliest convenience.

## 2023-01-05 DIAGNOSIS — I480 Paroxysmal atrial fibrillation: Secondary | ICD-10-CM | POA: Diagnosis not present

## 2023-01-05 DIAGNOSIS — I38 Endocarditis, valve unspecified: Secondary | ICD-10-CM | POA: Diagnosis not present

## 2023-01-31 ENCOUNTER — Other Ambulatory Visit: Payer: Self-pay

## 2023-01-31 DIAGNOSIS — I77810 Thoracic aortic ectasia: Secondary | ICD-10-CM | POA: Insufficient documentation

## 2023-01-31 DIAGNOSIS — R9430 Abnormal result of cardiovascular function study, unspecified: Secondary | ICD-10-CM | POA: Diagnosis not present

## 2023-01-31 DIAGNOSIS — I700 Atherosclerosis of aorta: Secondary | ICD-10-CM

## 2023-01-31 DIAGNOSIS — I10 Essential (primary) hypertension: Secondary | ICD-10-CM | POA: Diagnosis not present

## 2023-01-31 DIAGNOSIS — D696 Thrombocytopenia, unspecified: Secondary | ICD-10-CM | POA: Diagnosis not present

## 2023-01-31 DIAGNOSIS — I4891 Unspecified atrial fibrillation: Secondary | ICD-10-CM

## 2023-01-31 DIAGNOSIS — E7801 Familial hypercholesterolemia: Secondary | ICD-10-CM | POA: Diagnosis not present

## 2023-01-31 DIAGNOSIS — Z8673 Personal history of transient ischemic attack (TIA), and cerebral infarction without residual deficits: Secondary | ICD-10-CM

## 2023-01-31 DIAGNOSIS — J811 Chronic pulmonary edema: Secondary | ICD-10-CM | POA: Diagnosis not present

## 2023-01-31 DIAGNOSIS — Z79899 Other long term (current) drug therapy: Secondary | ICD-10-CM | POA: Diagnosis not present

## 2023-01-31 DIAGNOSIS — I38 Endocarditis, valve unspecified: Secondary | ICD-10-CM | POA: Diagnosis not present

## 2023-02-03 DIAGNOSIS — D696 Thrombocytopenia, unspecified: Secondary | ICD-10-CM | POA: Diagnosis not present

## 2023-02-03 DIAGNOSIS — I4891 Unspecified atrial fibrillation: Secondary | ICD-10-CM | POA: Diagnosis not present

## 2023-02-07 ENCOUNTER — Encounter: Payer: Self-pay | Admitting: Family Medicine

## 2023-02-07 ENCOUNTER — Telehealth: Payer: Self-pay

## 2023-02-07 ENCOUNTER — Ambulatory Visit (INDEPENDENT_AMBULATORY_CARE_PROVIDER_SITE_OTHER): Payer: Medicare Other | Admitting: Family Medicine

## 2023-02-07 VITALS — BP 119/78 | HR 93 | Temp 98.5°F | Ht 62.01 in | Wt 173.3 lb

## 2023-02-07 DIAGNOSIS — J42 Unspecified chronic bronchitis: Secondary | ICD-10-CM

## 2023-02-07 DIAGNOSIS — E782 Mixed hyperlipidemia: Secondary | ICD-10-CM | POA: Diagnosis not present

## 2023-02-07 DIAGNOSIS — I129 Hypertensive chronic kidney disease with stage 1 through stage 4 chronic kidney disease, or unspecified chronic kidney disease: Secondary | ICD-10-CM

## 2023-02-07 DIAGNOSIS — I700 Atherosclerosis of aorta: Secondary | ICD-10-CM

## 2023-02-07 DIAGNOSIS — Z23 Encounter for immunization: Secondary | ICD-10-CM | POA: Diagnosis not present

## 2023-02-07 DIAGNOSIS — D638 Anemia in other chronic diseases classified elsewhere: Secondary | ICD-10-CM | POA: Diagnosis not present

## 2023-02-07 DIAGNOSIS — I4891 Unspecified atrial fibrillation: Secondary | ICD-10-CM | POA: Diagnosis not present

## 2023-02-07 MED ORDER — AMLODIPINE BESYLATE 2.5 MG PO TABS: 2.5000 mg | ORAL_TABLET | Freq: Every day | ORAL | 1 refills | Status: DC

## 2023-02-07 MED ORDER — TRAZODONE HCL 50 MG PO TABS: 50.0000 mg | ORAL_TABLET | Freq: Every evening | ORAL | 1 refills | Status: DC | PRN

## 2023-02-07 MED ORDER — BREZTRI AEROSPHERE 160-9-4.8 MCG/ACT IN AERO: 2.0000 | INHALATION_SPRAY | Freq: Two times a day (BID) | RESPIRATORY_TRACT | 11 refills | Status: DC

## 2023-02-07 MED ORDER — LISINOPRIL-HYDROCHLOROTHIAZIDE 20-25 MG PO TABS: 1.0000 | ORAL_TABLET | Freq: Two times a day (BID) | ORAL | 1 refills | Status: DC

## 2023-02-07 MED ORDER — FEROSUL 325 (65 FE) MG PO TABS: 325.0000 mg | ORAL_TABLET | Freq: Three times a day (TID) | ORAL | 1 refills | Status: DC

## 2023-02-07 MED ORDER — HYDRALAZINE HCL 50 MG PO TABS: 50.0000 mg | ORAL_TABLET | Freq: Two times a day (BID) | ORAL | 1 refills | Status: DC

## 2023-02-07 NOTE — Assessment & Plan Note (Signed)
Continue to follow with cardiology. Just started on crestor last week. Continue to keep BP and cholesterol under good control. Call with any concerns.

## 2023-02-07 NOTE — Progress Notes (Unsigned)
   Care Guide Note  02/07/2023 Name: Kristina Henson MRN: [id number] DOB: December 29, 1955  Referred by: Dorcas Carrow, DO Reason for referral : Care Coordination (Outreach to schedule with Pharm d )   Kristina Henson is a 67 y.o. year old female who is a primary care patient of Dorcas Carrow, DO. Kristina Henson was referred to the pharmacist for assistance related to Atrial Fibrillation.    An unsuccessful telephone outreach was attempted today to contact the patient who was referred to the pharmacy team for assistance with medication assistance. Additional attempts will be made to contact the patient.   Penne Lash, RMA Care Guide [facility]  [place], [place] [address] Direct Dial: [phone] [email or web address]

## 2023-02-07 NOTE — Assessment & Plan Note (Signed)
Being managed by cardiology. Has been started on warfarin due to cost, but doesn't want to keep getting blood draws. Will get pharmacy involved to see if we can help with the cost of a long acting- will let Cardiology know if something is approved. Continue current management for now. Call with any concerns.

## 2023-02-07 NOTE — Progress Notes (Signed)
BP 119/78   Pulse 93   Temp 98.5 F (36.9 C) (Oral)   Ht 5' 2.01" (1.575 m)   Wt 173 lb 4.8 oz (78.6 kg)   SpO2 97%   BMI 31.69 kg/m    Subjective:    Patient ID: Kristina Henson, female    DOB: 07-15-1956, 67 y.o.   MRN: [id number]  HPI: Kristina Henson is a 67 y.o. female  Chief Complaint  Patient presents with   Atrial Fibrillation   Hyperlipidemia   Heart Problem    Patient says since she was last seen in office. She has been seen by her Cardiologist and had three different test performed and was informed, she may have a blockage. Patient says she is scheduled to follow up Wednesday for blood work and testing.    HYPERTENSION / HYPERLIPIDEMIA Satisfied with current treatment? yes Duration of hypertension: chronic BP monitoring frequency: not checking BP medication side effects: no Past BP meds: hydralazine, lisinopril, HCTZ, amlodipine Duration of hyperlipidemia: chronic Cholesterol medication side effects: no Cholesterol supplements: none Past cholesterol medications: crestor Medication compliance: excellent compliance Aspirin: yes Recent stressors: no Recurrent headaches: no Visual changes: no Palpitations: no Dyspnea: no Chest pain: no Lower extremity edema: no Dizzy/lightheaded: no  COPD COPD status: controlled Satisfied with current treatment?: yes Oxygen use: no Dyspnea frequency: rarely Cough frequency: rarely Rescue inhaler frequency: rarely   Limitation of activity: no Productive cough: no Pneumovax: Up to Date Influenza: Up to Date  Relevant past medical, surgical, family and social history reviewed and updated as indicated. Interim medical history since our last visit reviewed. Allergies and medications reviewed and updated.  Review of Systems  Constitutional: Negative.   Respiratory: Negative.    Cardiovascular: Negative.   Gastrointestinal: Negative.   Musculoskeletal: Negative.   Neurological: Negative.   Psychiatric/Behavioral:  Negative.      Per HPI unless specifically indicated above     Objective:    BP 119/78   Pulse 93   Temp 98.5 F (36.9 C) (Oral)   Ht 5' 2.01" (1.575 m)   Wt 173 lb 4.8 oz (78.6 kg)   SpO2 97%   BMI 31.69 kg/m   Wt Readings from Last 3 Encounters:  02/07/23 173 lb 4.8 oz (78.6 kg)  12/23/22 173 lb (78.5 kg)  12/21/22 169 lb (76.7 kg)    Physical Exam Vitals and nursing note reviewed.  Constitutional:      General: She is not in acute distress.    Appearance: Normal appearance. She is not ill-appearing, toxic-appearing or diaphoretic.  HENT:     Head: Normocephalic and atraumatic.     Right Ear: External ear normal.     Left Ear: External ear normal.     Nose: Nose normal.     Mouth/Throat:     Mouth: Mucous membranes are moist.     Pharynx: Oropharynx is clear.  Eyes:     General: No scleral icterus.       Right eye: No discharge.        Left eye: No discharge.     Extraocular Movements: Extraocular movements intact.     Conjunctiva/sclera: Conjunctivae normal.     Pupils: Pupils are equal, round, and reactive to light.  Cardiovascular:     Rate and Rhythm: Normal rate and regular rhythm.     Pulses: Normal pulses.     Heart sounds: Normal heart sounds. No murmur heard.    No friction rub. No gallop.  Pulmonary:  Effort: Pulmonary effort is normal. No respiratory distress.     Breath sounds: Normal breath sounds. No stridor. No wheezing, rhonchi or rales.  Chest:     Chest wall: No tenderness.  Musculoskeletal:        General: Normal range of motion.     Cervical back: Normal range of motion and neck supple.  Skin:    General: Skin is warm and dry.     Capillary Refill: Capillary refill takes less than 2 seconds.     Coloration: Skin is not jaundiced or pale.     Findings: No bruising, erythema, lesion or rash.  Neurological:     General: No focal deficit present.     Mental Status: She is alert and oriented to person, place, and time. Mental status is  at baseline.  Psychiatric:        Mood and Affect: Mood normal.        Behavior: Behavior normal.        Thought Content: Thought content normal.        Judgment: Judgment normal.     Results for orders placed or performed in visit on 12/23/22  Urine Culture   Specimen: Urine   UR  Result Value Ref Range   Urine Culture, Routine Final report (A)    Organism ID, Bacteria Klebsiella aerogenes (A)    Antimicrobial Susceptibility Comment   Microscopic Examination   Urine  Result Value Ref Range   WBC, UA 11-30 (A) 0 - 5 /hpf   RBC, Urine 0-2 0 - 2 /hpf   Epithelial Cells (non renal) 0-10 0 - 10 /hpf   Bacteria, UA Moderate (A) None seen/Few  Urinalysis, Routine w reflex microscopic  Result Value Ref Range   Specific Gravity, UA 1.020 1.005 - 1.030   pH, UA 5.0 5.0 - 7.5   Color, UA Yellow Yellow   Appearance Ur Cloudy (A) Clear   Leukocytes,UA 1+ (A) Negative   Protein,UA Trace (A) Negative/Trace   Glucose, UA Negative Negative   Ketones, UA Negative Negative   RBC, UA Trace (A) Negative   Bilirubin, UA Negative Negative   Urobilinogen, Ur 0.2 0.2 - 1.0 mg/dL   Nitrite, UA Negative Negative   Microscopic Examination See below:       Assessment & Plan:   Problem List Items Addressed This Visit       Cardiovascular and Mediastinum   Atrial fibrillation (HCC) - Primary    Being managed by cardiology. Has been started on warfarin due to cost, but doesn't want to keep getting blood draws. Will get pharmacy involved to see if we can help with the cost of a long acting- will let Cardiology know if something is approved. Continue current management for now. Call with any concerns.       Relevant Medications   warfarin (COUMADIN) 2 MG tablet   rosuvastatin (CRESTOR) 5 MG tablet   amLODipine (NORVASC) 2.5 MG tablet   hydrALAZINE (APRESOLINE) 50 MG tablet   lisinopril-hydrochlorothiazide (ZESTORETIC) 20-25 MG tablet   Other Relevant Orders   AMB Referral to Pharmacy  Medication Management   Hardening of the aorta (main artery of the heart) (HCC)    Continue to follow with cardiology. Just started on crestor last week. Continue to keep BP and cholesterol under good control. Call with any concerns.       Relevant Medications   warfarin (COUMADIN) 2 MG tablet   rosuvastatin (CRESTOR) 5 MG tablet   amLODipine (NORVASC) 2.5  MG tablet   hydrALAZINE (APRESOLINE) 50 MG tablet   lisinopril-hydrochlorothiazide (ZESTORETIC) 20-25 MG tablet     Respiratory   COPD (chronic obstructive pulmonary disease) (HCC)    Under good control on current regimen. Continue current regimen. Continue to monitor. Call with any concerns. Refills given.        Relevant Medications   Budeson-Glycopyrrol-Formoterol (BREZTRI AEROSPHERE) 160-9-4.8 MCG/ACT AERO     Genitourinary   Benign hypertensive renal disease    Under good control on current regimen. Continue current regimen. Continue to monitor. Call with any concerns. Refills given. Labs to be drawn with cardiology in a month. Await their results to save her a stick.          Other   HLD (hyperlipidemia)    Just started on crestor last week. Continue to follow with cardiology. Labs to be drawn with cardiology in a month. Await their results to save her a stick.       Relevant Medications   warfarin (COUMADIN) 2 MG tablet   rosuvastatin (CRESTOR) 5 MG tablet   amLODipine (NORVASC) 2.5 MG tablet   hydrALAZINE (APRESOLINE) 50 MG tablet   lisinopril-hydrochlorothiazide (ZESTORETIC) 20-25 MG tablet   Anemia due to chronic illness    Under good control on current regimen. Continue current regimen. Continue to monitor. Call with any concerns. Refills given.        Relevant Medications   FEROSUL 325 (65 Fe) MG tablet   Other Visit Diagnoses     Need for vaccination against Streptococcus pneumoniae       Prevnar 20 given today.   Relevant Orders   Pneumococcal conjugate vaccine 20-valent (Prevnar 20)         Follow up plan: Return in about 6 months (around 08/10/2023).

## 2023-02-07 NOTE — Assessment & Plan Note (Signed)
Under good control on current regimen. Continue current regimen. Continue to monitor. Call with any concerns. Refills given.   

## 2023-02-07 NOTE — Assessment & Plan Note (Signed)
Under good control on current regimen. Continue current regimen. Continue to monitor. Call with any concerns. Refills given. Labs to be drawn with cardiology in a month. Await their results to save her a stick.

## 2023-02-07 NOTE — Assessment & Plan Note (Signed)
Just started on crestor last week. Continue to follow with cardiology. Labs to be drawn with cardiology in a month. Await their results to save her a stick.

## 2023-02-08 NOTE — Progress Notes (Signed)
   Care Guide Note  02/08/2023 Name: Kristina Henson MRN: [id number] DOB: 23-Aug-1956  Referred by: Dorcas Carrow, DO Reason for referral : Care Coordination (Outreach to schedule with Pharm d )   Kristina Henson is a 67 y.o. year old female who is a primary care patient of Dorcas Carrow, DO. Kristina Henson was referred to the pharmacist for assistance related to Atrial Fibrillation.    Successful contact was made with the patient to discuss pharmacy services including being ready for the pharmacist to call at least 5 minutes before the scheduled appointment time, to have medication bottles and any blood sugar or blood pressure readings ready for review. The patient agreed to meet with the pharmacist via with the pharmacist via telephone visit on (date/time).  02/10/2023  Kristina Henson, RMA Care Guide [facility]  [place], [place] [address] Direct Dial: [phone] [email or web address]

## 2023-02-09 DIAGNOSIS — R0600 Dyspnea, unspecified: Secondary | ICD-10-CM | POA: Diagnosis not present

## 2023-02-09 DIAGNOSIS — D696 Thrombocytopenia, unspecified: Secondary | ICD-10-CM | POA: Diagnosis not present

## 2023-02-09 DIAGNOSIS — R0689 Other abnormalities of breathing: Secondary | ICD-10-CM | POA: Diagnosis not present

## 2023-02-09 DIAGNOSIS — I4891 Unspecified atrial fibrillation: Secondary | ICD-10-CM | POA: Diagnosis not present

## 2023-02-09 DIAGNOSIS — R791 Abnormal coagulation profile: Secondary | ICD-10-CM | POA: Diagnosis not present

## 2023-02-10 ENCOUNTER — Other Ambulatory Visit: Payer: Medicare Other

## 2023-02-10 NOTE — Progress Notes (Signed)
   02/10/2023  Patient ID: Kristina Henson, female   DOB: [birth year], 66 y.o.   MRN: [id number]  Subjective/Objective: Telephone visit after referral received from PCP, Dr. Laural Benes, to evaluate affordability of warfarin alternatives for A-fib  Medication Management - Patient was started on warfarin in May for Afib and is followed by [facility] - Would prefer treatment that does not require frequent INR checks but concerned with affordability - Xarelto nor Eliquis appear to have been prescribed/filled in several years, but notes do reflect being on Xarelto at one time then switching to warfarin based on cost - Patient unable to verify household income to check eligibility for PAP  Assessment/Plan:  Medication Management - Contacted patient's Medicare part D plan, and  Xarelto or Eliquis would be $4.60 for 90 day supplies - These medications could cause patient to reach donut hole sooner; if that occurs, I recommend further evaluation for PAP - Providing information to Dr. Laural Benes, and one of [place] will communicate with [facility]  Lenna Gilford, PharmD, DPLA

## 2023-02-18 ENCOUNTER — Encounter: Payer: Self-pay | Admitting: Nurse Practitioner

## 2023-02-18 ENCOUNTER — Ambulatory Visit (INDEPENDENT_AMBULATORY_CARE_PROVIDER_SITE_OTHER): Payer: Medicare Other | Admitting: Nurse Practitioner

## 2023-02-18 VITALS — BP 130/74 | HR 91 | Temp 98.3°F | Wt 173.0 lb

## 2023-02-18 DIAGNOSIS — R300 Dysuria: Secondary | ICD-10-CM | POA: Diagnosis not present

## 2023-02-18 DIAGNOSIS — N3000 Acute cystitis without hematuria: Secondary | ICD-10-CM | POA: Diagnosis not present

## 2023-02-18 DIAGNOSIS — Z23 Encounter for immunization: Secondary | ICD-10-CM

## 2023-02-18 DIAGNOSIS — Z7901 Long term (current) use of anticoagulants: Secondary | ICD-10-CM

## 2023-02-18 LAB — URINALYSIS, ROUTINE W REFLEX MICROSCOPIC
Bilirubin, UA: NEGATIVE
Glucose, UA: NEGATIVE
Ketones, UA: NEGATIVE
Nitrite, UA: NEGATIVE
Protein,UA: NEGATIVE
RBC, UA: NEGATIVE
Specific Gravity, UA: 1.015 (ref 1.005–1.030)
Urobilinogen, Ur: 0.2 mg/dL (ref 0.2–1.0)
pH, UA: 5.5 (ref 5.0–7.5)

## 2023-02-18 LAB — WET PREP FOR TRICH, YEAST, CLUE
Clue Cell Exam: NEGATIVE
Trichomonas Exam: NEGATIVE
Yeast Exam: NEGATIVE

## 2023-02-18 LAB — MICROSCOPIC EXAMINATION: RBC, Urine: NONE SEEN /hpf (ref 0–2)

## 2023-02-18 MED ORDER — NITROFURANTOIN MONOHYD MACRO 100 MG PO CAPS: 100.0000 mg | ORAL_CAPSULE | Freq: Two times a day (BID) | ORAL | 0 refills | Status: DC

## 2023-02-18 NOTE — Progress Notes (Signed)
Results discussed with patient during visit.

## 2023-02-18 NOTE — Progress Notes (Signed)
BP 130/74   Pulse 91   Temp 98.3 F (36.8 C) (Oral)   Wt 173 lb (78.5 kg)   SpO2 98%   BMI 31.63 kg/m    Subjective:    Patient ID: Kristina Henson, female    DOB: 01/17/1956, 67 y.o.   MRN: [id number]  HPI: Kristina Henson is a 67 y.o. female  Chief Complaint  Patient presents with   Urinary Tract Infection   URINARY SYMPTOMS Symptoms started a couple days a go Dysuria: no Urinary frequency: yes Urgency: no Small volume voids: no Symptom severity: no Urinary incontinence: no Foul odor: no Hematuria: no Abdominal pain: no Back pain: yes Suprapubic pain/pressure: no Flank pain: yes Fever:  no Vomiting: no Relief with cranberry juice: no Relief with pyridium: no Status: stable Previous urinary tract infection: no Recurrent urinary tract infection: no Sexual activity: No sexually  Treatments attempted: none   Relevant past medical, surgical, family and social history reviewed and updated as indicated. Interim medical history since our last visit reviewed. Allergies and medications reviewed and updated.  Review of Systems  Constitutional:  Negative for fever.  Gastrointestinal:  Negative for abdominal pain and vomiting.  Genitourinary:  Positive for flank pain, frequency and urgency. Negative for decreased urine volume, dysuria and hematuria.  Musculoskeletal:  Negative for back pain.    Per HPI unless specifically indicated above     Objective:    BP 130/74   Pulse 91   Temp 98.3 F (36.8 C) (Oral)   Wt 173 lb (78.5 kg)   SpO2 98%   BMI 31.63 kg/m   Wt Readings from Last 3 Encounters:  02/18/23 173 lb (78.5 kg)  02/07/23 173 lb 4.8 oz (78.6 kg)  12/23/22 173 lb (78.5 kg)    Physical Exam Vitals and nursing note reviewed.  Constitutional:      General: She is not in acute distress.    Appearance: Normal appearance. She is normal weight. She is not ill-appearing, toxic-appearing or diaphoretic.  HENT:     Head: Normocephalic.     Right Ear:  External ear normal.     Left Ear: External ear normal.     Nose: Nose normal.     Mouth/Throat:     Mouth: Mucous membranes are moist.     Pharynx: Oropharynx is clear.  Eyes:     General:        Right eye: No discharge.        Left eye: No discharge.     Extraocular Movements: Extraocular movements intact.     Conjunctiva/sclera: Conjunctivae normal.     Pupils: Pupils are equal, round, and reactive to light.  Cardiovascular:     Rate and Rhythm: Normal rate and regular rhythm.     Heart sounds: No murmur heard. Pulmonary:     Effort: Pulmonary effort is normal. No respiratory distress.     Breath sounds: Normal breath sounds. No wheezing or rales.  Abdominal:     General: Abdomen is flat. Bowel sounds are normal. There is no distension.     Palpations: Abdomen is soft.     Tenderness: There is no abdominal tenderness. There is no right CVA tenderness, left CVA tenderness or guarding.  Musculoskeletal:     Cervical back: Normal range of motion and neck supple.  Skin:    General: Skin is warm and dry.     Capillary Refill: Capillary refill takes less than 2 seconds.  Neurological:  General: No focal deficit present.     Mental Status: She is alert and oriented to person, place, and time. Mental status is at baseline.  Psychiatric:        Mood and Affect: Mood normal.        Behavior: Behavior normal.        Thought Content: Thought content normal.        Judgment: Judgment normal.     Results for orders placed or performed in visit on 12/23/22  Urine Culture   Specimen: Urine   UR  Result Value Ref Range   Urine Culture, Routine Final report (A)    Organism ID, Bacteria Klebsiella aerogenes (A)    Antimicrobial Susceptibility Comment   Microscopic Examination   Urine  Result Value Ref Range   WBC, UA 11-30 (A) 0 - 5 /hpf   RBC, Urine 0-2 0 - 2 /hpf   Epithelial Cells (non renal) 0-10 0 - 10 /hpf   Bacteria, UA Moderate (A) None seen/Few  Urinalysis, Routine w  reflex microscopic  Result Value Ref Range   Specific Gravity, UA 1.020 1.005 - 1.030   pH, UA 5.0 5.0 - 7.5   Color, UA Yellow Yellow   Appearance Ur Cloudy (A) Clear   Leukocytes,UA 1+ (A) Negative   Protein,UA Trace (A) Negative/Trace   Glucose, UA Negative Negative   Ketones, UA Negative Negative   RBC, UA Trace (A) Negative   Bilirubin, UA Negative Negative   Urobilinogen, Ur 0.2 0.2 - 1.0 mg/dL   Nitrite, UA Negative Negative   Microscopic Examination See below:       Assessment & Plan:   Problem List Items Addressed This Visit   None Visit Diagnoses     Acute cystitis without hematuria    -  Primary   Will treat with macrobid.  UA showed +1 leuks.  Will send urine for culture. Increase water intake.  Follow up if not improved.   Relevant Orders   Urine Culture   Dysuria       Relevant Orders   Urinalysis, Routine w reflex microscopic   WET PREP FOR TRICH, YEAST, CLUE   Need for pneumococcal 20-valent conjugate vaccination       Relevant Orders   Pneumococcal conjugate vaccine 20-valent (Prevnar 20)        Follow up plan: Return if symptoms worsen or fail to improve.

## 2023-02-21 LAB — URINE CULTURE

## 2023-02-22 LAB — URINE CULTURE

## 2023-02-23 DIAGNOSIS — R791 Abnormal coagulation profile: Secondary | ICD-10-CM | POA: Diagnosis not present

## 2023-02-23 LAB — URINE CULTURE

## 2023-02-24 ENCOUNTER — Telehealth (HOSPITAL_COMMUNITY): Payer: Self-pay | Admitting: *Deleted

## 2023-02-24 MED ORDER — CIPROFLOXACIN HCL 500 MG PO TABS: 500.0000 mg | ORAL_TABLET | Freq: Two times a day (BID) | ORAL | 0 refills | Status: AC

## 2023-02-24 NOTE — Addendum Note (Signed)
Addended by: Larae Grooms on: 02/24/2023 10:38 AM   Modules accepted: Orders

## 2023-02-24 NOTE — Telephone Encounter (Signed)
Attempted to call patient regarding upcoming cardiac CT appointment. °Left message on voicemail with name and callback number ° °Dafney Farler RN Navigator Cardiac Imaging °[facility] °[phone] Office °[phone] Cell ° °

## 2023-02-24 NOTE — Progress Notes (Signed)
Please let patient know that we need to change her antibiotic.  I have sent in ciprofloxacin twice daily for 3 days.  She should stop taking the other antibiotic. She should come in on Monday and have her INR checked to make sure she is still within her normal range. This is just a lab visit.

## 2023-02-25 ENCOUNTER — Telehealth (HOSPITAL_COMMUNITY): Payer: Self-pay | Admitting: *Deleted

## 2023-02-25 NOTE — Telephone Encounter (Signed)
Second attempt to call patient regarding upcoming cardiac CT appointment. °Left message on voicemail with name and callback number ° °Rainie Crenshaw RN Navigator Cardiac Imaging °[facility] °[phone] Office °[phone] Cell ° ° °

## 2023-02-28 ENCOUNTER — Ambulatory Visit
Admission: RE | Admit: 2023-02-28 | Discharge: 2023-02-28 | Disposition: A | Discharge status: Home / Self Care | Payer: Medicare Other | Source: Ambulatory Visit | Attending: Internal Medicine | Admitting: Internal Medicine

## 2023-02-28 DIAGNOSIS — R9430 Abnormal result of cardiovascular function study, unspecified: Secondary | ICD-10-CM

## 2023-02-28 DIAGNOSIS — I4891 Unspecified atrial fibrillation: Secondary | ICD-10-CM

## 2023-02-28 DIAGNOSIS — E7801 Familial hypercholesterolemia: Secondary | ICD-10-CM

## 2023-02-28 DIAGNOSIS — I700 Atherosclerosis of aorta: Secondary | ICD-10-CM

## 2023-02-28 DIAGNOSIS — Z8673 Personal history of transient ischemic attack (TIA), and cerebral infarction without residual deficits: Secondary | ICD-10-CM

## 2023-03-01 ENCOUNTER — Other Ambulatory Visit (HOSPITAL_COMMUNITY): Payer: Self-pay | Admitting: *Deleted

## 2023-03-01 MED ORDER — METOPROLOL TARTRATE 100 MG PO TABS: ORAL_TABLET | ORAL | 0 refills | Status: DC

## 2023-03-01 MED ORDER — DILTIAZEM HCL 60 MG PO TABS: ORAL_TABLET | ORAL | 0 refills | Status: DC

## 2023-03-02 ENCOUNTER — Encounter: Payer: Self-pay | Admitting: Internal Medicine

## 2023-03-03 ENCOUNTER — Other Ambulatory Visit: Payer: Medicare HMO

## 2023-03-03 DIAGNOSIS — Z7901 Long term (current) use of anticoagulants: Secondary | ICD-10-CM | POA: Diagnosis not present

## 2023-03-04 LAB — PROTIME-INR
INR: 2.7 — ABNORMAL HIGH (ref 0.9–1.2)
Prothrombin Time: 26.9 s — ABNORMAL HIGH (ref 9.1–12.0)

## 2023-03-04 NOTE — Progress Notes (Signed)
Please let patient know that her INR is within her range of 2-3.  She can continue with her normal dosing.

## 2023-03-09 DIAGNOSIS — I480 Paroxysmal atrial fibrillation: Secondary | ICD-10-CM | POA: Diagnosis not present

## 2023-03-09 DIAGNOSIS — Z7901 Long term (current) use of anticoagulants: Secondary | ICD-10-CM | POA: Diagnosis not present

## 2023-03-22 ENCOUNTER — Encounter: Payer: Self-pay | Admitting: Internal Medicine

## 2023-03-24 ENCOUNTER — Other Ambulatory Visit: Payer: Self-pay | Admitting: Nurse Practitioner

## 2023-03-24 DIAGNOSIS — I700 Atherosclerosis of aorta: Secondary | ICD-10-CM

## 2023-03-24 DIAGNOSIS — Z7901 Long term (current) use of anticoagulants: Secondary | ICD-10-CM | POA: Diagnosis not present

## 2023-03-24 DIAGNOSIS — R9430 Abnormal result of cardiovascular function study, unspecified: Secondary | ICD-10-CM

## 2023-03-24 DIAGNOSIS — I480 Paroxysmal atrial fibrillation: Secondary | ICD-10-CM | POA: Diagnosis not present

## 2023-03-24 DIAGNOSIS — Z8673 Personal history of transient ischemic attack (TIA), and cerebral infarction without residual deficits: Secondary | ICD-10-CM

## 2023-03-24 DIAGNOSIS — I4891 Unspecified atrial fibrillation: Secondary | ICD-10-CM

## 2023-03-24 DIAGNOSIS — E7801 Familial hypercholesterolemia: Secondary | ICD-10-CM

## 2023-04-04 ENCOUNTER — Telehealth (HOSPITAL_COMMUNITY): Payer: Self-pay | Admitting: Emergency Medicine

## 2023-04-04 ENCOUNTER — Encounter: Payer: Self-pay | Admitting: Internal Medicine

## 2023-04-04 NOTE — Telephone Encounter (Signed)
Reaching out to patient to offer assistance regarding upcoming cardiac imaging study; pt verbalizes understanding of appt date/time, parking situation and where to check in, pre-test NPO status and medications ordered, and verified current allergies; name and call back number provided for further questions should they arise Braydn Carneiro RN Navigator Cardiac Imaging [facility] [phone] office [phone] cell 

## 2023-04-06 ENCOUNTER — Ambulatory Visit
Admission: RE | Admit: 2023-04-06 | Discharge: 2023-04-06 | Disposition: A | Discharge status: Home / Self Care | Payer: Medicare HMO | Source: Ambulatory Visit | Attending: Internal Medicine | Admitting: Internal Medicine

## 2023-04-06 ENCOUNTER — Ambulatory Visit
Admission: RE | Admit: 2023-04-06 | Discharge: 2023-04-06 | Disposition: A | Discharge status: Home / Self Care | Payer: Medicare HMO | Source: Ambulatory Visit | Attending: Nurse Practitioner | Admitting: Nurse Practitioner

## 2023-04-06 DIAGNOSIS — Z8673 Personal history of transient ischemic attack (TIA), and cerebral infarction without residual deficits: Secondary | ICD-10-CM | POA: Insufficient documentation

## 2023-04-06 DIAGNOSIS — R9430 Abnormal result of cardiovascular function study, unspecified: Secondary | ICD-10-CM | POA: Diagnosis not present

## 2023-04-06 DIAGNOSIS — I700 Atherosclerosis of aorta: Secondary | ICD-10-CM | POA: Insufficient documentation

## 2023-04-06 DIAGNOSIS — E7801 Familial hypercholesterolemia: Secondary | ICD-10-CM | POA: Insufficient documentation

## 2023-04-06 DIAGNOSIS — I4891 Unspecified atrial fibrillation: Secondary | ICD-10-CM | POA: Diagnosis not present

## 2023-04-06 LAB — POCT I-STAT CREATININE: Creatinine, Ser: 1 mg/dL (ref 0.44–1.00)

## 2023-04-06 MED ORDER — DILTIAZEM HCL 25 MG/5ML IV SOLN
INTRAVENOUS | Status: AC
  Filled 2023-04-06: qty 5

## 2023-04-06 MED ORDER — DILTIAZEM HCL 25 MG/5ML IV SOLN
10.0000 mg | Freq: Once | INTRAVENOUS | Status: AC
  Administered 2023-04-06: 10 mg via INTRAVENOUS

## 2023-04-06 MED ORDER — NITROGLYCERIN 0.4 MG SL SUBL: 0.8000 mg | SUBLINGUAL_TABLET | Freq: Once | SUBLINGUAL | Status: DC

## 2023-04-06 MED ORDER — METOPROLOL TARTRATE 5 MG/5ML IV SOLN: 5.0000 mg | Freq: Once | INTRAVENOUS | Status: DC

## 2023-04-06 MED ORDER — IOHEXOL 350 MG/ML SOLN
80.0000 mL | Freq: Once | INTRAVENOUS | Status: AC | PRN
  Administered 2023-04-06: 80 mL via INTRAVENOUS

## 2023-04-06 MED ORDER — DILTIAZEM HCL-DEXTROSE 125-5 MG/125ML-% IV SOLN (PREMIX): 10.0000 mg/h | INTRAVENOUS | Status: DC

## 2023-04-06 NOTE — Progress Notes (Signed)
Patient in atrial fibrillation. Unable to complete Cardiac CT per verbal order from Dr. Azucena Cecil. Patient will reschedule. Nitroglycerin not given. Vital signs stable upon discharge to the lobby.

## 2023-04-14 ENCOUNTER — Encounter: Payer: Self-pay | Admitting: Physician Assistant

## 2023-04-14 ENCOUNTER — Ambulatory Visit (INDEPENDENT_AMBULATORY_CARE_PROVIDER_SITE_OTHER): Payer: Medicare HMO | Admitting: Physician Assistant

## 2023-04-14 VITALS — BP 119/83 | HR 90 | Ht 62.0 in | Wt 173.0 lb

## 2023-04-14 DIAGNOSIS — J449 Chronic obstructive pulmonary disease, unspecified: Secondary | ICD-10-CM | POA: Diagnosis not present

## 2023-04-14 DIAGNOSIS — R82998 Other abnormal findings in urine: Secondary | ICD-10-CM

## 2023-04-14 DIAGNOSIS — M545 Low back pain, unspecified: Secondary | ICD-10-CM | POA: Diagnosis not present

## 2023-04-14 DIAGNOSIS — N390 Urinary tract infection, site not specified: Secondary | ICD-10-CM | POA: Diagnosis not present

## 2023-04-14 DIAGNOSIS — Z87440 Personal history of urinary (tract) infections: Secondary | ICD-10-CM | POA: Diagnosis not present

## 2023-04-14 DIAGNOSIS — R310 Gross hematuria: Secondary | ICD-10-CM | POA: Diagnosis not present

## 2023-04-14 LAB — URINALYSIS, COMPLETE
Bilirubin, UA: NEGATIVE
Glucose, UA: NEGATIVE
Ketones, UA: NEGATIVE
Nitrite, UA: NEGATIVE
Protein,UA: NEGATIVE
RBC, UA: NEGATIVE
Specific Gravity, UA: 1.01 (ref 1.005–1.030)
Urobilinogen, Ur: 0.2 mg/dL (ref 0.2–1.0)
pH, UA: 5.5 (ref 5.0–7.5)

## 2023-04-14 LAB — MICROSCOPIC EXAMINATION
RBC, Urine: NONE SEEN /hpf (ref 0–2)
WBC, UA: >30 /hpf — AB (ref 0–5)

## 2023-04-14 MED ORDER — PREMARIN 0.625 MG/GM VA CREA
TOPICAL_CREAM | VAGINAL | 4 refills | Status: AC
Start: 2023-04-14 — End: ?

## 2023-04-14 MED ORDER — CEFUROXIME AXETIL 250 MG PO TABS
250.0000 mg | ORAL_TABLET | Freq: Two times a day (BID) | ORAL | 0 refills | Status: AC
Start: 2023-04-14 — End: 2023-04-19

## 2023-04-14 NOTE — Progress Notes (Signed)
04/14/2023 10:38 AM   Kristina Henson 05-May-1956 [id number]  CC: Chief Complaint  Patient presents with   Hematuria   HPI: Kristina Henson is a 67 y.o. 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   Today she reports frequency, low back pain, malodorous urine, and gross hematuria which began 3 days ago.  The gross hematuria has since resolved.  She denies fever, chills, nausea, and vomiting.  In-office UA today positive for 2+ leukocytes; urine microscopy with >30 WBCs/HPF and many bacteria.  PMH: Past Medical History:  Diagnosis Date   Aortic atherosclerosis (HCC)    Arthritis    BACK-LUMBAR   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   Atrial fibrillation (HCC)    a.) CHA2DS2-VASc = 5 (sex, HTN, CVA x 2, aortic plaque). b.) s/p ablation in 2008. c.) rate/rhythm maintained on oral amiodarone; not currently on daily anticoagulation.   Coronary artery disease    Duodenitis    Gastritis    HLD (hyperlipidemia)    Hypertension    IDA (iron deficiency anemia)    LBBB (left bundle branch block)    Mass of left submandibular region 04/29/2016   a.) FNA 04/29/2016 --> Bx (+) for pleomorphic adenoma; s/p resection   PAH (pulmonary artery hypertension) (HCC) 07/24/2020   a.) CT 07/24/2020 showed enlarged pulmonary artery consistent with PAH.   Pneumonia 2016   Sleep apnea    Stroke [facility]) 2012    Surgical History: Past Surgical History:  Procedure Laterality Date   BIOPSY SALIVARY GLAND Left 04/29/2016   Procedure: SUBMANDIBULAR GLAND BIOPSY (FNA); Location: [facility]; Surgeon: Richarda Overlie, MD (IR)   cardiac catherization     CARDIAC ELECTROPHYSIOLOGY STUDY AND ABLATION N/A 2008   Procedure:  ATRIAL FIBRILLATION ABLATION; Location: [facility]   COLONOSCOPY WITH PROPOFOL N/A 11/18/2015   Procedure: COLONOSCOPY WITH PROPOFOL;  Surgeon: Midge Minium, MD;  Location: [facility] ENDOSCOPY;  Service: Endoscopy;  Laterality: N/A;   COLONOSCOPY WITH PROPOFOL N/A 12/02/2020   Procedure: COLONOSCOPY WITH PROPOFOL;  Surgeon: Midge Minium, MD;  Location: [facility] ENDOSCOPY;  Service: Endoscopy;  Laterality: N/A;   CORONARY ANGIOPLASTY     CYSTOSCOPY WITH BIOPSY N/A 11/30/2021   Procedure: CYSTOSCOPY WITH BLADDER BIOPSY;  Surgeon: Vanna Scotland, MD;  Location: [facility] ORS;  Service: Urology;  Laterality: N/A;   ESOPHAGOGASTRODUODENOSCOPY (EGD) WITH PROPOFOL N/A 04/23/2016   Procedure: ESOPHAGOGASTRODUODENOSCOPY (EGD) WITH PROPOFOL;  Surgeon: Midge Minium, MD;  Location: [facility];  Service: Endoscopy;  Laterality: N/A;  small bowel bx gastric antrum bx   SUBMANDIBULAR GLAND EXCISION Left 09/08/2017   Procedure: EXCISION SUBMANDIBULAR GLAND;  Surgeon: Bud Face, MD;  Location: [facility] ORS;  Service: ENT;  Laterality: Left;   TOTAL ABDOMINAL HYSTERECTOMY W/ BILATERAL SALPINGOOPHORECTOMY     Total    Home Medications:  Allergies as of 04/14/2023       Reactions   Amoxicillin Other (See Comments)   Joint pains        Medication List        Accurate as of April 14, 2023 10:38 AM. If you have any questions, ask your nurse or doctor.          albuterol 108 (90 Base) MCG/ACT inhaler  Commonly known as: VENTOLIN HFA Inhale 2 puffs into the lungs every 6 (six) hours as needed for wheezing or shortness of breath.   amiodarone 200 MG tablet Commonly known as: PACERONE Take 1 tablet (200 mg total) by mouth every morning.   amLODipine 2.5 MG tablet Commonly known as: NORVASC Take 1 tablet (2.5 mg total) by mouth daily.   aspirin EC 81 MG tablet Take 81 mg by mouth daily.   Breztri Aerosphere 160-9-4.8 MCG/ACT Aero Generic drug:  Budeson-Glycopyrrol-Formoterol Inhale 2 puffs into the lungs 2 (two) times daily.   diltiazem 60 MG tablet Commonly known as: Cardizem Take tablet (60mg ) TWO hours prior to your cardiac CT scan.   FeroSul 325 (65 Fe) MG tablet Generic drug: ferrous sulfate Take 1 tablet (325 mg total) by mouth 3 (three) times daily with meals.   hydrALAZINE 50 MG tablet Commonly known as: APRESOLINE Take 1 tablet (50 mg total) by mouth 2 (two) times daily.   lisinopril-hydrochlorothiazide 20-25 MG tablet Commonly known as: ZESTORETIC Take 1 tablet by mouth 2 (two) times daily.   meloxicam 7.5 MG tablet Commonly known as: MOBIC Take 1 tablet (7.5 mg total) by mouth daily.   metoprolol tartrate 25 MG tablet Commonly known as: LOPRESSOR Take 25 mg by mouth 2 (two) times daily.   metoprolol tartrate 100 MG tablet Commonly known as: LOPRESSOR Take tablet (100mg ) TWO hours prior to your cardiac CT scan.   nitrofurantoin (macrocrystal-monohydrate) 100 MG capsule Commonly known as: Macrobid Take 1 capsule (100 mg total) by mouth 2 (two) times daily.   rosuvastatin 5 MG tablet Commonly known as: CRESTOR Take by mouth.   traZODone 50 MG tablet Commonly known as: DESYREL Take 1-2 tablets (50-100 mg total) by mouth at bedtime as needed for sleep.   warfarin 2 MG tablet Commonly known as: COUMADIN Take by mouth.        Allergies:  Allergies  Allergen Reactions   Amoxicillin Other (See Comments)    Joint pains    Family History: Family History  Problem Relation Age of Onset   Arthritis Mother    Cancer Mother    Hypertension Sister    Asthma Brother    Dementia Brother    Breast cancer Neg Hx    Kidney cancer Neg Hx    Bladder Cancer Neg Hx     Social History:   reports that she quit smoking about 46 years ago. Her smoking use included cigarettes. She has never used smokeless tobacco. She reports current alcohol use of about 1.0 standard drink of alcohol per week. She reports  that she does not use drugs.  Physical Exam: BP 119/83   Pulse 90   Ht 5\' 2"  (1.575 m)   Wt 173 lb (78.5 kg)   BMI 31.64 kg/m   Constitutional:  Alert and oriented, no acute distress, nontoxic appearing HEENT: [place], AT Cardiovascular: No clubbing, cyanosis, or edema Respiratory: Normal respiratory effort, no increased work of breathing Skin: No rashes, bruises or suspicious lesions Neurologic: Grossly intact, no focal deficits, moving all 4 extremities Psychiatric: Normal mood and affect  Laboratory Data: Results for orders placed or performed in visit on 04/14/23  Microscopic Examination   Urine  Result Value Ref Range   WBC, UA >30 (A) 0 - 5 /hpf   RBC, Urine None seen 0 - 2 /hpf   Epithelial Cells (non renal) 0-10 0 - 10 /hpf   Bacteria, UA Many (A) None seen/Few  Urinalysis, Complete  Result Value  Ref Range   Specific Gravity, UA 1.010 1.005 - 1.030   pH, UA 5.5 5.0 - 7.5   Color, UA Yellow Yellow   Appearance Ur Cloudy (A) Clear   Leukocytes,UA 2+ (A) Negative   Protein,UA Negative Negative/Trace   Glucose, UA Negative Negative   Ketones, UA Negative Negative   RBC, UA Negative Negative   Bilirubin, UA Negative Negative   Urobilinogen, Ur 0.2 0.2 - 1.0 mg/dL   Nitrite, UA Negative Negative   Microscopic Examination See below:    Assessment & Plan:   1. Gross hematuria Now resolved, accompanied by irritative voiding symptoms, and in the setting of grossly infected appearing UA today.  Will start empiric cefuroxime and send for culture for further evaluation.  Will also send for urine cytology, if positive will pursue repeat hematuria workup.  We discussed return precautions including recurrent gross hematuria despite antibiotics, at which point we will pursue at least cystoscopy. - Urinalysis, Complete - CULTURE, URINE COMPREHENSIVE - Cytology - Non PAP; - cefUROXime (CEFTIN) 250 MG tablet; Take 1 tablet (250 mg total) by mouth 2 (two) times daily with a meal for 5  days.  Dispense: 10 tablet; Refill: 0  2. Recurrent UTI I recommended starting topical vaginal estrogen cream for UTI prevention.  We discussed the role of topical vaginal estrogen cream and reversing menopause associated genitourinary changes secondary to lost estrogen.  She is interested in trying this. - conjugated estrogens (PREMARIN) vaginal cream; Apply one pea-sized amount around the opening of the urethra daily for 2 weeks, then 3 times weekly moving forward.  Dispense: 30 g; Refill: 4   Return if symptoms worsen or fail to improve.  Carman Ching, PA-C  [facility] [address] [address] [place], [place] [address] [phone]

## 2023-04-19 LAB — CULTURE, URINE COMPREHENSIVE

## 2023-04-21 DIAGNOSIS — E782 Mixed hyperlipidemia: Secondary | ICD-10-CM | POA: Diagnosis not present

## 2023-04-21 DIAGNOSIS — I700 Atherosclerosis of aorta: Secondary | ICD-10-CM | POA: Diagnosis not present

## 2023-04-21 DIAGNOSIS — I639 Cerebral infarction, unspecified: Secondary | ICD-10-CM | POA: Diagnosis not present

## 2023-04-21 DIAGNOSIS — I480 Paroxysmal atrial fibrillation: Secondary | ICD-10-CM | POA: Diagnosis not present

## 2023-04-21 DIAGNOSIS — Z7901 Long term (current) use of anticoagulants: Secondary | ICD-10-CM | POA: Diagnosis not present

## 2023-04-21 DIAGNOSIS — I10 Essential (primary) hypertension: Secondary | ICD-10-CM | POA: Diagnosis not present

## 2023-04-21 DIAGNOSIS — I38 Endocarditis, valve unspecified: Secondary | ICD-10-CM | POA: Diagnosis not present

## 2023-04-22 ENCOUNTER — Telehealth: Payer: Self-pay

## 2023-04-22 NOTE — Telephone Encounter (Signed)
Pt verified by name and DOB, informed of results per sam "Urine cytology was negative for cancerous cells within the urine. She may keep follow up plans as discussed and should come back to see [place] if she keeps seeing blood in her urine despite antibiotics.". pt voiced understanding.

## 2023-05-25 DIAGNOSIS — Z7901 Long term (current) use of anticoagulants: Secondary | ICD-10-CM | POA: Diagnosis not present

## 2023-05-25 DIAGNOSIS — I480 Paroxysmal atrial fibrillation: Secondary | ICD-10-CM | POA: Diagnosis not present

## 2023-05-31 NOTE — Progress Notes (Signed)
06/01/2023 11:37 AM   Kristina Henson December 25, 1955 [id number]  Referring provider: Dorcas Carrow, DO [address] [place],  [place] [address]  Urological history: 1. rUTI -Contributing factors of age, vaginal atrophy and poor perineal hygiene -documented urine cultures over the last year  October 14, 2022 - E. Coli  Feb 18, 2023 - Klebsiella aerogenes   December 23, 2022 - Klebsiella aerogenes   September 07, 2022- E.Coli  June 08, 2022 - Klebsiella aerogenes, enterococcus faecalis -vaignal estrogen cream     2. 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  - CYSTITIS CYSTICA AND CYSTITIS GLANDULARIS, IN ALL SAMPLES. - NON-KERATINIZING SQUAMOUS METAPLASIA, MULTIPLE FOCI.  - NEGATIVE FOR MALIGNANCY   Chief Complaint  Patient presents with   Follow-up   HPI: Kristina Henson is a 67 y.o. female who presents today for a 12 month follow up.     Previous records reviewed.   She is having 1-7 daytime voids, 3 more episodes of nocturia with a mild urge to urinate.  She does not leak urine.  She limits fluid intake sometimes.  She does engage in toilet mapping sometimes.  Patient denies any modifying or aggravating factors.  Patient denies any gross hematuria, dysuria or suprapubic/flank pain.  Patient denies any fevers, chills, nausea or vomiting.   UA yellow cloudy, specific gravity 1.015, pH 5.5, 2+ leukocytes, greater than 30 WBCs, 0-10 RBCs, greater than 10 epithelial cells and many bacteria.  PVR 69 mL    PMH: Past Medical History:  Diagnosis Date   Aortic atherosclerosis (HCC)    Arthritis    BACK-LUMBAR   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   Atrial fibrillation (HCC)    a.) CHA2DS2-VASc = 5 (sex, HTN, CVA x 2, aortic plaque). b.) s/p ablation in 2008. c.) rate/rhythm maintained on oral amiodarone; not currently on daily anticoagulation.   Coronary artery disease    Duodenitis    Gastritis    HLD (hyperlipidemia)    Hypertension    IDA (iron deficiency anemia)    LBBB (left bundle branch block)    Mass of left submandibular region 04/29/2016   a.) FNA 04/29/2016 --> Bx (+) for pleomorphic adenoma; s/p resection   PAH (pulmonary artery hypertension) (HCC) 07/24/2020   a.) CT 07/24/2020 showed enlarged pulmonary artery consistent with PAH.   Pneumonia 2016   Sleep apnea    Stroke [facility]) 2012    Surgical History: Past Surgical History:  Procedure Laterality Date   BIOPSY SALIVARY GLAND Left 04/29/2016   Procedure: SUBMANDIBULAR GLAND BIOPSY (FNA); Location: [facility]; Surgeon: Richarda Overlie, MD (IR)   cardiac catherization     CARDIAC ELECTROPHYSIOLOGY STUDY AND ABLATION N/A 2008   Procedure: ATRIAL FIBRILLATION ABLATION; Location: [facility]   COLONOSCOPY WITH PROPOFOL N/A 11/18/2015   Procedure: COLONOSCOPY WITH PROPOFOL;  Surgeon: Midge Minium, MD;  Location: [facility] ENDOSCOPY;  Service: Endoscopy;  Laterality: N/A;   COLONOSCOPY WITH PROPOFOL N/A 12/02/2020  Procedure: COLONOSCOPY WITH PROPOFOL;  Surgeon: Midge Minium, MD;  Location: [facility] END[facility]COPY;  Service: Endoscopy;  Laterality: N/A;   CORONARY ANGIOPLASTY     CYSTOSCOPY WITH BIOPSY N/A 11/30/2021   Procedure: CYSTOSCOPY WITH BLADDER BIOPSY;  Surgeon: Vanna Scotland, MD;  Location: [facility];  Service: Urology;  Laterality: N/A;   ESOPHAGOGASTRODUODENOSCOPY (EGD) WITH PROPOFOL N/A 04/23/2016   Procedure: ESOPHAGOGASTRODUODENOSCOPY (EGD) WITH PROPOFOL;   Surgeon: Midge Minium, MD;  Location: [facility];  Service: Endoscopy;  Laterality: N/A;  small bowel bx gastric antrum bx   SUBMANDIBULAR GLAND EXCISION Left 09/08/2017   Procedure: EXCISION SUBMANDIBULAR GLAND;  Surgeon: Bud Face, MD;  Location: [facility];  Service: ENT;  Laterality: Left;   TOTAL ABDOMINAL HYSTERECTOMY W/ BILATERAL SALPINGOOPHORECTOMY     Total    Home Medications:  Allergies as of 06/01/2023       Reactions   Amoxicillin Other (See Comments)   Joint pains        Medication List        Accurate as of June 01, 2023 11:37 AM. If you have any questions, ask your nurse or doctor.          STOP taking these medications    aspirin EC 81 MG tablet   diltiazem 60 MG tablet Commonly known as: Cardizem       TAKE these medications    albuterol 108 (90 Base) MCG/ACT inhaler Commonly known as: VENTOLIN HFA Inhale 2 puffs into the lungs every 6 (six) hours as needed for wheezing or shortness of breath.   amiodarone 200 MG tablet Commonly known as: PACERONE Take 1 tablet (200 mg total) by mouth every morning.   amLODipine 2.5 MG tablet Commonly known as: NORVASC Take 1 tablet (2.5 mg total) by mouth daily.   Breztri Aerosphere 160-9-4.8 MCG/ACT Aero Generic drug: Budeson-Glycopyrrol-Formoterol Inhale 2 puffs into the lungs 2 (two) times daily.   FeroSul 325 (65 Fe) MG tablet Generic drug: ferrous sulfate Take 1 tablet (325 mg total) by mouth 3 (three) times daily with meals.   hydrALAZINE 50 MG tablet Commonly known as: APRESOLINE Take 1 tablet (50 mg total) by mouth 2 (two) times daily.   lisinopril-hydrochlorothiazide 20-25 MG tablet Commonly known as: ZESTORETIC Take 1 tablet by mouth 2 (two) times daily.   meloxicam 7.5 MG tablet Commonly known as: MOBIC Take 1 tablet (7.5 mg total) by mouth daily.   metoprolol tartrate 25 MG tablet Commonly known as: LOPRESSOR Take 25 mg by mouth 2 (two) times daily.   metoprolol  tartrate 100 MG tablet Commonly known as: LOPRESSOR Take tablet (100mg ) TWO hours prior to your cardiac CT scan.   nitrofurantoin (macrocrystal-monohydrate) 100 MG capsule Commonly known as: Macrobid Take 1 capsule (100 mg total) by mouth 2 (two) times daily.   Premarin vaginal cream Generic drug: conjugated estrogens Apply one pea-sized amount around the opening of the urethra daily for 2 weeks, then 3 times weekly moving forward.   rosuvastatin 5 MG tablet Commonly known as: CRESTOR Take by mouth.   traZODone 50 MG tablet Commonly known as: DESYREL Take 1-2 tablets (50-100 mg total) by mouth at bedtime as needed for sleep.   warfarin 2 MG tablet Commonly known as: COUMADIN Take by mouth.        Allergies:  Allergies  Allergen Reactions   Amoxicillin Other (See Comments)    Joint pains    Family History: Family History  Problem Relation Age of Onset   Arthritis Mother  Cancer Mother    Hypertension Sister    Asthma Brother    Dementia Brother    Breast cancer Neg Hx    Kidney cancer Neg Hx    Bladder Cancer Neg Hx     Social History:  reports that she quit smoking about 46 years ago. Her smoking use included cigarettes. She has never used smokeless tobacco. She reports current alcohol use of about 1.0 standard drink of alcohol per week. She reports that she does not use drugs.  ROS: Pertinent ROS in HPI  Physical Exam: BP (!) 154/92   Pulse 91   Ht 5\' 2"  (1.575 m)   Wt 173 lb (78.5 kg)   BMI 31.64 kg/m   Constitutional:  Well nourished. Alert and oriented, No acute distress. HEENT: [place] AT, moist mucus membranes.  Trachea midline Cardiovascular: No clubbing, cyanosis, or edema. Respiratory: Normal respiratory effort, no increased work of breathing. Neurologic: Grossly intact, no focal deficits, moving all 4 extremities. Psychiatric: Normal mood and affect.    Laboratory Data: I have reviewed the labs. See HPI.    Pertinent Imaging:  06/01/23  10:56  Scan Result 69 ml    Assessment & Plan:    1. High risk hematuria -work up in 2022 (CTU, cysto, urine cytology and bladder biopsy) benign -no reports of gross heme -UA negative for micro heme  2. rUTI's - criteria for recurrent UTI has been met with 2 or more infections in 6 months or 3 or greater infections in one year  - patient is instructed to increase their water intake until the urine is pale yellow or clear (10 to 12 cups daily)   3. Vaginal atrophy -I explained to the patient that when women go through menopause and her estrogen levels are severely diminished, the normal vaginal flora will change.  This is due to an increase of the vaginal canal's pH. Because of this, the vaginal canal may be colonized by bacteria from the rectum instead of the protective lactobacillus.  This, accompanied by the loss of the mucus barrier with vaginal atrophy, is a cause of recurrent urinary tract infections. -In some studies, the use of vaginal estrogen cream has been demonstrated to reduce  recurrent urinary tract infections to one a year.  -Patient will continue the vaginal estrogen cream, applying it 3 nights weekly                                              Return in about 1 year (around 05/31/2024) for UA, OAB, PVR .  These notes generated with voice recognition software. I apologize for typographical errors.  Cloretta Ned  [facility] [address]  [address] [place], [place] [address] [phone]

## 2023-06-01 ENCOUNTER — Encounter: Payer: Self-pay | Admitting: Urology

## 2023-06-01 ENCOUNTER — Ambulatory Visit: Payer: Medicare HMO | Admitting: Urology

## 2023-06-01 VITALS — BP 154/92 | HR 91 | Ht 62.0 in | Wt 173.0 lb

## 2023-06-01 DIAGNOSIS — N952 Postmenopausal atrophic vaginitis: Secondary | ICD-10-CM | POA: Diagnosis not present

## 2023-06-01 DIAGNOSIS — R319 Hematuria, unspecified: Secondary | ICD-10-CM | POA: Diagnosis not present

## 2023-06-01 DIAGNOSIS — N390 Urinary tract infection, site not specified: Secondary | ICD-10-CM

## 2023-06-01 DIAGNOSIS — Z87440 Personal history of urinary (tract) infections: Secondary | ICD-10-CM

## 2023-06-01 LAB — URINALYSIS, COMPLETE
Bilirubin, UA: NEGATIVE
Glucose, UA: NEGATIVE
Ketones, UA: NEGATIVE
Nitrite, UA: NEGATIVE
Protein,UA: NEGATIVE
RBC, UA: NEGATIVE
Specific Gravity, UA: 1.015 (ref 1.005–1.030)
Urobilinogen, Ur: 0.2 mg/dL (ref 0.2–1.0)
pH, UA: 5.5 (ref 5.0–7.5)

## 2023-06-01 LAB — MICROSCOPIC EXAMINATION
Epithelial Cells (non renal): >10 /HPF — AB (ref 0–10)
WBC, UA: >30 /HPF — AB (ref 0–5)

## 2023-06-01 LAB — BLADDER SCAN AMB NON-IMAGING: Scan Result: 69

## 2023-06-01 NOTE — Patient Instructions (Addendum)
  Apply 0.5mg  (pea-sized amount)  just inside the vaginal canal with a finger-tip on Monday, Wednesday and Friday nights.    Fingertip Application of Estrogen Cream Why? Vaginal estrogen creams deliver estrogen directly to the vagina with minimal absorption to the rest of  the body. This restores thickness to the vaginal skin as well as flexibility. Vaginal estrogen creams can  also normalize the vaginal environment decreasing the risk for urinary tract infections.  Apply estrogen cream to vaginal opening daily for two weeks then 2-3 times per week. How? 1. Wash your hands with soap and water and dry thoroughly. 2. Squeeze tube to express  gram of cream (enough to cover  of your index finger). 3. Locate the vaginal opening. Immediately above the vaginal opening is the urethra (a small  opening where urine is eliminated from your body). The urethra may not be as easily identified  as the vagina because the opening is much smaller; however, use the diagram to determine its  approximate location.  4. Carefully spread the cream into the vaginal/urethral area. As the cream is spread, make sure to  cover the opening and just inside of the vagina as this is where the majority of estrogen  receptors are located; however, it is not necessary to push the cream high into the vagina.

## 2023-06-02 DIAGNOSIS — Z7901 Long term (current) use of anticoagulants: Secondary | ICD-10-CM | POA: Diagnosis not present

## 2023-06-02 DIAGNOSIS — I480 Paroxysmal atrial fibrillation: Secondary | ICD-10-CM | POA: Diagnosis not present

## 2023-06-09 ENCOUNTER — Ambulatory Visit: Payer: Medicare Other | Admitting: Urology

## 2023-06-13 DIAGNOSIS — Z7901 Long term (current) use of anticoagulants: Secondary | ICD-10-CM | POA: Diagnosis not present

## 2023-06-13 DIAGNOSIS — I480 Paroxysmal atrial fibrillation: Secondary | ICD-10-CM | POA: Diagnosis not present

## 2023-06-16 DIAGNOSIS — I480 Paroxysmal atrial fibrillation: Secondary | ICD-10-CM | POA: Diagnosis not present

## 2023-06-16 DIAGNOSIS — Z7901 Long term (current) use of anticoagulants: Secondary | ICD-10-CM | POA: Diagnosis not present

## 2023-06-22 DIAGNOSIS — R791 Abnormal coagulation profile: Secondary | ICD-10-CM | POA: Diagnosis not present

## 2023-06-23 ENCOUNTER — Inpatient Hospital Stay: Payer: Medicare HMO | Attending: Internal Medicine

## 2023-06-23 DIAGNOSIS — D696 Thrombocytopenia, unspecified: Secondary | ICD-10-CM | POA: Diagnosis not present

## 2023-06-23 DIAGNOSIS — D649 Anemia, unspecified: Secondary | ICD-10-CM | POA: Insufficient documentation

## 2023-06-23 DIAGNOSIS — Z87891 Personal history of nicotine dependence: Secondary | ICD-10-CM | POA: Insufficient documentation

## 2023-06-23 DIAGNOSIS — D500 Iron deficiency anemia secondary to blood loss (chronic): Secondary | ICD-10-CM

## 2023-06-23 LAB — CBC WITH DIFFERENTIAL/PLATELET
Abs Immature Granulocytes: 0.01 10*3/uL (ref 0.00–0.07)
Basophils Absolute: 0 10*3/uL (ref 0.0–0.1)
Basophils Relative: 1 %
Eosinophils Absolute: 0 10*3/uL (ref 0.0–0.5)
Eosinophils Relative: 1 %
HCT: 35.7 % — ABNORMAL LOW (ref 36.0–46.0)
Hemoglobin: 11.3 g/dL — ABNORMAL LOW (ref 12.0–15.0)
Immature Granulocytes: 0 %
Lymphocytes Relative: 18 %
Lymphs Abs: 0.6 10*3/uL — ABNORMAL LOW (ref 0.7–4.0)
MCH: 27.2 pg (ref 26.0–34.0)
MCHC: 31.7 g/dL (ref 30.0–36.0)
MCV: 86 fL (ref 80.0–100.0)
Monocytes Absolute: 0.3 10*3/uL (ref 0.1–1.0)
Monocytes Relative: 10 %
Neutro Abs: 2.4 10*3/uL (ref 1.7–7.7)
Neutrophils Relative %: 70 %
Platelets: 117 10*3/uL — ABNORMAL LOW (ref 150–400)
RBC: 4.15 MIL/uL (ref 3.87–5.11)
RDW: 16.3 % — ABNORMAL HIGH (ref 11.5–15.5)
WBC: 3.5 10*3/uL — ABNORMAL LOW (ref 4.0–10.5)
nRBC: 0 % (ref 0.0–0.2)

## 2023-06-23 LAB — IRON AND TIBC
Iron: 46 ug/dL (ref 28–170)
Saturation Ratios: 14 % (ref 10.4–31.8)
TIBC: 321 ug/dL (ref 250–450)
UIBC: 275 ug/dL

## 2023-06-23 LAB — BASIC METABOLIC PANEL - CANCER CENTER ONLY
Anion gap: 8 (ref 5–15)
BUN: 23 mg/dL (ref 8–23)
CO2: 26 mmol/L (ref 22–32)
Calcium: 9 mg/dL (ref 8.9–10.3)
Chloride: 102 mmol/L (ref 98–111)
Creatinine: 0.85 mg/dL (ref 0.44–1.00)
GFR, Estimated: >60 mL/min (ref >60)
Glucose, Bld: 106 mg/dL — ABNORMAL HIGH (ref 70–99)
Potassium: 3.6 mmol/L (ref 3.5–5.1)
Sodium: 136 mmol/L (ref 135–145)

## 2023-06-23 LAB — FERRITIN: Ferritin: 143 ng/mL (ref 11–307)

## 2023-06-27 ENCOUNTER — Inpatient Hospital Stay: Payer: Medicare HMO

## 2023-06-27 ENCOUNTER — Encounter: Payer: Self-pay | Admitting: Internal Medicine

## 2023-06-27 ENCOUNTER — Inpatient Hospital Stay (HOSPITAL_BASED_OUTPATIENT_CLINIC_OR_DEPARTMENT_OTHER): Payer: Medicare HMO | Admitting: Internal Medicine

## 2023-06-27 VITALS — BP 135/81 | HR 99 | Temp 97.7°F | Ht 62.0 in | Wt 170.8 lb

## 2023-06-27 DIAGNOSIS — D638 Anemia in other chronic diseases classified elsewhere: Secondary | ICD-10-CM

## 2023-06-27 DIAGNOSIS — D696 Thrombocytopenia, unspecified: Secondary | ICD-10-CM | POA: Diagnosis not present

## 2023-06-27 DIAGNOSIS — D709 Neutropenia, unspecified: Secondary | ICD-10-CM

## 2023-06-27 DIAGNOSIS — Z87891 Personal history of nicotine dependence: Secondary | ICD-10-CM | POA: Diagnosis not present

## 2023-06-27 DIAGNOSIS — D649 Anemia, unspecified: Secondary | ICD-10-CM | POA: Diagnosis not present

## 2023-06-27 NOTE — Progress Notes (Signed)
Fatigue/weakness: no Dyspena: no  Light headedness: no Blood in stool: no  Had some heart studies and CT done at [facility] over the summer.

## 2023-06-27 NOTE — Assessment & Plan Note (Addendum)
#   Idiopathic cytopenia of undetermined significance [bone marrow biopsy-2021 mild dyspoiesis; SNP-no multiple abnormalities.]  #  Anemia- likely IDA vs anemia of chronic disease  [unclear etiology]- s/p IV iron; improved. Hb ~12.5; [ferritin140s; iron sat-14%- SEP 2024]; continue PO iron once a day.  Hold any IV iron. Stable. Order Intelli-Myeloid panel.   #Mild asymptomatic leukopenia/intermittent neutropenia/ Mild intermittent thrombocytopenia-today 120 - stable.   # DISPOSITION:  # no infusions today # follow up in 6 months-MD/cbc/bmp/iron studies/ferritin; possible Venofer-- Dr.B

## 2023-06-27 NOTE — Progress Notes (Signed)
[facility] CONSULT NOTE  Patient Care Team: Dorcas Carrow, DO as PCP - General (Family Medicine) Lady Gary Darlin Priestly, MD as Consulting Physician (Cardiology) Bud Face, MD as Referring Physician (Otolaryngology) Minor, Theadora Rama, RN (Inactive) as [facility] Care Management Donneta Romberg, Worthy Flank, MD as Consulting Physician (Hematology and Oncology)  CHIEF COMPLAINTS/PURPOSE OF CONSULTATION:   # Anemia of chronic disease/mild intermittent leucopenia/thromboctyopenia? Autoimmune- EGD/ Colo [Dr.Wohl; 2017] chronic back pain [? 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   HISTORY OF PRESENTING ILLNESS: Patient is alone.  Ambulating independently.  Kristina Henson 67 y.o.  female mild to moderate anemia/intermittent thrombocytopenia/leukopenia of Unclear etiology- is here for follow-up.  No blood in stools or black or stools.  She continues to be on p.o. iron once a day.  Denies any worsening fatigue or lightheadedness or falls.  Review of Systems  Constitutional:  Positive for malaise/fatigue. Negative for chills, diaphoresis, fever and weight loss.  HENT:  Negative for nosebleeds and sore throat.   Eyes:  Negative for double vision.  Respiratory:  Negative for cough, hemoptysis, sputum production, shortness of breath and wheezing.   Cardiovascular:  Negative for chest pain, palpitations, orthopnea and leg swelling.  Gastrointestinal:  Negative for abdominal pain, blood in stool, constipation, diarrhea, heartburn, melena, nausea and vomiting.  Genitourinary:  Negative for dysuria, frequency and urgency.  Musculoskeletal:  Positive for joint pain. Negative for back pain.  Skin: Negative.  Negative for itching and rash.   Neurological:  Negative for dizziness, tingling, focal weakness, weakness and headaches.  Endo/Heme/Allergies:  Does not bruise/bleed easily.  Psychiatric/Behavioral:  Negative for depression. The patient is not nervous/anxious and does not have insomnia.      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   Atrial fibrillation (HCC)    a.) CHA2DS2-VASc = 5 (sex, HTN, CVA x 2, aortic plaque). b.) s/p ablation in 2008. c.) rate/rhythm maintained on oral amiodarone; not currently on daily anticoagulation.   Coronary artery disease    Duodenitis    Gastritis    HLD (hyperlipidemia)    Hypertension    IDA (iron deficiency anemia)    LBBB (left bundle branch block)    Mass of left submandibular region 04/29/2016   a.) FNA 04/29/2016 --> Bx (+) for pleomorphic adenoma; s/p resection   PAH (pulmonary artery hypertension) (HCC) 07/24/2020   a.) CT 07/24/2020 showed enlarged pulmonary artery consistent with PAH.   Pneumonia 2016   Sleep apnea    Stroke [facility]) 2012    SURGICAL HISTORY: Past Surgical History:  Procedure Laterality Date   BIOPSY SALIVARY GLAND Left 04/29/2016   Procedure: SUBMANDIBULAR GLAND BIOPSY (FNA); Location: [facility]; Surgeon: Richarda Overlie, MD (IR)   cardiac catherization     CARDIAC ELECTROPHYSIOLOGY STUDY AND ABLATION N/A 2008   Procedure: ATRIAL FIBRILLATION ABLATION; Location: [facility]   COLONOSCOPY WITH PROPOFOL N/A 11/18/2015   Procedure: COLONOSCOPY WITH PROPOFOL;  Surgeon: Midge Minium, MD;  Location: [facility] ENDOSCOPY;  Service: Endoscopy;  Laterality: N/A;   COLONOSCOPY WITH PROPOFOL N/A 12/02/2020   Procedure: COLONOSCOPY WITH PROPOFOL;  Surgeon: Midge Minium, MD;  Location: [facility] ENDOSCOPY;  Service: Endoscopy;  Laterality: N/A;   CORONARY ANGIOPLASTY     CYSTOSCOPY WITH  BIOPSY N/A 11/30/2021   Procedure: CYSTOSCOPY WITH BLADDER BIOPSY;  Surgeon: Vanna Scotland, MD;  Location: [facility] ORS;  Service: Urology;  Laterality: N/A;   ESOPHAGOGASTRODUODENOSCOPY (EGD) WITH PROPOFOL N/A 04/23/2016   Procedure: ESOPHAGOGASTRODUODENOSCOPY (EGD) WITH PROPOFOL;  Surgeon: Midge Minium, MD;  Location: [facility];  Service: Endoscopy;  Laterality: N/A;  small bowel bx gastric antrum bx   SUBMANDIBULAR GLAND EXCISION Left 09/08/2017   Procedure: EXCISION SUBMANDIBULAR GLAND;  Surgeon: Bud Face, MD;  Location: [facility] ORS;  Service: ENT;  Laterality: Left;   TOTAL ABDOMINAL HYSTERECTOMY W/ BILATERAL SALPINGOOPHORECTOMY     Total    SOCIAL HISTORY: Social History   Socioeconomic History   Marital status: Divorced    Spouse name: Not on file   Number of children: Not on file   Years of education: 12   Highest education level: 12th grade  Occupational History   Occupation: disability   Tobacco Use   Smoking status: Former    Current packs/day: 0.00    Types: Cigarettes    Quit date: 1978    Years since quitting: 46.7   Smokeless tobacco: Never   Tobacco comments:    didnt smoke much   Vaping Use   Vaping status: Never Used  Substance and Sexual Activity   Alcohol use: Yes    Alcohol/week: 1.0 standard drink of alcohol    Types: 1 Glasses of wine per week    Comment: occasionally   Drug use: No   Sexual activity: Not Currently  Other Topics Concern   Not on file  Social History Narrative   Not on file   Social Determinants of Health   Financial Resource Strain: Low Risk  (11/16/2022)   Overall Financial Resource Strain (CARDIA)    Difficulty of Paying Living Expenses: Not hard at all  Food Insecurity: No Food Insecurity (11/16/2022)   Hunger Vital Sign    Worried About Running Out of Food in the Last Year: Never true    Ran Out of Food in the Last Year: Never true  Transportation Needs: No Transportation Needs (11/16/2022)   PRAPARE -  Administrator, Civil Service (Medical): No    Lack of Transportation (Non-Medical): No  Physical Activity: Insufficiently Active (11/16/2022)   Exercise Vital Sign    Days of Exercise per Week: 3 days    Minutes of Exercise per Session: 30 min  Stress: No Stress Concern Present (11/16/2022)   [employer] of Occupational Health - Occupational Stress Questionnaire    Feeling of Stress : Not at all  Social Connections: Moderately Isolated (11/16/2022)   Social Connection and Isolation Panel [NHANES]    Frequency of Communication with Friends and Family: More than three times a week    Frequency of Social Gatherings with Friends and Family: Once a week    Attends Religious Services: 1 to 4 times per year    Active Member of [employer] or Organizations: No    Attends Banker Meetings: Never    Marital Status: Divorced  Catering manager Violence: Not At Risk (11/16/2022)   Humiliation, Afraid, Rape, and Kick questionnaire    Fear of Current or Ex-Partner: No    Emotionally Abused: No    Physically Abused: No    Sexually Abused: No    FAMILY HISTORY: Family History  Problem Relation Age of  Onset   Arthritis Mother    Cancer Mother    Hypertension Sister    Asthma Brother    Dementia Brother    Breast cancer Neg Hx    Kidney cancer Neg Hx    Bladder Cancer Neg Hx     ALLERGIES:  is allergic to amoxicillin.  MEDICATIONS:  Current Outpatient Medications  Medication Sig Dispense Refill   albuterol (VENTOLIN HFA) 108 (90 Base) MCG/ACT inhaler Inhale 2 puffs into the lungs every 6 (six) hours as needed for wheezing or shortness of breath. 8 g 3   amiodarone (PACERONE) 200 MG tablet Take 1 tablet (200 mg total) by mouth every morning. 30 tablet 0   amLODipine (NORVASC) 2.5 MG tablet Take 1 tablet (2.5 mg total) by mouth daily. 90 tablet 1   Budeson-Glycopyrrol-Formoterol (BREZTRI AEROSPHERE) 160-9-4.8 MCG/ACT AERO Inhale 2 puffs into the lungs 2 (two) times  daily. 10.7 g 11   conjugated estrogens (PREMARIN) vaginal cream Apply one pea-sized amount around the opening of the urethra daily for 2 weeks, then 3 times weekly moving forward. 30 g 4   FEROSUL 325 (65 Fe) MG tablet Take 1 tablet (325 mg total) by mouth 3 (three) times daily with meals. 270 tablet 1   hydrALAZINE (APRESOLINE) 50 MG tablet Take 1 tablet (50 mg total) by mouth 2 (two) times daily. 180 tablet 1   lisinopril-hydrochlorothiazide (ZESTORETIC) 20-25 MG tablet Take 1 tablet by mouth 2 (two) times daily. 180 tablet 1   meloxicam (MOBIC) 7.5 MG tablet Take 1 tablet (7.5 mg total) by mouth daily. 30 tablet 3   metoprolol tartrate (LOPRESSOR) 100 MG tablet Take tablet (100mg ) TWO hours prior to your cardiac CT scan. 1 tablet 0   metoprolol tartrate (LOPRESSOR) 25 MG tablet Take 25 mg by mouth 2 (two) times daily.     nitrofurantoin, macrocrystal-monohydrate, (MACROBID) 100 MG capsule Take 1 capsule (100 mg total) by mouth 2 (two) times daily. 10 capsule 0   rosuvastatin (CRESTOR) 5 MG tablet Take by mouth.     traZODone (DESYREL) 50 MG tablet Take 1-2 tablets (50-100 mg total) by mouth at bedtime as needed for sleep. 180 tablet 1   warfarin (COUMADIN) 2 MG tablet Take by mouth.     No current facility-administered medications for this visit.      Marland Kitchen  PHYSICAL EXAMINATION: ECOG PERFORMANCE STATUS: 1 - Symptomatic but completely ambulatory  Vitals:   06/27/23 1245  BP: 135/81  Pulse: 99  Temp: 97.7 F (36.5 C)  SpO2: 94%   Filed Weights   06/27/23 1245  Weight: 170 lb 12.8 oz (77.5 kg)    Physical Exam HENT:     Head: Normocephalic and atraumatic.     Mouth/Throat:     Pharynx: No oropharyngeal exudate.  Eyes:     Pupils: Pupils are equal, round, and reactive to light.  Cardiovascular:     Rate and Rhythm: Normal rate and regular rhythm.  Pulmonary:     Effort: No respiratory distress.     Breath sounds: No wheezing.  Abdominal:     General: Bowel sounds are  normal. There is no distension.     Palpations: Abdomen is soft. There is no mass.     Tenderness: There is no abdominal tenderness. There is no guarding or rebound.  Musculoskeletal:        General: No tenderness. Normal range of motion.     Cervical back: Normal range of motion and neck supple.  Skin:  General: Skin is warm.  Neurological:     Mental Status: She is alert and oriented to person, place, and time.  Psychiatric:        Mood and Affect: Affect normal.      LABORATORY DATA:  I have reviewed the data as listed Lab Results  Component Value Date   WBC 3.5 (L) 06/23/2023   HGB 11.3 (L) 06/23/2023   HCT 35.7 (L) 06/23/2023   MCV 86.0 06/23/2023   PLT 117 (L) 06/23/2023   Recent Labs    08/09/22 1117 12/21/22 1233 04/06/23 1056 06/23/23 1044  NA 143 137  --  136  K 3.7 3.4*  --  3.6  CL 102 102  --  102  CO2 25 26  --  26  GLUCOSE 111* 113*  --  106*  BUN 18 22  --  23  CREATININE 0.90 0.92 1.00 0.85  CALCIUM 9.6 9.0  --  9.0  GFRNONAA  --  >60  --  >60  PROT 6.9  --   --   --   ALBUMIN 4.0  --   --   --   AST 14  --   --   --   ALT 17  --   --   --   ALKPHOS 82  --   --   --   BILITOT 0.7  --   --   --     RADIOGRAPHIC STUDIES: I have personally reviewed the radiological images as listed and agreed with the findings in the report. No results found.   ASSESSMENT & PLAN:   Anemia due to chronic illness # Idiopathic cytopenia of undetermined significance [bone marrow biopsy-2021 mild dyspoiesis; SNP-no multiple abnormalities.]  #  Anemia- likely IDA vs anemia of chronic disease  [unclear etiology]- s/p IV iron; improved. Hb ~12.5; [ferritin140s; iron sat-14%- SEP 2024]; continue PO iron once a day.  Hold any IV iron. Stable. Order Intelli-Myeloid panel.   #Mild asymptomatic leukopenia/intermittent neutropenia/ Mild intermittent thrombocytopenia-today 120 - stable.   # DISPOSITION:  # no infusions today # follow up in 6 months-MD/cbc/bmp/iron  studies/ferritin; possible Venofer-- Dr.B   All questions were answered. The patient knows to call the clinic with any problems, questions or concerns.     Earna Coder, MD 06/27/2023 1:39 PM

## 2023-07-07 DIAGNOSIS — R791 Abnormal coagulation profile: Secondary | ICD-10-CM | POA: Diagnosis not present

## 2023-07-22 DIAGNOSIS — R791 Abnormal coagulation profile: Secondary | ICD-10-CM | POA: Diagnosis not present

## 2023-07-25 DIAGNOSIS — R791 Abnormal coagulation profile: Secondary | ICD-10-CM | POA: Diagnosis not present

## 2023-08-02 DIAGNOSIS — R791 Abnormal coagulation profile: Secondary | ICD-10-CM | POA: Diagnosis not present

## 2023-08-10 ENCOUNTER — Encounter: Payer: Self-pay | Admitting: Family Medicine

## 2023-08-10 ENCOUNTER — Ambulatory Visit (INDEPENDENT_AMBULATORY_CARE_PROVIDER_SITE_OTHER): Payer: Medicare HMO | Admitting: Family Medicine

## 2023-08-10 VITALS — BP 133/81 | HR 89 | Ht 62.0 in | Wt 175.8 lb

## 2023-08-10 DIAGNOSIS — I4891 Unspecified atrial fibrillation: Secondary | ICD-10-CM | POA: Diagnosis not present

## 2023-08-10 DIAGNOSIS — L821 Other seborrheic keratosis: Secondary | ICD-10-CM | POA: Diagnosis not present

## 2023-08-10 DIAGNOSIS — D638 Anemia in other chronic diseases classified elsewhere: Secondary | ICD-10-CM

## 2023-08-10 DIAGNOSIS — L723 Sebaceous cyst: Secondary | ICD-10-CM

## 2023-08-10 DIAGNOSIS — E782 Mixed hyperlipidemia: Secondary | ICD-10-CM

## 2023-08-10 DIAGNOSIS — Z1231 Encounter for screening mammogram for malignant neoplasm of breast: Secondary | ICD-10-CM | POA: Diagnosis not present

## 2023-08-10 DIAGNOSIS — Z23 Encounter for immunization: Secondary | ICD-10-CM

## 2023-08-10 DIAGNOSIS — Z0000 Encounter for general adult medical examination without abnormal findings: Secondary | ICD-10-CM

## 2023-08-10 DIAGNOSIS — I129 Hypertensive chronic kidney disease with stage 1 through stage 4 chronic kidney disease, or unspecified chronic kidney disease: Secondary | ICD-10-CM | POA: Diagnosis not present

## 2023-08-10 LAB — MICROALBUMIN, URINE WAIVED
Creatinine, Urine Waived: 50 mg/dL (ref 10–300)
Microalb, Ur Waived: 80 mg/L — ABNORMAL HIGH (ref 0–19)

## 2023-08-10 MED ORDER — METOPROLOL TARTRATE 25 MG PO TABS: 25.0000 mg | ORAL_TABLET | Freq: Two times a day (BID) | ORAL | 1 refills | Status: DC

## 2023-08-10 MED ORDER — LISINOPRIL-HYDROCHLOROTHIAZIDE 20-25 MG PO TABS: 1.0000 | ORAL_TABLET | Freq: Two times a day (BID) | ORAL | 1 refills | Status: DC

## 2023-08-10 MED ORDER — FEROSUL 325 (65 FE) MG PO TABS: 325.0000 mg | ORAL_TABLET | Freq: Three times a day (TID) | ORAL | 1 refills | Status: DC

## 2023-08-10 MED ORDER — ROSUVASTATIN CALCIUM 5 MG PO TABS: 5.0000 mg | ORAL_TABLET | Freq: Every day | ORAL | 1 refills | Status: DC

## 2023-08-10 MED ORDER — AMLODIPINE BESYLATE 2.5 MG PO TABS: 2.5000 mg | ORAL_TABLET | Freq: Every day | ORAL | 1 refills | Status: DC

## 2023-08-10 MED ORDER — BREZTRI AEROSPHERE 160-9-4.8 MCG/ACT IN AERO: 2.0000 | INHALATION_SPRAY | Freq: Two times a day (BID) | RESPIRATORY_TRACT | 11 refills | Status: DC

## 2023-08-10 MED ORDER — HYDRALAZINE HCL 50 MG PO TABS: 50.0000 mg | ORAL_TABLET | Freq: Two times a day (BID) | ORAL | 1 refills | Status: DC

## 2023-08-10 NOTE — Patient Instructions (Signed)
[facility] Address: [address] [place], [place], [place] [address] Phone: [phone]  Please call to schedule your mammogram and/or bone density: [facility]  Address: [address], [place], [place] [address] Phone: [phone]  [facility] [address] [place],  [place]  [address] Phone: [phone]

## 2023-08-10 NOTE — Assessment & Plan Note (Signed)
Stable. Following with cardiology. Continue to monitor. Call with any concerns.

## 2023-08-10 NOTE — Assessment & Plan Note (Signed)
Under good control on current regimen. Continue current regimen. Continue to monitor. Call with any concerns. Refills given. Labs drawn today.   

## 2023-08-10 NOTE — Progress Notes (Signed)
BP 133/81   Pulse 89   Ht 5\' 2"  (1.575 m)   Wt 175 lb 12.8 oz (79.7 kg)   SpO2 97%   BMI 32.15 kg/m    Subjective:    Patient ID: Kristina Henson, female    DOB: [birth year], 67 y.o.   MRN: [id number]  HPI: Kristina Henson is a 66 y.o. female presenting on 08/10/2023 for comprehensive medical examination. Current medical complaints include:  HYPERTENSION / HYPERLIPIDEMIA Satisfied with current treatment? yes Duration of hypertension: chronic BP monitoring frequency: not checking BP medication side effects: no Past BP meds: hydralazine, amlodipine, lisniopril, hydrochlorothiazide, metoprolol Duration of hyperlipidemia: chronic Cholesterol medication side effects: no Cholesterol supplements: none Past cholesterol medications: crestor Medication compliance: excellent compliance Aspirin: no Recent stressors: no Recurrent headaches: no Visual changes: no Palpitations: no Dyspnea: no Chest pain: no Lower extremity edema: no Dizzy/lightheaded: no  ATRIAL FIBRILLATION Atrial fibrillation status: stable Satisfied with current treatment: yes  Medication side effects:  no Medication compliance: excellent compliance Palpitations:  no Chest pain:  no Dyspnea on exertion:  no Orthopnea:  no Syncope:  no Edema:  no Anti-coagulation: warfarin  Menopausal Symptoms: no  Depression Screen done today and results listed below:     08/10/2023   10:45 AM 02/07/2023   11:10 AM 12/23/2022    8:58 AM 11/16/2022    9:18 AM 08/09/2022   11:04 AM  Depression screen PHQ 2/9  Decreased Interest 0 0 0 0 0  Down, Depressed, Hopeless 0 0 0 0 0  PHQ - 2 Score 0 0 0 0 0  Altered sleeping  0 0 0 0  Tired, decreased energy  0 0 0 0  Change in appetite  0 0 0 0  Feeling bad or failure about yourself   0 0 0 0  Trouble concentrating  0 0 0 0  Moving slowly or fidgety/restless  0 0 0 0  Suicidal thoughts  0 0 0 0  PHQ-9 Score  0 0 0 0  Difficult doing work/chores   Not difficult at all  Not difficult at all Not difficult at all   Past Medical History:  Past Medical History:  Diagnosis Date   Aortic atherosclerosis (HCC)    Arthritis    BACK-LUMBAR   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   Atrial fibrillation (HCC)    a.) CHA2DS2-VASc = 5 (sex, HTN, CVA x 2, aortic plaque). b.) s/p ablation in 2008. c.) rate/rhythm maintained on oral amiodarone; not currently on daily anticoagulation.   Coronary artery disease    Duodenitis    Gastritis    HLD (hyperlipidemia)    Hypertension    IDA (iron deficiency anemia)    LBBB (left bundle branch block)    Mass of left submandibular region 04/29/2016   a.) FNA 04/29/2016 --> Bx (+) for pleomorphic adenoma; s/p resection   PAH (pulmonary artery hypertension) (HCC) 07/24/2020   a.) CT 07/24/2020 showed enlarged pulmonary artery consistent with PAH.   Pneumonia 2016   Sleep apnea    Stroke [facility]) 2012    Surgical History:  Past Surgical History:  Procedure Laterality Date   BIOPSY SALIVARY GLAND Left 04/29/2016   Procedure: SUBMANDIBULAR GLAND BIOPSY (FNA); Location: [facility]; Surgeon: Richarda Overlie, MD (IR)   cardiac catherization     CARDIAC ELECTROPHYSIOLOGY STUDY AND ABLATION N/A 2008   Procedure: ATRIAL FIBRILLATION ABLATION; Location: [facility]   COLONOSCOPY WITH  PROPOFOL N/A 11/18/2015   Procedure: COLONOSCOPY WITH PROPOFOL;  Surgeon: Midge Minium, MD;  Location: [facility] ENDOSCOPY;  Service: Endoscopy;  Laterality: N/A;   COLONOSCOPY WITH PROPOFOL N/A 12/02/2020   Procedure: COLONOSCOPY WITH PROPOFOL;  Surgeon: Midge Minium, MD;  Location: [facility] ENDOSCOPY;  Service: Endoscopy;  Laterality: N/A;   CORONARY ANGIOPLASTY     CYSTOSCOPY WITH BIOPSY N/A 11/30/2021   Procedure: CYSTOSCOPY WITH BLADDER BIOPSY;  Surgeon: Vanna Scotland, MD;  Location: [facility] ORS;  Service: Urology;  Laterality: N/A;   ESOPHAGOGASTRODUODENOSCOPY (EGD) WITH PROPOFOL N/A 04/23/2016    Procedure: ESOPHAGOGASTRODUODENOSCOPY (EGD) WITH PROPOFOL;  Surgeon: Midge Minium, MD;  Location: [facility];  Service: Endoscopy;  Laterality: N/A;  small bowel bx gastric antrum bx   SUBMANDIBULAR GLAND EXCISION Left 09/08/2017   Procedure: EXCISION SUBMANDIBULAR GLAND;  Surgeon: Bud Face, MD;  Location: [facility] ORS;  Service: ENT;  Laterality: Left;   TOTAL ABDOMINAL HYSTERECTOMY W/ BILATERAL SALPINGOOPHORECTOMY     Total    Medications:  Current Outpatient Medications on File Prior to Visit  Medication Sig   albuterol (VENTOLIN HFA) 108 (90 Base) MCG/ACT inhaler Inhale 2 puffs into the lungs every 6 (six) hours as needed for wheezing or shortness of breath.   amiodarone (PACERONE) 200 MG tablet Take 1 tablet (200 mg total) by mouth every morning.   conjugated estrogens (PREMARIN) vaginal cream Apply one pea-sized amount around the opening of the urethra daily for 2 weeks, then 3 times weekly moving forward.   warfarin (COUMADIN) 2 MG tablet Take by mouth.   traZODone (DESYREL) 50 MG tablet Take 1-2 tablets (50-100 mg total) by mouth at bedtime as needed for sleep. (Patient not taking: Reported on 08/10/2023)   No current facility-administered medications on file prior to visit.    Allergies:  Allergies  Allergen Reactions   Amoxicillin Other (See Comments)    Joint pains    Social History:  Social History   Socioeconomic History   Marital status: Divorced    Spouse name: Not on file   Number of children: Not on file   Years of education: 12   Highest education level: 12th grade  Occupational History   Occupation: disability   Tobacco Use   Smoking status: Former    Current packs/day: 0.00    Types: Cigarettes    Quit date: 1978    Years since quitting: 46.8   Smokeless tobacco: Never   Tobacco comments:    didnt smoke much   Vaping Use   Vaping status: Never Used  Substance and Sexual Activity   Alcohol use: Yes    Alcohol/week: 1.0 standard drink  of alcohol    Types: 1 Glasses of wine per week    Comment: occasionally   Drug use: No   Sexual activity: Not Currently  Other Topics Concern   Not on file  Social History Narrative   Not on file   Social Determinants of Health   Financial Resource Strain: Low Risk  (11/16/2022)   Overall Financial Resource Strain (CARDIA)    Difficulty of Paying Living Expenses: Not hard at all  Food Insecurity: No Food Insecurity (11/16/2022)   Hunger Vital Sign    Worried About Running Out of Food in the Last Year: Never true    Ran Out of Food in the Last Year: Never true  Transportation Needs: No Transportation Needs (11/16/2022)   PRAPARE - Administrator, Civil Service (Medical): No    Lack of Transportation (Non-Medical): No  Physical Activity: Insufficiently Active (11/16/2022)   Exercise Vital Sign    Days of Exercise per Week: 3 days    Minutes of Exercise per Session: 30 min  Stress: No Stress Concern Present (11/16/2022)   [employer] of Occupational Health - Occupational Stress Questionnaire    Feeling of Stress : Not at all  Social Connections: Moderately Isolated (11/16/2022)   Social Connection and Isolation Panel [NHANES]    Frequency of Communication with Friends and Family: More than three times a week    Frequency of Social Gatherings with Friends and Family: Once a week    Attends Religious Services: 1 to 4 times per year    Active Member of [employer] or Organizations: No    Attends Banker Meetings: Never    Marital Status: Divorced  Catering manager Violence: Not At Risk (11/16/2022)   Humiliation, Afraid, Rape, and Kick questionnaire    Fear of Current or Ex-Partner: No    Emotionally Abused: No    Physically Abused: No    Sexually Abused: No   Social History   Tobacco Use  Smoking Status Former   Current packs/day: 0.00   Types: Cigarettes   Quit date: 1978   Years since quitting: 46.8  Smokeless Tobacco Never  Tobacco Comments    didnt smoke much    Social History   Substance and Sexual Activity  Alcohol Use Yes   Alcohol/week: 1.0 standard drink of alcohol   Types: 1 Glasses of wine per week   Comment: occasionally    Family History:  Family History  Problem Relation Age of Onset   Arthritis Mother    Cancer Mother    Hypertension Sister    Heart attack Sister    Cancer Sister        breast cancer   Asthma Brother    Dementia Brother    Breast cancer Neg Hx    Kidney cancer Neg Hx    Bladder Cancer Neg Hx     Past medical history, surgical history, medications, allergies, family history and social history reviewed with patient today and changes made to appropriate areas of the chart.   Review of Systems  Constitutional: Negative.   HENT: Negative.    Eyes: Negative.   Respiratory: Negative.    Cardiovascular: Negative.   Gastrointestinal: Negative.   Genitourinary: Negative.   Musculoskeletal: Negative.   Skin: Negative.   Neurological: Negative.   Endo/Heme/Allergies: Negative.   Psychiatric/Behavioral: Negative.     All other ROS negative except what is listed above and in the HPI.      Objective:    BP 133/81   Pulse 89   Ht 5\' 2"  (1.575 m)   Wt 175 lb 12.8 oz (79.7 kg)   SpO2 97%   BMI 32.15 kg/m   Wt Readings from Last 3 Encounters:  08/10/23 175 lb 12.8 oz (79.7 kg)  06/27/23 170 lb 12.8 oz (77.5 kg)  06/01/23 173 lb (78.5 kg)    Physical Exam Vitals and nursing note reviewed.  Constitutional:      General: She is not in acute distress.    Appearance: Normal appearance. She is not ill-appearing, toxic-appearing or diaphoretic.  HENT:     Head: Normocephalic and atraumatic.     Right Ear: Tympanic membrane, ear canal and external ear normal. There is no impacted cerumen.     Left Ear: Tympanic membrane, ear canal and external ear normal. There is no impacted cerumen.  Nose: Nose normal. No congestion or rhinorrhea.     Mouth/Throat:     Mouth: Mucous membranes  are moist.     Pharynx: Oropharynx is clear. No oropharyngeal exudate or posterior oropharyngeal erythema.  Eyes:     General: No scleral icterus.       Right eye: No discharge.        Left eye: No discharge.     Extraocular Movements: Extraocular movements intact.     Conjunctiva/sclera: Conjunctivae normal.     Pupils: Pupils are equal, round, and reactive to light.  Neck:     Vascular: No carotid bruit.  Cardiovascular:     Rate and Rhythm: Normal rate and regular rhythm.     Pulses: Normal pulses.     Heart sounds: No murmur heard.    No friction rub. No gallop.  Pulmonary:     Effort: Pulmonary effort is normal. No respiratory distress.     Breath sounds: Normal breath sounds. No stridor. No wheezing, rhonchi or rales.  Chest:     Chest wall: No tenderness.  Abdominal:     General: Abdomen is flat. Bowel sounds are normal. There is no distension.     Palpations: Abdomen is soft. There is no mass.     Tenderness: There is no abdominal tenderness. There is no right CVA tenderness, left CVA tenderness, guarding or rebound.     Hernia: No hernia is present.  Genitourinary:    Comments: Breast and pelvic exams deferred with shared decision making Musculoskeletal:        General: No swelling, tenderness, deformity or signs of injury.     Cervical back: Normal range of motion and neck supple. No rigidity. No muscular tenderness.     Right lower leg: No edema.     Left lower leg: No edema.  Lymphadenopathy:     Cervical: No cervical adenopathy.  Skin:    General: Skin is warm and dry.     Capillary Refill: Capillary refill takes less than 2 seconds.     Coloration: Skin is not jaundiced or pale.     Findings: No bruising, erythema, lesion or rash.  Neurological:     General: No focal deficit present.     Mental Status: She is alert and oriented to person, place, and time. Mental status is at baseline.     Cranial Nerves: No cranial nerve deficit.     Sensory: No sensory  deficit.     Motor: No weakness.     Coordination: Coordination normal.     Gait: Gait normal.     Deep Tendon Reflexes: Reflexes normal.  Psychiatric:        Mood and Affect: Mood normal.        Behavior: Behavior normal.        Thought Content: Thought content normal.        Judgment: Judgment normal.     Results for orders placed or performed in visit on 08/10/23  Microalbumin, Urine Waived  Result Value Ref Range   Microalb, Ur Waived 80 (H) 0 - 19 mg/L   Creatinine, Urine Waived 50 10 - 300 mg/dL   Microalb/Creat Ratio 30-300 (H) <30 mg/g      Assessment & Plan:   Problem List Items Addressed This Visit       Cardiovascular and Mediastinum   Atrial fibrillation (HCC)    Stable. Following with cardiology. Continue to monitor. Call with any concerns.       Relevant  Medications   amLODipine (NORVASC) 2.5 MG tablet   hydrALAZINE (APRESOLINE) 50 MG tablet   lisinopril-hydrochlorothiazide (ZESTORETIC) 20-25 MG tablet   rosuvastatin (CRESTOR) 5 MG tablet   metoprolol tartrate (LOPRESSOR) 25 MG tablet     Genitourinary   Benign hypertensive renal disease    Under good control on current regimen. Continue current regimen. Continue to monitor. Call with any concerns. Refills given. Labs drawn today.        Relevant Orders   Microalbumin, Urine Waived (Completed)   TSH     Other   HLD (hyperlipidemia)    Under good control on current regimen. Continue current regimen. Continue to monitor. Call with any concerns. Refills given. Labs drawn today.       Relevant Medications   amLODipine (NORVASC) 2.5 MG tablet   hydrALAZINE (APRESOLINE) 50 MG tablet   lisinopril-hydrochlorothiazide (ZESTORETIC) 20-25 MG tablet   rosuvastatin (CRESTOR) 5 MG tablet   metoprolol tartrate (LOPRESSOR) 25 MG tablet   Other Relevant Orders   Comprehensive metabolic panel   Lipid Panel w/o Chol/HDL Ratio   Anemia due to chronic illness    Rechecking labs today. Continue to monitor. Call  with any concerns.       Relevant Medications   FEROSUL 325 (65 Fe) MG tablet   Other Relevant Orders   CBC with Differential/Platelet   Other Visit Diagnoses     Routine general medical examination at a health care facility    -  Primary   Vaccines up to date. Screening labs checked today. Pap N/A. Mammo ordered. Colonoscopy and DEXA up to date. Continue diet and exercise. Call with any concerns.   Seborrheic keratosis       Given location on face will refer to dermatology. Reassued patient.   Relevant Orders   Ambulatory referral to Dermatology   Sebaceous cyst       Given location near ulnar nerve will refer to general surgeon if she wants.   Need for COVID-19 vaccine       COVID shot given today.   Relevant Orders   Pfizer Comirnaty Covid -19 Vaccine 48yrs and older (Completed)   Encounter for screening mammogram for malignant neoplasm of breast       Mammogram ordered today.   Relevant Orders   MM 3D SCREENING MAMMOGRAM BILATERAL BREAST        Follow up plan: Return in about 6 months (around 02/07/2024).   LABORATORY TESTING:  - Pap smear: not applicable  IMMUNIZATIONS:   - Tdap: Tetanus vaccination status reviewed: last tetanus booster within 10 years. - Influenza: Up to date - Prevnar: Up to date - COVID: Administered today - HPV: Not applicable - Shingrix vaccine: Given elsewhere  SCREENING: -Mammogram: Ordered today  - Colonoscopy: Up to date  - Bone Density: Up to date   PATIENT COUNSELING:   Advised to take 1 mg of folate supplement per day if capable of pregnancy.   Sexuality: Discussed sexually transmitted diseases, partner selection, use of condoms, avoidance of unintended pregnancy  and contraceptive alternatives.   Advised to avoid cigarette smoking.  I discussed with the patient that most people either abstain from alcohol or drink within safe limits (<=14/week and <=4 drinks/occasion for males, <=7/weeks and <= 3 drinks/occasion for females)  and that the risk for alcohol disorders and other health effects rises proportionally with the number of drinks per week and how often a drinker exceeds daily limits.  Discussed cessation/primary prevention of drug use and availability of  treatment for abuse.   Diet: Encouraged to adjust caloric intake to maintain  or achieve ideal body weight, to reduce intake of dietary saturated fat and total fat, to limit sodium intake by avoiding high sodium foods and not adding table salt, and to maintain adequate dietary potassium and calcium preferably from fresh fruits, vegetables, and low-fat dairy products.    stressed the importance of regular exercise  Injury prevention: Discussed safety belts, safety helmets, smoke detector, smoking near bedding or upholstery.   Dental health: Discussed importance of regular tooth brushing, flossing, and dental visits.    NEXT PREVENTATIVE PHYSICAL DUE IN 1 YEAR. Return in about 6 months (around 02/07/2024).

## 2023-08-10 NOTE — Assessment & Plan Note (Signed)
Rechecking labs today. Continue to monitor. Call with any concerns.  °

## 2023-08-11 LAB — CBC WITH DIFFERENTIAL/PLATELET
Basophils Absolute: 0 10*3/uL (ref 0.0–0.2)
Basos: 0 %
EOS (ABSOLUTE): 0.1 10*3/uL (ref 0.0–0.4)
Eos: 2 %
Hematocrit: 37.5 % (ref 34.0–46.6)
Hemoglobin: 11.7 g/dL (ref 11.1–15.9)
Immature Grans (Abs): 0 10*3/uL (ref 0.0–0.1)
Immature Granulocytes: 1 %
Lymphocytes Absolute: 0.6 10*3/uL — ABNORMAL LOW (ref 0.7–3.1)
Lymphs: 22 %
MCH: 27.3 pg (ref 26.6–33.0)
MCHC: 31.2 g/dL — ABNORMAL LOW (ref 31.5–35.7)
MCV: 88 fL (ref 79–97)
Monocytes Absolute: 0.2 10*3/uL (ref 0.1–0.9)
Monocytes: 7 %
Neutrophils Absolute: 1.9 10*3/uL (ref 1.4–7.0)
Neutrophils: 68 %
RBC: 4.28 x10E6/uL (ref 3.77–5.28)
RDW: 15.6 % — ABNORMAL HIGH (ref 11.7–15.4)
WBC: 2.8 10*3/uL — ABNORMAL LOW (ref 3.4–10.8)

## 2023-08-11 LAB — COMPREHENSIVE METABOLIC PANEL
ALT: 48 [IU]/L — ABNORMAL HIGH (ref 0–32)
AST: 47 [IU]/L — ABNORMAL HIGH (ref 0–40)
Albumin: 4.1 g/dL (ref 3.9–4.9)
Alkaline Phosphatase: 85 [IU]/L (ref 44–121)
BUN/Creatinine Ratio: 18 (ref 12–28)
BUN: 17 mg/dL (ref 8–27)
Bilirubin Total: 0.6 mg/dL (ref 0.0–1.2)
CO2: 22 mmol/L (ref 20–29)
Calcium: 9.3 mg/dL (ref 8.7–10.3)
Chloride: 101 mmol/L (ref 96–106)
Creatinine, Ser: 0.94 mg/dL (ref 0.57–1.00)
Globulin, Total: 3 g/dL (ref 1.5–4.5)
Glucose: 89 mg/dL (ref 70–99)
Potassium: 3.8 mmol/L (ref 3.5–5.2)
Sodium: 141 mmol/L (ref 134–144)
Total Protein: 7.1 g/dL (ref 6.0–8.5)
eGFR: 67 mL/min/{1.73_m2} (ref >59)

## 2023-08-11 LAB — LIPID PANEL W/O CHOL/HDL RATIO
Cholesterol, Total: 132 mg/dL (ref 100–199)
HDL: 44 mg/dL (ref >39)
LDL Chol Calc (NIH): 66 mg/dL (ref 0–99)
Triglycerides: 124 mg/dL (ref 0–149)
VLDL Cholesterol Cal: 22 mg/dL (ref 5–40)

## 2023-08-11 LAB — TSH: TSH: 1.52 u[IU]/mL (ref 0.450–4.500)

## 2023-08-15 ENCOUNTER — Other Ambulatory Visit: Payer: Self-pay | Admitting: Family Medicine

## 2023-08-15 DIAGNOSIS — D72819 Decreased white blood cell count, unspecified: Secondary | ICD-10-CM

## 2023-08-19 DIAGNOSIS — I480 Paroxysmal atrial fibrillation: Secondary | ICD-10-CM | POA: Diagnosis not present

## 2023-08-31 DIAGNOSIS — I480 Paroxysmal atrial fibrillation: Secondary | ICD-10-CM | POA: Diagnosis not present

## 2023-09-02 DIAGNOSIS — I480 Paroxysmal atrial fibrillation: Secondary | ICD-10-CM | POA: Diagnosis not present

## 2023-09-12 ENCOUNTER — Other Ambulatory Visit: Payer: Medicare HMO

## 2023-09-13 DIAGNOSIS — I480 Paroxysmal atrial fibrillation: Secondary | ICD-10-CM | POA: Diagnosis not present

## 2023-09-15 ENCOUNTER — Other Ambulatory Visit: Payer: Medicare HMO

## 2023-09-15 DIAGNOSIS — D72819 Decreased white blood cell count, unspecified: Secondary | ICD-10-CM | POA: Diagnosis not present

## 2023-09-16 ENCOUNTER — Other Ambulatory Visit: Payer: Self-pay | Admitting: Family Medicine

## 2023-09-16 DIAGNOSIS — D61818 Other pancytopenia: Secondary | ICD-10-CM

## 2023-09-16 LAB — CBC WITH DIFFERENTIAL/PLATELET
Basophils Absolute: 0 10*3/uL (ref 0.0–0.2)
Basos: 1 %
EOS (ABSOLUTE): 0 10*3/uL (ref 0.0–0.4)
Eos: 1 %
Hematocrit: 30.7 % — ABNORMAL LOW (ref 34.0–46.6)
Hemoglobin: 9.8 g/dL — ABNORMAL LOW (ref 11.1–15.9)
Immature Grans (Abs): 0 10*3/uL (ref 0.0–0.1)
Immature Granulocytes: 1 %
Lymphocytes Absolute: 0.7 10*3/uL (ref 0.7–3.1)
Lymphs: 22 %
MCH: 27.2 pg (ref 26.6–33.0)
MCHC: 31.9 g/dL (ref 31.5–35.7)
MCV: 85 fL (ref 79–97)
Monocytes Absolute: 0.2 10*3/uL (ref 0.1–0.9)
Monocytes: 8 %
Neutrophils Absolute: 2.1 10*3/uL (ref 1.4–7.0)
Neutrophils: 67 %
Platelets: 96 10*3/uL — CL (ref 150–450)
RBC: 3.6 x10E6/uL — ABNORMAL LOW (ref 3.77–5.28)
RDW: 15.3 % (ref 11.7–15.4)
WBC: 3.1 10*3/uL — ABNORMAL LOW (ref 3.4–10.8)

## 2023-09-26 DIAGNOSIS — I480 Paroxysmal atrial fibrillation: Secondary | ICD-10-CM | POA: Diagnosis not present

## 2023-10-10 ENCOUNTER — Encounter: Payer: Self-pay | Admitting: Internal Medicine

## 2023-10-10 DIAGNOSIS — I480 Paroxysmal atrial fibrillation: Secondary | ICD-10-CM | POA: Diagnosis not present

## 2023-10-10 DIAGNOSIS — Z7901 Long term (current) use of anticoagulants: Secondary | ICD-10-CM | POA: Diagnosis not present

## 2023-10-17 ENCOUNTER — Encounter: Payer: Self-pay | Admitting: Family Medicine

## 2023-10-17 ENCOUNTER — Ambulatory Visit (INDEPENDENT_AMBULATORY_CARE_PROVIDER_SITE_OTHER): Payer: Medicare HMO | Admitting: Family Medicine

## 2023-10-17 ENCOUNTER — Inpatient Hospital Stay: Payer: Medicare HMO

## 2023-10-17 ENCOUNTER — Encounter: Payer: Self-pay | Admitting: Internal Medicine

## 2023-10-17 ENCOUNTER — Inpatient Hospital Stay: Payer: Medicare HMO | Admitting: Internal Medicine

## 2023-10-17 VITALS — BP 131/81 | HR 46 | Temp 97.4°F | Wt 166.4 lb

## 2023-10-17 DIAGNOSIS — R0602 Shortness of breath: Secondary | ICD-10-CM

## 2023-10-17 DIAGNOSIS — R001 Bradycardia, unspecified: Secondary | ICD-10-CM

## 2023-10-17 DIAGNOSIS — D638 Anemia in other chronic diseases classified elsewhere: Secondary | ICD-10-CM

## 2023-10-17 MED ORDER — METOPROLOL TARTRATE 25 MG PO TABS: 12.5000 mg | ORAL_TABLET | Freq: Two times a day (BID) | ORAL | Status: DC

## 2023-10-17 NOTE — Assessment & Plan Note (Deleted)
#   Idiopathic cytopenia of undetermined significance [bone marrow biopsy-2021 mild dyspoiesis; SNP-no multiple abnormalities.]  #  Anemia- likely IDA vs anemia of chronic disease  [unclear etiology]- s/p IV iron; improved. Hb ~12.5; [ferritin140s; iron sat-14%- SEP 2024]; continue PO iron once a day.  Hold any IV iron. Stable. Order Intelli-Myeloid panel.   #Mild asymptomatic leukopenia/intermittent neutropenia/ Mild intermittent thrombocytopenia-today 120 - stable.   # DISPOSITION:  # no infusions today # follow up in 6 months-MD/cbc/bmp/iron studies/ferritin; possible Venofer-- Dr.B

## 2023-10-17 NOTE — Patient Instructions (Signed)
Cut your metoprolol in 1/2 so take 1/2 pill 2x a day until you see the heart doctor.

## 2023-10-17 NOTE — Progress Notes (Deleted)
[facility] CONSULT NOTE  Patient Care Team: Dorcas Carrow, DO as PCP - General (Family Medicine) Lady Gary Darlin Priestly, MD as Consulting Physician (Cardiology) Bud Face, MD as Referring Physician (Otolaryngology) Minor, Theadora Rama, RN (Inactive) as [facility] Care Management Donneta Romberg, Worthy Flank, MD as Consulting Physician (Hematology and Oncology)  CHIEF COMPLAINTS/PURPOSE OF CONSULTATION:   # Anemia of chronic disease/mild intermittent leucopenia/thromboctyopenia? Autoimmune- EGD/ [place] [Dr.Wohl; 2017] chronic back pain [? 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   HISTORY OF PRESENTING ILLNESS: Patient is alone.  Ambulating independently.  Kristina Henson 68 y.o.  female mild to moderate anemia/intermittent thrombocytopenia/leukopenia of Unclear etiology- is here for follow-up.  No blood in stools or black or stools.  She continues to be on p.o. iron once a day.  Denies any worsening fatigue or lightheadedness or falls.  Review of Systems  Constitutional:  Positive for malaise/fatigue. Negative for chills, diaphoresis, fever and weight loss.  HENT:  Negative for nosebleeds and sore throat.   Eyes:  Negative for double vision.  Respiratory:  Negative for cough, hemoptysis, sputum production, shortness of breath and wheezing.   Cardiovascular:  Negative for chest pain, palpitations, orthopnea and leg swelling.  Gastrointestinal:  Negative for abdominal pain, blood in stool, constipation, diarrhea, heartburn, melena, nausea and vomiting.  Genitourinary:  Negative for dysuria, frequency and urgency.  Musculoskeletal:  Positive for joint pain. Negative for back pain.  Skin: Negative.  Negative for itching and rash.   Neurological:  Negative for dizziness, tingling, focal weakness, weakness and headaches.  Endo/Heme/Allergies:  Does not bruise/bleed easily.  Psychiatric/Behavioral:  Negative for depression. The patient is not nervous/anxious and does not have insomnia.      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   Atrial fibrillation (HCC)    a.) CHA2DS2-VASc = 5 (sex, HTN, CVA x 2, aortic plaque). b.) s/p ablation in 2008. c.) rate/rhythm maintained on oral amiodarone; not currently on daily anticoagulation.   Coronary artery disease    Duodenitis    Gastritis    HLD (hyperlipidemia)    Hypertension    IDA (iron deficiency anemia)    LBBB (left bundle branch block)    Mass of left submandibular region 04/29/2016   a.) FNA 04/29/2016 --> Bx (+) for pleomorphic adenoma; s/p resection   PAH (pulmonary artery hypertension) (HCC) 07/24/2020   a.) CT 07/24/2020 showed enlarged pulmonary artery consistent with PAH.   Pneumonia 2016   Sleep apnea    Stroke [facility] 2012    SURGICAL HISTORY: Past Surgical History:  Procedure Laterality Date   BIOPSY SALIVARY GLAND Left 04/29/2016   Procedure: SUBMANDIBULAR GLAND BIOPSY (FNA); Location: [facility]; Surgeon: Richarda Overlie, MD (IR)   cardiac catherization     CARDIAC ELECTROPHYSIOLOGY STUDY AND ABLATION N/A 2008   Procedure: ATRIAL FIBRILLATION ABLATION; Location: [facility]   COLONOSCOPY WITH PROPOFOL N/A 11/18/2015   Procedure: COLONOSCOPY WITH PROPOFOL;  Surgeon: Midge Minium, MD;  Location: [facility] ENDOSCOPY;  Service: Endoscopy;  Laterality: N/A;   COLONOSCOPY WITH PROPOFOL N/A 12/02/2020   Procedure: COLONOSCOPY WITH PROPOFOL;  Surgeon: Midge Minium, MD;  Location[facility] END[facility]COPY;  Service: Endoscopy;  Laterality: N/A;   CORONARY ANGIOPLASTY     CYSTOSCOPY WITH  BIOPSY N/A 11/30/2021   Procedure: CYSTOSCOPY WITH BLADDER BIOPSY;  Surgeon: Vanna Scotland, MD;  Location: [facility] ORS;  Service: Urology;  Laterality: N/A;   ESOPHAGOGASTRODUODENOSCOPY (EGD) WITH PROPOFOL N/A 04/23/2016   Procedure: ESOPHAGOGASTRODUODENOSCOPY (EGD) WITH PROPOFOL;  Surgeon: Midge Minium, MD;  Location: [facility];  Service: Endoscopy;  Laterality: N/A;  small bowel bx gastric antrum bx   SUBMANDIBULAR GLAND EXCISION Left 09/08/2017   Procedure: EXCISION SUBMANDIBULAR GLAND;  Surgeon: Bud Face, MD;  Location: [facility] ORS;  Service: ENT;  Laterality: Left;   TOTAL ABDOMINAL HYSTERECTOMY W/ BILATERAL SALPINGOOPHORECTOMY     Total    SOCIAL HISTORY: Social History   Socioeconomic History   Marital status: Divorced    Spouse name: Not on file   Number of children: Not on file   Years of education: 12   Highest education level: 12th grade  Occupational History   Occupation: disability   Tobacco Use   Smoking status: Former    Current packs/day: 0.00    Types: Cigarettes    Quit date: 1978    Years since quitting: 47.0   Smokeless tobacco: Never   Tobacco comments:    didnt smoke much   Vaping Use   Vaping status: Never Used  Substance and Sexual Activity   Alcohol use: Yes    Alcohol/week: 1.0 standard drink of alcohol    Types: 1 Glasses of wine per week    Comment: occasionally   Drug use: No   Sexual activity: Not Currently  Other Topics Concern   Not on file  Social History Narrative   Not on file   Social Drivers of Health   Financial Resource Strain: Low Risk  (11/16/2022)   Overall Financial Resource Strain (CARDIA)    Difficulty of Paying Living Expenses: Not hard at all  Food Insecurity: No Food Insecurity (11/16/2022)   Hunger Vital Sign    Worried About Running Out of Food in the Last Year: Never true    Ran Out of Food in the Last Year: Never true  Transportation Needs: No Transportation Needs (11/16/2022)   PRAPARE -  Administrator, Civil Service (Medical): No    Lack of Transportation (Non-Medical): No  Physical Activity: Insufficiently Active (11/16/2022)   Exercise Vital Sign    Days of Exercise per Week: 3 days    Minutes of Exercise per Session: 30 min  Stress: No Stress Concern Present (11/16/2022)   [employer] of Occupational Health - Occupational Stress Questionnaire    Feeling of Stress : Not at all  Social Connections: Moderately Isolated (11/16/2022)   Social Connection and Isolation Panel [NHANES]    Frequency of Communication with Friends and Family: More than three times a week    Frequency of Social Gatherings with Friends and Family: Once a week    Attends Religious Services: 1 to 4 times per year    Active Member of [employer] or Organizations: No    Attends Banker Meetings: Never    Marital Status: Divorced  Catering manager Violence: Not At Risk (11/16/2022)   Humiliation, Afraid, Rape, and Kick questionnaire    Fear of Current or Ex-Partner: No    Emotionally Abused: No    Physically Abused: No    Sexually Abused: No    FAMILY HISTORY: Family History  Problem Relation Age of  Onset   Arthritis Mother    Cancer Mother    Hypertension Sister    Heart attack Sister    Cancer Sister        breast cancer   Asthma Brother    Dementia Brother    Breast cancer Neg Hx    Kidney cancer Neg Hx    Bladder Cancer Neg Hx     ALLERGIES:  is allergic to amoxicillin.  MEDICATIONS:  Current Outpatient Medications  Medication Sig Dispense Refill   albuterol (VENTOLIN HFA) 108 (90 Base) MCG/ACT inhaler Inhale 2 puffs into the lungs every 6 (six) hours as needed for wheezing or shortness of breath. 8 g 3   amiodarone (PACERONE) 200 MG tablet Take 1 tablet (200 mg total) by mouth every morning. 30 tablet 0   amLODipine (NORVASC) 2.5 MG tablet Take 1 tablet (2.5 mg total) by mouth daily. 90 tablet 1   Budeson-Glycopyrrol-Formoterol (BREZTRI AEROSPHERE)  160-9-4.8 MCG/ACT AERO Inhale 2 puffs into the lungs 2 (two) times daily. 10.7 g 11   conjugated estrogens (PREMARIN) vaginal cream Apply one pea-sized amount around the opening of the urethra daily for 2 weeks, then 3 times weekly moving forward. 30 g 4   FEROSUL 325 (65 Fe) MG tablet Take 1 tablet (325 mg total) by mouth 3 (three) times daily with meals. 270 tablet 1   hydrALAZINE (APRESOLINE) 50 MG tablet Take 1 tablet (50 mg total) by mouth 2 (two) times daily. 180 tablet 1   lisinopril-hydrochlorothiazide (ZESTORETIC) 20-25 MG tablet Take 1 tablet by mouth 2 (two) times daily. 180 tablet 1   metoprolol tartrate (LOPRESSOR) 25 MG tablet Take 1 tablet (25 mg total) by mouth 2 (two) times daily. 180 tablet 1   rosuvastatin (CRESTOR) 5 MG tablet Take 1 tablet (5 mg total) by mouth daily. 90 tablet 1   traZODone (DESYREL) 50 MG tablet Take 1-2 tablets (50-100 mg total) by mouth at bedtime as needed for sleep. (Patient not taking: Reported on 08/10/2023) 180 tablet 1   warfarin (COUMADIN) 2 MG tablet Take by mouth.     No current facility-administered medications for this visit.      Marland Kitchen  PHYSICAL EXAMINATION: ECOG PERFORMANCE STATUS: 1 - Symptomatic but completely ambulatory  There were no vitals filed for this visit.  There were no vitals filed for this visit.   Physical Exam HENT:     Head: Normocephalic and atraumatic.     Mouth/Throat:     Pharynx: No oropharyngeal exudate.  Eyes:     Pupils: Pupils are equal, round, and reactive to light.  Cardiovascular:     Rate and Rhythm: Normal rate and regular rhythm.  Pulmonary:     Effort: No respiratory distress.     Breath sounds: No wheezing.  Abdominal:     General: Bowel sounds are normal. There is no distension.     Palpations: Abdomen is soft. There is no mass.     Tenderness: There is no abdominal tenderness. There is no guarding or rebound.  Musculoskeletal:        General: No tenderness. Normal range of motion.      Cervical back: Normal range of motion and neck supple.  Skin:    General: Skin is warm.  Neurological:     Mental Status: She is alert and oriented to person, place, and time.  Psychiatric:        Mood and Affect: Affect normal.      LABORATORY DATA:  I have  reviewed the data as listed Lab Results  Component Value Date   WBC 3.1 (L) 09/15/2023   HGB 9.8 (L) 09/15/2023   HCT 30.7 (L) 09/15/2023   MCV 85 09/15/2023   PLT 96 (LL) 09/15/2023   Recent Labs    12/21/22 1233 04/06/23 1056 06/23/23 1044 08/10/23 1129  NA 137  --  136 141  K 3.4*  --  3.6 3.8  CL 102  --  102 101  CO2 26  --  26 22  GLUCOSE 113*  --  106* 89  BUN 22  --  23 17  CREATININE 0.92 1.00 0.85 0.94  CALCIUM 9.0  --  9.0 9.3  GFRNONAA >60  --  >60  --   PROT  --   --   --  7.1  ALBUMIN  --   --   --  4.1  AST  --   --   --  47*  ALT  --   --   --  48*  ALKPHOS  --   --   --  85  BILITOT  --   --   --  0.6    RADIOGRAPHIC STUDIES: I have personally reviewed the radiological images as listed and agreed with the findings in the report. No results found.   ASSESSMENT & PLAN:   Anemia due to chronic illness # Idiopathic cytopenia of undetermined significance [bone marrow biopsy-2021 mild dyspoiesis; SNP-no multiple abnormalities.]  #  Anemia- likely IDA vs anemia of chronic disease  [unclear etiology]- s/p IV iron; improved. Hb ~12.5; [ferritin140s; iron sat-14%- SEP 2024]; continue PO iron once a day.  Hold any IV iron. Stable. Order Intelli-Myeloid panel.   #Mild asymptomatic leukopenia/intermittent neutropenia/ Mild intermittent thrombocytopenia-today 120 - stable.   # DISPOSITION:  # no infusions today # follow up in 6 months-MD/cbc/bmp/iron studies/ferritin; possible Venofer-- Dr.B   All questions were answered. The patient knows to call the clinic with any problems, questions or concerns.     Earna Coder, MD 10/17/2023 1:20 PM

## 2023-10-17 NOTE — Progress Notes (Signed)
BP 131/81   Pulse (!) 46   Temp (!) 97.4 F (36.3 C) (Oral)   Wt 166 lb 6.4 oz (75.5 kg)   SpO2 96%   BMI 30.43 kg/m    Subjective:    Patient ID: Kristina Henson, female    DOB: 04/24/1956, 68 y.o.   MRN: [id number]  HPI: Kristina Henson is a 68 y.o. female  Chief Complaint  Patient presents with   Breathing Problem    Patient says she has been feeling "winded and out of breath" since Saturday. Patient says she has been using inhaler, and says it makes her feel a little better. Patient denies having any chest pains, just feelings of being "winded."   Was supposed to see her hematologist today but the transportation bus brought her here instead. She has a follow up to see him in 11 days.   SHORTNESS OF BREATH Duration: 2 days Onset: sudden Description of breathing discomfort: feeling winded and short of breath Severity: mild Episode duration: 2 days Frequency: coming and going Related to exertion: yes Cough: no Chest tightness: no Wheezing: no Fevers: no Chest pain: no Palpitations: no  Nausea: no Diaphoresis: no Deconditioning: yes Status: better Treatments attempted: inhaler, rest  Relevant past medical, surgical, family and social history reviewed and updated as indicated. Interim medical history since our last visit reviewed. Allergies and medications reviewed and updated.  Review of Systems  Constitutional: Negative.   Respiratory:  Positive for shortness of breath. Negative for apnea, cough, choking, chest tightness, wheezing and stridor.   Cardiovascular: Negative.   Musculoskeletal: Negative.   Psychiatric/Behavioral: Negative.      Per HPI unless specifically indicated above     Objective:    BP 131/81   Pulse (!) 46   Temp (!) 97.4 F (36.3 C) (Oral)   Wt 166 lb 6.4 oz (75.5 kg)   SpO2 96%   BMI 30.43 kg/m   Wt Readings from Last 3 Encounters:  10/17/23 166 lb 6.4 oz (75.5 kg)  08/10/23 175 lb 12.8 oz (79.7 kg)  06/27/23 170 lb 12.8 oz  (77.5 kg)    Physical Exam Vitals and nursing note reviewed.  Constitutional:      General: She is not in acute distress.    Appearance: Normal appearance. She is not ill-appearing, toxic-appearing or diaphoretic.  HENT:     Head: Normocephalic and atraumatic.     Right Ear: External ear normal.     Left Ear: External ear normal.     Nose: Nose normal.     Mouth/Throat:     Mouth: Mucous membranes are moist.     Pharynx: Oropharynx is clear.  Eyes:     General: No scleral icterus.       Right eye: No discharge.        Left eye: No discharge.     Extraocular Movements: Extraocular movements intact.     Conjunctiva/sclera: Conjunctivae normal.     Pupils: Pupils are equal, round, and reactive to light.  Cardiovascular:     Rate and Rhythm: Normal rate and regular rhythm.     Pulses: Normal pulses.     Heart sounds: Normal heart sounds. No murmur heard.    No friction rub. No gallop.  Pulmonary:     Effort: Pulmonary effort is normal. No respiratory distress.     Breath sounds: Normal breath sounds. No stridor. No wheezing, rhonchi or rales.  Chest:     Chest wall: No tenderness.  Musculoskeletal:        General: Normal range of motion.     Cervical back: Normal range of motion and neck supple.  Skin:    General: Skin is warm and dry.     Capillary Refill: Capillary refill takes less than 2 seconds.     Coloration: Skin is not jaundiced or pale.     Findings: No bruising, erythema, lesion or rash.  Neurological:     General: No focal deficit present.     Mental Status: She is alert and oriented to person, place, and time. Mental status is at baseline.  Psychiatric:        Mood and Affect: Mood normal.        Behavior: Behavior normal.        Thought Content: Thought content normal.        Judgment: Judgment normal.     Results for orders placed or performed in visit on 09/15/23  CBC with Differential/Platelet   Collection Time: 09/15/23 10:38 AM  Result Value Ref  Range   WBC 3.1 (L) 3.4 - 10.8 x10E3/uL   RBC 3.60 (L) 3.77 - 5.28 x10E6/uL   Hemoglobin 9.8 (L) 11.1 - 15.9 g/dL   Hematocrit 69.6 (L) 29.5 - 46.6 %   MCV 85 79 - 97 fL   MCH 27.2 26.6 - 33.0 pg   MCHC 31.9 31.5 - 35.7 g/dL   RDW 28.4 13.2 - 44.0 %   Platelets 96 (LL) 150 - 450 x10E3/uL   Neutrophils 67 Not Estab. %   Lymphs 22 Not Estab. %   Monocytes 8 Not Estab. %   Eos 1 Not Estab. %   Basos 1 Not Estab. %   Neutrophils Absolute 2.1 1.4 - 7.0 x10E3/uL   Lymphocytes Absolute 0.7 0.7 - 3.1 x10E3/uL   Monocytes Absolute 0.2 0.1 - 0.9 x10E3/uL   EOS (ABSOLUTE) 0.0 0.0 - 0.4 x10E3/uL   Basophils Absolute 0.0 0.0 - 0.2 x10E3/uL   Immature Granulocytes 1 Not Estab. %   Immature Grans (Abs) 0.0 0.0 - 0.1 x10E3/uL   Hematology Comments: Note:       Assessment & Plan:   Problem List Items Addressed This Visit   None Visit Diagnoses       Anemia of chronic disease    -  Primary   Due to see hematology in 11 days. Labs drawn for them today- will send them over when results are back. Call with any concerns.   Relevant Orders   Ferritin   Iron Binding Cap (TIBC)([facility])   Basic metabolic panel   CBC with Differential/Platelet   IntelliGEN Myeloid     SOB (shortness of breath)       Improved. Concern that she was in A fib over the weekend and has come out of it- EKG shows marked bradycardia. Due to see cardiology on Thursday.   Relevant Orders   EKG 12-Lead     Bradycardia       Will cut her metoprolol down to 12.5mg  BID and follow up with cardiology as scheduled. Call with any concerns.        Follow up plan: Return for As scheduled.

## 2023-10-18 LAB — CBC WITH DIFFERENTIAL/PLATELET
Basophils Absolute: 0 10*3/uL (ref 0.0–0.2)
Basos: 0 %
EOS (ABSOLUTE): 0 10*3/uL (ref 0.0–0.4)
Eos: 1 %
Hematocrit: 33.1 % — ABNORMAL LOW (ref 34.0–46.6)
Hemoglobin: 10.2 g/dL — ABNORMAL LOW (ref 11.1–15.9)
Immature Grans (Abs): 0 10*3/uL (ref 0.0–0.1)
Immature Granulocytes: 0 %
Lymphocytes Absolute: 0.6 10*3/uL — ABNORMAL LOW (ref 0.7–3.1)
Lymphs: 22 %
MCH: 26.5 pg — ABNORMAL LOW (ref 26.6–33.0)
MCHC: 30.8 g/dL — ABNORMAL LOW (ref 31.5–35.7)
MCV: 86 fL (ref 79–97)
Monocytes Absolute: 0.2 10*3/uL (ref 0.1–0.9)
Monocytes: 9 %
Neutrophils Absolute: 1.8 10*3/uL (ref 1.4–7.0)
Neutrophils: 68 %
RBC: 3.85 x10E6/uL (ref 3.77–5.28)
RDW: 16.2 % — ABNORMAL HIGH (ref 11.7–15.4)
WBC: 2.6 10*3/uL — ABNORMAL LOW (ref 3.4–10.8)

## 2023-10-18 LAB — BASIC METABOLIC PANEL
BUN/Creatinine Ratio: 26 (ref 12–28)
BUN: 31 mg/dL — ABNORMAL HIGH (ref 8–27)
CO2: 23 mmol/L (ref 20–29)
Calcium: 9.3 mg/dL (ref 8.7–10.3)
Chloride: 102 mmol/L (ref 96–106)
Creatinine, Ser: 1.21 mg/dL — ABNORMAL HIGH (ref 0.57–1.00)
Glucose: 89 mg/dL (ref 70–99)
Potassium: 3.8 mmol/L (ref 3.5–5.2)
Sodium: 139 mmol/L (ref 134–144)
eGFR: 50 mL/min/{1.73_m2} — ABNORMAL LOW (ref >59)

## 2023-10-18 LAB — IRON AND TIBC
Iron Saturation: 10 % — ABNORMAL LOW (ref 15–55)
Iron: 27 ug/dL (ref 27–139)
Total Iron Binding Capacity: 281 ug/dL (ref 250–450)
UIBC: 254 ug/dL (ref 118–369)

## 2023-10-18 LAB — FERRITIN: Ferritin: 309 ng/mL — ABNORMAL HIGH (ref 15–150)

## 2023-10-20 DIAGNOSIS — Z7901 Long term (current) use of anticoagulants: Secondary | ICD-10-CM | POA: Diagnosis not present

## 2023-10-20 DIAGNOSIS — I10 Essential (primary) hypertension: Secondary | ICD-10-CM | POA: Diagnosis not present

## 2023-10-20 DIAGNOSIS — E7801 Familial hypercholesterolemia: Secondary | ICD-10-CM | POA: Diagnosis not present

## 2023-10-20 DIAGNOSIS — I480 Paroxysmal atrial fibrillation: Secondary | ICD-10-CM | POA: Diagnosis not present

## 2023-10-26 ENCOUNTER — Ambulatory Visit: Payer: Medicare HMO | Admitting: Family Medicine

## 2023-10-26 ENCOUNTER — Encounter: Payer: Self-pay | Admitting: Family Medicine

## 2023-10-27 LAB — INTELLIGEN MYELOID

## 2023-10-27 NOTE — Progress Notes (Signed)
This encounter was created in error - please disregard.

## 2023-10-28 ENCOUNTER — Inpatient Hospital Stay: Payer: Medicare HMO | Attending: Internal Medicine | Admitting: Internal Medicine

## 2023-10-28 ENCOUNTER — Inpatient Hospital Stay: Payer: Medicare HMO

## 2023-10-28 ENCOUNTER — Encounter: Payer: Self-pay | Admitting: Internal Medicine

## 2023-10-28 DIAGNOSIS — D72819 Decreased white blood cell count, unspecified: Secondary | ICD-10-CM | POA: Diagnosis not present

## 2023-10-28 DIAGNOSIS — Z87891 Personal history of nicotine dependence: Secondary | ICD-10-CM | POA: Insufficient documentation

## 2023-10-28 DIAGNOSIS — D72810 Lymphocytopenia: Secondary | ICD-10-CM | POA: Diagnosis not present

## 2023-10-28 DIAGNOSIS — D759 Disease of blood and blood-forming organs, unspecified: Secondary | ICD-10-CM | POA: Diagnosis not present

## 2023-10-28 DIAGNOSIS — D709 Neutropenia, unspecified: Secondary | ICD-10-CM

## 2023-10-28 DIAGNOSIS — D649 Anemia, unspecified: Secondary | ICD-10-CM | POA: Insufficient documentation

## 2023-10-28 DIAGNOSIS — D638 Anemia in other chronic diseases classified elsewhere: Secondary | ICD-10-CM

## 2023-10-28 DIAGNOSIS — D696 Thrombocytopenia, unspecified: Secondary | ICD-10-CM | POA: Diagnosis not present

## 2023-10-28 LAB — IRON AND TIBC
Iron: 45 ug/dL (ref 28–170)
Saturation Ratios: 13 % (ref 10.4–31.8)
TIBC: 358 ug/dL (ref 250–450)
UIBC: 313 ug/dL

## 2023-10-28 LAB — CBC WITH DIFFERENTIAL (CANCER CENTER ONLY)
Abs Immature Granulocytes: 0.02 10*3/uL (ref 0.00–0.07)
Basophils Absolute: 0 10*3/uL (ref 0.0–0.1)
Basophils Relative: 0 %
Eosinophils Absolute: 0 10*3/uL (ref 0.0–0.5)
Eosinophils Relative: 1 %
HCT: 34.9 % — ABNORMAL LOW (ref 36.0–46.0)
Hemoglobin: 10.9 g/dL — ABNORMAL LOW (ref 12.0–15.0)
Immature Granulocytes: 1 %
Lymphocytes Relative: 17 %
Lymphs Abs: 0.5 10*3/uL — ABNORMAL LOW (ref 0.7–4.0)
MCH: 26.3 pg (ref 26.0–34.0)
MCHC: 31.2 g/dL (ref 30.0–36.0)
MCV: 84.3 fL (ref 80.0–100.0)
Monocytes Absolute: 0.2 10*3/uL (ref 0.1–1.0)
Monocytes Relative: 6 %
Neutro Abs: 2.1 10*3/uL (ref 1.7–7.7)
Neutrophils Relative %: 75 %
Platelet Count: 121 10*3/uL — ABNORMAL LOW (ref 150–400)
RBC: 4.14 MIL/uL (ref 3.87–5.11)
RDW: 15.9 % — ABNORMAL HIGH (ref 11.5–15.5)
WBC Count: 2.8 10*3/uL — ABNORMAL LOW (ref 4.0–10.5)
nRBC: 0 % (ref 0.0–0.2)

## 2023-10-28 LAB — FERRITIN: Ferritin: 165 ng/mL (ref 11–307)

## 2023-10-28 LAB — BASIC METABOLIC PANEL
Anion gap: 9 (ref 5–15)
BUN: 27 mg/dL — ABNORMAL HIGH (ref 8–23)
CO2: 24 mmol/L (ref 22–32)
Calcium: 9.2 mg/dL (ref 8.9–10.3)
Chloride: 102 mmol/L (ref 98–111)
Creatinine, Ser: 0.89 mg/dL (ref 0.44–1.00)
GFR, Estimated: >60 mL/min (ref >60)
Glucose, Bld: 95 mg/dL (ref 70–99)
Potassium: 3.5 mmol/L (ref 3.5–5.1)
Sodium: 135 mmol/L (ref 135–145)

## 2023-10-28 NOTE — Progress Notes (Signed)
[facility] CONSULT NOTE  Patient Care Team: Dorcas Carrow, DO as PCP - General (Family Medicine) Lady Gary Darlin Priestly, MD as Consulting Physician (Cardiology) Bud Face, MD as Referring Physician (Otolaryngology) Minor, Theadora Rama, RN (Inactive) as [facility] Care Management Donneta Romberg, Worthy Flank, MD as Consulting Physician (Hematology and Oncology)  CHIEF COMPLAINTS/PURPOSE OF CONSULTATION:   # Anemia of chronic disease/mild intermittent leucopenia/thromboctyopenia? Autoimmune- EGD/ [place] [Dr.Wohl; 2017] chronic back pain [? 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   HISTORY OF PRESENTING ILLNESS: Patient is alone.  Ambulating independently.  Kristina Henson 68 y.o.  female mild to moderate anemia/intermittent thrombocytopenia/leukopenia of Unclear etiology- is here for follow-up.  No blood in stools or black or stools.  She continues to be on p.o. iron once a day.  Denies any worsening fatigue or lightheadedness or falls.  Review of Systems  Constitutional:  Positive for malaise/fatigue. Negative for chills, diaphoresis, fever and weight loss.  HENT:  Negative for nosebleeds and sore throat.   Eyes:  Negative for double vision.  Respiratory:  Negative for cough, hemoptysis, sputum production, shortness of breath and wheezing.   Cardiovascular:  Negative for chest pain, palpitations, orthopnea and leg swelling.  Gastrointestinal:  Negative for abdominal pain, blood in stool, constipation, diarrhea, heartburn, melena, nausea and vomiting.  Genitourinary:  Negative for dysuria, frequency and urgency.  Musculoskeletal:  Positive for joint pain. Negative for back pain.  Skin: Negative.  Negative for itching and rash.   Neurological:  Negative for dizziness, tingling, focal weakness, weakness and headaches.  Endo/Heme/Allergies:  Does not bruise/bleed easily.  Psychiatric/Behavioral:  Negative for depression. The patient is not nervous/anxious and does not have insomnia.      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   Atrial fibrillation (HCC)    a.) CHA2DS2-VASc = 5 (sex, HTN, CVA x 2, aortic plaque). b.) s/p ablation in 2008. c.) rate/rhythm maintained on oral amiodarone; not currently on daily anticoagulation.   Coronary artery disease    Duodenitis    Gastritis    HLD (hyperlipidemia)    Hypertension    IDA (iron deficiency anemia)    LBBB (left bundle branch block)    Mass of left submandibular region 04/29/2016   a.) FNA 04/29/2016 --> Bx (+) for pleomorphic adenoma; s/p resection   PAH (pulmonary artery hypertension) (HCC) 07/24/2020   a.) CT 07/24/2020 showed enlarged pulmonary artery consistent with PAH.   Pneumonia 2016   Sleep apnea    Stroke [facility]) 2012    SURGICAL HISTORY: Past Surgical History:  Procedure Laterality Date   BIOPSY SALIVARY GLAND Left 04/29/2016   Procedure: SUBMANDIBULAR GLAND BIOPSY (FNA); Location: [facility]; Surgeon: Richarda Overlie, MD (IR)   cardiac catherization     CARDIAC ELECTROPHYSIOLOGY STUDY AND ABLATION N/A 2008   Procedure: ATRIAL FIBRILLATION ABLATION; Location: [facility]   COLONOSCOPY WITH PROPOFOL N/A 11/18/2015   Procedure: COLONOSCOPY WITH PROPOFOL;  Surgeon: Midge Minium, MD;  Location: [facility] ENDOSCOPY;  Service: Endoscopy;  Laterality: N/A;   COLONOSCOPY WITH PROPOFOL N/A 12/02/2020   Procedure: COLONOSCOPY WITH PROPOFOL;  Surgeon: Midge Minium, MD;  Location: [facility] ENDOSCOPY;  Service: Endoscopy;  Laterality: N/A;   CORONARY ANGIOPLASTY     CYSTOSCOPY WITH  BIOPSY N/A 11/30/2021   Procedure: CYSTOSCOPY WITH BLADDER BIOPSY;  Surgeon: Vanna Scotland, MD;  Location: [facility] ORS;  Service: Urology;  Laterality: N/A;   ESOPHAGOGASTRODUODENOSCOPY (EGD) WITH PROPOFOL N/A 04/23/2016   Procedure: ESOPHAGOGASTRODUODENOSCOPY (EGD) WITH PROPOFOL;  Surgeon: Midge Minium, MD;  Location: [facility];  Service: Endoscopy;  Laterality: N/A;  small bowel bx gastric antrum bx   SUBMANDIBULAR GLAND EXCISION Left 09/08/2017   Procedure: EXCISION SUBMANDIBULAR GLAND;  Surgeon: Bud Face, MD;  Location: [facility] ORS;  Service: ENT;  Laterality: Left;   TOTAL ABDOMINAL HYSTERECTOMY W/ BILATERAL SALPINGOOPHORECTOMY     Total    SOCIAL HISTORY: Social History   Socioeconomic History   Marital status: Divorced    Spouse name: Not on file   Number of children: Not on file   Years of education: 12   Highest education level: 12th grade  Occupational History   Occupation: disability   Tobacco Use   Smoking status: Former    Current packs/day: 0.00    Types: Cigarettes    Quit date: 1978    Years since quitting: 47.1   Smokeless tobacco: Never   Tobacco comments:    didnt smoke much   Vaping Use   Vaping status: Never Used  Substance and Sexual Activity   Alcohol use: Yes    Alcohol/week: 1.0 standard drink of alcohol    Types: 1 Glasses of wine per week    Comment: occasionally   Drug use: No   Sexual activity: Not Currently  Other Topics Concern   Not on file  Social History Narrative   Not on file   Social Drivers of Health   Financial Resource Strain: Low Risk  (11/16/2022)   Overall Financial Resource Strain (CARDIA)    Difficulty of Paying Living Expenses: Not hard at all  Food Insecurity: No Food Insecurity (11/16/2022)   Hunger Vital Sign    Worried About Running Out of Food in the Last Year: Never true    Ran Out of Food in the Last Year: Never true  Transportation Needs: No Transportation Needs (11/16/2022)   PRAPARE -  Administrator, Civil Service (Medical): No    Lack of Transportation (Non-Medical): No  Physical Activity: Insufficiently Active (11/16/2022)   Exercise Vital Sign    Days of Exercise per Week: 3 days    Minutes of Exercise per Session: 30 min  Stress: No Stress Concern Present (11/16/2022)   [employer] of Occupational Health - Occupational Stress Questionnaire    Feeling of Stress : Not at all  Social Connections: Moderately Isolated (11/16/2022)   Social Connection and Isolation Panel [NHANES]    Frequency of Communication with Friends and Family: More than three times a week    Frequency of Social Gatherings with Friends and Family: Once a week    Attends Religious Services: 1 to 4 times per year    Active Member of [employer] or Organizations: No    Attends Banker Meetings: Never    Marital Status: Divorced  Catering manager Violence: Not At Risk (11/16/2022)   Humiliation, Afraid, Rape, and Kick questionnaire    Fear of Current or Ex-Partner: No    Emotionally Abused: No    Physically Abused: No    Sexually Abused: No    FAMILY HISTORY: Family History  Problem Relation Age of  Onset   Arthritis Mother    Cancer Mother    Hypertension Sister    Heart attack Sister    Cancer Sister        breast cancer   Asthma Brother    Dementia Brother    Breast cancer Neg Hx    Kidney cancer Neg Hx    Bladder Cancer Neg Hx     ALLERGIES:  is allergic to amoxicillin.  MEDICATIONS:  Current Outpatient Medications  Medication Sig Dispense Refill   albuterol (VENTOLIN HFA) 108 (90 Base) MCG/ACT inhaler Inhale 2 puffs into the lungs every 6 (six) hours as needed for wheezing or shortness of breath. 8 g 3   amiodarone (PACERONE) 200 MG tablet Take 1 tablet (200 mg total) by mouth every morning. 30 tablet 0   amLODipine (NORVASC) 2.5 MG tablet Take 1 tablet (2.5 mg total) by mouth daily. 90 tablet 1   Budeson-Glycopyrrol-Formoterol (BREZTRI AEROSPHERE)  160-9-4.8 MCG/ACT AERO Inhale 2 puffs into the lungs 2 (two) times daily. 10.7 g 11   conjugated estrogens (PREMARIN) vaginal cream Apply one pea-sized amount around the opening of the urethra daily for 2 weeks, then 3 times weekly moving forward. 30 g 4   FEROSUL 325 (65 Fe) MG tablet Take 1 tablet (325 mg total) by mouth 3 (three) times daily with meals. 270 tablet 1   hydrALAZINE (APRESOLINE) 50 MG tablet Take 1 tablet (50 mg total) by mouth 2 (two) times daily. 180 tablet 1   lisinopril-hydrochlorothiazide (ZESTORETIC) 20-25 MG tablet Take 1 tablet by mouth 2 (two) times daily. 180 tablet 1   metoprolol tartrate (LOPRESSOR) 25 MG tablet Take 0.5 tablets (12.5 mg total) by mouth 2 (two) times daily.     rosuvastatin (CRESTOR) 5 MG tablet Take 1 tablet (5 mg total) by mouth daily. 90 tablet 1   traZODone (DESYREL) 50 MG tablet Take 1-2 tablets (50-100 mg total) by mouth at bedtime as needed for sleep. 180 tablet 1   warfarin (COUMADIN) 2 MG tablet Take by mouth.     No current facility-administered medications for this visit.      Marland Kitchen  PHYSICAL EXAMINATION: ECOG PERFORMANCE STATUS: 1 - Symptomatic but completely ambulatory  Vitals:   10/28/23 1405  BP: (!) 146/69  Pulse: (!) 54  Temp: 97.9 F (36.6 C)  SpO2: 98%    Filed Weights   10/28/23 1405  Weight: 168 lb 3.2 oz (76.3 kg)     Physical Exam HENT:     Head: Normocephalic and atraumatic.     Mouth/Throat:     Pharynx: No oropharyngeal exudate.  Eyes:     Pupils: Pupils are equal, round, and reactive to light.  Cardiovascular:     Rate and Rhythm: Normal rate and regular rhythm.  Pulmonary:     Effort: No respiratory distress.     Breath sounds: No wheezing.  Abdominal:     General: Bowel sounds are normal. There is no distension.     Palpations: Abdomen is soft. There is no mass.     Tenderness: There is no abdominal tenderness. There is no guarding or rebound.  Musculoskeletal:        General: No tenderness.  Normal range of motion.     Cervical back: Normal range of motion and neck supple.  Skin:    General: Skin is warm.  Neurological:     Mental Status: She is alert and oriented to person, place, and time.  Psychiatric:  Mood and Affect: Affect normal.      LABORATORY DATA:  I have reviewed the data as listed Lab Results  Component Value Date   WBC 2.8 (L) 10/28/2023   HGB 10.9 (L) 10/28/2023   HCT 34.9 (L) 10/28/2023   MCV 84.3 10/28/2023   PLT 121 (L) 10/28/2023   Recent Labs    12/21/22 1233 04/06/23 1056 06/23/23 1044 08/10/23 1129 10/17/23 1515 10/28/23 1406  NA 137  --  136 141 139 135  K 3.4*  --  3.6 3.8 3.8 3.5  CL 102  --  102 101 102 102  CO2 26  --  26 22 23 24   GLUCOSE 113*  --  106* 89 89 95  BUN 22  --  23 17 31* 27*  CREATININE 0.92   < > 0.85 0.94 1.21* 0.89  CALCIUM 9.0  --  9.0 9.3 9.3 9.2  GFRNONAA >60  --  >60  --   --  >60  PROT  --   --   --  7.1  --   --   ALBUMIN  --   --   --  4.1  --   --   AST  --   --   --  47*  --   --   ALT  --   --   --  48*  --   --   ALKPHOS  --   --   --  85  --   --   BILITOT  --   --   --  0.6  --   --    < > = values in this interval not displayed.    RADIOGRAPHIC STUDIES: I have personally reviewed the radiological images as listed and agreed with the findings in the report. No results found.   ASSESSMENT & PLAN:   Anemia due to chronic illness # Idiopathic cytopenia of undetermined significance [bone marrow biopsy-2021 mild dyspoiesis; SNP-no multiple abnormalities. JAN 2025-]Intelli-Myeloid panel- pending  #  Anemia- likely IDA vs anemia of chronic disease  [unclear etiology]- Hxs/p IV iron; improved. Hb ~12.02 Oct 2023- [ferritin309; iron sat-10 %- SEP 2024]; continue PO iron once a day.  Hold any IV iron. Over all stable.   #Mild asymptomatic leukopenia/intermittent neutropenia-Lymphopenia/ Mild intermittent thrombocytopenia-today > 100 stable.   # DISPOSITION:  # no infusions today # follow  up in 6 months-MD/cbc/bmp/iron studies/ferritin; possible Venofer-- Dr.B   All questions were answered. The patient knows to call the clinic with any problems, questions or concerns.     Earna Coder, MD 10/28/2023 11:19 PM

## 2023-10-28 NOTE — Progress Notes (Signed)
Fatigue/weakness: no Dyspena: yes Light headedness: no  Blood in stool: no 

## 2023-10-28 NOTE — Assessment & Plan Note (Addendum)
#   Idiopathic cytopenia of undetermined significance [bone marrow biopsy-2021 mild dyspoiesis; SNP-no multiple abnormalities. JAN 2025-]Intelli-Myeloid panel- pending  #  Anemia- likely IDA vs anemia of chronic disease  [unclear etiology]- Hxs/p IV iron; improved. Hb ~12.02 Oct 2023- [ferritin309; iron sat-10 %- SEP 2024]; continue PO iron once a day.  Hold any IV iron. Over all stable.   #Mild asymptomatic leukopenia/intermittent neutropenia-Lymphopenia/ Mild intermittent thrombocytopenia-today > 100 stable.   # DISPOSITION:  # no infusions today # follow up in 6 months-MD/cbc/bmp/iron studies/ferritin; possible Venofer-- Dr.B

## 2023-11-08 ENCOUNTER — Ambulatory Visit: Payer: Medicare HMO | Admitting: Pediatrics

## 2023-11-08 ENCOUNTER — Encounter: Payer: Self-pay | Admitting: Pediatrics

## 2023-11-08 VITALS — BP 137/75 | HR 54 | Temp 97.7°F | Wt 168.2 lb

## 2023-11-08 DIAGNOSIS — H00015 Hordeolum externum left lower eyelid: Secondary | ICD-10-CM | POA: Diagnosis not present

## 2023-11-08 DIAGNOSIS — Z1330 Encounter for screening examination for mental health and behavioral disorders, unspecified: Secondary | ICD-10-CM

## 2023-11-08 MED ORDER — ERYTHROMYCIN 5 MG/GM OP OINT: 1.0000 | TOPICAL_OINTMENT | Freq: Two times a day (BID) | OPHTHALMIC | 0 refills | Status: AC

## 2023-11-08 NOTE — Progress Notes (Signed)
Office Visit  BP 137/75 (BP Location: Right Arm, Patient Position: Sitting, Cuff Size: Normal)   Pulse (!) 54   Temp 97.7 F (36.5 C) (Oral)   Wt 168 lb 3.2 oz (76.3 kg)   SpO2 92%   BMI 30.76 kg/m    Subjective:    Patient ID: Kristina Henson, female    DOB: 1955/10/01, 68 y.o.   MRN: [id number]  HPI: JOHANY Henson is a 68 y.o. female  Chief Complaint  Patient presents with   Eye Pain    Left eye has an bumps swollen annoyed started Friday night she felt something didn't feel like her eye very sore     Discussed the use of AI scribe software for clinical note transcription with the patient, who gave verbal consent to proceed.  History of Present Illness   Kristina Henson is a 68 year old female who presents with a left eye stye.  She has a stye in her left eye, which is a new occurrence for her. She experiences a sensation of a 'foggy thing' over the eye at times, but there is no pus discharge. The stye has caused some swelling, but there is no associated pain.  She has previously used eye drops for pink eye but has not used any specific treatment for the current stye.  She lives alone and has not been in contact with anyone else experiencing similar symptoms.     Relevant past medical, surgical, family and social history reviewed and updated as indicated. Interim medical history since our last visit reviewed. Allergies and medications reviewed and updated.  ROS per HPI unless specifically indicated above     Objective:    BP 137/75 (BP Location: Right Arm, Patient Position: Sitting, Cuff Size: Normal)   Pulse (!) 54   Temp 97.7 F (36.5 C) (Oral)   Wt 168 lb 3.2 oz (76.3 kg)   SpO2 92%   BMI 30.76 kg/m   Wt Readings from Last 3 Encounters:  11/08/23 168 lb 3.2 oz (76.3 kg)  10/28/23 168 lb 3.2 oz (76.3 kg)  10/26/23 167 lb 6.4 oz (75.9 kg)     Physical Exam Constitutional:      Appearance: Normal appearance.  Eyes:     General: Vision grossly intact.         Right eye: No discharge.        Left eye: Hordeolum present.No discharge.     Extraocular Movements:     Right eye: Normal extraocular motion.     Left eye: Normal extraocular motion.     Conjunctiva/sclera:     Right eye: Right conjunctiva is not injected.     Left eye: Left conjunctiva is not injected.  Pulmonary:     Effort: Pulmonary effort is normal.  Musculoskeletal:        General: Normal range of motion.  Skin:    Comments: Normal skin color  Neurological:     General: No focal deficit present.     Mental Status: She is alert. Mental status is at baseline.  Psychiatric:        Mood and Affect: Mood normal.        Behavior: Behavior normal.        Thought Content: Thought content normal.         11/08/2023    1:19 PM 08/10/2023   10:45 AM 02/07/2023   11:10 AM 12/23/2022    8:58 AM 11/16/2022    9:18 AM  Depression screen PHQ 2/9  Decreased Interest 0 0 0 0 0  Down, Depressed, Hopeless 0 0 0 0 0  PHQ - 2 Score 0 0 0 0 0  Altered sleeping 0  0 0 0  Tired, decreased energy 0  0 0 0  Change in appetite 0  0 0 0  Feeling bad or failure about yourself  0  0 0 0  Trouble concentrating 0  0 0 0  Moving slowly or fidgety/restless 0  0 0 0  Suicidal thoughts 0  0 0 0  PHQ-9 Score 0  0 0 0  Difficult doing work/chores    Not difficult at all Not difficult at all       11/08/2023    1:19 PM 02/07/2023   11:11 AM 12/23/2022    8:58 AM 08/09/2022   11:05 AM  GAD 7 : Generalized Anxiety Score  Nervous, Anxious, on Edge 0 0 0 0  Control/stop worrying 0 0 0 0  Worry too much - different things 0 0 0 0  Trouble relaxing 0 0 0 0  Restless 0 0 0 0  Easily annoyed or irritable 0 0 0 0  Afraid - awful might happen 0 0 0 0  Total GAD 7 Score 0 0 0 0  Anxiety Difficulty   Not difficult at all Not difficult at all       Assessment & Plan:  Assessment & Plan   Hordeolum externum of left lower eyelid First occurrence, no pain, no discharge, mild visual disturbance  described as "foggy". No history of similar issues in the household. Exam consistent with hordeolum of left eye. -Prescribe erythromycin ointment BID for 7 days. -Advise warm compresses. -Recommend a break from makeup until the stye clears. -Recommend ocusoft lid scrub/makeup wipes for future use to prevent recurrence.  -     Erythromycin; Place 1 Application into the left eye in the morning and at bedtime for 7 days.  Dispense: 14 g; Refill: 0  Encounter for behavioral health screening As part of their intake evaluation, the patient was screened for depression, anxiety.  PHQ9 SCORE 0, GAD7 SCORE 0. Screening results negative for tested conditions. CTM.  Follow up plan: Return if symptoms worsen or fail to improve.  Clarity Ciszek Howell Pringle, MD

## 2023-11-08 NOTE — Patient Instructions (Addendum)
Plan for your eyes: - avoid any make up to the eye while it resolves - be sure to get all make up off daily, I recommend ocusoft lid scrubs to prevent this (over the counter) - warm, moist compresses on the affected areas (for 5 to 10 minutes four times a day) to facilitate drainage    Please apply antibiotic ointment 1-2x per day for 7 days  Return if not improving

## 2023-11-14 LAB — INTELLIGEN MYELOID

## 2023-11-15 ENCOUNTER — Encounter: Payer: Self-pay | Admitting: Emergency Medicine

## 2023-11-22 ENCOUNTER — Ambulatory Visit
Admission: RE | Admit: 2023-11-22 | Discharge: 2023-11-22 | Disposition: A | Discharge status: Home / Self Care | Payer: Medicare HMO | Source: Ambulatory Visit | Attending: Family Medicine | Admitting: Family Medicine

## 2023-11-22 ENCOUNTER — Ambulatory Visit: Payer: Medicare HMO

## 2023-11-22 VITALS — Ht 62.0 in | Wt 168.0 lb

## 2023-11-22 DIAGNOSIS — Z1231 Encounter for screening mammogram for malignant neoplasm of breast: Secondary | ICD-10-CM | POA: Diagnosis not present

## 2023-11-22 DIAGNOSIS — Z0000 Encounter for general adult medical examination without abnormal findings: Secondary | ICD-10-CM | POA: Diagnosis not present

## 2023-11-22 NOTE — Progress Notes (Signed)
 Subjective:   Kristina Henson is a 68 y.o. who presents for a Medicare Wellness preventive visit.  Visit Complete: Virtual I connected with  Kristina Henson on 11/22/23 by a audio enabled telemedicine application and verified that I am speaking with the correct person using two identifiers.  Patient Location: Home  Provider Location: Office/Clinic  I discussed the limitations of evaluation and management by telemedicine. The patient expressed understanding and agreed to proceed.  Vital Signs: Because this visit was a virtual/telehealth visit, some criteria may be missing or patient reported. Any vitals not documented were not able to be obtained and vitals that have been documented are patient reported.  VideoDeclined- This patient declined Librarian, academic. Therefore the visit was completed with audio only.  AWV Questionnaire: No: Patient Medicare AWV questionnaire was not completed prior to this visit.  Cardiac Risk Factors include: advanced age (>52men, >26 women);dyslipidemia;obesity (BMI >30kg/m2);Other (see comment), Risk factor comments: OSA (no cpap)     Objective:    Today's Vitals   11/22/23 0803  Weight: 168 lb (76.2 kg)  Height: 5\' 2"  (1.575 m)   Body mass index is 30.73 kg/m.     11/22/2023    8:17 AM 10/28/2023    2:05 PM 06/27/2023   12:45 PM 12/21/2022   12:49 PM 11/16/2022    9:20 AM 06/22/2022    2:44 PM 02/11/2022   11:20 AM  Advanced Directives  Does Patient Have a Medical Advance Directive? Yes No No  No No No  Type of Estate agent of [place];Living will        Does patient want to make changes to medical advance directive? No - Patient declined        Copy of Healthcare Power of Attorney in Chart? No - copy requested        Would patient like information on creating a medical advance directive?  No - Patient declined No - Patient declined Yes (Inpatient - patient requests chaplain consult to create a  medical advance directive) No - Patient declined      Current Medications (verified) Outpatient Encounter Medications as of 11/22/2023  Medication Sig   albuterol (VENTOLIN HFA) 108 (90 Base) MCG/ACT inhaler Inhale 2 puffs into the lungs every 6 (six) hours as needed for wheezing or shortness of breath.   amiodarone (PACERONE) 200 MG tablet Take 1 tablet (200 mg total) by mouth every morning.   amLODipine (NORVASC) 2.5 MG tablet Take 1 tablet (2.5 mg total) by mouth daily.   Budeson-Glycopyrrol-Formoterol (BREZTRI AEROSPHERE) 160-9-4.8 MCG/ACT AERO Inhale 2 puffs into the lungs 2 (two) times daily.   conjugated estrogens (PREMARIN) vaginal cream Apply one pea-sized amount around the opening of the urethra daily for 2 weeks, then 3 times weekly moving forward.   FEROSUL 325 (65 Fe) MG tablet Take 1 tablet (325 mg total) by mouth 3 (three) times daily with meals.   hydrALAZINE (APRESOLINE) 50 MG tablet Take 1 tablet (50 mg total) by mouth 2 (two) times daily.   lisinopril-hydrochlorothiazide (ZESTORETIC) 20-25 MG tablet Take 1 tablet by mouth 2 (two) times daily.   metoprolol tartrate (LOPRESSOR) 25 MG tablet Take 0.5 tablets (12.5 mg total) by mouth 2 (two) times daily.   rosuvastatin (CRESTOR) 5 MG tablet Take 1 tablet (5 mg total) by mouth daily.   traZODone (DESYREL) 50 MG tablet Take 1-2 tablets (50-100 mg total) by mouth at bedtime as needed for sleep.   warfarin (COUMADIN) 2 MG  tablet Take by mouth.   No facility-administered encounter medications on file as of 11/22/2023.    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   Atrial fibrillation (HCC)    a.) CHA2DS2-VASc = 5 (sex, HTN, CVA x 2, aortic plaque). b.) s/p ablation in 2008. c.) rate/rhythm maintained on oral amiodarone; not currently on  daily anticoagulation.   Coronary artery disease    Duodenitis    Gastritis    HLD (hyperlipidemia)    Hypertension    IDA (iron deficiency anemia)    LBBB (left bundle branch block)    Mass of left submandibular region 04/29/2016   a.) FNA 04/29/2016 --> Bx (+) for pleomorphic adenoma; s/p resection   PAH (pulmonary artery hypertension) (HCC) 07/24/2020   a.) CT 07/24/2020 showed enlarged pulmonary artery consistent with PAH.   Pneumonia 2016   Sleep apnea    Stroke [facility]) 2012   Past Surgical History:  Procedure Laterality Date   BIOPSY SALIVARY GLAND Left 04/29/2016   Procedure: SUBMANDIBULAR GLAND BIOPSY (FNA); Location: [facility]; Surgeon: Richarda Overlie, MD (IR)   cardiac catherization     CARDIAC ELECTROPHYSIOLOGY STUDY AND ABLATION N/A 2008   Procedure: ATRIAL FIBRILLATION ABLATION; Location: [facility]   COLONOSCOPY WITH PROPOFOL N/A 11/18/2015   Procedure: COLONOSCOPY WITH PROPOFOL;  Surgeon: Midge Minium, MD;  Location: [facility] ENDOSCOPY;  Service: Endoscopy;  Laterality: N/A;   COLONOSCOPY WITH PROPOFOL N/A 12/02/2020   Procedure: COLONOSCOPY WITH PROPOFOL;  Surgeon: Midge Minium, MD;  Location: [facility] ENDOSCOPY;  Service: Endoscopy;  Laterality: N/A;   CORONARY ANGIOPLASTY     CYSTOSCOPY WITH BIOPSY N/A 11/30/2021   Procedure: CYSTOSCOPY WITH BLADDER BIOPSY;  Surgeon: Vanna Scotland, MD;  Location: [facility] ORS;  Service: Urology;  Laterality: N/A;   ESOPHAGOGASTRODUODENOSCOPY (EGD) WITH PROPOFOL N/A 04/23/2016   Procedure: ESOPHAGOGASTRODUODENOSCOPY (EGD) WITH PROPOFOL;  Surgeon: Midge Minium, MD;  [facility];  Service: Endoscopy;  Laterality: N/A;  small bowel bx gastric antrum bx   SUBMANDIBULAR GLAND EXCISION Left 09/08/2017   Procedure: EXCISION SUBMANDIBULAR GLAND;  Surgeon: Bud Face, MD;  Location: [facility] ORS;  Service: ENT;  Laterality: Left;   TOTAL ABDOMINAL HYSTERECTOMY W/ BILATERAL SALPINGOOPHORECTOMY     Total   Family  History  Problem Relation Age of Onset   Arthritis Mother    Cancer Mother    Other Father        unknown medical history   Hypertension Sister    Heart attack Sister    Cancer Sister        breast cancer   Asthma Brother    Dementia Brother    Breast cancer Neg Hx    Kidney cancer Neg Hx    Bladder Cancer Neg Hx    Social History   Socioeconomic History   Marital status: Divorced    Spouse name: Not on file   Number of children: 1   Years of education: 12   Highest education level: 12th grade  Occupational History   Occupation: disability   Tobacco Use   Smoking status: Former    Current packs/day: 0.00    Average packs/day: 0.1 packs/day for 3.0 years (0.3 ttl pk-yrs)    Types: Cigarettes    Start date: 59    Quit date: 1978  Years since quitting: 47.1   Smokeless tobacco: Never   Tobacco comments:    didnt smoke much   Vaping Use   Vaping status: Never Used  Substance and Sexual Activity   Alcohol use: Yes    Alcohol/week: 1.0 standard drink of alcohol    Types: 1 Glasses of wine per week    Comment: occasionally   Drug use: No   Sexual activity: Not Currently  Other Topics Concern   Not on file  Social History Narrative   Not on file   Social Drivers of Health   Financial Resource Strain: Low Risk  (11/22/2023)   Overall Financial Resource Strain (CARDIA)    Difficulty of Paying Living Expenses: Not hard at all  Food Insecurity: No Food Insecurity (11/22/2023)   Hunger Vital Sign    Worried About Running Out of Food in the Last Year: Never true    Ran Out of Food in the Last Year: Never true  Transportation Needs: No Transportation Needs (11/22/2023)   PRAPARE - Administrator, Civil Service (Medical): No    Lack of Transportation (Non-Medical): No  Physical Activity: Inactive (11/22/2023)   Exercise Vital Sign    Days of Exercise per Week: 0 days    Minutes of Exercise per Session: 0 min  Stress: No Stress Concern Present (11/22/2023)    [employer] of Occupational Health - Occupational Stress Questionnaire    Feeling of Stress : Not at all  Social Connections: Socially Isolated (11/22/2023)   Social Connection and Isolation Panel [NHANES]    Frequency of Communication with Friends and Family: More than three times a week    Frequency of Social Gatherings with Friends and Family: More than three times a week    Attends Religious Services: Never    Database administrator or Organizations: No    Attends Engineer, structural: Never    Marital Status: Divorced    Tobacco Counseling Counseling given: Not Answered Tobacco comments: didnt smoke much     Clinical Intake:  Pre-visit preparation completed: Yes  Pain : No/denies pain     BMI - recorded: 30.73 Nutritional Status: BMI > 30  Obese Nutritional Risks: None Diabetes: No  How often do you need to have someone help you when you read instructions, pamphlets, or other written materials from your doctor or pharmacy?: 1 - Never  Interpreter Needed?: No  Information entered by :: Tora Kindred, CMA   Activities of Daily Living     11/22/2023    8:05 AM  In your present state of health, do you have any difficulty performing the following activities:  Hearing? 0  Vision? 0  Difficulty concentrating or making decisions? 0  Walking or climbing stairs? 0  Dressing or bathing? 0  Doing errands, shopping? 0  Preparing Food and eating ? N  Using the Toilet? N  In the past six months, have you accidently leaked urine? N  Do you have problems with loss of bowel control? N  Managing your Medications? N  Managing your Finances? N  Housekeeping or managing your Housekeeping? N    Patient Care Team: Dorcas Carrow, DO as PCP - General (Family Medicine) Lady Gary Darlin Priestly, MD as Consulting Physician (Cardiology) Bud Face, MD as Referring Physician (Otolaryngology) Minor, Theadora Rama, RN (Inactive) as [facility] Care  Management Donneta Romberg, Worthy Flank, MD as Consulting Physician (Hematology and Oncology)  Indicate any recent Medical Services you may have received from other  than [facility] providers in the past year (date may be approximate).     Assessment:   This is a routine wellness examination for Jackelyn.  Hearing/Vision screen Hearing Screening - Comments:: Denies hearing loss Vision Screening - Comments:: Last eye exam &gt;2 years, declined referral, included list of eye doctors in AVS   Goals Addressed             This Visit's Progress    Patient Stated       Go the to [facility] and exercise       Depression Screen     11/22/2023    8:15 AM 11/08/2023    1:19 PM 08/10/2023   10:45 AM 02/07/2023   11:10 AM 12/23/2022    8:58 AM 11/16/2022    9:18 AM 08/09/2022   11:04 AM  PHQ 2/9 Scores  PHQ - 2 Score 0 0 0 0 0 0 0  PHQ- 9 Score 0 0  0 0 0 0    Fall Risk     11/22/2023    8:19 AM 11/08/2023    1:19 PM 08/10/2023   10:45 AM 02/07/2023   11:11 AM 12/23/2022    8:58 AM  Fall Risk   Falls in the past year? 0 0 0 0 0  Number falls in past yr: 0 0 0 0 0  Injury with Fall? 0 0 0 0 0  Risk for fall due to : No Fall Risks No Fall Risks No Fall Risks No Fall Risks History of fall(s)  Follow up Falls prevention discussed;Falls evaluation completed Falls evaluation completed Falls evaluation completed  Falls evaluation completed    MEDICARE RISK AT HOME:  Medicare Risk at Home Any stairs in or around the home?: Yes If so, are there any without handrails?: No Home free of loose throw rugs in walkways, pet beds, electrical cords, etc?: Yes Adequate lighting in your home to reduce risk of falls?: Yes Life alert?: No Use of a cane, walker or w/c?: No Grab bars in the bathroom?: Yes Shower chair or bench in shower?: Yes Elevated toilet seat or a handicapped toilet?: No  TIMED UP AND GO:  Was the test performed?  No  Cognitive Function: 6CIT completed        11/22/2023    8:20 AM  11/16/2022    9:25 AM 11/07/2020    9:49 AM 10/16/2018   10:08 AM 10/03/2017    4:06 PM  6CIT Screen  What Year? 0 points 0 points 4 points 0 points 0 points  What month? 0 points 0 points 0 points 0 points 0 points  What time? 0 points 0 points 0 points 0 points 0 points  Count back from 20 0 points 0 points 2 points 0 points 0 points  Months in reverse 0 points 2 points 4 points 0 points 0 points  Repeat phrase 4 points 4 points 6 points 4 points 0 points  Total Score 4 points 6 points 16 points 4 points 0 points    Immunizations Immunization History  Administered Date(s) Administered   Influenza,inj,Quad PF,6+ Mos 06/25/2015, 06/16/2017, 06/09/2018, 07/08/2019   Influenza-Unspecified 06/29/2016, 05/28/2020, 07/11/2022, 05/29/2023   Moderna Covid-19 Fall Seasonal Vaccine 44yrs & older 11/08/2022   Moderna Covid-19 Vaccine Bivalent Booster 12yrs & up 07/29/2021   Moderna Sars-Covid-2 Vaccination 11/15/2019, 12/13/2019   PNEUMOCOCCAL CONJUGATE-20 02/18/2023   Pfizer(Comirnaty)Fall Seasonal Vaccine 12 years and older 08/10/2023   Pneumococcal Polysaccharide-23 07/29/2015   Respiratory Syncytial Virus Vaccine,Recomb Aduvanted(Arexvy) 11/08/2022  Td 01/27/2015    Screening Tests Health Maintenance  Topic Date Due   Zoster Vaccines- Shingrix (1 of 2) Never done   COVID-19 Vaccine (6 - 2024-25 season) 10/05/2023   MAMMOGRAM  11/17/2024   Medicare Annual Wellness (AWV)  11/21/2024   DTaP/Tdap/Td (2 - Tdap) 01/26/2025   DEXA SCAN  07/01/2025   Colonoscopy  12/03/2027   Pneumonia Vaccine 15+ Years old  Completed   INFLUENZA VACCINE  Completed   Hepatitis C Screening  Completed   HIV Screening  Completed   HPV VACCINES  Aged Out    Health Maintenance  Health Maintenance Due  Topic Date Due   Zoster Vaccines- Shingrix (1 of 2) Never done   COVID-19 Vaccine (6 - 2024-25 season) 10/05/2023   Health Maintenance Items Addressed: See Nurse Notes  Additional Screening:  Vision  Screening: Recommended annual ophthalmology exams for early detection of glaucoma and other disorders of the eye.  Dental Screening: Recommended annual dental exams for proper oral hygiene  Community Resource Referral / Chronic Care Management: CRR required this visit?  No   CCM required this visit?  No     Plan:     I have personally reviewed and noted the following in the patient's chart:   Medical and social history Use of alcohol, tobacco or illicit drugs  Current medications and supplements including opioid prescriptions. Patient is not currently taking opioid prescriptions. Functional ability and status Nutritional status Physical activity Advanced directives List of other physicians Hospitalizations, surgeries, and ER visits in previous 12 months Vitals Screenings to include cognitive, depression, and falls Referrals and appointments  In addition, I have reviewed and discussed with patient certain preventive protocols, quality metrics, and best practice recommendations. A written personalized care plan for preventive services as well as general preventive health recommendations were provided to patient.     Tora Kindred, CMA   11/22/2023   After Visit Summary: (Mail) Due to this being a telephonic visit, the after visit summary with patients personalized plan was offered to patient via mail   Notes:  6 CIT Score - 4 Needs shingles vaccines Needs eye exam, declined referral, included list of eye doctors in the AVS Has MMG scheduled for 11/22/23

## 2023-11-22 NOTE — Patient Instructions (Addendum)
 Kristina Henson , Thank you for taking time to come for your Medicare Wellness Visit. I appreciate your ongoing commitment to your health goals. Please review the following plan we discussed and let me know if I can assist you in the future.   Referrals/Orders/Follow-Ups/Clinician Recommendations: Get an eye exam at your earliest convenience I have included a list of eye doctors in the area. Get the shingles vaccine at your convenience.   This is a list of the screening recommended for you and due dates:  Health Maintenance  Topic Date Due   Zoster (Shingles) Vaccine (1 of 2) Never done   COVID-19 Vaccine (6 - 2024-25 season) 10/05/2023   Mammogram  11/17/2024   Medicare Annual Wellness Visit  11/21/2024   DTaP/Tdap/Td vaccine (2 - Tdap) 01/26/2025   DEXA scan (bone density measurement)  07/01/2025   Colon Cancer Screening  12/03/2027   Pneumonia Vaccine  Completed   Flu Shot  Completed   Hepatitis C Screening  Completed   HIV Screening  Completed   HPV Vaccine  Aged Out    Advanced directives: (Copy Requested) Please bring a copy of your health care power of attorney and living will to the office to be added to your chart at your convenience.  Next Medicare Annual Wellness Visit scheduled for next year: Yes, 11/27/24 @ 8:00am (phone visit)

## 2023-11-24 DIAGNOSIS — I480 Paroxysmal atrial fibrillation: Secondary | ICD-10-CM | POA: Diagnosis not present

## 2023-11-24 DIAGNOSIS — Z7901 Long term (current) use of anticoagulants: Secondary | ICD-10-CM | POA: Diagnosis not present

## 2023-11-28 ENCOUNTER — Encounter: Payer: Self-pay | Admitting: Family Medicine

## 2023-11-29 NOTE — Progress Notes (Signed)
 Printed and mailed

## 2023-11-29 NOTE — Progress Notes (Signed)
Results printed and mailed.   

## 2023-12-26 ENCOUNTER — Ambulatory Visit: Payer: Medicare HMO

## 2023-12-26 ENCOUNTER — Other Ambulatory Visit: Payer: Medicare HMO

## 2023-12-26 ENCOUNTER — Ambulatory Visit: Payer: Medicare HMO | Admitting: Internal Medicine

## 2024-01-19 ENCOUNTER — Ambulatory Visit: Payer: Self-pay

## 2024-01-19 NOTE — Telephone Encounter (Signed)
  Chief Complaint: back pain Symptoms: lower back pain radiating into hip, comes and goes Frequency: 4-5 days  Pertinent Negatives: NA Disposition: [] ED /[x] Urgent Care (no appt availability in office) / [] Appointment(In office/virtual)/ []  [facility]/ [] Home Care/ [] Refused Recommended Disposition /[] [facility]/ []  Follow-up with PCP Additional Notes: pt requesting rx for lower back pain. States she has tried Icy-hot, heating pad and Tylenol  but no relief. No appts with PCP office until 01/27/24. Suggested pt could go to Dr. Ammie Kallman but pt has to use transportation and unsure if would go to [place]. Recommended she could try UC, appt scheduled for tomorrow at 1115.   Copied from CRM [id number]. Topic: Appointments - Appointment Scheduling >> Jan 19, 2024  3:00 PM Emylou G wrote: Experience low back pain.Aaron Aaswould like prescription.Aaron Aas tried to schedule.Ephriam Hashimoto avail til May Reason for Disposition  [1] SEVERE back pain (e.g., excruciating, unable to do any normal activities) AND [2] not improved 2 hours after pain medicine  Answer Assessment - Initial Assessment Questions 1. ONSET: "When did the pain begin?"      Easter Sunday  2. LOCATION: "Where does it hurt?" (upper, mid or lower back)     Lower back  3. SEVERITY: "How bad is the pain?"  (e.g., Scale 1-10; mild, moderate, or severe)   - MILD (1-3): Doesn't interfere with normal activities.    - MODERATE (4-7): Interferes with normal activities or awakens from sleep.    - SEVERE (8-10): Excruciating pain, unable to do any normal activities.      9/10 4. PATTERN: "Is the pain constant?" (e.g., yes, no; constant, intermittent)      Comes and goes  5. RADIATION: "Does the pain shoot into your legs or somewhere else?"     Into hip  7. BACK OVERUSE:  "Any recent lifting of heavy objects, strenuous work or exercise?"     no 8. MEDICINES: "What have you taken so far for the pain?" (e.g., nothing, acetaminophen , NSAIDS)      Icy Hot and heating pad Tylenol   10. OTHER SYMPTOMS: "Do you have any other symptoms?" (e.g., fever, abdomen pain, burning with urination, blood in urine)  Protocols used: Back Pain-A-AH

## 2024-01-20 ENCOUNTER — Ambulatory Visit: Payer: Self-pay

## 2024-02-03 ENCOUNTER — Other Ambulatory Visit: Payer: Self-pay | Admitting: Family Medicine

## 2024-02-07 ENCOUNTER — Encounter: Payer: Self-pay | Admitting: Family Medicine

## 2024-02-07 ENCOUNTER — Ambulatory Visit (INDEPENDENT_AMBULATORY_CARE_PROVIDER_SITE_OTHER): Payer: Self-pay | Admitting: Family Medicine

## 2024-02-07 VITALS — BP 146/82 | HR 85 | Ht 62.0 in | Wt 168.0 lb

## 2024-02-07 DIAGNOSIS — I129 Hypertensive chronic kidney disease with stage 1 through stage 4 chronic kidney disease, or unspecified chronic kidney disease: Secondary | ICD-10-CM

## 2024-02-07 DIAGNOSIS — L602 Onychogryphosis: Secondary | ICD-10-CM | POA: Diagnosis not present

## 2024-02-07 DIAGNOSIS — D500 Iron deficiency anemia secondary to blood loss (chronic): Secondary | ICD-10-CM | POA: Diagnosis not present

## 2024-02-07 DIAGNOSIS — E782 Mixed hyperlipidemia: Secondary | ICD-10-CM

## 2024-02-07 MED ORDER — AMLODIPINE BESYLATE 2.5 MG PO TABS: 2.5000 mg | ORAL_TABLET | Freq: Every day | ORAL | 1 refills | Status: DC

## 2024-02-07 MED ORDER — TRAZODONE HCL 50 MG PO TABS: 50.0000 mg | ORAL_TABLET | Freq: Every evening | ORAL | 1 refills | Status: DC | PRN

## 2024-02-07 MED ORDER — BACLOFEN 10 MG PO TABS: 5.0000 mg | ORAL_TABLET | Freq: Every evening | ORAL | 0 refills | Status: DC | PRN

## 2024-02-07 MED ORDER — ROSUVASTATIN CALCIUM 5 MG PO TABS
5.0000 mg | ORAL_TABLET | Freq: Every day | ORAL | 1 refills | Status: DC
Start: 2024-02-07 — End: 2024-08-09

## 2024-02-07 MED ORDER — METOPROLOL TARTRATE 25 MG PO TABS: 12.5000 mg | ORAL_TABLET | Freq: Two times a day (BID) | ORAL | 1 refills | Status: DC

## 2024-02-07 MED ORDER — LISINOPRIL-HYDROCHLOROTHIAZIDE 20-25 MG PO TABS: 1.0000 | ORAL_TABLET | Freq: Two times a day (BID) | ORAL | 1 refills | Status: DC

## 2024-02-07 MED ORDER — FEROSUL 325 (65 FE) MG PO TABS: 325.0000 mg | ORAL_TABLET | Freq: Three times a day (TID) | ORAL | 1 refills | Status: DC

## 2024-02-07 MED ORDER — HYDRALAZINE HCL 50 MG PO TABS: 50.0000 mg | ORAL_TABLET | Freq: Two times a day (BID) | ORAL | 1 refills | Status: DC

## 2024-02-07 NOTE — Assessment & Plan Note (Signed)
 Under good control on current regimen. Continue current regimen. Continue to monitor. Call with any concerns. Refills given. Labs drawn today.

## 2024-02-07 NOTE — Progress Notes (Signed)
 BP (!) 146/82   Pulse 85   Ht 5\' 2"  (1.575 m)   Wt 168 lb (76.2 kg)   SpO2 97%   BMI 30.73 kg/m    Subjective:    Patient ID: Kristina Henson, female    DOB: 11-27-1955, 68 y.o.   MRN: [id number]  HPI: Kristina Henson is a 68 y.o. female  Chief Complaint  Patient presents with   Hypertension   HYPERTENSION / HYPERLIPIDEMIA- didn't take her medicine today Satisfied with current treatment? yes Duration of hypertension: chronic BP monitoring frequency: rarely BP medication side effects: no Past BP meds: metoprolol , lisinopril , hydrochlorothiazide , hydralazine , amlodipine  Duration of hyperlipidemia: chronic Cholesterol medication side effects: no Cholesterol supplements: none Past cholesterol medications: crestor  Medication compliance: good compliance Aspirin: no Recent stressors: no Recurrent headaches: no Visual changes: no Palpitations: no Dyspnea: no Chest pain: no Lower extremity edema: no Dizzy/lightheaded: no  Relevant past medical, surgical, family and social history reviewed and updated as indicated. Interim medical history since our last visit reviewed. Allergies and medications reviewed and updated.  Review of Systems  Constitutional: Negative.   Respiratory: Negative.    Cardiovascular: Negative.   Musculoskeletal: Negative.   Neurological: Negative.   Psychiatric/Behavioral: Negative.      Per HPI unless specifically indicated above     Objective:     BP (!) 146/82   Pulse 85   Ht 5\' 2"  (1.575 m)   Wt 168 lb (76.2 kg)   SpO2 97%   BMI 30.73 kg/m   Wt Readings from Last 3 Encounters:  02/07/24 168 lb (76.2 kg)  11/22/23 168 lb (76.2 kg)  11/08/23 168 lb 3.2 oz (76.3 kg)    Physical Exam Vitals and nursing note reviewed.  Constitutional:      General: She is not in acute distress.    Appearance: Normal appearance. She is not ill-appearing, toxic-appearing or diaphoretic.  HENT:     Head: Normocephalic and atraumatic.     Right Ear:  External ear normal.     Left Ear: External ear normal.     Nose: Nose normal.     Mouth/Throat:     Mouth: Mucous membranes are moist.     Pharynx: Oropharynx is clear.  Eyes:     General: No scleral icterus.       Right eye: No discharge.        Left eye: No discharge.     Extraocular Movements: Extraocular movements intact.     Conjunctiva/sclera: Conjunctivae normal.     Pupils: Pupils are equal, round, and reactive to light.  Cardiovascular:     Rate and Rhythm: Normal rate and regular rhythm.     Pulses: Normal pulses.     Heart sounds: Normal heart sounds. No murmur heard.    No friction rub. No gallop.  Pulmonary:     Effort: Pulmonary effort is normal. No respiratory distress.     Breath sounds: Normal breath sounds. No stridor. No wheezing, rhonchi or rales.  Chest:     Chest wall: No tenderness.  Musculoskeletal:        General: Normal range of motion.     Cervical back: Normal range of motion and neck supple.  Skin:    General: Skin is warm and dry.     Capillary Refill: Capillary refill takes less than 2 seconds.     Coloration: Skin is not jaundiced or pale.     Findings: No bruising, erythema, lesion or rash.  Neurological:  General: No focal deficit present.     Mental Status: She is alert and oriented to person, place, and time. Mental status is at baseline.  Psychiatric:        Mood and Affect: Mood normal.        Behavior: Behavior normal.        Thought Content: Thought content normal.        Judgment: Judgment normal.     Results for orders placed or performed in visit on 10/28/23  Ferritin   Collection Time: 10/28/23  2:06 PM  Result Value Ref Range   Ferritin 165 11 - 307 ng/mL  Iron  and TIBC   Collection Time: 10/28/23  2:06 PM  Result Value Ref Range   Iron  45 28 - 170 ug/dL   TIBC 865 784 - 696 ug/dL   Saturation Ratios 13 10.4 - 31.8 %   UIBC 313 ug/dL  Basic metabolic panel   Collection Time: 10/28/23  2:06 PM  Result Value Ref  Range   Sodium 135 135 - 145 mmol/L   Potassium 3.5 3.5 - 5.1 mmol/L   Chloride 102 98 - 111 mmol/L   CO2 24 22 - 32 mmol/L   Glucose, Bld 95 70 - 99 mg/dL   BUN 27 (H) 8 - 23 mg/dL   Creatinine, Ser 2.95 0.44 - 1.00 mg/dL   Calcium  9.2 8.9 - 10.3 mg/dL   GFR, Estimated >28 >41 mL/min   Anion gap 9 5 - 15  CBC with Differential ([facility] Only)   Collection Time: 10/28/23  2:06 PM  Result Value Ref Range   WBC Count 2.8 (L) 4.0 - 10.5 K/uL   RBC 4.14 3.87 - 5.11 MIL/uL   Hemoglobin 10.9 (L) 12.0 - 15.0 g/dL   HCT 32.4 (L) 40.1 - 02.7 %   MCV 84.3 80.0 - 100.0 fL   MCH 26.3 26.0 - 34.0 pg   MCHC 31.2 30.0 - 36.0 g/dL   RDW 25.3 (H) 66.4 - 40.3 %   Platelet Count 121 (L) 150 - 400 K/uL   nRBC 0.0 0.0 - 0.2 %   Neutrophils Relative % 75 %   Neutro Abs 2.1 1.7 - 7.7 K/uL   Lymphocytes Relative 17 %   Lymphs Abs 0.5 (L) 0.7 - 4.0 K/uL   Monocytes Relative 6 %   Monocytes Absolute 0.2 0.1 - 1.0 K/uL   Eosinophils Relative 1 %   Eosinophils Absolute 0.0 0.0 - 0.5 K/uL   Basophils Relative 0 %   Basophils Absolute 0.0 0.0 - 0.1 K/uL   Immature Granulocytes 1 %   Abs Immature Granulocytes 0.02 0.00 - 0.07 K/uL  IntelliGEN Myeloid   Collection Time: 10/28/23  2:06 PM  Result Value Ref Range   Specimen Type Whole Blood    Clinical Indication Comment    RESULT SUMMARY Comment    METHODOLOGY Comment    REFERENCES Comment    DIRECTOR REVIEW Comment       Assessment & Plan:   Problem List Items Addressed This Visit       Genitourinary   Benign hypertensive renal disease - Primary   Running a bit high- did not take her medicine today. Will continue current regimen. Continue to monitor. Call with any concerns.       Relevant Orders   Comprehensive metabolic panel with GFR     Other   HLD (hyperlipidemia)   Under good control on current regimen. Continue current regimen. Continue to monitor. Call  with any concerns. Refills given. Labs drawn today.       Relevant  Medications   amLODipine  (NORVASC ) 2.5 MG tablet   hydrALAZINE  (APRESOLINE ) 50 MG tablet   lisinopril -hydrochlorothiazide  (ZESTORETIC ) 20-25 MG tablet   metoprolol  tartrate (LOPRESSOR ) 25 MG tablet   rosuvastatin  (CRESTOR ) 5 MG tablet   Other Relevant Orders   Comprehensive metabolic panel with GFR   Lipid Panel w/o Chol/HDL Ratio   Iron  deficiency anemia due to chronic blood loss   Rechecking labs today. Await results. Treat as needed.       Relevant Medications   FEROSUL 325 (65 Fe) MG tablet   Other Relevant Orders   CBC with Differential/Platelet   Iron  Binding Cap (TIBC)([facility])   Ferritin   Other Visit Diagnoses       Thickened nails       Referral to podiatry placed today.   Relevant Orders   Ambulatory referral to Podiatry        Follow up plan: Return in about 6 months (around 08/09/2024) for physical.

## 2024-02-07 NOTE — Assessment & Plan Note (Signed)
 Running a bit high- did not take her medicine today. Will continue current regimen. Continue to monitor. Call with any concerns.

## 2024-02-07 NOTE — Assessment & Plan Note (Signed)
 Rechecking labs today. Await results. Treat as needed.

## 2024-02-08 LAB — CBC WITH DIFFERENTIAL/PLATELET
Basophils Absolute: 0 10*3/uL (ref 0.0–0.2)
Basos: 1 %
EOS (ABSOLUTE): 0.1 10*3/uL (ref 0.0–0.4)
Eos: 2 %
Hematocrit: 36.7 % (ref 34.0–46.6)
Hemoglobin: 11.5 g/dL (ref 11.1–15.9)
Immature Grans (Abs): 0 10*3/uL (ref 0.0–0.1)
Immature Granulocytes: 1 %
Lymphocytes Absolute: 0.7 10*3/uL (ref 0.7–3.1)
Lymphs: 20 %
MCH: 26.3 pg — ABNORMAL LOW (ref 26.6–33.0)
MCHC: 31.3 g/dL — ABNORMAL LOW (ref 31.5–35.7)
MCV: 84 fL (ref 79–97)
Monocytes Absolute: 0.4 10*3/uL (ref 0.1–0.9)
Monocytes: 10 %
Neutrophils Absolute: 2.2 10*3/uL (ref 1.4–7.0)
Neutrophils: 66 %
Platelets: 98 10*3/uL — CL (ref 150–450)
RBC: 4.38 x10E6/uL (ref 3.77–5.28)
RDW: 16.7 % — ABNORMAL HIGH (ref 11.7–15.4)
WBC: 3.4 10*3/uL (ref 3.4–10.8)

## 2024-02-08 LAB — LIPID PANEL W/O CHOL/HDL RATIO
Cholesterol, Total: 131 mg/dL (ref 100–199)
HDL: 44 mg/dL (ref >39)
LDL Chol Calc (NIH): 70 mg/dL (ref 0–99)
Triglycerides: 91 mg/dL (ref 0–149)
VLDL Cholesterol Cal: 17 mg/dL (ref 5–40)

## 2024-02-08 LAB — COMPREHENSIVE METABOLIC PANEL WITH GFR
ALT: 63 IU/L — ABNORMAL HIGH (ref 0–32)
AST: 64 IU/L — ABNORMAL HIGH (ref 0–40)
Albumin: 4 g/dL (ref 3.9–4.9)
Alkaline Phosphatase: 80 IU/L (ref 44–121)
BUN/Creatinine Ratio: 29 — ABNORMAL HIGH (ref 12–28)
BUN: 24 mg/dL (ref 8–27)
Bilirubin Total: 0.6 mg/dL (ref 0.0–1.2)
CO2: 22 mmol/L (ref 20–29)
Calcium: 9.4 mg/dL (ref 8.7–10.3)
Chloride: 103 mmol/L (ref 96–106)
Creatinine, Ser: 0.84 mg/dL (ref 0.57–1.00)
Globulin, Total: 3.2 g/dL (ref 1.5–4.5)
Glucose: 98 mg/dL (ref 70–99)
Potassium: 4.1 mmol/L (ref 3.5–5.2)
Sodium: 142 mmol/L (ref 134–144)
Total Protein: 7.2 g/dL (ref 6.0–8.5)
eGFR: 77 mL/min/{1.73_m2} (ref >59)

## 2024-02-08 LAB — IRON AND TIBC
Iron Saturation: 8 % — CL (ref 15–55)
Iron: 28 ug/dL (ref 27–139)
Total Iron Binding Capacity: 331 ug/dL (ref 250–450)
UIBC: 303 ug/dL (ref 118–369)

## 2024-02-08 LAB — FERRITIN: Ferritin: 189 ng/mL — ABNORMAL HIGH (ref 15–150)

## 2024-02-10 ENCOUNTER — Ambulatory Visit: Payer: Self-pay | Admitting: Family Medicine

## 2024-02-13 NOTE — Telephone Encounter (Signed)
Result printed and mailed.

## 2024-03-28 ENCOUNTER — Other Ambulatory Visit: Payer: Self-pay

## 2024-03-28 ENCOUNTER — Ambulatory Visit: Payer: Self-pay

## 2024-03-28 ENCOUNTER — Emergency Department

## 2024-03-28 ENCOUNTER — Emergency Department
Admission: EM | Admit: 2024-03-28 | Discharge: 2024-03-28 | Disposition: A | Discharge status: Home / Self Care | Attending: Emergency Medicine | Admitting: Emergency Medicine

## 2024-03-28 DIAGNOSIS — W010XXA Fall on same level from slipping, tripping and stumbling without subsequent striking against object, initial encounter: Secondary | ICD-10-CM | POA: Insufficient documentation

## 2024-03-28 DIAGNOSIS — S8991XA Unspecified injury of right lower leg, initial encounter: Secondary | ICD-10-CM | POA: Diagnosis not present

## 2024-03-28 DIAGNOSIS — W19XXXA Unspecified fall, initial encounter: Secondary | ICD-10-CM

## 2024-03-28 DIAGNOSIS — J449 Chronic obstructive pulmonary disease, unspecified: Secondary | ICD-10-CM | POA: Diagnosis not present

## 2024-03-28 DIAGNOSIS — Z8673 Personal history of transient ischemic attack (TIA), and cerebral infarction without residual deficits: Secondary | ICD-10-CM | POA: Diagnosis not present

## 2024-03-28 DIAGNOSIS — S99921A Unspecified injury of right foot, initial encounter: Secondary | ICD-10-CM | POA: Insufficient documentation

## 2024-03-28 DIAGNOSIS — M79671 Pain in right foot: Secondary | ICD-10-CM | POA: Diagnosis present

## 2024-03-28 NOTE — ED Triage Notes (Signed)
 Patent to ED via [facility] from home. Patient had a mechanical fall last Wednesday. Patient did not hit her head or have LOC. Patient currently is complaining of R knee and R foot pain. R foot is swollen and bruised. Patient has been ambulatory since the accident.  Patient has Hx of HTN and afib.

## 2024-03-28 NOTE — ED Provider Notes (Signed)
 [facility] Provider Note    Event Date/Time   First MD Initiated Contact with Patient 03/28/24 1530     (approximate)   History   Fall   HPI  Kristina Henson is a 68 y.o. female who presents today for evaluation of right sided foot pain.  Patient reports that 1 week ago she tripped and fell and injured her foot and ankle.  She noted the following morning that she had bruising to her foot and proximal tib-fib.  She has been able to ambulate.  No numbness or tingling.  She noticed that the area was swollen prompting her to come in for evaluation.  There is no swelling prior to her fall.  She is anticoagulated.  There was no head strike or LOC.  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   Anemia due to chronic illness 04/30/2016   Iron  deficiency anemia due to chronic blood loss    Benign neoplasm of sigmoid colon    Benign hypertensive renal disease    Atrial fibrillation (HCC)    HLD (hyperlipidemia) 07/11/2014   History of CVA with residual deficit 07/11/2014          Physical Exam   Triage Vital Signs: ED Triage Vitals  Encounter Vitals Group     BP      Girls Systolic BP Percentile      Girls Diastolic BP Percentile      Boys Systolic BP Percentile      Boys Diastolic BP Percentile      Pulse      Resp      Temp      Temp src      SpO2      Weight      Height      Head Circumference      Peak Flow       Pain Score      Pain Loc      Pain Education      Exclude from Growth Chart     Most recent vital signs: Vitals:   03/28/24 1533 03/28/24 1705  BP: (!) 157/76 (!) 160/95  Pulse: 73 95  Resp: 16 16  Temp: 98.2 F (36.8 C) 98 F (36.7 C)  SpO2: 99% 98%    Physical Exam Vitals and nursing note reviewed.  Constitutional:      General: Awake and alert. No acute distress.    Appearance: Normal appearance. The patient is normal weight.  HENT:     Head: Normocephalic and atraumatic.     Mouth: Mucous membranes are moist.  Eyes:     General: PERRL. Normal EOMs        Right eye: No discharge.        Left eye: No discharge.     Conjunctiva/sclera: Conjunctivae normal.  Cardiovascular:     Rate and Rhythm: Normal rate.     Pulses: Normal pulses.  Pulmonary:  Effort: Pulmonary effort is normal. No respiratory distress.     Breath sounds: Normal breath sounds.  Abdominal:     Abdomen is soft. There is no abdominal tenderness. No rebound or guarding. No distention. Musculoskeletal:        General: No swelling. Normal range of motion.     Cervical back: Normal range of motion and neck supple.  Right lower extremity: Ecchymosis noted to the lateral aspect of tib-fib, no associated hematoma, no open wounds.  Compartments compressible throughout.  Faint ecchymosis noted to the medial and lateral aspect of the foot with swelling around the lateral and medial malleolus.  No medial or lateral malleolar tenderness.  No proximal fifth metatarsal tenderness.  2+ pedal pulses.  Ecchymosis noted to the plantar aspect of the foot as well.  Sensation intact light touch throughout.  Normal and full range of motion of the knee, no ligamental laxity, though mild tenderness to anterior knee. Skin:    General: Skin is warm and dry.     Capillary Refill: Capillary refill takes less than 2 seconds.     Findings: No rash.  Neurological:     Mental Status: The patient is awake and alert.       ED Results / Procedures / Treatments   Labs (all labs ordered are listed, but only abnormal results are displayed) Labs Reviewed - No data to display   EKG     RADIOLOGY I independently reviewed and interpreted imaging and agree with radiologists findings.     PROCEDURES:  Critical Care performed:   Procedures   MEDICATIONS ORDERED IN ED: Medications - No data to display   IMPRESSION / MDM / ASSESSMENT AND PLAN / ED COURSE  I reviewed the triage vital signs and the nursing notes.   Differential diagnosis includes, but is not limited to, fracture, ligamental injury, contusion.  Patient is awake and alert, hemodynamically stable and afebrile.  She is nontoxic in appearance.  She has normal 2+ pedal pulses, sensation intact to light touch throughout, normal capillary refill.  She has full and normal range of motion of her foot, ankle, and knee.  Patient has ecchymosis noted to the lateral aspect of the proximal tib-fib as well as to the medial and lateral aspect of the foot, just distal to the calcaneus.  No pain with calcaneal squeeze.  X-rays obtained and are negative for any acute bony injury.  Patient is reassured by these findings.  She was given an Ace wrap for extra support.  We discussed rest, ice, elevation and return precautions.  She was given the information for podiatry in case her symptoms persist.  She understands return precautions in the meantime.  She was discharged in stable condition   Patient's presentation is most consistent with acute complicated illness / injury requiring diagnostic workup.    FINAL CLINICAL IMPRESSION(S) / ED DIAGNOSES   Final diagnoses:  Fall, initial encounter  Injury of right foot, initial encounter  Injury of right knee, initial encounter     Rx / DC Orders   ED Discharge Orders     None        Note:  This document was prepared using Dragon voice recognition software and may include unintentional  dictation errors.   Ankita Newcomer E, PA-C 03/28/24 1711    Viviann Pastor, MD 03/28/24 [phone]

## 2024-03-28 NOTE — Telephone Encounter (Signed)
 FYI Only or Action Required?: Action required by provider: request for appointment.  Patient was last seen in primary care on 02/07/2024 by Vicci Duwaine SQUIBB, DO. Called Nurse Triage reporting Fall. Symptoms began a week ago. Interventions attempted: OTC medications: tylenol . Symptoms are: unchanged.  Triage Disposition: See PCP When Office is Open (Within 3 Days)  Patient/caregiver understands and will follow disposition?: YesCopied from CRM [id number]. Topic: Clinical - Red Word Triage >> Mar 28, 2024  1:59 PM Ivette P wrote: [facility] that prompted transfer to Nurse Triage:  last week, fell and messed right leg, a couple days foot has been swelled up. looks like its bruised on the side, right foot. Reason for Disposition  MILD weakness (i.e., does not interfere with ability to work, go to school, normal activities)  (Exception: Mild weakness is a chronic symptom.)  Answer Assessment - Initial Assessment Questions 1. MECHANISM: How did the fall happen?     Went to concert and tripped on uneven ground 2. DOMESTIC VIOLENCE AND ELDER ABUSE SCREENING: Did you fall because someone pushed you or tried to hurt you? If Yes, ask: Are you safe now?     Na  3. ONSET: When did the fall happen? (e.g., minutes, hours, or days ago)     1 week ago  4. LOCATION: What part of the body hit the ground? (e.g., back, buttocks, head, hips, knees, hands, head, stomach)     Right leg 5. INJURY: Did you hurt (injure) yourself when you fell? If Yes, ask: What did you injure? Tell me more about this? (e.g., body area; type of injury; pain severity)     Yes- right leg 6. PAIN: Is there any pain? If Yes, ask: How bad is the pain? (e.g., Scale 1-10; or mild,  moderate, severe)   - NONE (0): No pain   - MILD (1-3): Doesn't interfere with normal activities    - MODERATE (4-7): Interferes with normal activities or awakens from sleep    - SEVERE (8-10): Excruciating pain, unable to do any normal activities       7 7. SIZE: For cuts, bruises, or swelling, ask: How large is it? (e.g., inches or centimeters)      Part of right leg and foot   9. OTHER SYMPTOMS: Do you have any other symptoms? (e.g., dizziness, fever, weakness; new onset or worsening).      denies 10. CAUSE: What do you think caused the fall (or falling)? (e.g., tripped, dizzy spell)       Unlevel ground    Right foot is now swollen. Pt  elevating using  and icy hot. Pt is able to walk on it.  Protocols used: Falls and [facility]

## 2024-03-28 NOTE — Discharge Instructions (Signed)
 Your x-rays did not reveal any broken bones.  You may wear the Ace wrap during the day and remove it at night.  Please follow-up with your outpatient provider.  Please return for any new, worsening, or change in symptoms or other concerns.  It was a pleasure caring for you today.

## 2024-03-29 ENCOUNTER — Ambulatory Visit: Admitting: Family Medicine

## 2024-04-02 ENCOUNTER — Emergency Department
Admission: EM | Admit: 2024-04-02 | Discharge: 2024-04-02 | Disposition: A | Discharge status: Home / Self Care | Attending: Emergency Medicine | Admitting: Emergency Medicine

## 2024-04-02 ENCOUNTER — Encounter: Payer: Self-pay | Admitting: Emergency Medicine

## 2024-04-02 ENCOUNTER — Emergency Department

## 2024-04-02 ENCOUNTER — Other Ambulatory Visit: Payer: Self-pay

## 2024-04-02 DIAGNOSIS — Z7901 Long term (current) use of anticoagulants: Secondary | ICD-10-CM | POA: Insufficient documentation

## 2024-04-02 DIAGNOSIS — E876 Hypokalemia: Secondary | ICD-10-CM | POA: Diagnosis not present

## 2024-04-02 DIAGNOSIS — D72819 Decreased white blood cell count, unspecified: Secondary | ICD-10-CM | POA: Diagnosis not present

## 2024-04-02 DIAGNOSIS — R0602 Shortness of breath: Secondary | ICD-10-CM | POA: Diagnosis present

## 2024-04-02 DIAGNOSIS — R059 Cough, unspecified: Secondary | ICD-10-CM | POA: Diagnosis not present

## 2024-04-02 DIAGNOSIS — J441 Chronic obstructive pulmonary disease with (acute) exacerbation: Secondary | ICD-10-CM | POA: Insufficient documentation

## 2024-04-02 DIAGNOSIS — I10 Essential (primary) hypertension: Secondary | ICD-10-CM | POA: Diagnosis not present

## 2024-04-02 LAB — CBC
HCT: 36.8 % (ref 36.0–46.0)
Hemoglobin: 11.6 g/dL — ABNORMAL LOW (ref 12.0–15.0)
MCH: 26.7 pg (ref 26.0–34.0)
MCHC: 31.5 g/dL (ref 30.0–36.0)
MCV: 84.6 fL (ref 80.0–100.0)
Platelets: 107 K/uL — ABNORMAL LOW (ref 150–400)
RBC: 4.35 MIL/uL (ref 3.87–5.11)
RDW: 17.3 % — ABNORMAL HIGH (ref 11.5–15.5)
WBC: 2.9 K/uL — ABNORMAL LOW (ref 4.0–10.5)
nRBC: 0 % (ref 0.0–0.2)

## 2024-04-02 LAB — RESP PANEL BY RT-PCR (RSV, FLU A&B, COVID)  RVPGX2
Influenza A by PCR: NEGATIVE
Influenza B by PCR: NEGATIVE
Resp Syncytial Virus by PCR: NEGATIVE
SARS Coronavirus 2 by RT PCR: NEGATIVE

## 2024-04-02 LAB — BASIC METABOLIC PANEL WITH GFR
Anion gap: 12 (ref 5–15)
BUN: 20 mg/dL (ref 8–23)
CO2: 20 mmol/L — ABNORMAL LOW (ref 22–32)
Calcium: 9.1 mg/dL (ref 8.9–10.3)
Chloride: 105 mmol/L (ref 98–111)
Creatinine, Ser: 0.61 mg/dL (ref 0.44–1.00)
GFR, Estimated: >60 mL/min (ref >60)
Glucose, Bld: 105 mg/dL — ABNORMAL HIGH (ref 70–99)
Potassium: 3.4 mmol/L — ABNORMAL LOW (ref 3.5–5.1)
Sodium: 137 mmol/L (ref 135–145)

## 2024-04-02 LAB — PROTIME-INR
INR: 2.1 — ABNORMAL HIGH (ref 0.8–1.2)
Prothrombin Time: 24.4 s — ABNORMAL HIGH (ref 11.4–15.2)

## 2024-04-02 MED ORDER — DEXTROMETHORPHAN POLISTIREX ER 30 MG/5ML PO SUER
15.0000 mg | Freq: Once | ORAL | Status: AC
  Administered 2024-04-02: 15 mg via ORAL
  Filled 2024-04-02: qty 5

## 2024-04-02 MED ORDER — LEVOFLOXACIN 750 MG PO TABS: 750.0000 mg | ORAL_TABLET | Freq: Every day | ORAL | 0 refills | Status: AC

## 2024-04-02 MED ORDER — METHYLPREDNISOLONE SODIUM SUCC 125 MG IJ SOLR
125.0000 mg | Freq: Once | INTRAMUSCULAR | Status: AC
  Administered 2024-04-02: 125 mg via INTRAVENOUS
  Filled 2024-04-02: qty 2

## 2024-04-02 MED ORDER — SODIUM CHLORIDE 0.9 % IV BOLUS
1000.0000 mL | Freq: Once | INTRAVENOUS | Status: AC
  Administered 2024-04-02: 1000 mL via INTRAVENOUS

## 2024-04-02 MED ORDER — DEXTROMETHORPHAN HBR 15 MG/5ML PO SYRP: 10.0000 mL | ORAL_SOLUTION | Freq: Four times a day (QID) | ORAL | 0 refills | Status: DC | PRN

## 2024-04-02 MED ORDER — LEVOFLOXACIN 500 MG PO TABS
750.0000 mg | ORAL_TABLET | Freq: Once | ORAL | Status: AC
Start: 2024-04-02 — End: 2024-04-02
  Administered 2024-04-02: 750 mg via ORAL
  Filled 2024-04-02: qty 2

## 2024-04-02 MED ORDER — IPRATROPIUM-ALBUTEROL 0.5-2.5 (3) MG/3ML IN SOLN
3.0000 mL | Freq: Once | RESPIRATORY_TRACT | Status: AC
  Administered 2024-04-02: 3 mL via RESPIRATORY_TRACT
  Filled 2024-04-02: qty 3

## 2024-04-02 MED ORDER — PREDNISONE 10 MG PO TABS: 40.0000 mg | ORAL_TABLET | Freq: Every day | ORAL | 0 refills | Status: AC

## 2024-04-02 NOTE — ED Notes (Signed)
 Pt ambulated from her WC to nurses station to report she feels like her breathing is getting worse. Pt speaking in complete sentences. VS redone and are charted. Pt breathing slightly fast, charted as well. First RN notified.

## 2024-04-02 NOTE — ED Triage Notes (Signed)
 Patient to ED via POV for SOB. States cough, sneezing, and running nose. Started on Saturday. Hx COPD, afib, HTN

## 2024-04-02 NOTE — Discharge Instructions (Signed)
 You were evaluated in the ED today for your shortness of breath.  I suspect you have a COPD exacerbation that may be triggered by an infection in your lungs although no obvious pneumonia seen on chest x-ray and you are negative for COVID, flu, and RSV.  Your laboratory workup was otherwise reassuring here today.  I have started you on an antibiotic called levofloxacin  which she will take for the next 5 days.  You will also take a steroid once daily starting tomorrow for the next 4 days.  Please follow-up with your PCP in 3 to 4 days for reevaluation or return to the emergency department for any acute worsening symptoms.

## 2024-04-02 NOTE — ED Notes (Signed)
 Pt discharged to ED circle at this time and left with all belongings. Pt ABCs intact. RR even and unlabored. Pt in NAD. Pt denies further needs from this RN.

## 2024-04-02 NOTE — ED Provider Notes (Signed)
 [facility] Provider Note    Event Date/Time   First MD Initiated Contact with Patient 04/02/24 1716     (approximate)   History   Shortness of Breath   HPI Kristina Henson is a 68 y.o. female with history of COPD, A-fib on warfarin, HTN, HLD presenting today for shortness of breath.  Patient reports onset of cough, runny nose, and shortness of breath for the past 2 days.  Patient states she has been using her albuterol  inhalers at home without significant benefit.  Denies any chest pain, abdominal pain, nausea, vomiting, leg pain, leg swelling, fevers, chills.     Physical Exam   Triage Vital Signs: ED Triage Vitals  Encounter Vitals Group     BP 04/02/24 1410 132/66     Girls Systolic BP Percentile --      Girls Diastolic BP Percentile --      Boys Systolic BP Percentile --      Boys Diastolic BP Percentile --      Pulse Rate 04/02/24 1410 (!) 112     Resp 04/02/24 1410 19     Temp 04/02/24 1410 98.1 F (36.7 C)     Temp Source 04/02/24 1410 Oral     SpO2 04/02/24 1410 92 %     Weight 04/02/24 1407 170 lb (77.1 kg)     Height 04/02/24 1407 5' 2 (1.575 m)     Head Circumference --      Peak Flow --      Pain Score 04/02/24 1406 0     Pain Loc --      Pain Education --      Exclude from Growth Chart --     Most recent vital signs: Vitals:   04/02/24 1440 04/02/24 1738  BP: 134/67 101/83  Pulse: (!) 113 99  Resp: (!) 21 (!) 24  Temp:    SpO2: 93% 95%   Physical Exam: I have reviewed the vital signs and nursing notes. General: Awake, alert, no acute distress.  Nontoxic appearing. Head:  Atraumatic, normocephalic.   ENT:  EOM intact, PERRL. Oral mucosa is pink and moist with no lesions. Neck: Neck is supple with full range of motion, No meningeal signs. Cardiovascular:  RRR, No murmurs. Peripheral pulses palpable and equal bilaterally. Respiratory:  Symmetrical chest wall expansion.  Trace end expiratory wheeze throughout.  No  obvious crackles, rales, rhonchi Musculoskeletal:  No cyanosis or edema. Moving extremities with full ROM Abdomen:  Soft, nontender, nondistended. Neuro:  GCS 15, moving all four extremities, interacting appropriately. Speech clear. Psych:  Calm, appropriate.   Skin:  Warm, dry, no rash.    ED Results / Procedures / Treatments   Labs (all labs ordered are listed, but only abnormal results are displayed) Labs Reviewed  BASIC METABOLIC PANEL WITH GFR - Abnormal; Notable for the following components:      Result Value   Potassium 3.4 (*)    CO2 20 (*)    Glucose, Bld 105 (*)    All other components within normal limits  CBC - Abnormal; Notable for the following components:   WBC 2.9 (*)    Hemoglobin 11.6 (*)    RDW 17.3 (*)    Platelets 107 (*)    All other components within normal limits  RESP PANEL BY RT-PCR (RSV, FLU A&B, COVID)  RVPGX2  PROTIME-INR     EKG My EKG interpretation: Rate of 106, A-fib.  Left axis deviation.  No acute  ST elevations or depressions   RADIOLOGY Independently interpreted chest x-ray with no acute pathology   PROCEDURES:  Critical Care performed: No  Procedures   MEDICATIONS ORDERED IN ED: Medications  dextromethorphan  (DELSYM ) 30 MG/5ML liquid 15 mg (has no administration in time range)  levofloxacin  (LEVAQUIN ) tablet 750 mg (has no administration in time range)  ipratropium-albuterol  (DUONEB) 0.5-2.5 (3) MG/3ML nebulizer solution 3 mL (3 mLs Nebulization Given 04/02/24 1739)  ipratropium-albuterol  (DUONEB) 0.5-2.5 (3) MG/3ML nebulizer solution 3 mL (3 mLs Nebulization Given 04/02/24 1739)  sodium chloride  0.9 % bolus 1,000 mL (1,000 mLs Intravenous New Bag/Given 04/02/24 1745)  methylPREDNISolone  sodium succinate (SOLU-MEDROL ) 125 mg/2 mL injection 125 mg (125 mg Intravenous Given 04/02/24 1740)     IMPRESSION / MDM / ASSESSMENT AND PLAN / ED COURSE  I reviewed the triage vital signs and the nursing notes.                               Differential diagnosis includes, but is not limited to, COPD exacerbation, COVID, flu, RSV, other viral URI, pneumonia  Patient's presentation is most consistent with acute complicated illness / injury requiring diagnostic workup.  Patient is a 68 year old female with history of COPD presenting today for cough, congestion, and shortness of breath.  Mild tachycardia on exam although does have a history of A-fib and does not appear in RVR at this time.  Will give 1 L of fluids.  She has no significant tachypnea and certainly no hypoxia on room air.  Concern for pulmonary infection triggering COPD exacerbation given she has a mild expiratory wheeze at this time.  Will give 1 L fluids, DuoNeb treatment, and Solu-Medrol .  Patient was found to have CBC with chronic known leukopenia.  BMP largely unremarkable and negative COVID/flu/RSV.  Negative for pneumonia on chest x-ray.  Patient was reassessed following breathing treatments and feeling significantly better.  No ongoing wheezing.  Heart rate normalized following fluids with no ongoing A-fib with RVR.  She does feel significantly better and with no hypoxia or ongoing significant symptoms, do feel she is safe for outpatient management of COPD exacerbation.  Will start her on levofloxacin  for suspected trigger of pneumonia along with prednisone .  She is allergic to amoxicillin  which is why levofloxacin  was chosen.  Also given Delsym  cough.  Told to follow-up with PCP in 3 to 4 days and given strict return precautions.  The patient is on the cardiac monitor to evaluate for evidence of arrhythmia and/or significant heart rate changes.     FINAL CLINICAL IMPRESSION(S) / ED DIAGNOSES   Final diagnoses:  COPD exacerbation (HCC)     Rx / DC Orders   ED Discharge Orders          Ordered    predniSONE  (DELTASONE ) 10 MG tablet  Daily        04/02/24 1836    levofloxacin  (LEVAQUIN ) 750 MG tablet  Daily        04/02/24 1836    dextromethorphan  15  MG/5ML syrup  4 times daily PRN        04/02/24 1836             Note:  This document was prepared using Dragon voice recognition software and may include unintentional dictation errors.   Malvina Alm DASEN, MD 04/02/24 [phone]

## 2024-04-03 ENCOUNTER — Other Ambulatory Visit: Payer: Self-pay | Admitting: Family Medicine

## 2024-04-03 ENCOUNTER — Telehealth: Payer: Self-pay | Admitting: Emergency Medicine

## 2024-04-03 MED ORDER — DEXTROMETHORPHAN HBR 15 MG/5ML PO SYRP: 10.0000 mL | ORAL_SOLUTION | Freq: Four times a day (QID) | ORAL | 0 refills | Status: DC | PRN

## 2024-04-03 NOTE — Telephone Encounter (Signed)
 Copied from CRM [id number]. Topic: Clinical - Medication Refill >> Apr 03, 2024  1:31 PM Everette C wrote: Medication: amiodarone  (PACERONE ) 200 MG tablet   Has the patient contacted their pharmacy? Yes (Agent: If no, request that the patient contact the pharmacy for the refill. If patient does not wish to contact the pharmacy document the reason why and proceed with request.) (Agent: If yes, when and what did the pharmacy advise?)  This is the patient's preferred pharmacy:  [facility] [address]), [place] - [address] EUGENE OTHEL JACOBS [place]) [place] [address] Phone: [phone] Fax: [phone]  Is this the correct pharmacy for this prescription? Yes If no, delete pharmacy and type the correct one.   Has the prescription been filled recently? Yes  Is the patient out of the medication? No  Has the patient been seen for an appointment in the last year OR does the patient have an upcoming appointment? Yes  Can we respond through MyChart? No  Agent: Please be advised that Rx refills may take up to 3 business days. We ask that you follow-up with your pharmacy.

## 2024-04-03 NOTE — Telephone Encounter (Signed)
 Patient called back to the ED today stating that she had not received the Delsym  cough medicine to her pharmacy at [facility].  I have resent in that prescription here today.

## 2024-04-04 ENCOUNTER — Inpatient Hospital Stay: Admitting: Nurse Practitioner

## 2024-04-05 NOTE — Telephone Encounter (Signed)
 Requested medications are due for refill today.  unsure  Requested medications are on the active medications list.  yes  Last refill. 04/03/2018 #30 0 rf  Future visit scheduled.   yes  Notes to clinic.  Last refilled 2019  - Refill not delegated.    Requested Prescriptions  Pending Prescriptions Disp Refills   amiodarone  (PACERONE ) 200 MG tablet 30 tablet 0    Sig: Take 1 tablet (200 mg total) by mouth every morning.     Not Delegated - Cardiovascular: Antiarrhythmic Agents - amiodarone  Failed - 04/05/2024  2:19 PM      Failed - This refill cannot be delegated      Failed - Manual Review: Eye exam recommended every 12 months      Failed - Mg Level in normal range and within 360 days    Magnesium  Date Value Ref Range Status  08/08/2020 2.1 1.7 - 2.4 mg/dL Final    Comment:    Performed at [facility], [address] [place], [place] [address]         Failed - K in normal range and within 180 days    Potassium  Date Value Ref Range Status  04/02/2024 3.4 (L) 3.5 - 5.1 mmol/L Final         Failed - AST in normal range and within 180 days    AST  Date Value Ref Range Status  02/07/2024 64 (H) 0 - 40 IU/L Final         Failed - ALT in normal range and within 180 days    ALT  Date Value Ref Range Status  02/07/2024 63 (H) 0 - 32 IU/L Final         Failed - Patient had ECG in the last 180 days      Passed - TSH in normal range and within 360 days    TSH  Date Value Ref Range Status  08/10/2023 1.520 0.450 - 4.500 uIU/mL Final         Passed - Patient is not pregnant      Passed - Last BP in normal range    BP Readings from Last 1 Encounters:  04/02/24 101/83         Passed - Last Heart Rate in normal range    Pulse Readings from Last 1 Encounters:  04/02/24 99         Passed - Valid encounter within last 6 months    Recent Outpatient Visits           1 month ago Benign hypertensive renal disease   [facility]  [place], Megan P, DO   4 months ago Hordeolum externum of left lower eyelid   [facility] Herold Hadassah SQUIBB, MD       Future Appointments             In 1 month McGowan, Clotilda DELENA RIGGERS [facility] [place]   In 4 months Vicci Duwaine SQUIBB, DO [facility], [place] [facility]            Passed - Patient had chest x-ray within the last 6 months

## 2024-04-10 ENCOUNTER — Inpatient Hospital Stay: Admitting: Nurse Practitioner

## 2024-04-10 NOTE — Progress Notes (Deleted)
   There were no vitals taken for this visit.   Subjective:    Patient ID: Kristina Henson, female    DOB: 1956/02/02, 68 y.o.   MRN: [id number]  HPI: Kristina Henson is a 68 y.o. female  No chief complaint on file.  Patient recently seen in the ER due to COPD exacerbation.  Patient was treated with steroids and antibiotics.  Patient presents to clinic today with complaints of     Relevant past medical, surgical, family and social history reviewed and updated as indicated. Interim medical history since our last visit reviewed. Allergies and medications reviewed and updated.  Review of Systems  Per HPI unless specifically indicated above     Objective:    There were no vitals taken for this visit.  Wt Readings from Last 3 Encounters:  04/02/24 170 lb (77.1 kg)  03/28/24 170 lb (77.1 kg)  02/07/24 168 lb (76.2 kg)    Physical Exam  Results for orders placed or performed during the hospital encounter of 04/02/24  Basic metabolic panel   Collection Time: 04/02/24  2:08 PM  Result Value Ref Range   Sodium 137 135 - 145 mmol/L   Potassium 3.4 (L) 3.5 - 5.1 mmol/L   Chloride 105 98 - 111 mmol/L   CO2 20 (L) 22 - 32 mmol/L   Glucose, Bld 105 (H) 70 - 99 mg/dL   BUN 20 8 - 23 mg/dL   Creatinine, Ser 9.38 0.44 - 1.00 mg/dL   Calcium  9.1 8.9 - 10.3 mg/dL   GFR, Estimated >39 >39 mL/min   Anion gap 12 5 - 15  CBC   Collection Time: 04/02/24  2:08 PM  Result Value Ref Range   WBC 2.9 (L) 4.0 - 10.5 K/uL   RBC 4.35 3.87 - 5.11 MIL/uL   Hemoglobin 11.6 (L) 12.0 - 15.0 g/dL   HCT 63.1 63.9 - 53.9 %   MCV 84.6 80.0 - 100.0 fL   MCH 26.7 26.0 - 34.0 pg   MCHC 31.5 30.0 - 36.0 g/dL   RDW 82.6 (H) 88.4 - 84.4 %   Platelets 107 (L) 150 - 400 K/uL   nRBC 0.0 0.0 - 0.2 %  Resp panel by RT-PCR (RSV, Flu A&B, Covid) Anterior Nasal Swab   Collection Time: 04/02/24  2:10 PM   Specimen: Anterior Nasal Swab  Result Value Ref Range   SARS Coronavirus 2 by RT PCR NEGATIVE NEGATIVE    Influenza A by PCR NEGATIVE NEGATIVE   Influenza B by PCR NEGATIVE NEGATIVE   Resp Syncytial Virus by PCR NEGATIVE NEGATIVE  Protime-INR   Collection Time: 04/02/24  5:28 PM  Result Value Ref Range   Prothrombin Time 24.4 (H) 11.4 - 15.2 seconds   INR 2.1 (H) 0.8 - 1.2      Assessment & Plan:   Problem List Items Addressed This Visit   None    Follow up plan: No follow-ups on file.

## 2024-04-24 ENCOUNTER — Ambulatory Visit (INDEPENDENT_AMBULATORY_CARE_PROVIDER_SITE_OTHER): Admitting: Family Medicine

## 2024-04-24 ENCOUNTER — Encounter: Payer: Self-pay | Admitting: Family Medicine

## 2024-04-24 VITALS — BP 134/84 | HR 86 | Temp 97.9°F | Ht 62.0 in | Wt 164.2 lb

## 2024-04-24 DIAGNOSIS — D500 Iron deficiency anemia secondary to blood loss (chronic): Secondary | ICD-10-CM

## 2024-04-24 DIAGNOSIS — J42 Unspecified chronic bronchitis: Secondary | ICD-10-CM

## 2024-04-24 DIAGNOSIS — E876 Hypokalemia: Secondary | ICD-10-CM | POA: Diagnosis not present

## 2024-04-24 NOTE — Assessment & Plan Note (Signed)
 Lungs clear. Feeling well. Continue to monitor. Call with any concerns.

## 2024-04-24 NOTE — Assessment & Plan Note (Signed)
 Rechecking labs today. Await results. Treat as needed.

## 2024-04-24 NOTE — Progress Notes (Signed)
 BP 134/84   Pulse 86   Temp 97.9 F (36.6 C) (Oral)   Ht 5' 2 (1.575 m)   Wt 164 lb 3.2 oz (74.5 kg)   SpO2 97%   BMI 30.03 kg/m    Subjective:    Patient ID: Kristina Henson, female    DOB: Apr 27, 1956, 68 y.o.   MRN: [id number]  HPI: Kristina Henson is a 68 y.o. female  Chief Complaint  Patient presents with   Follow-up    ED   COPD        ER FOLLOW UP Time since discharge: 3 weeks Hospital/facility: [facility] Diagnosis: COPD exacerbation Procedures/tests:  CLINICAL DATA:  Shortness of breath  EXAM: CHEST - 2 VIEW  COMPARISON:  August 28, 2020  FINDINGS: Mildly low lung volumes. Increased interstitial opacities with perihilar fullness bilaterally. No pleural effusions. No pneumothorax. Unchanged cardiomegaly. No acute osseous findings. IMPRESSION: Likely interstitial pulmonary edema.  Unchanged cardiomegaly.  Consultants: None New medications: Levaquin , prednisone , dextromethorphan  Discharge instructions:  Follow up here Status: better  Since getting home from the hospital, Kristina Henson has been feeling better. She denies any SOB or cough or fever. She is otherwise doing well with no other concerns or complaints at that time.   Relevant past medical, surgical, family and social history reviewed and updated as indicated. Interim medical history since our last visit reviewed. Allergies and medications reviewed and updated.  Review of Systems  Constitutional: Negative.   Respiratory: Negative.    Cardiovascular: Negative.   Musculoskeletal: Negative.   Neurological: Negative.   Psychiatric/Behavioral: Negative.      Per HPI unless specifically indicated above     Objective:    BP 134/84   Pulse 86   Temp 97.9 F (36.6 C) (Oral)   Ht 5' 2 (1.575 m)   Wt 164 lb 3.2 oz (74.5 kg)   SpO2 97%   BMI 30.03 kg/m   Wt Readings from Last 3 Encounters:  04/24/24 164 lb 3.2 oz (74.5 kg)  04/02/24 170 lb (77.1 kg)  03/28/24 170 lb (77.1 kg)    Physical  Exam Vitals and nursing note reviewed.  Constitutional:      General: She is not in acute distress.    Appearance: Normal appearance. She is not ill-appearing, toxic-appearing or diaphoretic.  HENT:     Head: Normocephalic and atraumatic.     Right Ear: External ear normal.     Left Ear: External ear normal.     Nose: Nose normal.     Mouth/Throat:     Mouth: Mucous membranes are moist.     Pharynx: Oropharynx is clear.  Eyes:     General: No scleral icterus.       Right eye: No discharge.        Left eye: No discharge.     Extraocular Movements: Extraocular movements intact.     Conjunctiva/sclera: Conjunctivae normal.     Pupils: Pupils are equal, round, and reactive to light.  Cardiovascular:     Rate and Rhythm: Normal rate and regular rhythm.     Pulses: Normal pulses.     Heart sounds: Normal heart sounds. No murmur heard.    No friction rub. No gallop.  Pulmonary:     Effort: Pulmonary effort is normal. No respiratory distress.     Breath sounds: Normal breath sounds. No stridor. No wheezing, rhonchi or rales.  Chest:     Chest wall: No tenderness.  Musculoskeletal:  General: Normal range of motion.     Cervical back: Normal range of motion and neck supple.  Skin:    General: Skin is warm and dry.     Capillary Refill: Capillary refill takes less than 2 seconds.     Coloration: Skin is not jaundiced or pale.     Findings: No bruising, erythema, lesion or rash.  Neurological:     General: No focal deficit present.     Mental Status: She is alert and oriented to person, place, and time. Mental status is at baseline.  Psychiatric:        Mood and Affect: Mood normal.        Behavior: Behavior normal.        Thought Content: Thought content normal.        Judgment: Judgment normal.     Results for orders placed or performed during the hospital encounter of 04/02/24  Basic metabolic panel   Collection Time: 04/02/24  2:08 PM  Result Value Ref Range    Sodium 137 135 - 145 mmol/L   Potassium 3.4 (L) 3.5 - 5.1 mmol/L   Chloride 105 98 - 111 mmol/L   CO2 20 (L) 22 - 32 mmol/L   Glucose, Bld 105 (H) 70 - 99 mg/dL   BUN 20 8 - 23 mg/dL   Creatinine, Ser 9.38 0.44 - 1.00 mg/dL   Calcium  9.1 8.9 - 10.3 mg/dL   GFR, Estimated >39 >39 mL/min   Anion gap 12 5 - 15  CBC   Collection Time: 04/02/24  2:08 PM  Result Value Ref Range   WBC 2.9 (L) 4.0 - 10.5 K/uL   RBC 4.35 3.87 - 5.11 MIL/uL   Hemoglobin 11.6 (L) 12.0 - 15.0 g/dL   HCT 63.1 63.9 - 53.9 %   MCV 84.6 80.0 - 100.0 fL   MCH 26.7 26.0 - 34.0 pg   MCHC 31.5 30.0 - 36.0 g/dL   RDW 82.6 (H) 88.4 - 84.4 %   Platelets 107 (L) 150 - 400 K/uL   nRBC 0.0 0.0 - 0.2 %  Resp panel by RT-PCR (RSV, Flu A&B, Covid) Anterior Nasal Swab   Collection Time: 04/02/24  2:10 PM   Specimen: Anterior Nasal Swab  Result Value Ref Range   SARS Coronavirus 2 by RT PCR NEGATIVE NEGATIVE   Influenza A by PCR NEGATIVE NEGATIVE   Influenza B by PCR NEGATIVE NEGATIVE   Resp Syncytial Virus by PCR NEGATIVE NEGATIVE  Protime-INR   Collection Time: 04/02/24  5:28 PM  Result Value Ref Range   Prothrombin Time 24.4 (H) 11.4 - 15.2 seconds   INR 2.1 (H) 0.8 - 1.2      Assessment & Plan:   Problem List Items Addressed This Visit       Respiratory   COPD (chronic obstructive pulmonary disease) (HCC) - Primary   Lungs clear. Feeling well. Continue to monitor. Call with any concerns.         Other   Iron  deficiency anemia due to chronic blood loss   Rechecking labs today. Await results. Treat as needed.       Relevant Orders   CBC with Differential/Platelet   Other Visit Diagnoses       Hypokalemia       Rechecking labs today. Await results. Treat as needed.   Relevant Orders   Basic metabolic panel with GFR        Follow up plan: Return for As scheduled.

## 2024-04-25 ENCOUNTER — Ambulatory Visit: Payer: Self-pay | Admitting: Family Medicine

## 2024-04-25 LAB — CBC WITH DIFFERENTIAL/PLATELET
Basophils Absolute: 0 x10E3/uL (ref 0.0–0.2)
Basos: 0 %
EOS (ABSOLUTE): 0 x10E3/uL (ref 0.0–0.4)
Eos: 0 %
Hematocrit: 38.5 % (ref 34.0–46.6)
Hemoglobin: 11.9 g/dL (ref 11.1–15.9)
Immature Grans (Abs): 0 x10E3/uL (ref 0.0–0.1)
Immature Granulocytes: 0 %
Lymphocytes Absolute: 0.8 x10E3/uL (ref 0.7–3.1)
Lymphs: 16 %
MCH: 26.4 pg — ABNORMAL LOW (ref 26.6–33.0)
MCHC: 30.9 g/dL — ABNORMAL LOW (ref 31.5–35.7)
MCV: 86 fL (ref 79–97)
Monocytes Absolute: 0.5 x10E3/uL (ref 0.1–0.9)
Monocytes: 11 %
Neutrophils Absolute: 3.4 x10E3/uL (ref 1.4–7.0)
Neutrophils: 73 %
Platelets: 101 x10E3/uL — ABNORMAL LOW (ref 150–450)
RBC: 4.5 x10E6/uL (ref 3.77–5.28)
RDW: 16.8 % — ABNORMAL HIGH (ref 11.7–15.4)
WBC: 4.7 x10E3/uL (ref 3.4–10.8)

## 2024-04-25 LAB — BASIC METABOLIC PANEL WITH GFR
BUN/Creatinine Ratio: 29 — ABNORMAL HIGH (ref 12–28)
BUN: 26 mg/dL (ref 8–27)
CO2: 19 mmol/L — ABNORMAL LOW (ref 20–29)
Calcium: 9.4 mg/dL (ref 8.7–10.3)
Chloride: 105 mmol/L (ref 96–106)
Creatinine, Ser: 0.91 mg/dL (ref 0.57–1.00)
Glucose: 92 mg/dL (ref 70–99)
Potassium: 4 mmol/L (ref 3.5–5.2)
Sodium: 142 mmol/L (ref 134–144)
eGFR: 70 mL/min/1.73 (ref >59)

## 2024-04-25 NOTE — Progress Notes (Signed)
 Letter printed and mailed.

## 2024-04-26 ENCOUNTER — Inpatient Hospital Stay: Payer: Medicare HMO | Attending: Internal Medicine

## 2024-04-26 ENCOUNTER — Inpatient Hospital Stay (HOSPITAL_BASED_OUTPATIENT_CLINIC_OR_DEPARTMENT_OTHER): Payer: Medicare HMO | Admitting: Internal Medicine

## 2024-04-26 ENCOUNTER — Encounter: Payer: Self-pay | Admitting: Internal Medicine

## 2024-04-26 ENCOUNTER — Inpatient Hospital Stay: Payer: Medicare HMO

## 2024-04-26 VITALS — BP 138/85 | HR 82 | Temp 97.0°F | Resp 20 | Ht 62.0 in | Wt 165.4 lb

## 2024-04-26 DIAGNOSIS — D61818 Other pancytopenia: Secondary | ICD-10-CM | POA: Diagnosis not present

## 2024-04-26 DIAGNOSIS — D638 Anemia in other chronic diseases classified elsewhere: Secondary | ICD-10-CM | POA: Diagnosis not present

## 2024-04-26 DIAGNOSIS — D649 Anemia, unspecified: Secondary | ICD-10-CM | POA: Diagnosis not present

## 2024-04-26 LAB — BASIC METABOLIC PANEL WITH GFR
Anion gap: 8 (ref 5–15)
BUN: 23 mg/dL (ref 8–23)
CO2: 24 mmol/L (ref 22–32)
Calcium: 9.2 mg/dL (ref 8.9–10.3)
Chloride: 104 mmol/L (ref 98–111)
Creatinine, Ser: 0.84 mg/dL (ref 0.44–1.00)
GFR, Estimated: >60 mL/min (ref >60)
Glucose, Bld: 108 mg/dL — ABNORMAL HIGH (ref 70–99)
Potassium: 3.7 mmol/L (ref 3.5–5.1)
Sodium: 136 mmol/L (ref 135–145)

## 2024-04-26 LAB — CBC WITH DIFFERENTIAL (CANCER CENTER ONLY)
Abs Immature Granulocytes: 0.02 K/uL (ref 0.00–0.07)
Basophils Absolute: 0 K/uL (ref 0.0–0.1)
Basophils Relative: 1 %
Eosinophils Absolute: 0 K/uL (ref 0.0–0.5)
Eosinophils Relative: 1 %
HCT: 36.8 % (ref 36.0–46.0)
Hemoglobin: 11.5 g/dL — ABNORMAL LOW (ref 12.0–15.0)
Immature Granulocytes: 1 %
Lymphocytes Relative: 18 %
Lymphs Abs: 0.7 K/uL (ref 0.7–4.0)
MCH: 26.3 pg (ref 26.0–34.0)
MCHC: 31.3 g/dL (ref 30.0–36.0)
MCV: 84.2 fL (ref 80.0–100.0)
Monocytes Absolute: 0.4 K/uL (ref 0.1–1.0)
Monocytes Relative: 12 %
Neutro Abs: 2.6 K/uL (ref 1.7–7.7)
Neutrophils Relative %: 67 %
Platelet Count: 126 K/uL — ABNORMAL LOW (ref 150–400)
RBC: 4.37 MIL/uL (ref 3.87–5.11)
RDW: 17.2 % — ABNORMAL HIGH (ref 11.5–15.5)
WBC Count: 3.7 K/uL — ABNORMAL LOW (ref 4.0–10.5)
nRBC: 0 % (ref 0.0–0.2)

## 2024-04-26 LAB — FERRITIN: Ferritin: 140 ng/mL (ref 11–307)

## 2024-04-26 LAB — IRON AND TIBC
Iron: 29 ug/dL (ref 28–170)
Saturation Ratios: 9 % — ABNORMAL LOW (ref 10.4–31.8)
TIBC: 333 ug/dL (ref 250–450)
UIBC: 304 ug/dL

## 2024-04-26 NOTE — Progress Notes (Signed)
 [facility] CONSULT NOTE  Patient Care Team: Vicci Duwaine SQUIBB, DO as PCP - General (Family Medicine) Rennie Cindy SAUNDERS, MD as Consulting Physician (Oncology) Dewane Shiner, DO as Referring Physician (Cardiology) Helon Clotilda DELENA DEVONNA as Physician Assistant (Urology)  CHIEF COMPLAINTS/PURPOSE OF CONSULTATION:   # Anemia of chronic disease/mild intermittent leucopenia/thromboctyopenia? Autoimmune- EGD/ [place] [Dr.Wohl; 2017] chronic back pain [? 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   HISTORY OF PRESENTING ILLNESS: Patient is alone.  Ambulating independently.  Kristina Henson Bihari 68 y.o.  female mild to moderate anemia/intermittent thrombocytopenia/leukopenia of Unclear etiology- is here for follow-up.  Patient was recently seen in the emergency room for upper respiratory infection.  Currently resolved.   No blood in stools or black or stools.  She continues to be on p.o. iron  once a day.  Denies any worsening fatigue or lightheadedness or falls.  Review of Systems  Constitutional:  Positive for malaise/fatigue. Negative for chills, diaphoresis, fever and weight loss.  HENT:  Negative for nosebleeds and sore throat.   Eyes:  Negative for double vision.  Respiratory:  Negative for cough, hemoptysis, sputum production, shortness of breath and wheezing.   Cardiovascular:  Negative for chest pain, palpitations, orthopnea and leg swelling.  Gastrointestinal:  Negative for abdominal pain, blood in stool, constipation, diarrhea, heartburn, melena, nausea and vomiting.  Genitourinary:  Negative for dysuria, frequency and urgency.  Musculoskeletal:  Positive for joint pain. Negative for back pain.  Skin: Negative.  Negative for itching and  rash.  Neurological:  Negative for dizziness, tingling, focal weakness, weakness and headaches.  Endo/Heme/Allergies:  Does not bruise/bleed easily.  Psychiatric/Behavioral:  Negative for depression. The patient is not nervous/anxious and does not have insomnia.      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   Atrial fibrillation (HCC)    a.) CHA2DS2-VASc = 5 (sex, HTN, CVA x 2, aortic plaque). b.) s/p ablation in 2008. c.) rate/rhythm maintained on oral amiodarone ; not currently on daily anticoagulation.   Coronary artery disease    Duodenitis    Gastritis    HLD (hyperlipidemia)    Hypertension    IDA (iron  deficiency anemia)    LBBB (left bundle branch block)    Mass of left submandibular region 04/29/2016   a.) FNA 04/29/2016 --> Bx (+) for pleomorphic adenoma; s/p resection   PAH (pulmonary artery hypertension) (HCC) 07/24/2020   a.) CT 07/24/2020 showed enlarged pulmonary artery consistent with PAH.   Pneumonia 2016   Sleep apnea    Stroke [facility]) 2012    SURGICAL HISTORY: Past Surgical History:  Procedure Laterality Date   BIOPSY SALIVARY GLAND Left 04/29/2016   Procedure: SUBMANDIBULAR GLAND BIOPSY (FNA); Location: [facility]; Surgeon: Juliene Balder, MD (IR)   cardiac catherization     CARDIAC ELECTROPHYSIOLOGY STUDY AND ABLATION N/A 2008   Procedure: ATRIAL FIBRILLATION ABLATION; Location: [facility]    COLONOSCOPY WITH PROPOFOL  N/A 11/18/2015   Procedure: COLONOSCOPY WITH PROPOFOL ;  Surgeon: Rogelia Copping, MD;  Location: [facility] ENDOSCOPY;  Service: Endoscopy;  Laterality: N/A;   COLONOSCOPY WITH PROPOFOL  N/A 12/02/2020  Procedure: COLONOSCOPY WITH PROPOFOL ;  Surgeon: Jinny Carmine, MD;  Location: [facility] ENDOSCOPY;  Service: Endoscopy;  Laterality: N/A;   CORONARY ANGIOPLASTY     CYSTOSCOPY WITH  BIOPSY N/A 11/30/2021   Procedure: CYSTOSCOPY WITH BLADDER BIOPSY;  Surgeon: Penne Knee, MD;  Location: [facility] ORS;  Service: Urology;  Laterality: N/A;   ESOPHAGOGASTRODUODENOSCOPY (EGD) WITH PROPOFOL  N/A 04/23/2016   Procedure: ESOPHAGOGASTRODUODENOSCOPY (EGD) WITH PROPOFOL ;  Surgeon: Carmine Jinny, MD;  Location: [facility];  Service: Endoscopy;  Laterality: N/A;  small bowel bx gastric antrum bx   SUBMANDIBULAR GLAND EXCISION Left 09/08/2017   Procedure: EXCISION SUBMANDIBULAR GLAND;  Surgeon: Milissa Hamming, MD;  Location: [facility] ORS;  Service: ENT;  Laterality: Left;   TOTAL ABDOMINAL HYSTERECTOMY W/ BILATERAL SALPINGOOPHORECTOMY     Total    SOCIAL HISTORY: Social History   Socioeconomic History   Marital status: Divorced    Spouse name: Not on file   Number of children: 1   Years of education: 12   Highest education level: 12th grade  Occupational History   Occupation: disability   Tobacco Use   Smoking status: Former    Current packs/day: 0.00    Average packs/day: 0.1 packs/day for 3.0 years (0.3 ttl pk-yrs)    Types: Cigarettes    Start date: 65    Quit date: 1978    Years since quitting: 47.6   Smokeless tobacco: Never   Tobacco comments:    didnt smoke much   Vaping Use   Vaping status: Never Used  Substance and Sexual Activity   Alcohol use: Yes    Alcohol/week: 1.0 standard drink of alcohol    Types: 1 Glasses of wine per week    Comment: occasionally   Drug use: No   Sexual activity: Not Currently  Other Topics Concern   Not on file  Social History Narrative   Not on file   Social Drivers of Health   Financial Resource Strain: Low Risk  (11/22/2023)   Overall Financial Resource Strain (CARDIA)    Difficulty of Paying Living Expenses: Not hard at all  Food Insecurity: No Food Insecurity (11/22/2023)   Hunger Vital Sign    Worried About Running Out of Food in the Last Year: Never true    Ran Out of Food in the Last Year: Never true   Transportation Needs: No Transportation Needs (11/22/2023)   PRAPARE - Administrator, Civil Service (Medical): No    Lack of Transportation (Non-Medical): No  Physical Activity: Inactive (11/22/2023)   Exercise Vital Sign    Days of Exercise per Week: 0 days    Minutes of Exercise per Session: 0 min  Stress: No Stress Concern Present (11/22/2023)   [employer] of Occupational Health - Occupational Stress Questionnaire    Feeling of Stress : Not at all  Social Connections: Socially Isolated (11/22/2023)   Social Connection and Isolation Panel    Frequency of Communication with Friends and Family: More than three times a week    Frequency of Social Gatherings with Friends and Family: More than three times a week    Attends Religious Services: Never    Database administrator or Organizations: No    Attends Banker Meetings: Never    Marital Status: Divorced  Catering manager Violence: Not At Risk (11/22/2023)   Humiliation, Afraid, Rape, and Kick questionnaire    Fear of Current or Ex-Partner: No    Emotionally Abused: No    Physically Abused: No  Sexually Abused: No    FAMILY HISTORY: Family History  Problem Relation Age of Onset   Arthritis Mother    Cancer Mother    Other Father        unknown medical history   Hypertension Sister    Heart attack Sister    Cancer Sister        breast cancer   Asthma Brother    Dementia Brother    Breast cancer Neg Hx    Kidney cancer Neg Hx    Bladder Cancer Neg Hx     ALLERGIES:  is allergic to amoxicillin .  MEDICATIONS:  Current Outpatient Medications  Medication Sig Dispense Refill   albuterol  (VENTOLIN  HFA) 108 (90 Base) MCG/ACT inhaler Inhale 2 puffs into the lungs every 6 (six) hours as needed for wheezing or shortness of breath. 8 g 3   amiodarone  (PACERONE ) 200 MG tablet Take 1 tablet (200 mg total) by mouth every morning. 30 tablet 0   amLODipine  (NORVASC ) 2.5 MG tablet Take 1 tablet (2.5 mg  total) by mouth daily. 90 tablet 1   baclofen  (LIORESAL ) 10 MG tablet Take 0.5-1 tablets (5-10 mg total) by mouth at bedtime as needed. 30 each 0   Budeson-Glycopyrrol-Formoterol (BREZTRI  AEROSPHERE) 160-9-4.8 MCG/ACT AERO Inhale 2 puffs into the lungs 2 (two) times daily. 10.7 g 11   conjugated estrogens  (PREMARIN ) vaginal cream Apply one pea-sized amount around the opening of the urethra daily for 2 weeks, then 3 times weekly moving forward. 30 g 4   FEROSUL 325 (65 Fe) MG tablet Take 1 tablet (325 mg total) by mouth 3 (three) times daily with meals. 270 tablet 1   hydrALAZINE  (APRESOLINE ) 50 MG tablet Take 1 tablet (50 mg total) by mouth 2 (two) times daily. 180 tablet 1   lisinopril -hydrochlorothiazide  (ZESTORETIC ) 20-25 MG tablet Take 1 tablet by mouth 2 (two) times daily. 180 tablet 1   metoprolol  tartrate (LOPRESSOR ) 25 MG tablet Take 0.5 tablets (12.5 mg total) by mouth 2 (two) times daily. 180 tablet 1   rosuvastatin  (CRESTOR ) 5 MG tablet Take 1 tablet (5 mg total) by mouth daily. 90 tablet 1   traZODone  (DESYREL ) 50 MG tablet Take 1-2 tablets (50-100 mg total) by mouth at bedtime as needed for sleep. 180 tablet 1   warfarin (COUMADIN) 2 MG tablet Take by mouth.     No current facility-administered medications for this visit.      SABRA  PHYSICAL EXAMINATION: ECOG PERFORMANCE STATUS: 1 - Symptomatic but completely ambulatory  Vitals:   04/26/24 1031  BP: 138/85  Pulse: 82  Resp: 20  Temp: (!) 97 F (36.1 C)  SpO2: 99%    Filed Weights   04/26/24 1031  Weight: 165 lb 6.4 oz (75 kg)     Physical Exam HENT:     Head: Normocephalic and atraumatic.     Mouth/Throat:     Pharynx: No oropharyngeal exudate.  Eyes:     Pupils: Pupils are equal, round, and reactive to light.  Cardiovascular:     Rate and Rhythm: Normal rate and regular rhythm.  Pulmonary:     Effort: No respiratory distress.     Breath sounds: No wheezing.  Abdominal:     General: Bowel sounds are normal.  There is no distension.     Palpations: Abdomen is soft. There is no mass.     Tenderness: There is no abdominal tenderness. There is no guarding or rebound.  Musculoskeletal:  General: No tenderness. Normal range of motion.     Cervical back: Normal range of motion and neck supple.  Skin:    General: Skin is warm.  Neurological:     Mental Status: She is alert and oriented to person, place, and time.  Psychiatric:        Mood and Affect: Affect normal.      LABORATORY DATA:  I have reviewed the data as listed Lab Results  Component Value Date   WBC 3.7 (L) 04/26/2024   HGB 11.5 (L) 04/26/2024   HCT 36.8 04/26/2024   MCV 84.2 04/26/2024   PLT 126 (L) 04/26/2024   Recent Labs    08/10/23 1129 10/17/23 1515 10/28/23 1406 02/07/24 1123 04/02/24 1408 04/24/24 1413 04/26/24 1008  NA 141   < > 135 142 137 142 136  K 3.8   < > 3.5 4.1 3.4* 4.0 3.7  CL 101   < > 102 103 105 105 104  CO2 22   < > 24 22 20* 19* 24  GLUCOSE 89   < > 95 98 105* 92 108*  BUN 17   < > 27* 24 20 26 23   CREATININE 0.94   < > 0.89 0.84 0.61 0.91 0.84  CALCIUM  9.3   < > 9.2 9.4 9.1 9.4 9.2  GFRNONAA  --   --  >60  --  >60  --  >60  PROT 7.1  --   --  7.2  --   --   --   ALBUMIN 4.1  --   --  4.0  --   --   --   AST 47*  --   --  64*  --   --   --   ALT 48*  --   --  63*  --   --   --   ALKPHOS 85  --   --  80  --   --   --   BILITOT 0.6  --   --  0.6  --   --   --    < > = values in this interval not displayed.    RADIOGRAPHIC STUDIES: I have personally reviewed the radiological images as listed and agreed with the findings in the report. DG Chest 2 View Result Date: 04/02/2024 CLINICAL DATA:  Shortness of breath EXAM: CHEST - 2 VIEW COMPARISON:  August 28, 2020 FINDINGS: Mildly low lung volumes. Increased interstitial opacities with perihilar fullness bilaterally. No pleural effusions. No pneumothorax. Unchanged cardiomegaly. No acute osseous findings. IMPRESSION: Likely interstitial  pulmonary edema.  Unchanged cardiomegaly. Electronically Signed   By: Michaeline Blanch M.D.   On: 04/02/2024 15:17   DG Foot Complete Right Result Date: 03/28/2024 CLINICAL DATA:  fall 1 week ago, right knee and foot pain with swelling and bruising EXAM: RIGHT ANKLE - COMPLETE 3+ VIEW; RIGHT KNEE - COMPLETE 4+ VIEW; RIGHT FOOT COMPLETE - 3+ VIEW COMPARISON:  None Available. FINDINGS: Right knee Osteopenia.No acute fracture or dislocation. No joint effusion. There is no evidence of arthropathy or other focal bone abnormality. Soft tissues are unremarkable. Right ankle Osteopenia. No acute fracture or dislocation. No ankle mortise widening. The talar dome is intact. There is no evidence of arthropathy or other focal bone abnormality. Moderate soft tissue swelling about the ankle. Right foot Osteopenia. No acute fracture or dislocation. Undersurface calcaneal heel spur. Soft tissue swelling about the dorsum of the foot. No radiopaque foreign body. IMPRESSION: Moderate soft tissue swelling about  the ankle and dorsum of the foot. Otherwise, no acute fracture or dislocation within the right knee, right ankle, and right foot. Electronically Signed   By: Rogelia Myers M.D.   On: 03/28/2024 16:24   DG Ankle Complete Right Result Date: 03/28/2024 CLINICAL DATA:  fall 1 week ago, right knee and foot pain with swelling and bruising EXAM: RIGHT ANKLE - COMPLETE 3+ VIEW; RIGHT KNEE - COMPLETE 4+ VIEW; RIGHT FOOT COMPLETE - 3+ VIEW COMPARISON:  None Available. FINDINGS: Right knee Osteopenia.No acute fracture or dislocation. No joint effusion. There is no evidence of arthropathy or other focal bone abnormality. Soft tissues are unremarkable. Right ankle Osteopenia. No acute fracture or dislocation. No ankle mortise widening. The talar dome is intact. There is no evidence of arthropathy or other focal bone abnormality. Moderate soft tissue swelling about the ankle. Right foot Osteopenia. No acute fracture or dislocation.  Undersurface calcaneal heel spur. Soft tissue swelling about the dorsum of the foot. No radiopaque foreign body. IMPRESSION: Moderate soft tissue swelling about the ankle and dorsum of the foot. Otherwise, no acute fracture or dislocation within the right knee, right ankle, and right foot. Electronically Signed   By: Rogelia Myers M.D.   On: 03/28/2024 16:24   DG Knee Complete 4 Views Right Result Date: 03/28/2024 CLINICAL DATA:  fall 1 week ago, right knee and foot pain with swelling and bruising EXAM: RIGHT ANKLE - COMPLETE 3+ VIEW; RIGHT KNEE - COMPLETE 4+ VIEW; RIGHT FOOT COMPLETE - 3+ VIEW COMPARISON:  None Available. FINDINGS: Right knee Osteopenia.No acute fracture or dislocation. No joint effusion. There is no evidence of arthropathy or other focal bone abnormality. Soft tissues are unremarkable. Right ankle Osteopenia. No acute fracture or dislocation. No ankle mortise widening. The talar dome is intact. There is no evidence of arthropathy or other focal bone abnormality. Moderate soft tissue swelling about the ankle. Right foot Osteopenia. No acute fracture or dislocation. Undersurface calcaneal heel spur. Soft tissue swelling about the dorsum of the foot. No radiopaque foreign body. IMPRESSION: Moderate soft tissue swelling about the ankle and dorsum of the foot. Otherwise, no acute fracture or dislocation within the right knee, right ankle, and right foot. Electronically Signed   By: Rogelia Myers M.D.   On: 03/28/2024 16:24     ASSESSMENT & PLAN:   Anemia due to chronic illness     Other pancytopenia (HCC) # Idiopathic pancytopenias of undetermined significance [bone marrow biopsy-2021 mild dyspoiesis; SNP-no multiple abnormalities. JAN 2025-]Intelli-Myeloid panel-  No clinically significant variants were detected.  Question autoimmune.  #  Anemia- likely IDA vs anemia of chronic disease  [unclear etiology]- Hxs/p IV iron ; improved. Hb ~12.02 Oct 2023- [ferritin309; iron  sat-10 %- SEP  2024]; continue PO iron  once a day.  Hold any IV iron . Over all stable.   #Mild asymptomatic leukopenia/intermittent neutropenia-Lymphopenia/ Mild intermittent thrombocytopenia-today > 100 stable.   # DISPOSITION:  # no infusions today # follow up in 12  months-MD/cbc/bmp/iron  studies/ferritin; possible Venofer -- Dr.B  All questions were answered. The patient knows to call the clinic with any problems, questions or concerns.     Cindy JONELLE Joe, MD 04/26/2024 11:26 AM

## 2024-04-26 NOTE — Assessment & Plan Note (Addendum)
#   Idiopathic pancytopenias of undetermined significance [bone marrow biopsy-2021 mild dyspoiesis; SNP-no multiple abnormalities. JAN 2025-]Intelli-Myeloid panel-  No clinically significant variants were detected.  Question autoimmune.  #  Anemia- likely IDA vs anemia of chronic disease  [unclear etiology]- Hxs/p IV iron ; improved. Hb ~12.02 Oct 2023- [ferritin309; iron  sat-10 %- SEP 2024]; continue PO iron  once a day.  Hold any IV iron . Over all stable.   #Mild asymptomatic leukopenia/intermittent neutropenia-Lymphopenia/ Mild intermittent thrombocytopenia-today > 100 stable.   # DISPOSITION:  # no infusions today # follow up in 12  months-MD/cbc/bmp/iron  studies/ferritin; possible Venofer -- Dr.B

## 2024-04-26 NOTE — Progress Notes (Signed)
 Patient reports she has no new or acute concerns at this time.

## 2024-04-26 NOTE — Assessment & Plan Note (Addendum)
 Kristina Henson

## 2024-05-01 ENCOUNTER — Other Ambulatory Visit: Payer: Self-pay | Admitting: Family Medicine

## 2024-05-01 NOTE — Telephone Encounter (Unsigned)
 Copied from CRM #[id number]. Topic: Clinical - Medication Refill >> May 01, 2024 11:58 AM Tiffini S wrote: Medication:  lisinopril -hydrochlorothiazide  (ZESTORETIC ) 20-25 MG tablet   Has the patient contacted their pharmacy? No, patient received a text stating that she is out of refills (Agent: If no, request that the patient contact the pharmacy for the refill. If patient does not wish to contact the pharmacy document the reason why and proceed with request.) (Agent: If yes, when and what did the pharmacy advise?)  This is the patient's preferred pharmacy:  [facility] [address]), [place] - [address] [place]AC[place]) [place] [address] Phone: [phone] Fax: [phone]  Is this the correct pharmacy for this prescription? Yes If no, delete pharmacy and type the correct one.   Has the prescription been filled recently? Yes  Is the patient out of the medication? No, have a bottle left  Has the patient been seen for an appointment in the last year OR does the patient have an upcoming appointment? Yes  Can we respond through MyChart? No, please call at [phone]  Agent: Please be advised that Rx refills may take up to 3 business days. We ask that you follow-up with your pharmacy.

## 2024-05-03 NOTE — Telephone Encounter (Signed)
Need to call pharmacy  - pt should have refill available.

## 2024-05-03 NOTE — Telephone Encounter (Signed)
 Too soon for refill, LRF 02/07/24 for 90 and 1 RF.  Requested Prescriptions  Pending Prescriptions Disp Refills   lisinopril -hydrochlorothiazide  (ZESTORETIC ) 20-25 MG tablet 180 tablet 1    Sig: Take 1 tablet by mouth 2 (two) times daily.     Cardiovascular:  ACEI + Diuretic Combos Passed - 05/03/2024 12:01 PM      Passed - Na in normal range and within 180 days    Sodium  Date Value Ref Range Status  04/26/2024 136 135 - 145 mmol/L Final  04/24/2024 142 134 - 144 mmol/L Final         Passed - K in normal range and within 180 days    Potassium  Date Value Ref Range Status  04/26/2024 3.7 3.5 - 5.1 mmol/L Final         Passed - Cr in normal range and within 180 days    Creatinine  Date Value Ref Range Status  06/23/2023 0.85 0.44 - 1.00 mg/dL Final   Creatinine, Ser  Date Value Ref Range Status  04/26/2024 0.84 0.44 - 1.00 mg/dL Final         Passed - eGFR is 30 or above and within 180 days    GFR calc Af Amer  Date Value Ref Range Status  10/28/2020 76 >59 mL/min/1.73 Final    Comment:    **In accordance with recommendations from the NKF-ASN Task force,**   [facility] is in the process of updating its eGFR calculation to the   2021 CKD-EPI creatinine equation that estimates kidney function   without a race variable.    GFR, Estimated  Date Value Ref Range Status  04/26/2024 >60 >60 mL/min Final    Comment:    (NOTE) Calculated using the CKD-EPI Creatinine Equation (2021)   06/23/2023 >60 >60 mL/min Final    Comment:    (NOTE) Calculated using the CKD-EPI Creatinine Equation (2021)    eGFR  Date Value Ref Range Status  04/24/2024 70 >59 mL/min/1.73 Final         Passed - Patient is not pregnant      Passed - Last BP in normal range    BP Readings from Last 1 Encounters:  04/26/24 138/85         Passed - Valid encounter within last 6 months    Recent Outpatient Visits           1 week ago Chronic bronchitis, unspecified chronic bronchitis type [facility])    [facility] Dibble, Megan P, DO   2 months ago Benign hypertensive renal disease   [facility] [facility] [place], Megan P, DO   5 months ago Hordeolum externum of left lower eyelid   [place] [facility] Herold Hadassah SQUIBB, MD       Future Appointments             In 3 weeks McGowan, Clotilda DELENA RIGGERS [facility]   In 3 months Vicci, Duwaine SQUIBB, DO  [facility], [facility]

## 2024-05-17 DIAGNOSIS — I480 Paroxysmal atrial fibrillation: Secondary | ICD-10-CM | POA: Diagnosis not present

## 2024-05-17 DIAGNOSIS — Z7901 Long term (current) use of anticoagulants: Secondary | ICD-10-CM | POA: Diagnosis not present

## 2024-05-30 ENCOUNTER — Ambulatory Visit: Payer: Self-pay | Admitting: Urology

## 2024-05-31 DIAGNOSIS — Z7901 Long term (current) use of anticoagulants: Secondary | ICD-10-CM | POA: Diagnosis not present

## 2024-05-31 DIAGNOSIS — I480 Paroxysmal atrial fibrillation: Secondary | ICD-10-CM | POA: Diagnosis not present

## 2024-05-31 DIAGNOSIS — I10 Essential (primary) hypertension: Secondary | ICD-10-CM | POA: Diagnosis not present

## 2024-05-31 DIAGNOSIS — E782 Mixed hyperlipidemia: Secondary | ICD-10-CM | POA: Diagnosis not present

## 2024-05-31 DIAGNOSIS — I639 Cerebral infarction, unspecified: Secondary | ICD-10-CM | POA: Diagnosis not present

## 2024-06-18 NOTE — Progress Notes (Unsigned)
 06/20/2024 9:21 AM   Kristina Henson Sep 02, 1956 [id number]  Referring provider: Vicci Duwaine SQUIBB, DO [address] [place],  [place] [address]  Urological history: 1. rUTI - Vaginal estrogen cream 3 nights weekly     2. 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  - CYSTITIS CYSTICA AND CYSTITIS GLANDULARIS, IN ALL SAMPLES. - NON-KERATINIZING SQUAMOUS METAPLASIA, MULTIPLE FOCI.  - NEGATIVE FOR MALIGNANCY   Chief Complaint  Patient presents with   Recurrent UTI   HPI: Kristina Henson is a 68 y.o. female who presents today for a 12 month follow up.     Previous records reviewed.   She is feeling well today.  She denied any urinary frequency, urgency, or leakage.  She has nocturia x 2-3, but it is not bothersome to her.  She has applied a vaginal estrogen cream 3 nights weekly.  Patient denies any modifying or aggravating factors.  Patient denies any recent UTI's, gross hematuria, dysuria or suprapubic/flank pain.  Patient denies any fevers, chills, nausea or vomiting.    CATH UA 6-10 WBCs and many bacteria.  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   Atrial fibrillation (HCC)    a.) CHA2DS2-VASc = 5 (sex, HTN, CVA x 2, aortic plaque). b.) s/p ablation in 2008. c.) rate/rhythm maintained on oral  amiodarone ; not currently on daily anticoagulation.   Coronary artery disease    Duodenitis    Gastritis    HLD (hyperlipidemia)    Hypertension    IDA (iron  deficiency anemia)    LBBB (left bundle branch block)    Mass of left submandibular region 04/29/2016   a.) FNA 04/29/2016 --> Bx (+) for pleomorphic adenoma; s/p resection   PAH (pulmonary artery hypertension) (HCC) 07/24/2020   a.) CT 07/24/2020 showed enlarged pulmonary artery consistent with PAH.   Pneumonia 2016   Sleep apnea    Stroke [facility]) 2012    Surgical History: Past Surgical History:  Procedure Laterality Date   BIOPSY SALIVARY GLAND Left 04/29/2016   Procedure: SUBMANDIBULAR GLAND BIOPSY (FNA); Location: [facility]; Surgeon: Juliene Balder, MD (IR)   cardiac catherization     CARDIAC ELECTROPHYSIOLOGY STUDY AND ABLATION N/A 2008   Procedure: ATRIAL FIBRILLATION ABLATION; Location: [facility]    COLONOSCOPY WITH PROPOFOL  N/A 11/18/2015   Procedure: COLONOSCOPY WITH PROPOFOL ;  Surgeon: Rogelia Copping, MD;  Location: [facility];  Service: Endoscopy;  Laterality: N/A;   COLONOSCOPY WITH PROPOFOL  N/A 12/02/2020   Procedure: COLONOSCOPY WITH PROPOFOL ;  Surgeon: Copping Rogelia, MD;  Location: [facility] ENDOSCOPY;  Service: Endoscopy;  Laterality: N/A;   CORONARY ANGIOPLASTY     CYSTOSCOPY WITH BIOPSY N/A 11/30/2021   Procedure: CYSTOSCOPY WITH BLADDER BIOPSY;  Surgeon: Penne Knee, MD;  Location: [facility] ORS;  Service: Urology;  Laterality: N/A;  ESOPHAGOGASTRODUODENOSCOPY (EGD) WITH PROPOFOL  N/A 04/23/2016   Procedure: ESOPHAGOGASTRODUODENOSCOPY (EGD) WITH PROPOFOL ;  Surgeon: Rogelia Copping, MD;  Location: [facility];  Service: Endoscopy;  Laterality: N/A;  small bowel bx gastric antrum bx   SUBMANDIBULAR GLAND EXCISION Left 09/08/2017   Procedure: EXCISION SUBMANDIBULAR GLAND;  Surgeon: Milissa Hamming, MD;  Location: [facility] ORS;  Service: ENT;  Laterality: Left;   TOTAL ABDOMINAL HYSTERECTOMY W/  BILATERAL SALPINGOOPHORECTOMY     Total    Home Medications:  Allergies as of 06/20/2024       Reactions   Amoxicillin  Other (See Comments)   Joint pains        Medication List        Accurate as of June 20, 2024  9:21 AM. If you have any questions, ask your nurse or doctor.          albuterol  108 (90 Base) MCG/ACT inhaler Commonly known as: VENTOLIN  HFA Inhale 2 puffs into the lungs every 6 (six) hours as needed for wheezing or shortness of breath.   amiodarone  200 MG tablet Commonly known as: PACERONE  Take 1 tablet (200 mg total) by mouth every morning.   amLODipine  2.5 MG tablet Commonly known as: NORVASC  Take 1 tablet (2.5 mg total) by mouth daily.   baclofen  10 MG tablet Commonly known as: LIORESAL  Take 5-10 mg by mouth. What changed: Another medication with the same name was removed. Continue taking this medication, and follow the directions you see here. Changed by: CLOTILDA CORNWALL   Breztri  Aerosphere 160-9-4.8 MCG/ACT Aero inhaler Generic drug: budesonide-glycopyrrolate -formoterol Inhale 2 puffs into the lungs 2 (two) times daily.   FeroSul 325 (65 Fe) MG tablet Generic drug: ferrous sulfate  Take 1 tablet (325 mg total) by mouth 3 (three) times daily with meals.   hydrALAZINE  50 MG tablet Commonly known as: APRESOLINE  Take 1 tablet (50 mg total) by mouth 2 (two) times daily.   lisinopril -hydrochlorothiazide  20-25 MG tablet Commonly known as: ZESTORETIC  Take 1 tablet by mouth 2 (two) times daily.   metoprolol  tartrate 25 MG tablet Commonly known as: LOPRESSOR  Take 0.5 tablets (12.5 mg total) by mouth 2 (two) times daily.   Premarin  vaginal cream Generic drug: conjugated estrogens  Apply one pea-sized amount around the opening of the urethra daily for 2 weeks, then 3 times weekly moving forward.   rosuvastatin  5 MG tablet Commonly known as: CRESTOR  Take 1 tablet (5 mg total) by mouth daily.   traZODone  50 MG tablet Commonly known as:  DESYREL  Take 1-2 tablets (50-100 mg total) by mouth at bedtime as needed for sleep.   warfarin 2 MG tablet Commonly known as: COUMADIN Take by mouth.        Allergies:  Allergies  Allergen Reactions   Amoxicillin  Other (See Comments)    Joint pains    Family History: Family History  Problem Relation Age of Onset   Arthritis Mother    Cancer Mother    Other Father        unknown medical history   Hypertension Sister    Heart attack Sister    Cancer Sister        breast cancer   Asthma Brother    Dementia Brother    Breast cancer Neg Hx    Kidney cancer Neg Hx    Bladder Cancer Neg Hx     Social History:  reports that she quit smoking about 47 years ago. Her smoking use included cigarettes. She started smoking about 50 years ago. She has a  0.3 pack-year smoking history. She has never used smokeless tobacco. She reports current alcohol use of about 1.0 standard drink of alcohol per week. She reports that she does not use drugs.  ROS: Pertinent ROS in HPI  Physical Exam: BP (!) 140/56 (BP Location: Left Arm, Patient Position: Sitting, Cuff Size: Normal)   Pulse (!) 53   Ht 5' 2 (1.575 m)   Wt 161 lb 6.4 oz (73.2 kg)   BMI 29.52 kg/m   Constitutional:  Well nourished. Alert and oriented, No acute distress. HEENT: [place] AT, moist mucus membranes.  Trachea midline Cardiovascular: No clubbing, cyanosis, or edema. Respiratory: Normal respiratory effort, no increased work of breathing. Neurologic: Grossly intact, no focal deficits, moving all 4 extremities. Psychiatric: Normal mood and affect.    Laboratory Data: See EPIC and HPI I have reviewed the labs. See HPI.  Pertinent Imaging N/A  Assessment & Plan:    1. High risk hematuria -work up in 2022 (CTU, cysto, urine cytology and bladder biopsy) benign -no reports of gross heme -UA negative for micro heme  2. rUTI's - no recent UTI's  3. Vaginal atrophy - Continue vaginal estrogen cream 3 nights weekly,  she does not need refills at this time                                              Return in about 1 year (around 06/20/2025) for UA/PVR .  These notes generated with voice recognition software. I apologize for typographical errors.  CLOTILDA HELON RIGGERS  [facility] [address]  [address] [place], [place] [address] [phone]

## 2024-06-20 ENCOUNTER — Ambulatory Visit (INDEPENDENT_AMBULATORY_CARE_PROVIDER_SITE_OTHER): Admitting: Urology

## 2024-06-20 ENCOUNTER — Encounter: Payer: Self-pay | Admitting: Urology

## 2024-06-20 VITALS — BP 140/56 | HR 53 | Ht 62.0 in | Wt 161.4 lb

## 2024-06-20 DIAGNOSIS — N390 Urinary tract infection, site not specified: Secondary | ICD-10-CM

## 2024-06-20 DIAGNOSIS — R319 Hematuria, unspecified: Secondary | ICD-10-CM

## 2024-06-20 DIAGNOSIS — N952 Postmenopausal atrophic vaginitis: Secondary | ICD-10-CM

## 2024-06-20 LAB — URINALYSIS, COMPLETE
Bilirubin, UA: NEGATIVE
Glucose, UA: NEGATIVE
Ketones, UA: NEGATIVE
Nitrite, UA: NEGATIVE
Protein,UA: NEGATIVE
RBC, UA: NEGATIVE
Specific Gravity, UA: 1.025 (ref 1.005–1.030)
Urobilinogen, Ur: 0.2 mg/dL (ref 0.2–1.0)
pH, UA: 5.5 (ref 5.0–7.5)

## 2024-06-20 LAB — MICROSCOPIC EXAMINATION

## 2024-06-20 NOTE — Progress Notes (Signed)
 In and Out Catheterization  Patient is present today for a I & O catheterization due to UTI symptoms. Patient was cleaned and prepped in a sterile fashion with betadine . A 14FR cath was inserted no complications were noted , 130 ml of urine return was noted, urine was slightly cloudly in color. A clean urine sample was collected for UA . Bladder was drained  And catheter was removed with out difficulty.    Performed by: Logen Fowle Magallon-Mariche,RMA

## 2024-06-28 DIAGNOSIS — I480 Paroxysmal atrial fibrillation: Secondary | ICD-10-CM | POA: Diagnosis not present

## 2024-06-28 DIAGNOSIS — Z7901 Long term (current) use of anticoagulants: Secondary | ICD-10-CM | POA: Diagnosis not present

## 2024-07-23 NOTE — Progress Notes (Signed)
 Kristina Henson                                          MRN: [id number]   07/23/2024   The [facility]I Quality Team Specialist reviewed this patient medical record for the purposes of chart review for care gap closure. The following were reviewed: chart review for care gap closure-controlling blood pressure.    VBCI Quality Team

## 2024-08-01 DIAGNOSIS — I480 Paroxysmal atrial fibrillation: Secondary | ICD-10-CM | POA: Diagnosis not present

## 2024-08-06 ENCOUNTER — Other Ambulatory Visit: Payer: Self-pay | Admitting: Family Medicine

## 2024-08-08 NOTE — Telephone Encounter (Signed)
 Requested Prescriptions  Pending Prescriptions Disp Refills   lisinopril -hydrochlorothiazide  (ZESTORETIC ) 20-25 MG tablet [Pharmacy Med Name: Lisinopril -hydroCHLOROthiazide  20-25 MG Oral Tablet] 180 tablet 0    Sig: Take 1 tablet by mouth twice daily     Cardiovascular:  ACEI + Diuretic Combos Failed - 08/08/2024  3:47 PM      Failed - Last BP in normal range    BP Readings from Last 1 Encounters:  06/20/24 (!) 140/56         Passed - Na in normal range and within 180 days    Sodium  Date Value Ref Range Status  04/26/2024 136 135 - 145 mmol/L Final  04/24/2024 142 134 - 144 mmol/L Final         Passed - K in normal range and within 180 days    Potassium  Date Value Ref Range Status  04/26/2024 3.7 3.5 - 5.1 mmol/L Final         Passed - Cr in normal range and within 180 days    Creatinine  Date Value Ref Range Status  06/23/2023 0.85 0.44 - 1.00 mg/dL Final   Creatinine, Ser  Date Value Ref Range Status  04/26/2024 0.84 0.44 - 1.00 mg/dL Final         Passed - eGFR is 30 or above and within 180 days    GFR calc Af Amer  Date Value Ref Range Status  10/28/2020 76 >59 mL/min/1.73 Final    Comment:    **In accordance with recommendations from the NKF-ASN Task force,**   [facility] is in the process of updating its eGFR calculation to the   2021 CKD-EPI creatinine equation that estimates kidney function   without a race variable.    GFR, Estimated  Date Value Ref Range Status  04/26/2024 >60 >60 mL/min Final    Comment:    (NOTE) Calculated using the CKD-EPI Creatinine Equation (2021)   06/23/2023 >60 >60 mL/min Final    Comment:    (NOTE) Calculated using the CKD-EPI Creatinine Equation (2021)    eGFR  Date Value Ref Range Status  04/24/2024 70 >59 mL/min/1.73 Final         Passed - Patient is not pregnant      Passed - Valid encounter within last 6 months    Recent Outpatient Visits           3 months ago Chronic bronchitis, unspecified chronic  bronchitis type [facility])   [facility] Straughn, Megan P, DO   6 months ago Benign hypertensive renal disease   [place] [facility] Baxter, Megan P, DO   9 months ago Hordeolum externum of left lower eyelid   [place] [facility] Herold Hadassah SQUIBB, MD       Future Appointments             Tomorrow Vicci Duwaine SQUIBB, DO [place] [facility], [address]   In 10 months McGowan, Clotilda DELENA RIGGERS [facility]

## 2024-08-09 ENCOUNTER — Encounter: Admitting: Family Medicine

## 2024-08-09 ENCOUNTER — Encounter: Payer: Self-pay | Admitting: Family Medicine

## 2024-08-09 ENCOUNTER — Ambulatory Visit: Admitting: Family Medicine

## 2024-08-09 VITALS — BP 128/72 | HR 45 | Temp 97.5°F | Ht 62.0 in | Wt 160.2 lb

## 2024-08-09 DIAGNOSIS — D61818 Other pancytopenia: Secondary | ICD-10-CM

## 2024-08-09 DIAGNOSIS — I4891 Unspecified atrial fibrillation: Secondary | ICD-10-CM | POA: Diagnosis not present

## 2024-08-09 DIAGNOSIS — E782 Mixed hyperlipidemia: Secondary | ICD-10-CM | POA: Diagnosis not present

## 2024-08-09 DIAGNOSIS — J441 Chronic obstructive pulmonary disease with (acute) exacerbation: Secondary | ICD-10-CM | POA: Diagnosis not present

## 2024-08-09 DIAGNOSIS — I129 Hypertensive chronic kidney disease with stage 1 through stage 4 chronic kidney disease, or unspecified chronic kidney disease: Secondary | ICD-10-CM

## 2024-08-09 DIAGNOSIS — D500 Iron deficiency anemia secondary to blood loss (chronic): Secondary | ICD-10-CM | POA: Diagnosis not present

## 2024-08-09 LAB — MICROALBUMIN, URINE WAIVED
Creatinine, Urine Waived: 100 mg/dL (ref 10–300)
Microalb, Ur Waived: 80 mg/L — ABNORMAL HIGH (ref 0–19)

## 2024-08-09 MED ORDER — LISINOPRIL-HYDROCHLOROTHIAZIDE 20-25 MG PO TABS
1.0000 | ORAL_TABLET | Freq: Two times a day (BID) | ORAL | 1 refills | Status: AC
Start: 2024-08-09 — End: ?

## 2024-08-09 MED ORDER — HYDRALAZINE HCL 50 MG PO TABS: 50.0000 mg | ORAL_TABLET | Freq: Two times a day (BID) | ORAL | 1 refills | Status: AC

## 2024-08-09 MED ORDER — FEROSUL 325 (65 FE) MG PO TABS
325.0000 mg | ORAL_TABLET | Freq: Three times a day (TID) | ORAL | 1 refills | Status: AC
Start: 2024-08-09 — End: ?

## 2024-08-09 MED ORDER — AMLODIPINE BESYLATE 2.5 MG PO TABS: 2.5000 mg | ORAL_TABLET | Freq: Every day | ORAL | 1 refills | Status: DC

## 2024-08-09 MED ORDER — PREDNISONE 10 MG PO TABS: ORAL_TABLET | ORAL | 0 refills | Status: DC

## 2024-08-09 MED ORDER — ROSUVASTATIN CALCIUM 5 MG PO TABS: 5.0000 mg | ORAL_TABLET | Freq: Every day | ORAL | 1 refills | Status: AC

## 2024-08-09 MED ORDER — TRAZODONE HCL 50 MG PO TABS: 50.0000 mg | ORAL_TABLET | Freq: Every evening | ORAL | 1 refills | Status: AC | PRN

## 2024-08-09 MED ORDER — METOPROLOL TARTRATE 25 MG PO TABS: 12.5000 mg | ORAL_TABLET | Freq: Two times a day (BID) | ORAL | 1 refills | Status: AC

## 2024-08-09 MED ORDER — ALBUTEROL SULFATE HFA 108 (90 BASE) MCG/ACT IN AERS: 2.0000 | INHALATION_SPRAY | Freq: Four times a day (QID) | RESPIRATORY_TRACT | 3 refills | Status: AC | PRN

## 2024-08-09 MED ORDER — BREZTRI AEROSPHERE 160-9-4.8 MCG/ACT IN AERO: 2.0000 | INHALATION_SPRAY | Freq: Two times a day (BID) | RESPIRATORY_TRACT | 11 refills | Status: AC

## 2024-08-09 NOTE — Assessment & Plan Note (Signed)
 Under good control on current regimen. Continue current regimen. Continue to monitor. Call with any concerns. Refills given. Labs drawn today.

## 2024-08-09 NOTE — Assessment & Plan Note (Signed)
 In exacerbation. Will treat with prednisone  and recheck in 2 weeks. Call with any concerns. Continue to monitor.

## 2024-08-09 NOTE — Assessment & Plan Note (Signed)
 Rechecking labs today. Await results. Treat as needed.

## 2024-08-09 NOTE — Progress Notes (Signed)
 BP 128/72   Pulse (!) 45   Temp (!) 97.5 F (36.4 C) (Oral)   Ht 5' 2 (1.575 m)   Wt 160 lb 3.2 oz (72.7 kg)   SpO2 95%   BMI 29.30 kg/m    Subjective:    Patient ID: Kristina Henson, female    DOB: 1956-04-22, 68 y.o.   MRN: [id number]  HPI: Kristina Henson is a 68 y.o. female  Chief Complaint  Patient presents with   Cough    Onset about a week. OTC meds minimal relief    Nasal Congestion    Runny nose   UPPER RESPIRATORY TRACT INFECTION Duration: about a week Worst symptom: runny nose, cough, watery eyes Fever: no Cough: yes Shortness of breath: no Wheezing: no Chest pain: no Chest tightness: no Chest congestion: no Nasal congestion: no Runny nose: yes Post nasal drip: no Sneezing: yes Sore throat: no Swollen glands: no Sinus pressure: no Headache: no Face pain: no Toothache: no Ear pain: no  Ear pressure: no  Eyes red/itching:no Eye drainage/crusting: yes  Vomiting: no Rash: no Fatigue: no Sick contacts: no Strep contacts: no  Context: better Recurrent sinusitis: no Relief with OTC cold/cough medications: no  Treatments attempted: cold/sinus, mucinex , and anti-histamine   HYPERTENSION  Hypertension status: controlled  Satisfied with current treatment? yes Duration of hypertension: chronic BP monitoring frequency:  not checking BP medication side effects:  no Medication compliance: excellent compliance Previous BP meds: hydralazine , metoprolol , lisinopril , hydrochlorothiazide , amlodipine  Aspirin: no Recurrent headaches: no Visual changes: no Palpitations: no Dyspnea: no Chest pain: no Lower extremity edema: no Dizzy/lightheaded: no  ANEMIA Anemia status: controlled Etiology of anemia: iron  deficiency Duration of anemia treatment: chronic Compliance with treatment: excellent compliance Iron  supplementation side effects: no Severity of anemia: moderate Fatigue: no Decreased exercise tolerance: no  Dyspnea on exertion:  no Palpitations: no Bleeding: no Pica: no   Relevant past medical, surgical, family and social history reviewed and updated as indicated. Interim medical history since our last visit reviewed. Allergies and medications reviewed and updated.  Review of Systems  Constitutional:  Positive for chills, diaphoresis and fatigue. Negative for activity change, appetite change, fever and unexpected weight change.  HENT:  Positive for congestion, postnasal drip and rhinorrhea. Negative for dental problem, drooling, ear discharge, ear pain, facial swelling, hearing loss, mouth sores, nosebleeds, sinus pressure, sinus pain, sneezing, sore throat, tinnitus, trouble swallowing and voice change.   Eyes: Negative.   Respiratory:  Positive for cough. Negative for apnea, choking, chest tightness, shortness of breath, wheezing and stridor.   Cardiovascular: Negative.   Gastrointestinal: Negative.   Neurological: Negative.   Psychiatric/Behavioral: Negative.      Per HPI unless specifically indicated above     Objective:    BP 128/72   Pulse (!) 45   Temp (!) 97.5 F (36.4 C) (Oral)   Ht 5' 2 (1.575 m)   Wt 160 lb 3.2 oz (72.7 kg)   SpO2 95%   BMI 29.30 kg/m   Wt Readings from Last 3 Encounters:  08/09/24 160 lb 3.2 oz (72.7 kg)  06/20/24 161 lb 6.4 oz (73.2 kg)  04/26/24 165 lb 6.4 oz (75 kg)    Physical Exam Vitals and nursing note reviewed.  Constitutional:      General: She is not in acute distress.    Appearance: Normal appearance. She is not ill-appearing, toxic-appearing or diaphoretic.  HENT:     Head: Normocephalic and atraumatic.     Right  Ear: Tympanic membrane, ear canal and external ear normal.     Left Ear: Tympanic membrane, ear canal and external ear normal.     Nose: Rhinorrhea present. No congestion.     Mouth/Throat:     Mouth: Mucous membranes are moist.     Pharynx: Oropharynx is clear. No oropharyngeal exudate or posterior oropharyngeal erythema.  Eyes:      General: No scleral icterus.       Right eye: No discharge.        Left eye: No discharge.     Extraocular Movements: Extraocular movements intact.     Conjunctiva/sclera: Conjunctivae normal.     Pupils: Pupils are equal, round, and reactive to light.  Cardiovascular:     Rate and Rhythm: Normal rate and regular rhythm.     Pulses: Normal pulses.     Heart sounds: Normal heart sounds. No murmur heard.    No friction rub. No gallop.  Pulmonary:     Effort: Pulmonary effort is normal. No respiratory distress.     Breath sounds: Normal breath sounds. No stridor. No wheezing, rhonchi or rales.  Chest:     Chest wall: No tenderness.  Musculoskeletal:        General: Normal range of motion.     Cervical back: Normal range of motion and neck supple.  Skin:    General: Skin is warm and dry.     Capillary Refill: Capillary refill takes less than 2 seconds.     Coloration: Skin is not jaundiced or pale.     Findings: No bruising, erythema, lesion or rash.  Neurological:     General: No focal deficit present.     Mental Status: She is alert and oriented to person, place, and time. Mental status is at baseline.  Psychiatric:        Mood and Affect: Mood normal.        Behavior: Behavior normal.        Thought Content: Thought content normal.        Judgment: Judgment normal.     Results for orders placed or performed in visit on 08/09/24  Microalbumin, Urine Waived   Collection Time: 08/09/24 10:49 AM  Result Value Ref Range   Microalb, Ur Waived 80 (H) 0 - 19 mg/L   Creatinine, Urine Waived 100 10 - 300 mg/dL   Microalb/Creat Ratio 30-300 (H) <30 mg/g      Assessment & Plan:   Problem List Items Addressed This Visit       Cardiovascular and Mediastinum   Atrial fibrillation (HCC)   Continue to follow with cardiology. Stable. Labs drawn today. Call with any concerns.       Relevant Medications   amLODipine  (NORVASC ) 2.5 MG tablet   hydrALAZINE  (APRESOLINE ) 50 MG tablet    lisinopril -hydrochlorothiazide  (ZESTORETIC ) 20-25 MG tablet   metoprolol  tartrate (LOPRESSOR ) 25 MG tablet   rosuvastatin  (CRESTOR ) 5 MG tablet     Respiratory   COPD (chronic obstructive pulmonary disease) (HCC) - Primary   In exacerbation. Will treat with prednisone  and recheck in 2 weeks. Call with any concerns. Continue to monitor.       Relevant Medications   albuterol  (VENTOLIN  HFA) 108 (90 Base) MCG/ACT inhaler   budesonide-glycopyrrolate -formoterol (BREZTRI  AEROSPHERE) 160-9-4.8 MCG/ACT AERO inhaler   predniSONE  (DELTASONE ) 10 MG tablet     Genitourinary   Benign hypertensive renal disease   Under good control on current regimen. Continue current regimen. Continue to monitor. Call with any  concerns. Refills given. Labs drawn today.        Relevant Orders   Comprehensive metabolic panel with GFR   TSH   Microalbumin, Urine Waived (Completed)     Hematopoietic and Hemostatic   Other pancytopenia (HCC)   Rechecking labs today. Await results. Treat as needed.       Relevant Medications   FEROSUL 325 (65 Fe) MG tablet   Other Relevant Orders   CBC with Differential/Platelet     Other   HLD (hyperlipidemia)   Under good control on current regimen. Continue current regimen. Continue to monitor. Call with any concerns. Refills given. Labs drawn today.        Relevant Medications   amLODipine  (NORVASC ) 2.5 MG tablet   hydrALAZINE  (APRESOLINE ) 50 MG tablet   lisinopril -hydrochlorothiazide  (ZESTORETIC ) 20-25 MG tablet   metoprolol  tartrate (LOPRESSOR ) 25 MG tablet   rosuvastatin  (CRESTOR ) 5 MG tablet   Other Relevant Orders   Comprehensive metabolic panel with GFR   Lipid Panel w/o Chol/HDL Ratio   Iron  deficiency anemia due to chronic blood loss   Rechecking labs today. Await results. Treat as needed.       Relevant Medications   FEROSUL 325 (65 Fe) MG tablet   Other Relevant Orders   Ferritin   Iron  Binding Cap (TIBC)([facility])     Follow up  plan: Return 2-3 weeks lung recheck at physical.

## 2024-08-09 NOTE — Assessment & Plan Note (Signed)
 Continue to follow with cardiology. Stable. Labs drawn today. Call with any concerns.

## 2024-08-11 LAB — COMPREHENSIVE METABOLIC PANEL WITH GFR
ALT: 60 IU/L — ABNORMAL HIGH (ref 0–32)
AST: 63 IU/L — ABNORMAL HIGH (ref 0–40)
Albumin: 4.4 g/dL (ref 3.9–4.9)
Alkaline Phosphatase: 83 IU/L (ref 49–135)
BUN/Creatinine Ratio: 24 (ref 12–28)
BUN: 22 mg/dL (ref 8–27)
Bilirubin Total: 0.9 mg/dL (ref 0.0–1.2)
CO2: 24 mmol/L (ref 20–29)
Calcium: 9.7 mg/dL (ref 8.7–10.3)
Chloride: 100 mmol/L (ref 96–106)
Creatinine, Ser: 0.9 mg/dL (ref 0.57–1.00)
Globulin, Total: 3.2 g/dL (ref 1.5–4.5)
Glucose: 99 mg/dL (ref 70–99)
Potassium: 3.4 mmol/L — ABNORMAL LOW (ref 3.5–5.2)
Sodium: 140 mmol/L (ref 134–144)
Total Protein: 7.6 g/dL (ref 6.0–8.5)
eGFR: 71 mL/min/1.73 (ref >59)

## 2024-08-11 LAB — CBC WITH DIFFERENTIAL/PLATELET
Basophils Absolute: 0 x10E3/uL (ref 0.0–0.2)
Basos: 1 %
EOS (ABSOLUTE): 0 x10E3/uL (ref 0.0–0.4)
Eos: 1 %
Hematocrit: 40.5 % (ref 34.0–46.6)
Hemoglobin: 12.4 g/dL (ref 11.1–15.9)
Immature Grans (Abs): 0 x10E3/uL (ref 0.0–0.1)
Immature Granulocytes: 0 %
Lymphocytes Absolute: 0.6 x10E3/uL — ABNORMAL LOW (ref 0.7–3.1)
Lymphs: 14 %
MCH: 25.8 pg — ABNORMAL LOW (ref 26.6–33.0)
MCHC: 30.6 g/dL — ABNORMAL LOW (ref 31.5–35.7)
MCV: 84 fL (ref 79–97)
Monocytes Absolute: 0.3 x10E3/uL (ref 0.1–0.9)
Monocytes: 8 %
Neutrophils Absolute: 3 x10E3/uL (ref 1.4–7.0)
Neutrophils: 76 %
Platelets: 146 x10E3/uL — ABNORMAL LOW (ref 150–450)
RBC: 4.81 x10E6/uL (ref 3.77–5.28)
RDW: 17.4 % — ABNORMAL HIGH (ref 11.7–15.4)
WBC: 4 x10E3/uL (ref 3.4–10.8)

## 2024-08-11 LAB — LIPID PANEL W/O CHOL/HDL RATIO
Cholesterol, Total: 131 mg/dL (ref 100–199)
HDL: 43 mg/dL (ref >39)
LDL Chol Calc (NIH): 68 mg/dL (ref 0–99)
Triglycerides: 108 mg/dL (ref 0–149)
VLDL Cholesterol Cal: 20 mg/dL (ref 5–40)

## 2024-08-11 LAB — IRON AND TIBC
Iron Saturation: 14 % — ABNORMAL LOW (ref 15–55)
Iron: 47 ug/dL (ref 27–139)
Total Iron Binding Capacity: 327 ug/dL (ref 250–450)
UIBC: 280 ug/dL (ref 118–369)

## 2024-08-11 LAB — FERRITIN: Ferritin: 202 ng/mL — ABNORMAL HIGH (ref 15–150)

## 2024-08-11 LAB — TSH: TSH: 1.22 u[IU]/mL (ref 0.450–4.500)

## 2024-08-21 ENCOUNTER — Ambulatory Visit: Payer: Self-pay | Admitting: Family Medicine

## 2024-09-06 ENCOUNTER — Ambulatory Visit (INDEPENDENT_AMBULATORY_CARE_PROVIDER_SITE_OTHER): Admitting: Family Medicine

## 2024-09-06 ENCOUNTER — Encounter: Payer: Self-pay | Admitting: Family Medicine

## 2024-09-06 VITALS — BP 160/80 | HR 48 | Temp 98.1°F | Ht 62.0 in | Wt 162.4 lb

## 2024-09-06 DIAGNOSIS — I129 Hypertensive chronic kidney disease with stage 1 through stage 4 chronic kidney disease, or unspecified chronic kidney disease: Secondary | ICD-10-CM | POA: Diagnosis not present

## 2024-09-06 DIAGNOSIS — E782 Mixed hyperlipidemia: Secondary | ICD-10-CM | POA: Diagnosis not present

## 2024-09-06 DIAGNOSIS — Z0000 Encounter for general adult medical examination without abnormal findings: Secondary | ICD-10-CM | POA: Diagnosis not present

## 2024-09-06 DIAGNOSIS — J449 Chronic obstructive pulmonary disease, unspecified: Secondary | ICD-10-CM

## 2024-09-06 DIAGNOSIS — D500 Iron deficiency anemia secondary to blood loss (chronic): Secondary | ICD-10-CM

## 2024-09-06 DIAGNOSIS — H00015 Hordeolum externum left lower eyelid: Secondary | ICD-10-CM

## 2024-09-06 DIAGNOSIS — J42 Unspecified chronic bronchitis: Secondary | ICD-10-CM

## 2024-09-06 LAB — MICROALBUMIN, URINE WAIVED
Creatinine, Urine Waived: 100 mg/dL (ref 10–300)
Microalb, Ur Waived: 30 mg/L — ABNORMAL HIGH (ref 0–19)
Microalb/Creat Ratio: <30 mg/g (ref <30)

## 2024-09-06 MED ORDER — AMLODIPINE BESYLATE 5 MG PO TABS: 5.0000 mg | ORAL_TABLET | Freq: Every day | ORAL | 0 refills | Status: AC

## 2024-09-06 NOTE — Assessment & Plan Note (Signed)
 Under good control on current regimen. Continue current regimen. Continue to monitor. Call with any concerns. Refills given. Labs drawn today.

## 2024-09-06 NOTE — Progress Notes (Signed)
 BP (!) 160/80   Pulse (!) 48   Temp 98.1 F (36.7 C) (Oral)   Ht 5' 2 (1.575 m)   Wt 162 lb 6.4 oz (73.7 kg)   SpO2 95%   BMI 29.70 kg/m    Subjective:    Patient ID: Kristina Henson, female    DOB: 1955-12-28, 68 y.o.   MRN: [id number]  HPI: Kristina Henson is a 68 y.o. female presenting on 09/06/2024 for comprehensive medical examination. Current medical complaints include:  HYPERTENSION / HYPERLIPIDEMIA Satisfied with current treatment? yes Duration of hypertension: chronic BP monitoring frequency: not checking BP medication side effects: no Past BP meds: amlodipine , lisinopril , hydrochlorothiazide , metoprolol , hydralazine  Duration of hyperlipidemia: chronic Cholesterol medication side effects: no Cholesterol supplements: none Past cholesterol medications: crestor  Medication compliance: excellent compliance Aspirin: no Recent stressors: no Recurrent headaches: no Visual changes: no Palpitations: no Dyspnea: no Chest pain: no Lower extremity edema: no Dizzy/lightheaded: no  COPD COPD status: controlled Satisfied with current treatment?: yes Oxygen use: no Dyspnea frequency: occasionally  Cough frequency: rarely Rescue inhaler frequency: rarely   Limitation of activity: no Productive cough: no Pneumovax: Up to Date Influenza: Up to Date  Menopausal Symptoms: no  Depression Screen done today and results listed below:     08/09/2024   10:14 AM 04/26/2024   10:29 AM 04/24/2024    1:50 PM 02/07/2024   11:28 AM 11/22/2023    8:15 AM  Depression screen PHQ 2/9  Decreased Interest 0 0 0 0 0  Down, Depressed, Hopeless 0 0 0 0 0  PHQ - 2 Score 0 0 0 0 0  Altered sleeping 0 0 0 0 0  Tired, decreased energy 0 0 0 0 0  Change in appetite 0 0 0 0 0  Feeling bad or failure about yourself  0 0 0 0 0  Trouble concentrating 0 0 0 0 0  Moving slowly or fidgety/restless 0 0 0 0 0  Suicidal thoughts 0 0 0 0 0  PHQ-9 Score 0 0  0  0  0   Difficult doing work/chores  Not difficult at all  Not difficult at all       Data saved with a previous flowsheet row definition    Past Medical History:  Past Medical History:  Diagnosis Date   Aortic atherosclerosis    Arthritis    BACK-LUMBAR   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   Atrial fibrillation (HCC)    a.) CHA2DS2-VASc = 5 (sex, HTN, CVA x 2, aortic plaque). b.) s/p ablation in 2008. c.) rate/rhythm maintained on oral amiodarone ; not currently on daily anticoagulation.   Coronary artery disease    Duodenitis    Gastritis    HLD (hyperlipidemia)    Hypertension    IDA (iron  deficiency anemia)    LBBB (left bundle branch block)    Mass of left submandibular region 04/29/2016   a.) FNA 04/29/2016 --> Bx (+) for pleomorphic adenoma; s/p resection   PAH (pulmonary artery hypertension) (HCC) 07/24/2020   a.) CT 07/24/2020 showed enlarged pulmonary artery consistent with PAH.   Pneumonia 2016   Sleep apnea    Stroke [facility]) 2012    Surgical History:  Past Surgical History:  Procedure Laterality Date   BIOPSY SALIVARY GLAND Left 04/29/2016   Procedure: SUBMANDIBULAR GLAND BIOPSY (FNA); Location: [facility]; Surgeon: Juliene Balder, MD (IR)   cardiac catherization     CARDIAC ELECTROPHYSIOLOGY STUDY AND ABLATION  N/A 2008   Procedure: ATRIAL FIBRILLATION ABLATION; Location: [facility]    COLONOSCOPY WITH PROPOFOL  N/A 11/18/2015   Procedure: COLONOSCOPY WITH PROPOFOL ;  Surgeon: Rogelia Copping, MD;  Location: [facility];  Service: Endoscopy;  Laterality: N/A;   COLONOSCOPY WITH PROPOFOL  N/A 12/02/2020   Procedure: COLONOSCOPY WITH PROPOFOL ;  Surgeon: Copping Rogelia, MD;  Location: [facility];  Service: Endoscopy;  Laterality: N/A;   CORONARY ANGIOPLASTY     CYSTOSCOPY WITH BIOPSY N/A 11/30/2021   Procedure: CYSTOSCOPY WITH BLADDER BIOPSY;  Surgeon: Penne Knee, MD;  Location: [facility];  Service: Urology;  Laterality: N/A;    ESOPHAGOGASTRODUODENOSCOPY (EGD) WITH PROPOFOL  N/A 04/23/2016   Procedure: ESOPHAGOGASTRODUODENOSCOPY (EGD) WITH PROPOFOL ;  Surgeon: Rogelia Copping, MD;  Location: [facility];  Service: Endoscopy;  Laterality: N/A;  small bowel bx gastric antrum bx   SUBMANDIBULAR GLAND EXCISION Left 09/08/2017   Procedure: EXCISION SUBMANDIBULAR GLAND;  Surgeon: Milissa Hamming, MD;  Location: [facility];  Service: ENT;  Laterality: Left;   TOTAL ABDOMINAL HYSTERECTOMY W/ BILATERAL SALPINGOOPHORECTOMY     Total    Medications:  Current Outpatient Medications on File Prior to Visit  Medication Sig   albuterol  (VENTOLIN  HFA) 108 (90 Base) MCG/ACT inhaler Inhale 2 puffs into the lungs every 6 (six) hours as needed for wheezing or shortness of breath.   amiodarone  (PACERONE ) 200 MG tablet Take 1 tablet (200 mg total) by mouth every morning.   baclofen  (LIORESAL ) 10 MG tablet Take 5-10 mg by mouth. (Patient taking differently: Take 5-10 mg by mouth as needed for muscle spasms.)   budesonide-glycopyrrolate -formoterol (BREZTRI  AEROSPHERE) 160-9-4.8 MCG/ACT AERO inhaler Inhale 2 puffs into the lungs 2 (two) times daily.   conjugated estrogens  (PREMARIN ) vaginal cream Apply one pea-sized amount around the opening of the urethra daily for 2 weeks, then 3 times weekly moving forward.   FEROSUL 325 (65 Fe) MG tablet Take 1 tablet (325 mg total) by mouth 3 (three) times daily with meals.   hydrALAZINE  (APRESOLINE ) 50 MG tablet Take 1 tablet (50 mg total) by mouth 2 (two) times daily.   lisinopril -hydrochlorothiazide  (ZESTORETIC ) 20-25 MG tablet Take 1 tablet by mouth 2 (two) times daily.   metoprolol  tartrate (LOPRESSOR ) 25 MG tablet Take 0.5 tablets (12.5 mg total) by mouth 2 (two) times daily.   rosuvastatin  (CRESTOR ) 5 MG tablet Take 1 tablet (5 mg total) by mouth daily.   traZODone  (DESYREL ) 50 MG tablet Take 1-2 tablets (50-100 mg total) by mouth at bedtime as needed for sleep.   warfarin (COUMADIN) 2 MG tablet  Take by mouth.   No current facility-administered medications on file prior to visit.    Allergies:  Allergies[1]  Social History:  Social History   Socioeconomic History   Marital status: Divorced    Spouse name: Not on file   Number of children: 1   Years of education: 12   Highest education level: 12th grade  Occupational History   Occupation: disability   Tobacco Use   Smoking status: Former    Current packs/day: 0.00    Average packs/day: 0.1 packs/day for 3.0 years (0.3 ttl pk-yrs)    Types: Cigarettes    Start date: 45    Quit date: 1978    Years since quitting: 47.9   Smokeless tobacco: Never   Tobacco comments:    didnt smoke much   Vaping Use   Vaping status: Never Used  Substance and Sexual Activity   Alcohol use: Yes    Alcohol/week: 1.0  standard drink of alcohol    Types: 1 Glasses of wine per week    Comment: occasionally   Drug use: No   Sexual activity: Not Currently  Other Topics Concern   Not on file  Social History Narrative   Not on file   Social Drivers of Health   Tobacco Use: Medium Risk (09/06/2024)   Patient History    Smoking Tobacco Use: Former    Smokeless Tobacco Use: Never    Passive Exposure: Not on file  Financial Resource Strain: Low Risk (11/22/2023)   Overall Financial Resource Strain (CARDIA)    Difficulty of Paying Living Expenses: Not hard at all  Food Insecurity: No Food Insecurity (11/22/2023)   Hunger Vital Sign    Worried About Running Out of Food in the Last Year: Never true    Ran Out of Food in the Last Year: Never true  Transportation Needs: No Transportation Needs (11/22/2023)   PRAPARE - Administrator, Civil Service (Medical): No    Lack of Transportation (Non-Medical): No  Physical Activity: Inactive (11/22/2023)   Exercise Vital Sign    Days of Exercise per Week: 0 days    Minutes of Exercise per Session: 0 min  Stress: No Stress Concern Present (11/22/2023)   [employer] of  Occupational Health - Occupational Stress Questionnaire    Feeling of Stress : Not at all  Social Connections: Socially Isolated (11/22/2023)   Social Connection and Isolation Panel    Frequency of Communication with Friends and Family: More than three times a week    Frequency of Social Gatherings with Friends and Family: More than three times a week    Attends Religious Services: Never    Database Administrator or Organizations: No    Attends Banker Meetings: Never    Marital Status: Divorced  Catering Manager Violence: Not At Risk (11/22/2023)   Humiliation, Afraid, Rape, and Kick questionnaire    Fear of Current or Ex-Partner: No    Emotionally Abused: No    Physically Abused: No    Sexually Abused: No  Depression (PHQ2-9): Low Risk (08/09/2024)   Depression (PHQ2-9)    PHQ-2 Score: 0  Alcohol Screen: Low Risk (11/22/2023)   Alcohol Screen    Last Alcohol Screening Score (AUDIT): 2  Housing: Unknown (01/25/2024)   Received from [facility]   Epic    Unable to Pay for Housing in the Last Year: Not on file    Number of Times Moved in the Last Year: Not on file    At any time in the past 12 months, were you homeless or living in a shelter (including now)?: No  Utilities: Not At Risk (11/22/2023)   [facility] Utilities    Threatened with loss of utilities: No  Health Literacy: Adequate Health Literacy (11/22/2023)   B1300 Health Literacy    Frequency of need for help with medical instructions: Never   Tobacco Use History[2] Social History   Substance and Sexual Activity  Alcohol Use Yes   Alcohol/week: 1.0 standard drink of alcohol   Types: 1 Glasses of wine per week   Comment: occasionally    Family History:  Family History  Problem Relation Age of Onset   Arthritis Mother    Cancer Mother    Other Father        unknown medical history   Hypertension Sister    Heart attack Sister    Cancer Sister  breast cancer   Asthma Brother     Dementia Brother    Breast cancer Neg Hx    Kidney cancer Neg Hx    Bladder Cancer Neg Hx     Past medical history, surgical history, medications, allergies, family history and social history reviewed with patient today and changes made to appropriate areas of the chart.   Review of Systems  Constitutional: Negative.   HENT: Negative.    Eyes: Negative.        + stye on L eye  Respiratory: Negative.    Cardiovascular: Negative.   Gastrointestinal: Negative.   Genitourinary: Negative.   Musculoskeletal: Negative.   Skin: Negative.   Neurological: Negative.   Endo/Heme/Allergies:  Negative for environmental allergies and polydipsia. Bruises/bleeds easily.  Psychiatric/Behavioral: Negative.     All other ROS negative except what is listed above and in the HPI.      Objective:    BP (!) 160/80   Pulse (!) 48   Temp 98.1 F (36.7 C) (Oral)   Ht 5' 2 (1.575 m)   Wt 162 lb 6.4 oz (73.7 kg)   SpO2 95%   BMI 29.70 kg/m   Wt Readings from Last 3 Encounters:  09/06/24 162 lb 6.4 oz (73.7 kg)  08/09/24 160 lb 3.2 oz (72.7 kg)  06/20/24 161 lb 6.4 oz (73.2 kg)    Physical Exam Vitals and nursing note reviewed.  Constitutional:      General: She is not in acute distress.    Appearance: Normal appearance. She is not ill-appearing, toxic-appearing or diaphoretic.  HENT:     Head: Normocephalic and atraumatic.     Right Ear: Tympanic membrane, ear canal and external ear normal. There is no impacted cerumen.     Left Ear: Tympanic membrane, ear canal and external ear normal. There is no impacted cerumen.     Nose: Nose normal. No congestion or rhinorrhea.     Mouth/Throat:     Mouth: Mucous membranes are moist.     Pharynx: Oropharynx is clear. No oropharyngeal exudate or posterior oropharyngeal erythema.  Eyes:     General: No scleral icterus.       Right eye: No discharge.        Left eye: No discharge.     Extraocular Movements: Extraocular movements intact.      Conjunctiva/sclera: Conjunctivae normal.     Pupils: Pupils are equal, round, and reactive to light.     Comments: + stye L lower lid  Neck:     Vascular: No carotid bruit.  Cardiovascular:     Rate and Rhythm: Normal rate and regular rhythm.     Pulses: Normal pulses.     Heart sounds: No murmur heard.    No friction rub. No gallop.  Pulmonary:     Effort: Pulmonary effort is normal. No respiratory distress.     Breath sounds: Normal breath sounds. No stridor. No wheezing, rhonchi or rales.  Chest:     Chest wall: No tenderness.  Abdominal:     General: Abdomen is flat. Bowel sounds are normal. There is no distension.     Palpations: Abdomen is soft. There is no mass.     Tenderness: There is no abdominal tenderness. There is no right CVA tenderness, left CVA tenderness, guarding or rebound.     Hernia: No hernia is present.  Genitourinary:    Comments: Breast and pelvic exams deferred with shared decision making Musculoskeletal:  General: No swelling, tenderness, deformity or signs of injury.     Cervical back: Normal range of motion and neck supple. No rigidity. No muscular tenderness.     Right lower leg: No edema.     Left lower leg: No edema.  Lymphadenopathy:     Cervical: No cervical adenopathy.  Skin:    General: Skin is warm and dry.     Capillary Refill: Capillary refill takes less than 2 seconds.     Coloration: Skin is not jaundiced or pale.     Findings: No bruising, erythema, lesion or rash.  Neurological:     General: No focal deficit present.     Mental Status: She is alert and oriented to person, place, and time. Mental status is at baseline.     Cranial Nerves: No cranial nerve deficit.     Sensory: No sensory deficit.     Motor: No weakness.     Coordination: Coordination normal.     Gait: Gait normal.     Deep Tendon Reflexes: Reflexes normal.  Psychiatric:        Mood and Affect: Mood normal.        Behavior: Behavior normal.        Thought  Content: Thought content normal.        Judgment: Judgment normal.     Results for orders placed or performed in visit on 09/06/24  Microalbumin, Urine Waived   Collection Time: 09/06/24  9:46 AM  Result Value Ref Range   Microalb, Ur Waived 30 (H) 0 - 19 mg/L   Creatinine, Urine Waived 100 10 - 300 mg/dL   Microalb/Creat Ratio <30 <30 mg/g      Assessment & Plan:   Problem List Items Addressed This Visit       Respiratory   COPD (chronic obstructive pulmonary disease) (HCC)   Under good control on current regimen. Continue current regimen. Continue to monitor. Call with any concerns. Refills given. Labs drawn today.         Genitourinary   Benign hypertensive renal disease   Running high. Will increase her amlodipine  to 5mg  and recheck 19month. Call with any concerns.       Relevant Orders   Comprehensive metabolic panel with GFR   TSH   Microalbumin, Urine Waived (Completed)     Other   HLD (hyperlipidemia)   Under good control on current regimen. Continue current regimen. Continue to monitor. Call with any concerns. Refills given. Labs drawn today.        Relevant Medications   amLODipine  (NORVASC ) 5 MG tablet   Other Relevant Orders   Comprehensive metabolic panel with GFR   Lipid Panel w/o Chol/HDL Ratio   Iron  deficiency anemia due to chronic blood loss   Under good control on current regimen. Continue current regimen. Continue to monitor. Call with any concerns. Refills given. Labs drawn today.       Relevant Orders   CBC with Differential/Platelet   Ferritin   Iron  Binding Cap (TIBC)([facility])   Other Visit Diagnoses       Routine general medical examination at a health care facility    -  Primary   Vaccines up to date. Screening labs checked today. Mammo, colonoscopy and DEXA up to date. Continue diet and exercise. Call with any concerns.     Hordeolum externum of left lower eyelid       Will continue erythromycin  and start warm compresses.  Call with any concerns. Continue to  monitor.        Follow up plan: Return in about 6 weeks (around 10/18/2024).   LABORATORY TESTING:  - Pap smear: not applicable  IMMUNIZATIONS:   - Tdap: Tetanus vaccination status reviewed: last tetanus booster within 10 years. - Influenza: Up to date - Pneumovax: Up to date - Prevnar: Up to date - COVID: Up to date - HPV: Not applicable - Shingrix vaccine: Given elsewhere  SCREENING: -Mammogram: Up to date  - Colonoscopy: Up to date  - Bone Density: Up to date   PATIENT COUNSELING:   Advised to take 1 mg of folate supplement per day if capable of pregnancy.   Sexuality: Discussed sexually transmitted diseases, partner selection, use of condoms, avoidance of unintended pregnancy  and contraceptive alternatives.   Advised to avoid cigarette smoking.  I discussed with the patient that most people either abstain from alcohol or drink within safe limits (<=14/week and <=4 drinks/occasion for males, <=7/weeks and <= 3 drinks/occasion for females) and that the risk for alcohol disorders and other health effects rises proportionally with the number of drinks per week and how often a drinker exceeds daily limits.  Discussed cessation/primary prevention of drug use and availability of treatment for abuse.   Diet: Encouraged to adjust caloric intake to maintain  or achieve ideal body weight, to reduce intake of dietary saturated fat and total fat, to limit sodium intake by avoiding high sodium foods and not adding table salt, and to maintain adequate dietary potassium and calcium  preferably from fresh fruits, vegetables, and low-fat dairy products.    stressed the importance of regular exercise  Injury prevention: Discussed safety belts, safety helmets, smoke detector, smoking near bedding or upholstery.   Dental health: Discussed importance of regular tooth brushing, flossing, and dental visits.    NEXT PREVENTATIVE PHYSICAL DUE IN 1  YEAR. Return in about 6 weeks (around 10/18/2024).               [1]  Allergies Allergen Reactions   Amoxicillin  Other (See Comments)    Joint pains  [2]  Social History Tobacco Use  Smoking Status Former   Current packs/day: 0.00   Average packs/day: 0.1 packs/day for 3.0 years (0.3 ttl pk-yrs)   Types: Cigarettes   Start date: 78   Quit date: 73   Years since quitting: 47.9  Smokeless Tobacco Never  Tobacco Comments   didnt smoke much

## 2024-09-06 NOTE — Patient Instructions (Addendum)
 Take 2 of the AMLODIPINE  you have at home (2.5mg ) until you use it up- then start the 5mg  at the pharmacy

## 2024-09-06 NOTE — Assessment & Plan Note (Signed)
 Running high. Will increase her amlodipine  to 5mg  and recheck 30month. Call with any concerns.

## 2024-09-07 ENCOUNTER — Ambulatory Visit: Payer: Self-pay | Admitting: Family Medicine

## 2024-09-07 LAB — COMPREHENSIVE METABOLIC PANEL WITH GFR
ALT: 49 IU/L — ABNORMAL HIGH (ref 0–32)
AST: 46 IU/L — ABNORMAL HIGH (ref 0–40)
Albumin: 3.9 g/dL (ref 3.9–4.9)
Alkaline Phosphatase: 67 IU/L (ref 49–135)
BUN/Creatinine Ratio: 33 — ABNORMAL HIGH (ref 12–28)
BUN: 27 mg/dL (ref 8–27)
Bilirubin Total: 0.5 mg/dL (ref 0.0–1.2)
CO2: 27 mmol/L (ref 20–29)
Calcium: 9 mg/dL (ref 8.7–10.3)
Chloride: 98 mmol/L (ref 96–106)
Creatinine, Ser: 0.82 mg/dL (ref 0.57–1.00)
Globulin, Total: 2.5 g/dL (ref 1.5–4.5)
Glucose: 96 mg/dL (ref 70–99)
Potassium: 3.5 mmol/L (ref 3.5–5.2)
Sodium: 138 mmol/L (ref 134–144)
Total Protein: 6.4 g/dL (ref 6.0–8.5)
eGFR: 79 mL/min/1.73 (ref >59)

## 2024-09-07 LAB — CBC WITH DIFFERENTIAL/PLATELET
Basophils Absolute: 0 x10E3/uL (ref 0.0–0.2)
Basos: 1 %
EOS (ABSOLUTE): 0 x10E3/uL (ref 0.0–0.4)
Eos: 1 %
Hematocrit: 36.8 % (ref 34.0–46.6)
Hemoglobin: 11.8 g/dL (ref 11.1–15.9)
Immature Grans (Abs): 0 x10E3/uL (ref 0.0–0.1)
Immature Granulocytes: 0 %
Lymphocytes Absolute: 0.8 x10E3/uL (ref 0.7–3.1)
Lymphs: 18 %
MCH: 26.6 pg (ref 26.6–33.0)
MCHC: 32.1 g/dL (ref 31.5–35.7)
MCV: 83 fL (ref 79–97)
Monocytes Absolute: 0.5 x10E3/uL (ref 0.1–0.9)
Monocytes: 12 %
Neutrophils Absolute: 2.9 x10E3/uL (ref 1.4–7.0)
Neutrophils: 67 %
Platelets: 112 x10E3/uL — ABNORMAL LOW (ref 150–450)
RBC: 4.44 x10E6/uL (ref 3.77–5.28)
RDW: 17 % — ABNORMAL HIGH (ref 11.7–15.4)
WBC: 4.2 x10E3/uL (ref 3.4–10.8)

## 2024-09-07 LAB — IRON AND TIBC
Iron Saturation: 19 % (ref 15–55)
Iron: 52 ug/dL (ref 27–139)
Total Iron Binding Capacity: 278 ug/dL (ref 250–450)
UIBC: 226 ug/dL (ref 118–369)

## 2024-09-07 LAB — FERRITIN: Ferritin: 186 ng/mL — ABNORMAL HIGH (ref 15–150)

## 2024-09-07 LAB — LIPID PANEL W/O CHOL/HDL RATIO
Cholesterol, Total: 147 mg/dL (ref 100–199)
HDL: 51 mg/dL (ref >39)
LDL Chol Calc (NIH): 76 mg/dL (ref 0–99)
Triglycerides: 109 mg/dL (ref 0–149)
VLDL Cholesterol Cal: 20 mg/dL (ref 5–40)

## 2024-09-07 LAB — TSH: TSH: 1.97 u[IU]/mL (ref 0.450–4.500)

## 2024-09-19 ENCOUNTER — Ambulatory Visit: Admitting: Family Medicine

## 2024-10-10 ENCOUNTER — Other Ambulatory Visit: Payer: Self-pay | Admitting: Family Medicine

## 2024-10-10 NOTE — Telephone Encounter (Unsigned)
 Copied from CRM [id number]. Topic: Clinical - Medication Refill >> Oct 10, 2024  2:20 PM Montie POUR wrote: Medication:  lisinopril -hydrochlorothiazide  (ZESTORETIC ) 20-25 MG tablet amLODipine  (NORVASC ) 5 MG tablet rosuvastatin  (CRESTOR ) 5 MG tablet  Has the patient contacted their pharmacy? Yes (Agent: If no, request that the patient contact the pharmacy for the refill. If patient does not wish to contact the pharmacy document the reason why and proceed with request.) (Agent: If yes, when and what did the pharmacy advise?) Pharmacy needs order to refill  This is the patient's preferred pharmacy:  [facility] [address]), [place] - [address] [place]) [place] [address] Phone: [phone] Fax: [phone]  Is this the correct pharmacy for this prescription? Yes If no, delete pharmacy and type the correct one.   Has the prescription been filled recently? No  Is the patient out of the medication? No  Has the patient been seen for an appointment in the last year OR does the patient have an upcoming appointment? Yes  Can we respond through MyChart? No  Agent: Please be advised that Rx refills may take up to 3 business days. We ask that you follow-up with your pharmacy.

## 2024-10-11 NOTE — Telephone Encounter (Signed)
 Refill requests are too early. Requested Prescriptions  Refused Prescriptions Disp Refills   lisinopril -hydrochlorothiazide  (ZESTORETIC ) 20-25 MG tablet 180 tablet 1    Sig: Take 1 tablet by mouth 2 (two) times daily.     Cardiovascular:  ACEI + Diuretic Combos Failed - 10/11/2024  3:34 PM      Failed - Last BP in normal range    BP Readings from Last 1 Encounters:  09/06/24 (!) 160/80         Passed - Na in normal range and within 180 days    Sodium  Date Value Ref Range Status  09/06/2024 138 134 - 144 mmol/L Final         Passed - K in normal range and within 180 days    Potassium  Date Value Ref Range Status  09/06/2024 3.5 3.5 - 5.2 mmol/L Final         Passed - Cr in normal range and within 180 days    Creatinine  Date Value Ref Range Status  06/23/2023 0.85 0.44 - 1.00 mg/dL Final   Creatinine, Ser  Date Value Ref Range Status  09/06/2024 0.82 0.57 - 1.00 mg/dL Final         Passed - eGFR is 30 or above and within 180 days    GFR calc Af Amer  Date Value Ref Range Status  10/28/2020 76 >59 mL/min/1.73 Final    Comment:    **In accordance with recommendations from the NKF-ASN Task force,**   [facility] is in the process of updating its eGFR calculation to the   2021 CKD-EPI creatinine equation that estimates kidney function   without a race variable.    GFR, Estimated  Date Value Ref Range Status  04/26/2024 >60 >60 mL/min Final    Comment:    (NOTE) Calculated using the CKD-EPI Creatinine Equation (2021)   06/23/2023 >60 >60 mL/min Final    Comment:    (NOTE) Calculated using the CKD-EPI Creatinine Equation (2021)    eGFR  Date Value Ref Range Status  09/06/2024 79 >59 mL/min/1.73 Final         Passed - Patient is not pregnant      Passed - Valid encounter within last 6 months    Recent Outpatient Visits           1 month ago Routine general medical examination at a health care facility   [facility], [place] P,  DO   2 months ago COPD exacerbation [facility])   [place] [facility] [place], Megan P, DO   5 months ago Chronic bronchitis, unspecified chronic bronchitis type [facility])   [place] [facility] [place], Megan P, DO   8 months ago Benign hypertensive renal disease   [place] [facility] [place], Megan P, DO   11 months ago Hordeolum externum of left lower eyelid   [place] [facility] Herold Hadassah SQUIBB, MD       Future Appointments             In 8 months McGowan, Clotilda LABOR, PA-C [facility] [place]             amLODipine  (NORVASC ) 5 MG tablet 90 tablet 0    Sig: Take 1 tablet (5 mg total) by mouth daily.     Cardiovascular: Calcium  Channel Blockers 2 Failed - 10/11/2024  3:34 PM      Failed - Last BP in normal range  BP Readings from Last 1 Encounters:  09/06/24 (!) 160/80         Passed - Last Heart Rate in normal range    Pulse Readings from Last 1 Encounters:  09/06/24 (!) 48         Passed - Valid encounter within last 6 months    Recent Outpatient Visits           1 month ago Routine general medical examination at a health care facility   [facility] [place], [place] P, DO   2 months ago COPD exacerbation [facility])   [place] [facility] [place], Megan P, DO   5 months ago Chronic bronchitis, unspecified chronic bronchitis type [facility])   [place] [facility] [place], Megan P, DO   8 months ago Benign hypertensive renal disease   [place] [facility] [facility] [place], Megan P, DO   11 months ago Hordeolum externum of left lower eyelid   [place] [facility] Herold Hadassah SQUIBB, MD       Future Appointments             In 8 months McGowan, Clotilda LABOR, PA-C [facility] [place]             rosuvastatin  (CRESTOR ) 5 MG tablet 90 tablet 1    Sig: Take 1 tablet (5 mg total) by mouth daily.     Cardiovascular:   Antilipid - Statins 2 Failed - 10/11/2024  3:34 PM      Failed - Lipid Panel in normal range within the last 12 months    Cholesterol, Total  Date Value Ref Range Status  09/06/2024 147 100 - 199 mg/dL Final   LDL Chol Calc (NIH)  Date Value Ref Range Status  09/06/2024 76 0 - 99 mg/dL Final   HDL  Date Value Ref Range Status  09/06/2024 51 >39 mg/dL Final   Triglycerides  Date Value Ref Range Status  09/06/2024 109 0 - 149 mg/dL Final         Passed - Cr in normal range and within 360 days    Creatinine  Date Value Ref Range Status  06/23/2023 0.85 0.44 - 1.00 mg/dL Final   Creatinine, Ser  Date Value Ref Range Status  09/06/2024 0.82 0.57 - 1.00 mg/dL Final         Passed - Patient is not pregnant      Passed - Valid encounter within last 12 months    Recent Outpatient Visits           1 month ago Routine general medical examination at a health care facility   [facility] [place], Megan P, DO   2 months ago COPD exacerbation [facility])   [place] [facility] [place], Megan P, DO   5 months ago Chronic bronchitis, unspecified chronic bronchitis type [facility])   [facility] [place], Megan P, DO   8 months ago Benign hypertensive renal disease   [place] [facility] [place], Megan P, DO   11 months ago Hordeolum externum of left lower eyelid   [place] [facility] Herold Hadassah SQUIBB, MD       Future Appointments             In 8 months McGowan, Clotilda LABOR [facility] 

## 2024-10-17 ENCOUNTER — Other Ambulatory Visit: Payer: Self-pay | Admitting: Family Medicine

## 2024-10-17 DIAGNOSIS — Z1231 Encounter for screening mammogram for malignant neoplasm of breast: Secondary | ICD-10-CM

## 2024-10-29 ENCOUNTER — Ambulatory Visit: Admitting: Family Medicine

## 2024-11-06 ENCOUNTER — Ambulatory Visit: Admitting: Family Medicine

## 2024-11-22 ENCOUNTER — Encounter

## 2024-11-27 ENCOUNTER — Ambulatory Visit: Payer: Medicare HMO

## 2025-04-26 ENCOUNTER — Ambulatory Visit

## 2025-04-26 ENCOUNTER — Other Ambulatory Visit

## 2025-04-26 ENCOUNTER — Ambulatory Visit: Admitting: Internal Medicine

## 2025-06-20 ENCOUNTER — Ambulatory Visit: Admitting: Urology
